# Patient Record
Sex: Female | Born: 1935
Health system: Southern US, Community
[De-identification: ages and names within clinical notes are randomized; demographics above are authoritative.]

## PROBLEM LIST (undated history)

## (undated) ENCOUNTER — Emergency Department (HOSPITAL_COMMUNITY): Discharge status: Absent without leave | Payer: Medicare HMO

## (undated) DIAGNOSIS — I2510 Atherosclerotic heart disease of native coronary artery without angina pectoris: Secondary | ICD-10-CM

## (undated) DIAGNOSIS — N289 Disorder of kidney and ureter, unspecified: Secondary | ICD-10-CM

## (undated) DIAGNOSIS — Z8719 Personal history of other diseases of the digestive system: Secondary | ICD-10-CM

## (undated) DIAGNOSIS — N189 Chronic kidney disease, unspecified: Secondary | ICD-10-CM

## (undated) DIAGNOSIS — I499 Cardiac arrhythmia, unspecified: Secondary | ICD-10-CM

## (undated) DIAGNOSIS — M1990 Unspecified osteoarthritis, unspecified site: Secondary | ICD-10-CM

## (undated) DIAGNOSIS — R42 Dizziness and giddiness: Secondary | ICD-10-CM

## (undated) DIAGNOSIS — E039 Hypothyroidism, unspecified: Secondary | ICD-10-CM

## (undated) DIAGNOSIS — G4730 Sleep apnea, unspecified: Secondary | ICD-10-CM

## (undated) DIAGNOSIS — D509 Iron deficiency anemia, unspecified: Secondary | ICD-10-CM

## (undated) DIAGNOSIS — I10 Essential (primary) hypertension: Secondary | ICD-10-CM

## (undated) DIAGNOSIS — D649 Anemia, unspecified: Secondary | ICD-10-CM

## (undated) DIAGNOSIS — R251 Tremor, unspecified: Secondary | ICD-10-CM

## (undated) DIAGNOSIS — F32A Depression, unspecified: Secondary | ICD-10-CM

## (undated) DIAGNOSIS — F329 Major depressive disorder, single episode, unspecified: Secondary | ICD-10-CM

## (undated) DIAGNOSIS — F419 Anxiety disorder, unspecified: Secondary | ICD-10-CM

## (undated) DIAGNOSIS — R0600 Dyspnea, unspecified: Secondary | ICD-10-CM

## (undated) DIAGNOSIS — K219 Gastro-esophageal reflux disease without esophagitis: Secondary | ICD-10-CM

## (undated) DIAGNOSIS — I219 Acute myocardial infarction, unspecified: Secondary | ICD-10-CM

## (undated) DIAGNOSIS — H9190 Unspecified hearing loss, unspecified ear: Secondary | ICD-10-CM

## (undated) HISTORY — DX: Essential (primary) hypertension: I10

## (undated) HISTORY — PX: OTHER SURGICAL HISTORY: SHX169

## (undated) HISTORY — DX: Iron deficiency anemia, unspecified: D50.9

## (undated) HISTORY — PX: JOINT REPLACEMENT: SHX530

## (undated) HISTORY — PX: REPLACEMENT TOTAL KNEE BILATERAL: SUR1225

## (undated) HISTORY — DX: Anemia, unspecified: D64.9

## (undated) HISTORY — PX: CHOLECYSTECTOMY: SHX55

## (undated) HISTORY — DX: Chronic kidney disease, unspecified: N18.9

---

## 1990-08-19 DIAGNOSIS — I219 Acute myocardial infarction, unspecified: Secondary | ICD-10-CM

## 1990-08-19 HISTORY — DX: Acute myocardial infarction, unspecified: I21.9

## 2004-05-22 ENCOUNTER — Ambulatory Visit: Payer: Self-pay | Admitting: Family Medicine

## 2004-12-20 ENCOUNTER — Ambulatory Visit: Payer: Self-pay | Admitting: General Surgery

## 2005-11-04 ENCOUNTER — Ambulatory Visit: Payer: Self-pay | Admitting: Family Medicine

## 2006-12-17 ENCOUNTER — Inpatient Hospital Stay: Payer: Self-pay | Admitting: Internal Medicine

## 2006-12-17 ENCOUNTER — Other Ambulatory Visit: Payer: Self-pay

## 2007-03-18 ENCOUNTER — Ambulatory Visit: Payer: Self-pay | Admitting: Family Medicine

## 2007-03-30 ENCOUNTER — Ambulatory Visit: Payer: Self-pay | Admitting: Family Medicine

## 2007-08-21 ENCOUNTER — Ambulatory Visit: Payer: Self-pay | Admitting: General Practice

## 2007-09-02 ENCOUNTER — Inpatient Hospital Stay: Payer: Self-pay | Admitting: General Practice

## 2008-02-16 ENCOUNTER — Other Ambulatory Visit: Payer: Self-pay

## 2008-02-16 ENCOUNTER — Emergency Department: Payer: Self-pay | Admitting: Emergency Medicine

## 2008-02-17 ENCOUNTER — Ambulatory Visit: Payer: Self-pay | Admitting: Family Medicine

## 2008-02-18 ENCOUNTER — Ambulatory Visit: Payer: Self-pay | Admitting: Family Medicine

## 2008-03-23 ENCOUNTER — Ambulatory Visit: Payer: Self-pay | Admitting: Family Medicine

## 2008-07-04 ENCOUNTER — Ambulatory Visit: Payer: Self-pay | Admitting: Internal Medicine

## 2008-12-17 ENCOUNTER — Emergency Department: Payer: Self-pay | Admitting: Emergency Medicine

## 2009-03-28 ENCOUNTER — Ambulatory Visit: Payer: Self-pay | Admitting: Family Medicine

## 2009-05-23 ENCOUNTER — Emergency Department: Payer: Self-pay | Admitting: Internal Medicine

## 2009-07-20 ENCOUNTER — Ambulatory Visit: Payer: Self-pay | Admitting: Internal Medicine

## 2009-07-25 ENCOUNTER — Ambulatory Visit: Payer: Self-pay | Admitting: Internal Medicine

## 2009-10-10 ENCOUNTER — Emergency Department: Payer: Self-pay | Admitting: Emergency Medicine

## 2010-01-02 ENCOUNTER — Ambulatory Visit: Payer: Self-pay | Admitting: Internal Medicine

## 2010-01-19 ENCOUNTER — Ambulatory Visit: Payer: Self-pay | Admitting: General Surgery

## 2010-01-31 ENCOUNTER — Ambulatory Visit: Payer: Self-pay | Admitting: Otolaryngology

## 2010-03-27 ENCOUNTER — Inpatient Hospital Stay: Payer: Self-pay | Admitting: Internal Medicine

## 2010-05-09 ENCOUNTER — Encounter: Payer: Self-pay | Admitting: Internal Medicine

## 2010-05-19 ENCOUNTER — Encounter: Payer: Self-pay | Admitting: Internal Medicine

## 2010-05-29 ENCOUNTER — Ambulatory Visit: Payer: Self-pay | Admitting: Internal Medicine

## 2010-06-19 ENCOUNTER — Ambulatory Visit: Payer: Self-pay | Admitting: Internal Medicine

## 2010-06-19 ENCOUNTER — Encounter: Payer: Self-pay | Admitting: Internal Medicine

## 2011-01-31 ENCOUNTER — Ambulatory Visit: Payer: Self-pay | Admitting: Internal Medicine

## 2011-05-21 ENCOUNTER — Encounter: Payer: Self-pay | Admitting: Internal Medicine

## 2011-05-21 ENCOUNTER — Ambulatory Visit (INDEPENDENT_AMBULATORY_CARE_PROVIDER_SITE_OTHER): Payer: 59 | Admitting: Internal Medicine

## 2011-05-21 VITALS — BP 145/84 | HR 71 | Temp 98.5°F | Resp 20 | Ht 72.0 in | Wt 290.5 lb

## 2011-05-21 DIAGNOSIS — F329 Major depressive disorder, single episode, unspecified: Secondary | ICD-10-CM | POA: Insufficient documentation

## 2011-05-21 DIAGNOSIS — H9190 Unspecified hearing loss, unspecified ear: Secondary | ICD-10-CM | POA: Insufficient documentation

## 2011-05-21 DIAGNOSIS — Z0000 Encounter for general adult medical examination without abnormal findings: Secondary | ICD-10-CM

## 2011-05-21 DIAGNOSIS — I10 Essential (primary) hypertension: Secondary | ICD-10-CM

## 2011-05-21 LAB — LIPID PANEL
Cholesterol: 212 mg/dL — ABNORMAL HIGH (ref 0–200)
HDL: 43.6 mg/dL (ref >39.00)
Triglycerides: 112 mg/dL (ref 0.0–149.0)

## 2011-05-21 LAB — CBC WITH DIFFERENTIAL/PLATELET
Eosinophils Absolute: 0.4 10*3/uL (ref 0.0–0.7)
HCT: 35.9 % — ABNORMAL LOW (ref 36.0–46.0)
Lymphs Abs: 1.4 10*3/uL (ref 0.7–4.0)
MCHC: 32.5 g/dL (ref 30.0–36.0)
MCV: 84.1 fl (ref 78.0–100.0)
Monocytes Absolute: 0.5 10*3/uL (ref 0.1–1.0)
Neutrophils Relative %: 60.7 % (ref 43.0–77.0)
Platelets: 206 10*3/uL (ref 150.0–400.0)
RDW: 15.6 % — ABNORMAL HIGH (ref 11.5–14.6)
WBC: 5.8 10*3/uL (ref 4.5–10.5)

## 2011-05-21 LAB — COMPREHENSIVE METABOLIC PANEL
ALT: 13 U/L (ref 0–35)
Calcium: 9.1 mg/dL (ref 8.4–10.5)
GFR: 50.03 mL/min — ABNORMAL LOW (ref >60.00)
Glucose, Bld: 102 mg/dL — ABNORMAL HIGH (ref 70–99)
Potassium: 4.3 mEq/L (ref 3.5–5.1)
Sodium: 144 mEq/L (ref 135–145)
Total Bilirubin: 0.4 mg/dL (ref 0.3–1.2)

## 2011-05-21 MED ORDER — LOSARTAN POTASSIUM 100 MG PO TABS: 100.0000 mg | ORAL_TABLET | Freq: Every day | ORAL | Status: DC

## 2011-05-21 MED ORDER — CITALOPRAM HYDROBROMIDE 10 MG PO TABS: 10.0000 mg | ORAL_TABLET | Freq: Every day | ORAL | Status: DC

## 2011-05-21 NOTE — Patient Instructions (Signed)
Labwork today. Mammogram to be scheduled. Start new medication, Celexa 10mg  daily. Follow up in 1 month or sooner if any concerns.

## 2011-05-21 NOTE — Progress Notes (Signed)
Subjective:    Nicole Bishop is a 77 y.o. female who presents for Medicare Annual/Subsequent preventive examination.  Preventive Screening-Counseling & Management  Tobacco History  Smoking status  . Former Smoker  Smokeless tobacco  . Never Used     Problems Prior to Visit 1. Hypertension  Current Medications (verified) Current Outpatient Prescriptions  Medication Sig Dispense Refill  . amLODipine (NORVASC) 10 MG tablet Take 10 mg by mouth daily.        Marland Kitchen aspirin 81 MG tablet Take 81 mg by mouth every other day.        . esomeprazole (NEXIUM) 40 MG capsule Take 40 mg by mouth daily before breakfast.        . ferrous sulfate 325 (65 FE) MG tablet Take 325 mg by mouth 3 (three) times daily with meals.        . hydrochlorothiazide (HYDRODIURIL) 25 MG tablet Take 25 mg by mouth daily.        Marland Kitchen labetalol (NORMODYNE) 100 MG tablet Take 100 mg by mouth at bedtime. 1/2 tablet at bedtime       . loratadine (CLARITIN) 10 MG tablet Take 10 mg by mouth daily.        Marland Kitchen losartan (COZAAR) 100 MG tablet Take 100 mg by mouth daily.        . mometasone (NASONEX) 50 MCG/ACT nasal spray Place 2 sprays into the nose daily.        Marland Kitchen olopatadine (PATANOL) 0.1 % ophthalmic solution Place 1 drop into both eyes 2 (two) times daily. At 6-8 hours intervals          Allergies (verified) Celebrex and Penicillins cross reactors   PAST HISTORY  Family History Arthritis, hypertension  Social History History  Substance Use Topics  . Smoking status: Former Research scientist (life sciences)  . Smokeless tobacco: Never Used  . Alcohol Use: No     Are there smokers in your home (other than you)? No  Risk Factors Current exercise habits: The patient does not participate in regular exercise at present.  Dietary issues discussed: regular diet, increased fruits and vegetables   Cardiac risk factors: advanced age (older than 31 for men, 59 for women), hypertension and sedentary lifestyle.  Depression Screen (Note: if answer to  either of the following is "Yes", a more complete depression screening is indicated)   Over the past two weeks, have you felt down, depressed or hopeless? Yes  Over the past two weeks, have you felt little interest or pleasure in doing things? Yes  Have you lost interest or pleasure in daily life? Yes  Do you often feel hopeless? Yes  Do you cry easily over simple problems? No  Activities of Daily Living In your present state of health, do you have any difficulty performing the following activities?:  Driving? Yes Managing money?  No Feeding yourself? No Getting from bed to chair? No Climbing a flight of stairs? No Preparing food and eating?: No Bathing or showering? No Getting dressed: No Getting to the toilet? No Using the toilet:No Moving around from place to place: No In the past year have you fallen or had a near fall?:Yes, once in last year   Are you sexually active?  No  Hearing Difficulties: Yes Do you often ask people to speak up or repeat themselves? Yes Do you experience ringing or noises in your ears? No Do you have difficulty understanding soft or whispered voices? Yes   Do you feel that you have a problem  with memory? Yes  Do you often misplace items? No  Do you feel safe at home?  Yes  Cognitive Testing  Alert? Yes  Normal Appearance?Yes  Oriented to person? Yes  Place? Yes   Time? Yes  Recall of three objects?  Yes  Can perform simple calculations? Yes  Displays appropriate judgment?Yes  Can read the correct time from a watch face?Yes   Advanced Directives have been discussed with the patient? Yes    All answers were reviewed with the patient and necessary referrals were made: audiology for hearing testing, offered PT for strength training but pt refused.  Jackolyn Confer, MD   05/21/2011   History reviewed: allergies, current medications, past family history, past medical history, past social history, past surgical history and problem list  Review  of Systems A comprehensive review of systems was negative except for: Behavioral/Psych: positive for depression  and mild fatigue, which has been longstanding.   Objective:       Body mass index is 39.40 kg/(m^2). BP 145/84  Pulse 71  Temp(Src) 98.5 F (36.9 C) (Oral)  Resp 20  Ht 6' (1.829 m)  Wt 290 lb 8 oz (131.77 kg)  BMI 39.40 kg/m2  SpO2 94%    General Appearance:    Alert, cooperative, no distress, appears stated age  Head:    Normocephalic, without obvious abnormality, atraumatic  Eyes:    PERRL, conjunctiva/corneas clear, EOM's intact, fundi    benign, both eyes  Ears:    Normal TM's and external ear canals, both ears  Nose:   Nares normal, septum midline, mucosa normal, no drainage    or sinus tenderness  Throat:   Lips, mucosa, and tongue normal; teeth and gums normal  Neck:   Supple, symmetrical, trachea midline, no adenopathy;    thyroid:  no enlargement/tenderness/nodules; no carotid   bruit or JVD  Back:     Symmetric, no curvature, ROM normal, no CVA tenderness  Lungs:     Clear to auscultation bilaterally, respirations unlabored  Chest Wall:    No tenderness or deformity   Heart:    Regular rate and rhythm, S1 and S2 normal, no murmur, rub   or gallop  Breast Exam:    No tenderness, masses, or nipple abnormality  Abdomen:     Soft, non-tender, bowel sounds active all four quadrants,    no masses, no organomegaly        Extremities:   Extremities normal, atraumatic, no cyanosis or edema  Pulses:   2+ and symmetric all extremities  Skin:   Skin color, texture, turgor normal, no rashes or lesions  Lymph nodes:   Cervical, supraclavicular, and axillary nodes normal  Neurologic:   CNII-XII intact, normal strength, sensation and reflexes    throughout       Assessment and Plan:     1. Hypertension - BP fairly well controlled historically, slightly elevated today. No change in meds today. Will check renal function with labs today. Follow up in 1  month.  2. Depression - Identified on screen. Will start Celexa 10mg  daily. RTC in 80month.  3. Hearing loss - Identified on screen. Will refer to audiology for hearing testing.  4. Health Maintenance - Influenza vaccine refused today. Will check lipids, CBC,CMP with labs.     Diet review for nutrition referral?  Not Indicated    Patient Instructions (the written plan) was given to the patient.  Medicare Attestation I have personally reviewed: The patient's medical and social history  Their use of alcohol, tobacco or illicit drugs Their current medications and supplements The patient's functional ability including ADLs,fall risks, home safety risks, cognitive, and hearing and visual impairment Diet and physical activities Evidence for depression or mood disorders  The patient's weight, height, BMI, and visual acuity have been recorded in the chart.  I have made referrals, counseling, and provided education to the patient based on review of the above and I have provided the patient with a written personalized care plan for preventive services.     Jackolyn Confer, MD   05/21/2011

## 2011-06-24 ENCOUNTER — Ambulatory Visit: Payer: 59 | Admitting: Internal Medicine

## 2011-07-04 ENCOUNTER — Ambulatory Visit: Payer: Self-pay | Admitting: Internal Medicine

## 2011-07-08 ENCOUNTER — Encounter: Payer: Self-pay | Admitting: Internal Medicine

## 2011-07-20 ENCOUNTER — Encounter: Payer: Self-pay | Admitting: Internal Medicine

## 2011-08-14 ENCOUNTER — Ambulatory Visit: Payer: Self-pay | Admitting: Internal Medicine

## 2011-08-15 ENCOUNTER — Encounter: Payer: Self-pay | Admitting: Internal Medicine

## 2011-08-20 ENCOUNTER — Encounter: Payer: Self-pay | Admitting: Internal Medicine

## 2011-08-29 ENCOUNTER — Ambulatory Visit: Payer: Self-pay | Admitting: Physical Medicine and Rehabilitation

## 2011-10-31 DIAGNOSIS — E782 Mixed hyperlipidemia: Secondary | ICD-10-CM | POA: Insufficient documentation

## 2011-10-31 DIAGNOSIS — D649 Anemia, unspecified: Secondary | ICD-10-CM | POA: Insufficient documentation

## 2011-11-04 DIAGNOSIS — M549 Dorsalgia, unspecified: Secondary | ICD-10-CM | POA: Insufficient documentation

## 2011-11-04 DIAGNOSIS — E559 Vitamin D deficiency, unspecified: Secondary | ICD-10-CM | POA: Insufficient documentation

## 2011-11-08 ENCOUNTER — Other Ambulatory Visit: Payer: Self-pay | Admitting: *Deleted

## 2011-12-03 ENCOUNTER — Other Ambulatory Visit: Payer: Self-pay | Admitting: *Deleted

## 2011-12-03 MED ORDER — LABETALOL HCL 100 MG PO TABS: 50.0000 mg | ORAL_TABLET | Freq: Every day | ORAL | Status: DC

## 2012-01-24 ENCOUNTER — Other Ambulatory Visit: Payer: Self-pay | Admitting: Internal Medicine

## 2012-01-24 MED ORDER — AMLODIPINE BESYLATE 10 MG PO TABS: 10.0000 mg | ORAL_TABLET | Freq: Every day | ORAL | Status: DC

## 2012-03-13 DIAGNOSIS — R42 Dizziness and giddiness: Secondary | ICD-10-CM | POA: Insufficient documentation

## 2012-03-17 ENCOUNTER — Ambulatory Visit: Payer: Self-pay | Admitting: Internal Medicine

## 2012-04-17 ENCOUNTER — Encounter: Payer: Self-pay | Admitting: Otolaryngology

## 2012-04-19 ENCOUNTER — Encounter: Payer: Self-pay | Admitting: Otolaryngology

## 2012-05-19 ENCOUNTER — Encounter: Payer: Self-pay | Admitting: Otolaryngology

## 2012-06-26 ENCOUNTER — Other Ambulatory Visit: Payer: Self-pay | Admitting: *Deleted

## 2012-06-26 NOTE — Telephone Encounter (Signed)
Last OV 10/12, please advise.

## 2012-06-28 MED ORDER — AMLODIPINE BESYLATE 10 MG PO TABS: 10.0000 mg | ORAL_TABLET | Freq: Every day | ORAL | Status: DC

## 2012-07-21 ENCOUNTER — Other Ambulatory Visit: Payer: Self-pay | Admitting: General Practice

## 2012-07-21 NOTE — Telephone Encounter (Signed)
Labetalol HCL refill request. Last filled on 4/16. Pt last seen on 05/21/11. Please advise.

## 2012-07-21 NOTE — Telephone Encounter (Signed)
Has she been taking this med? We can fill x 1 month. Needs follow up

## 2012-07-22 MED ORDER — LABETALOL HCL 100 MG PO TABS: 50.0000 mg | ORAL_TABLET | Freq: Every day | ORAL | Status: DC

## 2012-07-22 NOTE — Telephone Encounter (Signed)
Med filled.  

## 2012-08-04 ENCOUNTER — Other Ambulatory Visit: Payer: Self-pay | Admitting: Internal Medicine

## 2012-08-04 NOTE — Telephone Encounter (Signed)
Amlodipine Besylate 10 mg  Take one tablet by mouth every day  # 30

## 2012-08-05 MED ORDER — AMLODIPINE BESYLATE 10 MG PO TABS: 10.0000 mg | ORAL_TABLET | Freq: Every day | ORAL | Status: DC

## 2012-08-05 NOTE — Telephone Encounter (Signed)
We can refill x1 month. Needs follow up appt.

## 2012-08-05 NOTE — Telephone Encounter (Signed)
Med filled.  

## 2012-08-05 NOTE — Telephone Encounter (Signed)
Med last filled on 06/26/12 qty 30 w/ no refills. Pt has not bee seen since 05/31/11. Please advise.

## 2012-09-08 ENCOUNTER — Other Ambulatory Visit: Payer: Self-pay | Admitting: Internal Medicine

## 2012-09-08 MED ORDER — AMLODIPINE BESYLATE 10 MG PO TABS: 10.0000 mg | ORAL_TABLET | Freq: Every day | ORAL | Status: DC

## 2012-09-08 NOTE — Telephone Encounter (Signed)
Rx refilled.

## 2012-09-08 NOTE — Telephone Encounter (Signed)
Refill on Amlodipine Besylate 10 mg tab #30.

## 2012-09-30 ENCOUNTER — Other Ambulatory Visit: Payer: Self-pay | Admitting: *Deleted

## 2012-10-13 ENCOUNTER — Other Ambulatory Visit: Payer: Self-pay | Admitting: *Deleted

## 2012-11-06 ENCOUNTER — Other Ambulatory Visit: Payer: Self-pay | Admitting: *Deleted

## 2012-11-06 NOTE — Telephone Encounter (Signed)
Patient has not been seen 2012

## 2012-11-06 NOTE — Telephone Encounter (Signed)
Pt needs to be seen prior to refills. No office visits since 2012.

## 2012-11-13 ENCOUNTER — Other Ambulatory Visit: Payer: Self-pay | Admitting: *Deleted

## 2012-11-25 ENCOUNTER — Other Ambulatory Visit: Payer: Self-pay | Admitting: *Deleted

## 2012-11-25 NOTE — Telephone Encounter (Signed)
Pt needs an appt. Has not been seen since 2012.

## 2012-12-08 ENCOUNTER — Other Ambulatory Visit: Payer: Self-pay | Admitting: *Deleted

## 2012-12-08 ENCOUNTER — Ambulatory Visit: Payer: Self-pay | Admitting: Internal Medicine

## 2012-12-08 NOTE — Telephone Encounter (Signed)
No. Pt must have follow up

## 2012-12-08 NOTE — Telephone Encounter (Signed)
Patient has not been seen since 2012

## 2012-12-09 ENCOUNTER — Other Ambulatory Visit: Payer: Self-pay | Admitting: *Deleted

## 2012-12-10 ENCOUNTER — Ambulatory Visit: Payer: Self-pay | Admitting: Otolaryngology

## 2012-12-17 ENCOUNTER — Other Ambulatory Visit: Payer: Self-pay | Admitting: *Deleted

## 2012-12-29 ENCOUNTER — Other Ambulatory Visit: Payer: Self-pay | Admitting: *Deleted

## 2013-01-13 ENCOUNTER — Other Ambulatory Visit: Payer: Self-pay | Admitting: *Deleted

## 2013-02-04 ENCOUNTER — Other Ambulatory Visit: Payer: Self-pay | Admitting: Internal Medicine

## 2013-02-12 MED ORDER — LORATADINE 10 MG PO TABS: 10.0000 mg | ORAL_TABLET | Freq: Every day | ORAL | Status: DC

## 2013-02-12 NOTE — Addendum Note (Signed)
Addended by: Ronaldo Miyamoto on: 02/12/2013 02:41 PM   Modules accepted: Orders

## 2013-05-26 ENCOUNTER — Ambulatory Visit: Payer: Self-pay | Admitting: Otolaryngology

## 2013-05-31 ENCOUNTER — Other Ambulatory Visit: Payer: Self-pay | Admitting: *Deleted

## 2013-05-31 DIAGNOSIS — I10 Essential (primary) hypertension: Secondary | ICD-10-CM

## 2013-07-02 ENCOUNTER — Telehealth: Payer: Self-pay | Admitting: Internal Medicine

## 2013-07-02 NOTE — Telephone Encounter (Signed)
Pharmacy states pt continues to contact them regarding refills for losartan.  Pharmacy states they have faxed request several times with no response.  Pharmacy asking if pt is seen here by Dr. Gilford Rile as pt seems to be unclear.  Pharmacy asking for a call.

## 2013-07-05 NOTE — Telephone Encounter (Signed)
Spoke with pharmacist and informed her Nicole Bishop has not been seen here since 2012.

## 2013-12-27 DIAGNOSIS — E538 Deficiency of other specified B group vitamins: Secondary | ICD-10-CM | POA: Insufficient documentation

## 2014-01-27 DIAGNOSIS — M19019 Primary osteoarthritis, unspecified shoulder: Secondary | ICD-10-CM | POA: Insufficient documentation

## 2014-02-21 ENCOUNTER — Ambulatory Visit: Payer: Self-pay | Admitting: Internal Medicine

## 2014-04-04 ENCOUNTER — Other Ambulatory Visit: Payer: Self-pay | Admitting: Internal Medicine

## 2014-04-20 DIAGNOSIS — Z8639 Personal history of other endocrine, nutritional and metabolic disease: Secondary | ICD-10-CM | POA: Insufficient documentation

## 2014-04-20 DIAGNOSIS — K219 Gastro-esophageal reflux disease without esophagitis: Secondary | ICD-10-CM | POA: Insufficient documentation

## 2014-04-20 DIAGNOSIS — J45909 Unspecified asthma, uncomplicated: Secondary | ICD-10-CM | POA: Insufficient documentation

## 2014-05-03 DIAGNOSIS — I10 Essential (primary) hypertension: Secondary | ICD-10-CM | POA: Insufficient documentation

## 2014-06-03 DIAGNOSIS — K5909 Other constipation: Secondary | ICD-10-CM | POA: Insufficient documentation

## 2014-09-12 ENCOUNTER — Ambulatory Visit: Payer: Self-pay | Admitting: Hematology and Oncology

## 2014-09-12 LAB — IRON AND TIBC
Iron Bind.Cap.(Total): 487 ug/dL — ABNORMAL HIGH (ref 250–450)
Iron Saturation: 5 %
Iron: 23 ug/dL — ABNORMAL LOW (ref 50–170)
Unbound Iron-Bind.Cap.: 464 ug/dL

## 2014-09-12 LAB — FERRITIN: Ferritin (ARMC): 6 ng/mL — ABNORMAL LOW (ref 8–388)

## 2014-09-12 LAB — RETICULOCYTES
Absolute Retic Count: 0.0837 10*6/uL (ref 0.019–0.186)
Reticulocyte: 1.87 % (ref 0.4–3.1)

## 2014-09-12 LAB — CBC CANCER CENTER
Eosinophil: 1 %
HCT: 30 % — ABNORMAL LOW (ref 35.0–47.0)
HGB: 8.8 g/dL — ABNORMAL LOW (ref 12.0–16.0)
Lymphocytes: 27 %
MCH: 19.7 pg — ABNORMAL LOW (ref 26.0–34.0)
MCHC: 29.3 g/dL — ABNORMAL LOW (ref 32.0–36.0)
MCV: 67 fL — ABNORMAL LOW (ref 80–100)
Monocytes: 6 %
Platelet: 297 x10 3/mm (ref 150–440)
RBC: 4.47 10*6/uL (ref 3.80–5.20)
RDW: 18.5 % — ABNORMAL HIGH (ref 11.5–14.5)
Segmented Neutrophils: 66 %
WBC: 5 x10 3/mm (ref 3.6–11.0)

## 2014-09-12 LAB — FOLATE: Folic Acid: 16.7 ng/mL (ref 3.1–17.5)

## 2014-09-13 LAB — PROT IMMUNOELECTROPHORES(ARMC)

## 2014-09-19 ENCOUNTER — Ambulatory Visit: Payer: Self-pay | Admitting: Hematology and Oncology

## 2014-10-21 ENCOUNTER — Ambulatory Visit
Admit: 2014-10-21 | Disposition: A | Discharge status: Home / Self Care | Payer: Self-pay | Attending: Hematology and Oncology | Admitting: Hematology and Oncology

## 2014-11-18 ENCOUNTER — Ambulatory Visit
Admit: 2014-11-18 | Disposition: A | Discharge status: Home / Self Care | Payer: Self-pay | Attending: Hematology and Oncology | Admitting: Hematology and Oncology

## 2014-11-21 LAB — CBC CANCER CENTER
Basophil #: 0.1 x10 3/mm (ref 0.0–0.1)
Basophil %: 1.3 %
Eosinophil #: 0.1 x10 3/mm (ref 0.0–0.7)
Eosinophil %: 2.2 %
HCT: 34.7 % — ABNORMAL LOW (ref 35.0–47.0)
HGB: 10.7 g/dL — ABNORMAL LOW (ref 12.0–16.0)
Lymphocyte #: 1.1 x10 3/mm (ref 1.0–3.6)
Lymphocyte %: 25.1 %
MCH: 22.3 pg — ABNORMAL LOW (ref 26.0–34.0)
MCHC: 30.7 g/dL — ABNORMAL LOW (ref 32.0–36.0)
MCV: 73 fL — ABNORMAL LOW (ref 80–100)
Monocyte #: 0.4 x10 3/mm (ref 0.2–0.9)
Monocyte %: 8.2 %
Neutrophil #: 2.8 x10 3/mm (ref 1.4–6.5)
Neutrophil %: 63.2 %
Platelet: 196 x10 3/mm (ref 150–440)
RBC: 4.78 10*6/uL (ref 3.80–5.20)
RDW: 27.6 % — ABNORMAL HIGH (ref 11.5–14.5)
WBC: 4.4 x10 3/mm (ref 3.6–11.0)

## 2014-11-21 LAB — COMPREHENSIVE METABOLIC PANEL
Albumin: 3.9 g/dL
Alkaline Phosphatase: 74 U/L
Anion Gap: 7 (ref 7–16)
BUN: 15 mg/dL
Bilirubin,Total: 0.6 mg/dL
Calcium, Total: 9 mg/dL
Chloride: 105 mmol/L
Co2: 23 mmol/L
Creatinine: 1.08 mg/dL — ABNORMAL HIGH
EGFR (African American): 56 — ABNORMAL LOW
EGFR (Non-African Amer.): 48 — ABNORMAL LOW
Glucose: 102 mg/dL — ABNORMAL HIGH
Potassium: 4.1 mmol/L
SGOT(AST): 18 U/L
SGPT (ALT): 13 U/L — ABNORMAL LOW
Sodium: 135 mmol/L
Total Protein: 7.1 g/dL

## 2014-11-21 LAB — IRON AND TIBC
Iron Bind.Cap.(Total): 358 (ref 250–450)
Iron Saturation: 12.6
Iron: 45 ug/dL
Unbound Iron-Bind.Cap.: 313.1

## 2014-11-21 LAB — PROTIME-INR
INR: 1.1
Prothrombin Time: 14.3 secs

## 2014-11-21 LAB — APTT: Activated PTT: 26.9 secs (ref 23.6–35.9)

## 2014-11-21 LAB — FERRITIN: Ferritin (ARMC): 92 ng/mL

## 2014-11-30 ENCOUNTER — Ambulatory Visit
Admit: 2014-11-30 | Disposition: A | Discharge status: Home / Self Care | Payer: Self-pay | Attending: Internal Medicine | Admitting: Internal Medicine

## 2014-12-14 ENCOUNTER — Other Ambulatory Visit: Payer: Self-pay

## 2014-12-14 DIAGNOSIS — D509 Iron deficiency anemia, unspecified: Secondary | ICD-10-CM

## 2014-12-14 HISTORY — DX: Iron deficiency anemia, unspecified: D50.9

## 2014-12-21 ENCOUNTER — Other Ambulatory Visit: Payer: Medicaid Other

## 2014-12-22 DIAGNOSIS — D509 Iron deficiency anemia, unspecified: Secondary | ICD-10-CM | POA: Insufficient documentation

## 2014-12-22 DIAGNOSIS — N1831 Chronic kidney disease, stage 3a: Secondary | ICD-10-CM | POA: Insufficient documentation

## 2014-12-22 DIAGNOSIS — N183 Chronic kidney disease, stage 3 unspecified: Secondary | ICD-10-CM | POA: Insufficient documentation

## 2014-12-28 ENCOUNTER — Encounter (INDEPENDENT_AMBULATORY_CARE_PROVIDER_SITE_OTHER): Payer: Self-pay

## 2014-12-28 ENCOUNTER — Inpatient Hospital Stay: Payer: Medicare Other | Attending: Hematology and Oncology

## 2014-12-28 DIAGNOSIS — Z79899 Other long term (current) drug therapy: Secondary | ICD-10-CM | POA: Diagnosis not present

## 2014-12-28 DIAGNOSIS — D509 Iron deficiency anemia, unspecified: Secondary | ICD-10-CM | POA: Diagnosis present

## 2014-12-28 DIAGNOSIS — N183 Chronic kidney disease, stage 3 (moderate): Secondary | ICD-10-CM | POA: Insufficient documentation

## 2014-12-28 LAB — CBC WITH DIFFERENTIAL/PLATELET
Basophils Absolute: 0.1 10*3/uL (ref 0–0.1)
Basophils Relative: 1 %
Eosinophils Absolute: 0.1 10*3/uL (ref 0–0.7)
Eosinophils Relative: 2 %
HCT: 32.3 % — ABNORMAL LOW (ref 35.0–47.0)
Hemoglobin: 10.1 g/dL — ABNORMAL LOW (ref 12.0–16.0)
Lymphocytes Relative: 31 %
Lymphs Abs: 1.5 10*3/uL (ref 1.0–3.6)
MCH: 23.6 pg — ABNORMAL LOW (ref 26.0–34.0)
MCHC: 31.1 g/dL — ABNORMAL LOW (ref 32.0–36.0)
MCV: 75.7 fL — ABNORMAL LOW (ref 80.0–100.0)
Monocytes Absolute: 0.5 10*3/uL (ref 0.2–0.9)
Monocytes Relative: 9 %
Neutro Abs: 2.7 10*3/uL (ref 1.4–6.5)
Neutrophils Relative %: 57 %
Platelets: 209 10*3/uL (ref 150–440)
RBC: 4.27 MIL/uL (ref 3.80–5.20)
RDW: 24.4 % — ABNORMAL HIGH (ref 11.5–14.5)
WBC: 4.8 10*3/uL (ref 3.6–11.0)

## 2014-12-28 LAB — FERRITIN: Ferritin: 14 ng/mL (ref 11–307)

## 2015-02-22 ENCOUNTER — Other Ambulatory Visit: Payer: Self-pay

## 2015-02-22 DIAGNOSIS — D509 Iron deficiency anemia, unspecified: Secondary | ICD-10-CM

## 2015-02-23 ENCOUNTER — Inpatient Hospital Stay (HOSPITAL_BASED_OUTPATIENT_CLINIC_OR_DEPARTMENT_OTHER): Payer: Medicare Other | Admitting: Hematology and Oncology

## 2015-02-23 ENCOUNTER — Encounter: Payer: Self-pay | Admitting: Hematology and Oncology

## 2015-02-23 ENCOUNTER — Inpatient Hospital Stay: Payer: Medicare Other | Attending: Hematology and Oncology

## 2015-02-23 VITALS — BP 127/75 | HR 71 | Temp 96.1°F | Ht 73.0 in | Wt 275.6 lb

## 2015-02-23 DIAGNOSIS — D509 Iron deficiency anemia, unspecified: Secondary | ICD-10-CM

## 2015-02-23 DIAGNOSIS — Z79899 Other long term (current) drug therapy: Secondary | ICD-10-CM | POA: Insufficient documentation

## 2015-02-23 DIAGNOSIS — N183 Chronic kidney disease, stage 3 (moderate): Secondary | ICD-10-CM

## 2015-02-23 DIAGNOSIS — I129 Hypertensive chronic kidney disease with stage 1 through stage 4 chronic kidney disease, or unspecified chronic kidney disease: Secondary | ICD-10-CM | POA: Diagnosis not present

## 2015-02-23 LAB — CBC WITH DIFFERENTIAL/PLATELET
Basophils Absolute: 0 10*3/uL (ref 0–0.1)
Basophils Relative: 1 %
Eosinophils Absolute: 0.1 10*3/uL (ref 0–0.7)
Eosinophils Relative: 2 %
HCT: 34.9 % — ABNORMAL LOW (ref 35.0–47.0)
Hemoglobin: 10.9 g/dL — ABNORMAL LOW (ref 12.0–16.0)
Lymphocytes Relative: 33 %
Lymphs Abs: 1.5 10*3/uL (ref 1.0–3.6)
MCH: 24.3 pg — ABNORMAL LOW (ref 26.0–34.0)
MCHC: 31.2 g/dL — ABNORMAL LOW (ref 32.0–36.0)
MCV: 77.9 fL — ABNORMAL LOW (ref 80.0–100.0)
Monocytes Absolute: 0.4 10*3/uL (ref 0.2–0.9)
Monocytes Relative: 8 %
Neutro Abs: 2.5 10*3/uL (ref 1.4–6.5)
Neutrophils Relative %: 56 %
Platelets: 234 10*3/uL (ref 150–440)
RBC: 4.48 MIL/uL (ref 3.80–5.20)
RDW: 16.6 % — ABNORMAL HIGH (ref 11.5–14.5)
WBC: 4.5 10*3/uL (ref 3.6–11.0)

## 2015-02-23 LAB — IRON AND TIBC
Iron: 29 ug/dL (ref 28–170)
Saturation Ratios: 6 % — ABNORMAL LOW (ref 10.4–31.8)
TIBC: 463 ug/dL — ABNORMAL HIGH (ref 250–450)
UIBC: 434 ug/dL

## 2015-02-23 LAB — FERRITIN: Ferritin: 9 ng/mL — ABNORMAL LOW (ref 11–307)

## 2015-02-23 NOTE — Progress Notes (Signed)
Pt here today for follow up appointment; states she feels dizzy when she eats otherwise no other complaints

## 2015-02-23 NOTE — Progress Notes (Signed)
[facility] day:  02/23/2015  Chief Complaint: Nicole Bishop is a 81 y.o. female with stage III chronic kidney disease and iron deficiency who is seen for 3 month assessment.  HPI: The patient was last seen in the medical oncology clinic on 11/21/2014.  At that time, she felt "pretty good".  Hematocrit had improved to 34.7.  Ferritin was 92.  Exam revealed only ankle edema.  During the interim, she has felt "ok".  She still feels weak and tired.  She is eating more iron rich foods.   Past Medical History  Diagnosis Date  . Hypertension   . Chronic kidney disease   . Anemia   . IDA (iron deficiency anemia) 12/14/2014    Past Surgical History  Procedure Laterality Date  . Replacement total knee bilateral Bilateral   . Gallbladdedr removed    . Fatty tumor removed on left shoulder      Family History  Problem Relation Age of Onset  . Arthritis Mother   . Hypertension Mother   . Hypertension Father     Social History:  reports that she has quit smoking. She has never used smokeless tobacco. She reports that she does not drink alcohol or use illicit drugs.  The patient is accompanied by her daughter, Nicole Bishop, today.  Allergies:  Allergies  Allergen Reactions  . Penicillins Rash  . Celebrex [Celecoxib] Other (See Comments)    constipation  . Felodipine Nausea Only  . Oxaprozin Nausea Only  . Penicillins Cross Reactors   . Rofecoxib Other (See Comments)    GI upset  . Atorvastatin Rash    Current Medications: Current Outpatient Prescriptions  Medication Sig Dispense Refill  . amLODipine (NORVASC) 10 MG tablet Take 1 tablet (10 mg total) by mouth daily. Pt needs OV for more refills. 30 tablet 0  . aspirin 81 MG tablet Take 81 mg by mouth every other day.      . docusate sodium (COLACE) 100 MG capsule Take by mouth.    . esomeprazole (NEXIUM) 40 MG capsule Take 40 mg by mouth daily before breakfast.      . hydrochlorothiazide  (HYDRODIURIL) 25 MG tablet Take 25 mg by mouth daily.      Marland Kitchen labetalol (NORMODYNE) 100 MG tablet Take 0.5 tablets (50 mg total) by mouth at bedtime. 15 tablet 0  . loratadine (CLARITIN) 10 MG tablet Take 1 tablet (10 mg total) by mouth daily. 30 tablet 0  . losartan (COZAAR) 100 MG tablet Take 1 tablet (100 mg total) by mouth daily. 30 tablet 11  . mometasone (NASONEX) 50 MCG/ACT nasal spray Place 2 sprays into the nose daily.      Marland Kitchen olopatadine (PATANOL) 0.1 % ophthalmic solution Place 1 drop into both eyes 2 (two) times daily. At 6-8 hours intervals     . Cholecalciferol (VITAMIN D3) 1000 UNITS CAPS Take by mouth.    . citalopram (CELEXA) 10 MG tablet Take 1 tablet (10 mg total) by mouth daily. 30 tablet 2  . ferrous sulfate 325 (65 FE) MG tablet Take 325 mg by mouth 3 (three) times daily with meals.       No current facility-administered medications for this visit.    Review of Systems:  GENERAL:  Feels "ok".  Still tired.  No fevers, sweats or weight loss. PERFORMANCE STATUS (ECOG):  1 HEENT:  No visual changes, runny nose, sore throat, mouth sores or tenderness. Lungs: No shortness of breath or  cough.  No hemoptysis. Cardiac:  No chest pain, palpitations, orthopnea, or PND. GI:  No nausea, vomiting, diarrhea, constipation, melena or hematochezia. GU:  No urgency, frequency, dysuria, or hematuria. Musculoskeletal:  No back pain.  No joint pain.  No muscle tenderness. Extremities:  No pain or swelling. Skin:  No rashes or skin changes. Neuro:  No headache, numbness or weakness, balance or coordination issues. Endocrine:  No diabetes, thyroid issues, hot flashes or night sweats. Psych:  No mood changes, depression or anxiety. Pain:  No focal pain. Review of systems:  All other systems reviewed and found to be negative.   Physical Exam: Blood pressure 127/75, pulse 71, temperature 96.1 F (35.6 C), temperature source Tympanic, height 6\' 1"  (1.854 m), weight 275 lb 9.2 oz (125  kg). GENERAL:  Well developed, well nourished, sitting comfortably in the exam room in no acute distress. MENTAL STATUS:  Alert and oriented to person, place and time. HEAD:  Shoulder length gray hair.  Normocephalic, atraumatic, face symmetric, no Cushingoid features. EYES:  Brown eyes.  Bilateral arcus.  Pupils equal round and reactive to light and accomodation.  No conjunctivitis or scleral icterus. ENT:  Oropharynx clear without lesion.  Tongue normal. Mucous membranes moist.  RESPIRATORY:  Clear to auscultation without rales, wheezes or rhonchi. CARDIOVASCULAR:  Regular rate and rhythm without murmur, rub or gallop. ABDOMEN:  Soft, non-tender, with active bowel sounds, and no hepatosplenomegaly.  No masses. SKIN:  No rashes, ulcers or lesions. EXTREMITIES: No edema, no skin discoloration or tenderness.  No palpable cords. LYMPH NODES: No palpable cervical, supraclavicular, axillary or inguinal adenopathy  NEUROLOGICAL: Unremarkable. PSYCH:  Appropriate.   Appointment on 02/23/2015  Component Date Value Ref Range Status  . WBC 02/23/2015 4.5  3.6 - 11.0 K/uL Final  . RBC 02/23/2015 4.48  3.80 - 5.20 MIL/uL Final  . Hemoglobin 02/23/2015 10.9* 12.0 - 16.0 g/dL Final  . HCT 02/23/2015 34.9* 35.0 - 47.0 % Final  . MCV 02/23/2015 77.9* 80.0 - 100.0 fL Final  . MCH 02/23/2015 24.3* 26.0 - 34.0 pg Final  . MCHC 02/23/2015 31.2* 32.0 - 36.0 g/dL Final  . RDW 02/23/2015 16.6* 11.5 - 14.5 % Final  . Platelets 02/23/2015 234  150 - 440 K/uL Final  . Neutrophils Relative % 02/23/2015 56   Final  . Neutro Abs 02/23/2015 2.5  1.4 - 6.5 K/uL Final  . Lymphocytes Relative 02/23/2015 33   Final  . Lymphs Abs 02/23/2015 1.5  1.0 - 3.6 K/uL Final  . Monocytes Relative 02/23/2015 8   Final  . Monocytes Absolute 02/23/2015 0.4  0.2 - 0.9 K/uL Final  . Eosinophils Relative 02/23/2015 2   Final  . Eosinophils Absolute 02/23/2015 0.1  0 - 0.7 K/uL Final  . Basophils Relative 02/23/2015 1   Final  .  Basophils Absolute 02/23/2015 0.0  0 - 0.1 K/uL Final    Assessment:  Nicole Bishop is a 81 y.o. African American woman with stage III chronic kidney disease and iron deficiency.  Hematocrit and MCV were normal on 09/03/2013.    She denies any GI bleeding.  Her diet is modest.  Her last colonoscopy was < 5 years ago and was notable for numerous polyps.  She has a history of a gastric ulcer when she was young.  Stool guaiacs are pending.  She has been on B12 supplimentation for 3-6 months.  She is intolerant of oral iron secondary to baseline chronic constipation.   Labs on 09/12/2014 revealed  a hematocrit of 30 with an MCV of 67. Ferritin was 6 (low) with a TIBC of 487 (high).  Normal studies included direct Coombs, folic acid, 123456, haptoglobin, erythropoietin, and SPEP.  She received weekly Venofer x 4 (10/21/2014 - 11/11/2014).  Symptomatically, she is fatigued.  Hematocrit is 34.9 with a ferritin of 9, TIBC 463 (high) and an iron saturation of 6% (low) consistent with iron deficiency.  Plan: 1. Labs today:  CBC with diff, ferritin, iron studies.   2. Preauth Venofer. 3. RTC weekly for Venofer x 4 4. RTC in 4 weeks for MD assess, labs (CBC with diff, ferritin) +/- Venofer.   Lequita Asal, MD  02/23/2015, 10:55 AM

## 2015-02-24 ENCOUNTER — Inpatient Hospital Stay: Payer: Medicare Other

## 2015-02-24 VITALS — BP 144/70 | HR 69

## 2015-02-24 DIAGNOSIS — D509 Iron deficiency anemia, unspecified: Secondary | ICD-10-CM | POA: Diagnosis not present

## 2015-02-24 MED ORDER — SODIUM CHLORIDE 0.9 % IV SOLN
200.0000 mg | Freq: Once | INTRAVENOUS | Status: AC
  Administered 2015-02-24: 200 mg via INTRAVENOUS
  Filled 2015-02-24: qty 10

## 2015-02-24 MED ORDER — SODIUM CHLORIDE 0.9 % IV SOLN
INTRAVENOUS | Status: DC
Start: 2015-02-24 — End: 2015-02-24
  Administered 2015-02-24: 10:00:00 via INTRAVENOUS
  Filled 2015-02-24: qty 1000

## 2015-03-03 ENCOUNTER — Ambulatory Visit: Payer: Medicare Other

## 2015-03-03 ENCOUNTER — Inpatient Hospital Stay: Payer: Medicare Other

## 2015-03-03 VITALS — BP 122/74 | HR 69

## 2015-03-03 DIAGNOSIS — D509 Iron deficiency anemia, unspecified: Secondary | ICD-10-CM

## 2015-03-03 MED ORDER — SODIUM CHLORIDE 0.9 % IV SOLN
200.0000 mg | Freq: Once | INTRAVENOUS | Status: AC
  Administered 2015-03-03: 200 mg via INTRAVENOUS
  Filled 2015-03-03: qty 10

## 2015-03-03 MED ORDER — SODIUM CHLORIDE 0.9 % IV SOLN
Freq: Once | INTRAVENOUS | Status: AC
  Administered 2015-03-03: 09:00:00 via INTRAVENOUS
  Filled 2015-03-03: qty 1000

## 2015-03-07 ENCOUNTER — Other Ambulatory Visit: Payer: Self-pay | Admitting: Internal Medicine

## 2015-03-10 ENCOUNTER — Inpatient Hospital Stay: Payer: Medicare Other

## 2015-03-10 ENCOUNTER — Ambulatory Visit: Payer: Medicare Other

## 2015-03-10 VITALS — BP 124/78 | HR 72 | Temp 97.1°F | Resp 18

## 2015-03-10 DIAGNOSIS — D509 Iron deficiency anemia, unspecified: Secondary | ICD-10-CM | POA: Diagnosis not present

## 2015-03-10 MED ORDER — SODIUM CHLORIDE 0.9 % IV SOLN
200.0000 mg | Freq: Once | INTRAVENOUS | Status: AC
  Administered 2015-03-10: 200 mg via INTRAVENOUS
  Filled 2015-03-10: qty 10

## 2015-03-10 MED ORDER — SODIUM CHLORIDE 0.9 % IV SOLN
Freq: Once | INTRAVENOUS | Status: AC
  Administered 2015-03-10: 09:00:00 via INTRAVENOUS
  Filled 2015-03-10: qty 1000

## 2015-03-16 ENCOUNTER — Other Ambulatory Visit: Payer: Self-pay

## 2015-03-16 DIAGNOSIS — D509 Iron deficiency anemia, unspecified: Secondary | ICD-10-CM

## 2015-03-17 ENCOUNTER — Ambulatory Visit: Payer: Medicare Other

## 2015-03-17 ENCOUNTER — Encounter: Payer: Self-pay | Admitting: Hematology and Oncology

## 2015-03-17 ENCOUNTER — Inpatient Hospital Stay (HOSPITAL_BASED_OUTPATIENT_CLINIC_OR_DEPARTMENT_OTHER): Payer: Medicare Other | Admitting: Hematology and Oncology

## 2015-03-17 ENCOUNTER — Inpatient Hospital Stay: Payer: Medicare Other

## 2015-03-17 VITALS — BP 119/72 | HR 73 | Temp 95.6°F | Resp 18 | Ht 73.0 in | Wt 275.6 lb

## 2015-03-17 DIAGNOSIS — D509 Iron deficiency anemia, unspecified: Secondary | ICD-10-CM

## 2015-03-17 DIAGNOSIS — Z79899 Other long term (current) drug therapy: Secondary | ICD-10-CM

## 2015-03-17 DIAGNOSIS — N19 Unspecified kidney failure: Secondary | ICD-10-CM | POA: Insufficient documentation

## 2015-03-17 LAB — CBC WITH DIFFERENTIAL/PLATELET
Basophils Absolute: 0.1 10*3/uL (ref 0–0.1)
Basophils Relative: 1 %
Eosinophils Absolute: 0.1 10*3/uL (ref 0–0.7)
Eosinophils Relative: 2 %
HCT: 35.7 % (ref 35.0–47.0)
Hemoglobin: 11.1 g/dL — ABNORMAL LOW (ref 12.0–16.0)
Lymphocytes Relative: 28 %
Lymphs Abs: 1.3 10*3/uL (ref 1.0–3.6)
MCH: 25 pg — ABNORMAL LOW (ref 26.0–34.0)
MCHC: 31.1 g/dL — ABNORMAL LOW (ref 32.0–36.0)
MCV: 80.2 fL (ref 80.0–100.0)
Monocytes Absolute: 0.4 10*3/uL (ref 0.2–0.9)
Monocytes Relative: 8 %
Neutro Abs: 2.7 10*3/uL (ref 1.4–6.5)
Neutrophils Relative %: 61 %
Platelets: 232 10*3/uL (ref 150–440)
RBC: 4.45 MIL/uL (ref 3.80–5.20)
RDW: 18.2 % — ABNORMAL HIGH (ref 11.5–14.5)
WBC: 4.5 10*3/uL (ref 3.6–11.0)

## 2015-03-17 LAB — IRON AND TIBC
Iron: 53 ug/dL (ref 28–170)
Saturation Ratios: 15 % (ref 10.4–31.8)
TIBC: 365 ug/dL (ref 250–450)
UIBC: 312 ug/dL

## 2015-03-17 LAB — FERRITIN: Ferritin: 113 ng/mL (ref 11–307)

## 2015-03-17 NOTE — Progress Notes (Signed)
[facility] day:  03/17/2015  Chief Complaint: Nicole Bishop is a 81 y.o. female with stage III chronic kidney disease and iron deficiency who is seen for 3 month assessment.  HPI: The patient was last seen in the medical oncology clinic on 02/23/2015.  At that time, she felt "ok".  She still felt weak and tired.  She was eating more iron rich foods.  CBC revealed a hematocrit 34.9, hemoglobin 10.9, MCV 77.9, platelets 234,000, white count 4500 with an ANC of 2500.  Ferritin was 9 with an iron saturation of 6% and a TIBC of 463 (high).  She was preauthorized for Venofer.  She received Venofer x 2 (03/03/2015 and 03/10/2015).  During the interim, done "pretty good".   She notes being dizzy after taking her blood pressure medications. He saw Dawson Bills on 02/16/2015. An EGD and colonoscopy are planned for 03/30/2015.   Past Medical History  Diagnosis Date  . Hypertension   . Chronic kidney disease   . Anemia   . IDA (iron deficiency anemia) 12/14/2014    Past Surgical History  Procedure Laterality Date  . Replacement total knee bilateral Bilateral   . Gallbladdedr removed    . Fatty tumor removed on left shoulder      Family History  Problem Relation Age of Onset  . Arthritis Mother   . Hypertension Mother   . Hypertension Father     Social History:  reports that she has quit smoking. She has never used smokeless tobacco. She reports that she does not drink alcohol or use illicit drugs.   Allergies:  Allergies  Allergen Reactions  . Penicillins Rash  . Celebrex [Celecoxib] Other (See Comments)    constipation  . Felodipine Nausea Only  . Oxaprozin Nausea Only  . Penicillins Cross Reactors   . Rofecoxib Other (See Comments)    GI upset  . Atorvastatin Rash    Current Medications: Current Outpatient Prescriptions  Medication Sig Dispense Refill  . amLODipine (NORVASC) 10 MG tablet Take 1 tablet (10 mg total) by mouth  daily. Pt needs OV for more refills. 30 tablet 0  . aspirin 81 MG tablet Take 81 mg by mouth every other day.      . Cholecalciferol (VITAMIN D3) 1000 UNITS CAPS Take by mouth.    . docusate sodium (COLACE) 100 MG capsule Take by mouth.    . esomeprazole (NEXIUM) 40 MG capsule Take 40 mg by mouth daily before breakfast.      . ferrous sulfate 325 (65 FE) MG tablet Take 325 mg by mouth 3 (three) times daily with meals.      Marland Kitchen labetalol (NORMODYNE) 100 MG tablet Take 0.5 tablets (50 mg total) by mouth at bedtime. 15 tablet 0  . loratadine (CLARITIN) 10 MG tablet Take 1 tablet (10 mg total) by mouth daily. 30 tablet 0  . losartan (COZAAR) 100 MG tablet Take 1 tablet (100 mg total) by mouth daily. (Patient taking differently: Take 50 mg by mouth daily. ) 30 tablet 11  . mometasone (NASONEX) 50 MCG/ACT nasal spray Place 2 sprays into the nose daily.      Marland Kitchen olopatadine (PATANOL) 0.1 % ophthalmic solution Place 1 drop into both eyes 2 (two) times daily. At 6-8 hours intervals     . polyethylene glycol powder (GLYCOLAX/MIRALAX) powder     . citalopram (CELEXA) 10 MG tablet Take 1 tablet (10 mg total) by mouth daily. 30 tablet 2  No current facility-administered medications for this visit.    Review of Systems:  GENERAL:  Feels "pretty good".  No fevers, sweats or weight loss. PERFORMANCE STATUS (ECOG):  1 HEENT:  No visual changes, runny nose, sore throat, mouth sores or tenderness. Lungs: No shortness of breath or cough.  No hemoptysis. Cardiac:  No chest pain, palpitations, orthopnea, or PND.  Dizzy after taking blood pressure pill. GI:  No nausea, vomiting, diarrhea, constipation, melena or hematochezia. GU:  No urgency, frequency, dysuria, or hematuria. Musculoskeletal:  No back pain.  No joint pain.  No muscle tenderness. Extremities:  No pain or swelling. Skin:  No rashes or skin changes. Neuro:  No headache, numbness or weakness, balance or coordination issues. Endocrine:  No diabetes,  thyroid issues, hot flashes or night sweats. Psych:  No mood changes, depression or anxiety. Pain:  No focal pain. Review of systems:  All other systems reviewed and found to be negative.   Physical Exam: Blood pressure 119/72, pulse 73, temperature 95.6 F (35.3 C), temperature source Tympanic, resp. rate 18, height 6\' 1"  (1.854 m), weight 275 lb 9.2 oz (125 kg). GENERAL:  Well developed, well nourished, woman sitting comfortably in a wheelchair in the exam room in no acute distress. MENTAL STATUS:  Alert and oriented to person, place and time. HEAD:  Shoulder length white hair.  Normocephalic, atraumatic, face symmetric, no Cushingoid features. EYES:  Brown eyes.  Bilateral arcus.  Pupils equal round and reactive to light and accomodation.  No conjunctivitis or scleral icterus. ENT:  Oropharynx clear without lesion.  Tongue normal. Mucous membranes moist.  RESPIRATORY:  Clear to auscultation without rales, wheezes or rhonchi. CARDIOVASCULAR:  Regular rate and rhythm without murmur, rub or gallop. ABDOMEN:  Soft, non-tender, with active bowel sounds, and no hepatosplenomegaly.  No masses. SKIN:  No rashes, ulcers or lesions. EXTREMITIES: No edema, no skin discoloration or tenderness.  No palpable cords. LYMPH NODES: No palpable cervical, supraclavicular, axillary or inguinal adenopathy  NEUROLOGICAL: Unremarkable. PSYCH:  Appropriate.   Appointment on 03/17/2015  Component Date Value Ref Range Status  . WBC 03/17/2015 4.5  3.6 - 11.0 K/uL Final  . RBC 03/17/2015 4.45  3.80 - 5.20 MIL/uL Final  . Hemoglobin 03/17/2015 11.1* 12.0 - 16.0 g/dL Final  . HCT 03/17/2015 35.7  35.0 - 47.0 % Final  . MCV 03/17/2015 80.2  80.0 - 100.0 fL Final  . MCH 03/17/2015 25.0* 26.0 - 34.0 pg Final  . MCHC 03/17/2015 31.1* 32.0 - 36.0 g/dL Final  . RDW 03/17/2015 18.2* 11.5 - 14.5 % Final  . Platelets 03/17/2015 232  150 - 440 K/uL Final  . Neutrophils Relative % 03/17/2015 61   Final  . Neutro Abs  03/17/2015 2.7  1.4 - 6.5 K/uL Final  . Lymphocytes Relative 03/17/2015 28   Final  . Lymphs Abs 03/17/2015 1.3  1.0 - 3.6 K/uL Final  . Monocytes Relative 03/17/2015 8   Final  . Monocytes Absolute 03/17/2015 0.4  0.2 - 0.9 K/uL Final  . Eosinophils Relative 03/17/2015 2   Final  . Eosinophils Absolute 03/17/2015 0.1  0 - 0.7 K/uL Final  . Basophils Relative 03/17/2015 1   Final  . Basophils Absolute 03/17/2015 0.1  0 - 0.1 K/uL Final    Assessment:  Nicole Bishop is a 81 y.o. African American woman with stage III chronic kidney disease and iron deficiency.  Hematocrit and MCV were normal on 09/03/2013.    She denies any GI bleeding.  Her diet is modest.  Her last colonoscopy was < 5 years ago and was notable for numerous polyps.  She has a history of a gastric ulcer when she was young.  Stool guaiacs are pending.  She has been on B12 supplimentation for 3-6 months.  She is intolerant of oral iron secondary to baseline chronic constipation.   Labs on 09/12/2014 revealed a hematocrit of 30 with an MCV of 67. Ferritin was 6 (low) with a TIBC of 487 (high).  Normal studies included direct Coombs, folic acid, 123456, haptoglobin, erythropoietin, and SPEP.  She received weekly Venofer x 4 (10/21/2014 - 11/11/2014).  She received Venofer x 2 (03/03/2015 and 03/10/2015).  Symptomatically, she feel better.  Exam is stable.  Hematocrit has improved from 34.9 to 35.7.  Ferritin  Has improved from 9 to 113.  Plan: 1. Labs today:  CBC with diff, ferritin, iron studies.   2. Follow-up with GI regarding EGD and colonoscopy. 3. RTC in 3 months for MD assess, labs (CBC with diff, ferritin) +/- Venofer.   Lequita Asal, MD  03/17/2015, 11:24 AM

## 2015-04-10 ENCOUNTER — Ambulatory Visit: Payer: Medicare Other | Admitting: Anesthesiology

## 2015-04-10 ENCOUNTER — Ambulatory Visit
Admission: RE | Admit: 2015-04-10 | Discharge: 2015-04-10 | Disposition: A | Discharge status: Home / Self Care | Payer: Medicare Other | Source: Ambulatory Visit | Attending: Unknown Physician Specialty | Admitting: Unknown Physician Specialty

## 2015-04-10 ENCOUNTER — Encounter
Admission: RE | Disposition: A | Discharge status: Home / Self Care | Payer: Self-pay | Source: Ambulatory Visit | Attending: Unknown Physician Specialty

## 2015-04-10 DIAGNOSIS — K5730 Diverticulosis of large intestine without perforation or abscess without bleeding: Secondary | ICD-10-CM | POA: Insufficient documentation

## 2015-04-10 DIAGNOSIS — Z8249 Family history of ischemic heart disease and other diseases of the circulatory system: Secondary | ICD-10-CM | POA: Diagnosis not present

## 2015-04-10 DIAGNOSIS — Z86010 Personal history of colonic polyps: Secondary | ICD-10-CM | POA: Insufficient documentation

## 2015-04-10 DIAGNOSIS — D509 Iron deficiency anemia, unspecified: Secondary | ICD-10-CM | POA: Diagnosis present

## 2015-04-10 DIAGNOSIS — Z96653 Presence of artificial knee joint, bilateral: Secondary | ICD-10-CM | POA: Insufficient documentation

## 2015-04-10 DIAGNOSIS — Z87891 Personal history of nicotine dependence: Secondary | ICD-10-CM | POA: Insufficient documentation

## 2015-04-10 DIAGNOSIS — Z886 Allergy status to analgesic agent status: Secondary | ICD-10-CM | POA: Diagnosis not present

## 2015-04-10 DIAGNOSIS — N189 Chronic kidney disease, unspecified: Secondary | ICD-10-CM | POA: Insufficient documentation

## 2015-04-10 DIAGNOSIS — K2970 Gastritis, unspecified, without bleeding: Secondary | ICD-10-CM | POA: Diagnosis not present

## 2015-04-10 DIAGNOSIS — K449 Diaphragmatic hernia without obstruction or gangrene: Secondary | ICD-10-CM | POA: Diagnosis not present

## 2015-04-10 DIAGNOSIS — Z9049 Acquired absence of other specified parts of digestive tract: Secondary | ICD-10-CM | POA: Insufficient documentation

## 2015-04-10 DIAGNOSIS — Z8261 Family history of arthritis: Secondary | ICD-10-CM | POA: Diagnosis not present

## 2015-04-10 DIAGNOSIS — K640 First degree hemorrhoids: Secondary | ICD-10-CM | POA: Diagnosis not present

## 2015-04-10 DIAGNOSIS — Z79899 Other long term (current) drug therapy: Secondary | ICD-10-CM | POA: Diagnosis not present

## 2015-04-10 DIAGNOSIS — Z880 Allergy status to penicillin: Secondary | ICD-10-CM | POA: Diagnosis not present

## 2015-04-10 DIAGNOSIS — I129 Hypertensive chronic kidney disease with stage 1 through stage 4 chronic kidney disease, or unspecified chronic kidney disease: Secondary | ICD-10-CM | POA: Insufficient documentation

## 2015-04-10 HISTORY — PX: ESOPHAGOGASTRODUODENOSCOPY (EGD) WITH PROPOFOL: SHX5813

## 2015-04-10 HISTORY — PX: COLONOSCOPY WITH PROPOFOL: SHX5780

## 2015-04-10 SURGERY — ESOPHAGOGASTRODUODENOSCOPY (EGD) WITH PROPOFOL
Anesthesia: General

## 2015-04-10 MED ORDER — VANCOMYCIN HCL IN DEXTROSE 1-5 GM/200ML-% IV SOLN
1000.0000 mg | INTRAVENOUS | Status: AC
  Administered 2015-04-10: 1000 mg via INTRAVENOUS
  Filled 2015-04-10: qty 200

## 2015-04-10 MED ORDER — PROPOFOL INFUSION 10 MG/ML OPTIME
INTRAVENOUS | Status: DC | PRN
  Administered 2015-04-10: 100 ug/kg/min via INTRAVENOUS

## 2015-04-10 MED ORDER — SODIUM CHLORIDE 0.9 % IV SOLN
INTRAVENOUS | Status: DC
  Administered 2015-04-10: 1000 mL via INTRAVENOUS
  Administered 2015-04-10: 12:00:00 via INTRAVENOUS

## 2015-04-10 MED ORDER — GENTAMICIN SULFATE 40 MG/ML IJ SOLN
100.0000 mg | INTRAVENOUS | Status: AC
  Administered 2015-04-10: 100 mg via INTRAVENOUS
  Filled 2015-04-10: qty 2.5

## 2015-04-10 MED ORDER — SODIUM CHLORIDE 0.9 % IV SOLN: INTRAVENOUS | Status: DC

## 2015-04-10 MED ORDER — GENTAMICIN IN SALINE 1-0.9 MG/ML-% IV SOLN
100.0000 mg | INTRAVENOUS | Status: DC
  Filled 2015-04-10: qty 100

## 2015-04-10 MED ORDER — PROPOFOL 10 MG/ML IV BOLUS
INTRAVENOUS | Status: DC | PRN
  Administered 2015-04-10: 50 mg via INTRAVENOUS

## 2015-04-10 NOTE — H&P (Signed)
Primary Care Physician:  Glendon Axe, MD Primary Gastroenterologist:  Dr. Vira Agar  Pre-Procedure History & Physical: HPI:  Nicole Bishop is a 81 y.o. female is here for an endoscopy and colonoscopy.   Past Medical History  Diagnosis Date  . Hypertension   . Chronic kidney disease   . Anemia   . IDA (iron deficiency anemia) 12/14/2014    Past Surgical History  Procedure Laterality Date  . Replacement total knee bilateral Bilateral   . Gallbladdedr removed    . Fatty tumor removed on left shoulder      Prior to Admission medications   Medication Sig Start Date End Date Taking? Authorizing Provider  amLODipine (NORVASC) 10 MG tablet Take 1 tablet (10 mg total) by mouth daily. Pt needs OV for more refills. 09/08/12  Yes Jackolyn Confer, MD  Cholecalciferol (VITAMIN D3) 1000 UNITS CAPS Take by mouth.   Yes Historical Provider, MD  docusate sodium (COLACE) 100 MG capsule Take by mouth. 06/03/14  Yes Historical Provider, MD  esomeprazole (NEXIUM) 40 MG capsule Take 40 mg by mouth daily before breakfast.     Yes Historical Provider, MD  labetalol (NORMODYNE) 100 MG tablet Take 0.5 tablets (50 mg total) by mouth at bedtime. 07/22/12  Yes Jackolyn Confer, MD  loratadine (CLARITIN) 10 MG tablet Take 1 tablet (10 mg total) by mouth daily. 02/12/13  Yes Jackolyn Confer, MD  losartan (COZAAR) 100 MG tablet Take 1 tablet (100 mg total) by mouth daily. Patient taking differently: Take 50 mg by mouth daily.  05/21/11  Yes Jackolyn Confer, MD  mometasone (NASONEX) 50 MCG/ACT nasal spray Place 2 sprays into the nose daily.     Yes Historical Provider, MD  olopatadine (PATANOL) 0.1 % ophthalmic solution Place 1 drop into both eyes 2 (two) times daily. At 6-8 hours intervals    Yes Historical Provider, MD  polyethylene glycol powder (GLYCOLAX/MIRALAX) powder  03/07/15  Yes Historical Provider, MD  aspirin 81 MG tablet Take 81 mg by mouth every other day.      Historical Provider, MD   citalopram (CELEXA) 10 MG tablet Take 1 tablet (10 mg total) by mouth daily. 05/21/11 05/20/12  Jackolyn Confer, MD  ferrous sulfate 325 (65 FE) MG tablet Take 325 mg by mouth 3 (three) times daily with meals.      Historical Provider, MD    Allergies as of 02/16/2015 - Review Complete 05/21/2011  Allergen Reaction Noted  . Celebrex [celecoxib]  05/21/2011  . Penicillins cross reactors  05/21/2011    Family History  Problem Relation Age of Onset  . Arthritis Mother   . Hypertension Mother   . Hypertension Father     Social History   Social History  . Marital Status: Widowed    Spouse Name: N/A  . Number of Children: N/A  . Years of Education: N/A   Occupational History  . Not on file.   Social History Main Topics  . Smoking status: Former Research scientist (life sciences)  . Smokeless tobacco: Never Used  . Alcohol Use: No  . Drug Use: No  . Sexual Activity: Not on file   Other Topics Concern  . Not on file   Social History Narrative    Review of Systems: See HPI, otherwise negative ROS  Physical Exam: BP 153/92 mmHg  Pulse 73  Temp(Src) 97.8 F (36.6 C) (Tympanic)  Resp 16  Ht 6\' 1"  (1.854 m)  Wt 127.007 kg (280 lb)  BMI 36.95 kg/m2  SpO2 96% General:   Alert,  pleasant and cooperative in NAD Head:  Normocephalic and atraumatic. Neck:  Supple; no masses or thyromegaly. Lungs:  Clear throughout to auscultation.    Heart:  Regular rate and rhythm. Abdomen:  Soft, nontender and nondistended. Normal bowel sounds, without guarding, and without rebound.   Neurologic:  Alert and  oriented x4;  grossly normal neurologically.  Impression/Plan: Nicole Bishop is here for an endoscopy and colonoscopy to be performed for iron def anemia  Risks, benefits, limitations, and alternatives regarding  endoscopy and colonoscopy have been reviewed with the patient.  Questions have been answered.  All parties agreeable.   Gaylyn Cheers, MD  04/10/2015, 9:55 AM

## 2015-04-10 NOTE — Anesthesia Postprocedure Evaluation (Signed)
  Anesthesia Post-op Note  Patient: Nicole Bishop  Procedure(s) Performed: Procedure(s): ESOPHAGOGASTRODUODENOSCOPY (EGD) WITH PROPOFOL (N/A) COLONOSCOPY WITH PROPOFOL (N/A)  Anesthesia type:General  Patient location: PACU  Post pain: Pain level controlled  Post assessment: Post-op Vital signs reviewed, Patient's Cardiovascular Status Stable, Respiratory Function Stable, Patent Airway and No signs of Nausea or vomiting  Post vital signs: Reviewed and stable  Last Vitals:  Filed Vitals:   04/10/15 1240  BP: 81/47  Pulse: 58  Temp: 36.4 C  Resp: 19    Level of consciousness: awake, alert  and patient cooperative  Complications: No apparent anesthesia complications

## 2015-04-10 NOTE — Anesthesia Preprocedure Evaluation (Addendum)
Anesthesia Evaluation  Patient identified by MRN, date of birth, ID band Patient awake    Reviewed: Allergy & Precautions, NPO status , Patient's Chart, lab work & pertinent test results  History of Anesthesia Complications Negative for: history of anesthetic complications  Airway Mallampati: III  TM Distance: >3 FB Neck ROM: Full    Dental  (+) Upper Dentures, Lower Dentures   Pulmonary asthma (pt denies) , former smoker (pt denies),          Cardiovascular hypertension, Pt. on medications and Pt. on home beta blockers + Past MI (mild, no tx)     Neuro/Psych Depression    GI/Hepatic GERD- (no taking meds)  Medicated and Controlled,  Endo/Other  Hypothyroidism (no tx)   Renal/GU CRF and Renal InsufficiencyRenal disease     Musculoskeletal   Abdominal   Peds  Hematology  (+) anemia ,   Anesthesia Other Findings   Reproductive/Obstetrics                            Anesthesia Physical Anesthesia Plan  ASA: III  Anesthesia Plan: General   Post-op Pain Management:    Induction: Intravenous  Airway Management Planned: Nasal Cannula  Additional Equipment:   Intra-op Plan:   Post-operative Plan:   Informed Consent: I have reviewed the patients History and Physical, chart, labs and discussed the procedure including the risks, benefits and alternatives for the proposed anesthesia with the patient or authorized representative who has indicated his/her understanding and acceptance.     Plan Discussed with:   Anesthesia Plan Comments:         Anesthesia Quick Evaluation

## 2015-04-10 NOTE — Op Note (Signed)
[facility] Gastroenterology Patient Name: Nicole Bishop Procedure Date: 04/10/2015 11:50 AM MRN: [id number] Account #: [id number] Date of Birth: May 12, 1931 Admit Type: Outpatient Age: 81 Room: [facility] ENDO ROOM 1 Gender: Female Note Status: Finalized Procedure:         Colonoscopy Indications:       Iron deficiency anemia, Unexplained iron deficiency anemia Providers:         Manya Silvas, MD Referring MD:      Glendon Axe (Referring MD) Medicines:         Propofol per Anesthesia Complications:     No immediate complications. Procedure:         Pre-Anesthesia Assessment:                    - After reviewing the risks and benefits, the patient was                     deemed in satisfactory condition to undergo the procedure.                    After obtaining informed consent, the colonoscope was                     passed under direct vision. Throughout the procedure, the                     patient's blood pressure, pulse, and oxygen saturations                     were monitored continuously. The Colonoscope was                     introduced through the anus and advanced to the the cecum,                     identified by appendiceal orifice and ileocecal valve. The                     colonoscopy was somewhat difficult due to significant                     looping. Successful completion of the procedure was aided                     by applying abdominal pressure. The patient tolerated the                     procedure well. The quality of the bowel preparation was                     adequate to identify polyps. Findings:      Multiple small and large-mouthed diverticula were found in the sigmoid       colon, in the descending colon, in the transverse colon and in the       ascending colon.      Internal hemorrhoids were found during endoscopy. The hemorrhoids were       small, medium-sized and Grade I (internal hemorrhoids that do not        prolapse).      The exam was otherwise without abnormality. Impression:        - Diverticulosis in the sigmoid colon, in the descending  colon, in the transverse colon and in the ascending colon.                    - Internal hemorrhoids.                    - The examination was otherwise normal.                    - No specimens collected. Recommendation:    - The findings and recommendations were discussed with the                     patient's family. Manya Silvas, MD 04/10/2015 12:33:52 PM This report has been signed electronically. Number of Addenda: 0 Note Initiated On: 04/10/2015 11:50 AM Scope Withdrawal Time: 0 hours 5 minutes 43 seconds  Total Procedure Duration: 0 hours 11 minutes 58 seconds       [facility]

## 2015-04-10 NOTE — Transfer of Care (Signed)
Immediate Anesthesia Transfer of Care Note  Patient: Nicole Bishop  Procedure(s) Performed: Procedure(s): ESOPHAGOGASTRODUODENOSCOPY (EGD) WITH PROPOFOL (N/A) COLONOSCOPY WITH PROPOFOL (N/A)  Patient Location: PACU  Anesthesia Type:General  Level of Consciousness: awake  Airway & Oxygen Therapy: Patient Spontanous Breathing  Post-op Assessment: Report given to RN  Post vital signs: stable  Last Vitals:  Filed Vitals:   04/10/15 0933  BP: 153/92  Pulse: 73  Temp: 36.6 C  Resp: 16    Complications: No apparent anesthesia complications

## 2015-04-10 NOTE — Op Note (Signed)
[facility] Gastroenterology Patient Name: Nicole Bishop Procedure Date: 04/10/2015 11:51 AM MRN: [id number] Account #: [id number] Date of Birth: 1931/04/08 Admit Type: Outpatient Age: 81 Room: [facility] ENDO ROOM 1 Gender: Female Note Status: Finalized Procedure:         Upper GI endoscopy Indications:       Iron deficiency anemia Providers:         Manya Silvas, MD Referring MD:      Glendon Axe (Referring MD) Medicines:         Propofol per Anesthesia Complications:     No immediate complications. Procedure:         Pre-Anesthesia Assessment:                    - After reviewing the risks and benefits, the patient was                     deemed in satisfactory condition to undergo the procedure.                    After obtaining informed consent, the endoscope was passed                     under direct vision. Throughout the procedure, the                     patient's blood pressure, pulse, and oxygen saturations                     were monitored continuously. The Olympus GIF-160 endoscope                     (S#. [id number]) was introduced through the mouth, and                     advanced to the second part of duodenum. The upper GI                     endoscopy was accomplished without difficulty. The patient                     tolerated the procedure well. Findings:      The examined esophagus was normal.      A large hiatus hernia was present.      Patchy moderate inflammation characterized by erosions, erythema and       granularity was found in the gastric body. Biopsies were taken with a       cold forceps for Helicobacter pylori testing. Biopsies were taken with a       cold forceps for histology.      The gastric antrum was normal.      The examined duodenum was normal. Impression:        - Normal esophagus.                    - Large hiatus hernia.                    - Gastritis. Biopsied.                    - Normal antrum.     - Normal examined duodenum. Recommendation:    - Await pathology results. Do colonoscopy Manya Silvas, MD 04/10/2015 12:17:12 PM This report has been signed electronically. Number of  Addenda: 0 Note Initiated On: 04/10/2015 11:51 AM Total Procedure Duration: 0 hours 9 minutes 57 seconds       [facility]

## 2015-04-11 LAB — SURGICAL PATHOLOGY

## 2015-04-12 ENCOUNTER — Encounter: Payer: Self-pay | Admitting: Unknown Physician Specialty

## 2015-05-17 ENCOUNTER — Ambulatory Visit: Payer: Medicare Other | Admitting: Dietician

## 2015-05-22 ENCOUNTER — Ambulatory Visit: Payer: Medicare Other | Admitting: Dietician

## 2015-05-25 ENCOUNTER — Encounter: Payer: Medicare Other | Attending: Cardiology | Admitting: Dietician

## 2015-05-25 VITALS — Ht 73.0 in | Wt 273.5 lb

## 2015-05-25 DIAGNOSIS — N183 Chronic kidney disease, stage 3 unspecified: Secondary | ICD-10-CM

## 2015-05-25 DIAGNOSIS — E669 Obesity, unspecified: Secondary | ICD-10-CM | POA: Diagnosis present

## 2015-05-25 DIAGNOSIS — I129 Hypertensive chronic kidney disease with stage 1 through stage 4 chronic kidney disease, or unspecified chronic kidney disease: Secondary | ICD-10-CM | POA: Insufficient documentation

## 2015-05-25 DIAGNOSIS — E785 Hyperlipidemia, unspecified: Secondary | ICD-10-CM | POA: Insufficient documentation

## 2015-05-25 DIAGNOSIS — D509 Iron deficiency anemia, unspecified: Secondary | ICD-10-CM

## 2015-05-25 NOTE — Patient Instructions (Signed)
   Use 90% or 93% lean ground beef, cook with low sodium broth or gravy to keep moist and easy to chew.   Eat something every 3-4 hours during the day, including a protein + healthy carbohydrate + vegetables and fruit.   Include some high-iron foods every day, dark green vegetables, dried fruit like raisins with cereal like Cheerios, lean meats, eggs, or try some Blackstrap molasses.

## 2015-05-25 NOTE — Progress Notes (Signed)
Medical Nutrition Therapy: Visit start time: 0900  end time: 1000  Assessment:  Diagnosis: chronic renal disease, hypertension, hyperlipidemia, obesity Past medical history: diverticulosis, GERD, sleep apnea Psychosocial issues/ stress concerns: Patient reports moderate stress level.  Preferred learning method:  . Auditory . Visual . Hands-on  Current weight: 273.5lbs  Height: 6'1" Medications, supplements: reviewed list in chart with patient and daughter Progress and evaluation: Patient and daughter voice concern over iron deficiency anemia, and request help with finding iron-rich foods.          They report that patient is following a low-sodium diet.          Patient reports poor appetite at times, especially in the mornings.   Physical activity: none  Dietary Intake:  Usual eating pattern includes 2 meals and 2-3 snacks per day. Dining out frequency: 0-1 meals per week.  Breakfast: 10/6 banana, often none -- unable to eat. When hungry, will have egg, cheese, sausage, toast, juice, coffee. Sometimes cereal, oatmeal, or grits.  Snack: fruit Lunch: sandwich. If eats larger breakfast, then no lunch and early dinner Snack: fruit Supper:3-4pm  steamed vegetables with noodles, few shrimp. Cooks Sunday dinner, then eats leftovers for a few days. Chuck roast, greens, lima beans and corn, cornbread. Snack: nabs and fruit Beverages: water    Nutrition Care Education: Topics covered: weight management, iron deficiency, renal disease, heart health Basic nutrition: basic food groups, appropriate nutrient balance, appropriate meal and snack schedule, general nutrition guidelines    Weight control: benefits of weight control, importance of low fat and low sugar foods, portion control Hypertension, renal disease:  importance of controlling BP, identifying high sodium foods, controlling portions of meats  Hyperlipidemia:  healthy and unhealthy fats, role of fiber, importance of vegetables and  fruits.  Iron deficiency: iron-rich foods, factors that increase or decrease absorption.   Nutritional Diagnosis:  Ravenel-3.3 Overweight/obesity As related to inactivity, history of excess caloric intake.  As evidenced by patient and daughter reports. NI-5.10.1 Inadequate mineral intake (specify): iron As related to low blood levels of iron.  As evidenced by MD notes, patient report.  Intervention: Instruction as noted above.    Discussed easy meal options, including nutrition drinks/ shakes if not hungry, and advised patient to eat at regular intervals.    Encouraged patient to eat an iron-rich food 3-4 times a day.   Advised including protein source with meals, but controlled portions.     Education Materials given:  . Plate Planner . Food lists/ Planning A Heart Healthy Meal . Sample meal pattern/ menus: Quick and Healthy Meal Ideas . Snacking handout . Goals/ instructions . Iron Deficiency Anemia (RD 411)  Learner/ who was taught:  . Patient  . Family member daughter  Level of understanding: Marland Kitchen Verbalizes/ demonstrates competency (with daughter's assistance)  Demonstrated degree of understanding via:   Teach back Learning barriers:  Willingness to learn/ readiness for change: . Eager, change in progress  Monitoring and Evaluation:  Dietary intake, exercise, iron level, and body weight      follow up: prn

## 2015-09-30 ENCOUNTER — Encounter: Payer: Self-pay | Admitting: Hematology and Oncology

## 2015-10-02 ENCOUNTER — Other Ambulatory Visit: Payer: Self-pay

## 2015-10-02 DIAGNOSIS — D509 Iron deficiency anemia, unspecified: Secondary | ICD-10-CM

## 2015-10-11 ENCOUNTER — Other Ambulatory Visit: Payer: Self-pay

## 2015-10-11 DIAGNOSIS — D509 Iron deficiency anemia, unspecified: Secondary | ICD-10-CM

## 2015-10-13 ENCOUNTER — Inpatient Hospital Stay: Payer: Medicare Other | Attending: Hematology and Oncology | Admitting: Hematology and Oncology

## 2015-10-13 ENCOUNTER — Encounter: Payer: Self-pay | Admitting: Hematology and Oncology

## 2015-10-13 ENCOUNTER — Inpatient Hospital Stay: Payer: Medicare Other

## 2015-10-13 ENCOUNTER — Other Ambulatory Visit: Payer: Self-pay

## 2015-10-13 VITALS — BP 133/80 | HR 77 | Temp 98.3°F | Resp 18 | Ht 73.0 in

## 2015-10-13 DIAGNOSIS — I10 Essential (primary) hypertension: Secondary | ICD-10-CM | POA: Insufficient documentation

## 2015-10-13 DIAGNOSIS — I129 Hypertensive chronic kidney disease with stage 1 through stage 4 chronic kidney disease, or unspecified chronic kidney disease: Secondary | ICD-10-CM | POA: Insufficient documentation

## 2015-10-13 DIAGNOSIS — K2970 Gastritis, unspecified, without bleeding: Secondary | ICD-10-CM | POA: Insufficient documentation

## 2015-10-13 DIAGNOSIS — N183 Chronic kidney disease, stage 3 (moderate): Secondary | ICD-10-CM | POA: Insufficient documentation

## 2015-10-13 DIAGNOSIS — K5909 Other constipation: Secondary | ICD-10-CM

## 2015-10-13 DIAGNOSIS — D509 Iron deficiency anemia, unspecified: Secondary | ICD-10-CM

## 2015-10-13 DIAGNOSIS — Z79899 Other long term (current) drug therapy: Secondary | ICD-10-CM | POA: Diagnosis not present

## 2015-10-13 DIAGNOSIS — K648 Other hemorrhoids: Secondary | ICD-10-CM | POA: Diagnosis not present

## 2015-10-13 DIAGNOSIS — Z7982 Long term (current) use of aspirin: Secondary | ICD-10-CM | POA: Diagnosis not present

## 2015-10-13 DIAGNOSIS — K449 Diaphragmatic hernia without obstruction or gangrene: Secondary | ICD-10-CM | POA: Insufficient documentation

## 2015-10-13 DIAGNOSIS — Z87891 Personal history of nicotine dependence: Secondary | ICD-10-CM | POA: Diagnosis not present

## 2015-10-13 DIAGNOSIS — K5730 Diverticulosis of large intestine without perforation or abscess without bleeding: Secondary | ICD-10-CM | POA: Insufficient documentation

## 2015-10-13 DIAGNOSIS — E538 Deficiency of other specified B group vitamins: Secondary | ICD-10-CM

## 2015-10-13 LAB — CBC WITH DIFFERENTIAL/PLATELET
Basophils Absolute: 0 10*3/uL (ref 0–0.1)
Basophils Relative: 1 %
Eosinophils Absolute: 0.1 10*3/uL (ref 0–0.7)
Eosinophils Relative: 1 %
HCT: 34.3 % — ABNORMAL LOW (ref 35.0–47.0)
Hemoglobin: 11.4 g/dL — ABNORMAL LOW (ref 12.0–16.0)
Lymphocytes Relative: 32 %
Lymphs Abs: 1.4 10*3/uL (ref 1.0–3.6)
MCH: 27.5 pg (ref 26.0–34.0)
MCHC: 33.2 g/dL (ref 32.0–36.0)
MCV: 83 fL (ref 80.0–100.0)
Monocytes Absolute: 0.4 10*3/uL (ref 0.2–0.9)
Monocytes Relative: 8 %
Neutro Abs: 2.6 10*3/uL (ref 1.4–6.5)
Neutrophils Relative %: 58 %
Platelets: 223 10*3/uL (ref 150–440)
RBC: 4.13 MIL/uL (ref 3.80–5.20)
RDW: 14.4 % (ref 11.5–14.5)
WBC: 4.5 10*3/uL (ref 3.6–11.0)

## 2015-10-13 LAB — IRON AND TIBC
Iron: 28 ug/dL (ref 28–170)
Saturation Ratios: 7 % — ABNORMAL LOW (ref 10.4–31.8)
TIBC: 398 ug/dL (ref 250–450)
UIBC: 370 ug/dL

## 2015-10-13 LAB — FERRITIN: Ferritin: 12 ng/mL (ref 11–307)

## 2015-10-13 NOTE — Progress Notes (Signed)
[facility] day:  10/13/2015  Chief Complaint: Nicole Bishop is a 82 y.o. female with stage III chronic kidney disease and iron deficiency who is seen for 6 month assessment.  HPI: The patient was last seen in the medical oncology clinic on 03/17/2015.  At that time, she was seen for 3 month assessment.  She had received Venofer x 2 (03/03/2015 and 03/10/2015).  Symptomatically, she felt better.  Exam was stable.  Hematocrit had improved from 34.9 to 35.7.  Ferritin had  improved from 9 to 113.  At last visit, she noted being dizzy after taking her blood pressure medications. He saw Dawson Bills on 02/16/2015. An EGD and colonoscopy were planned for 03/30/2015.    She underwent EGD on 04/10/2015 by Dr. Gaylyn Cheers.  She was noted to have a large hiatal hernia and gastritis.  Biopsy was unremarkable.  Colonoscopy on 04/10/2015 revealed diverticulosis in the sigmoid, descending, transverse, and ascending colon.  She had internal hemorrhoids.  Symptomatically, she feels tired.  She notes some dizzy spells in her kitchen which she associates with taking her blood pressure medications.  Diet is modest.  She drinks juice and coffee for breakfast.  She eats eggs, cheese, and toast mid-day.  She will have steamers and rice for dinner.  She eats a little hamburger, pork chop, and steak.  She craves sweet things.  She has not been on oral iron.  She has not had B12 since 04/2015.  She is scheduled for B12 injection at the [facility] on 10/16/2015.  She denies any melena or hematochezia.   Past Medical History  Diagnosis Date  . Hypertension   . Chronic kidney disease   . Anemia   . IDA (iron deficiency anemia) 12/14/2014    Past Surgical History  Procedure Laterality Date  . Replacement total knee bilateral Bilateral   . Gallbladdedr removed    . Fatty tumor removed on left shoulder    . Esophagogastroduodenoscopy (egd) with propofol N/A  04/10/2015    Procedure: ESOPHAGOGASTRODUODENOSCOPY (EGD) WITH PROPOFOL;  Surgeon: Manya Silvas, MD;  Location: [facility];  Service: Endoscopy;  Laterality: N/A;  . Colonoscopy with propofol N/A 04/10/2015    Procedure: COLONOSCOPY WITH PROPOFOL;  Surgeon: Manya Silvas, MD;  Location: [facility] ENDOSCOPY;  Service: Endoscopy;  Laterality: N/A;    Family History  Problem Relation Age of Onset  . Arthritis Mother   . Hypertension Mother   . Hypertension Father     Social History:  reports that she has quit smoking. She has never used smokeless tobacco. She reports that she does not drink alcohol or use illicit drugs. She is accompanied by her daughter, [name]: phone [phone] and cell [phone].  Allergies:  Allergies  Allergen Reactions  . Penicillins Rash  . Celebrex [Celecoxib] Other (See Comments)    constipation  . Felodipine Nausea Only  . Oxaprozin Nausea Only  . Penicillins Cross Reactors   . Rofecoxib Other (See Comments)    GI upset  . Atorvastatin Rash    Current Medications: Current Outpatient Prescriptions  Medication Sig Dispense Refill  . amLODipine (NORVASC) 5 MG tablet     . aspirin 81 MG tablet Take 81 mg by mouth every other day.      . docusate sodium (COLACE) 100 MG capsule Take by mouth.    . esomeprazole (NEXIUM) 40 MG capsule Take 40 mg by mouth daily before breakfast.      .  GAVILYTE-C 240 G solution     . labetalol (NORMODYNE) 100 MG tablet Take 0.5 tablets (50 mg total) by mouth at bedtime. 15 tablet 0  . loratadine (CLARITIN) 10 MG tablet Take 1 tablet (10 mg total) by mouth daily. 30 tablet 0  . losartan (COZAAR) 50 MG tablet     . mometasone (NASONEX) 50 MCG/ACT nasal spray Place 2 sprays into the nose daily.      Marland Kitchen olopatadine (PATANOL) 0.1 % ophthalmic solution Place 1 drop into both eyes 2 (two) times daily. At 6-8 hours intervals     . traMADol-acetaminophen (ULTRACET) 37.5-325 MG tablet Take by mouth.    . Cholecalciferol  (VITAMIN D3) 1000 UNITS CAPS Take by mouth. Reported on 10/13/2015    . citalopram (CELEXA) 10 MG tablet Take 1 tablet (10 mg total) by mouth daily. 30 tablet 2  . ferrous sulfate 325 (65 FE) MG tablet Take 325 mg by mouth 3 (three) times daily with meals. Reported on 10/13/2015     No current facility-administered medications for this visit.    Review of Systems:  GENERAL:  Feels tired.  No fevers, sweats or weight loss. PERFORMANCE STATUS (ECOG):  1 HEENT:  No visual changes, runny nose, sore throat, mouth sores or tenderness. Lungs: No shortness of breath or cough.  No hemoptysis. Cardiac:  No chest pain, palpitations, orthopnea, or PND.  Dizzy after taking blood pressure pill. GI:  No nausea, vomiting, diarrhea, constipation, melena or hematochezia. GU:  No urgency, frequency, dysuria, or hematuria. Musculoskeletal:  No back pain.  No joint pain.  No muscle tenderness. Extremities:  No pain or swelling. Skin:  No rashes or skin changes. Neuro:  No headache, numbness or weakness, balance or coordination issues. Endocrine:  No diabetes, thyroid issues, hot flashes or night sweats. Psych:  No mood changes, depression or anxiety. Pain:  No focal pain. Review of systems:  All other systems reviewed and found to be negative.   Physical Exam: Blood pressure 133/80, pulse 77, temperature 98.3 F (36.8 C), temperature source Tympanic, resp. rate 18, height 6\' 1"  (1.854 m). GENERAL:  Well developed, well nourished, woman sitting comfortably in a wheelchair in the exam room in no acute distress. MENTAL STATUS:  Alert and oriented to person, place and time. HEAD:  Shoulder length gray hair.  Normocephalic, atraumatic, face symmetric, no Cushingoid features. EYES:  Brown eyes.  Bilateral arcus.  Pupils equal round and reactive to light and accomodation.  No conjunctivitis or scleral icterus. ENT:  Oropharynx clear without lesion.  Tongue normal. Mucous membranes moist.  RESPIRATORY:  Clear to  auscultation without rales, wheezes or rhonchi. CARDIOVASCULAR:  Regular rate and rhythm without murmur, rub or gallop. ABDOMEN:  Soft, non-tender, with active bowel sounds, and no hepatosplenomegaly.  No masses. SKIN:  No rashes, ulcers or lesions. EXTREMITIES: No edema, no skin discoloration or tenderness.  No palpable cords. LYMPH NODES: No palpable cervical, supraclavicular, axillary or inguinal adenopathy  NEUROLOGICAL: Unremarkable. PSYCH:  Appropriate.   Appointment on 10/13/2015  Component Date Value Ref Range Status  . WBC 10/13/2015 4.5  3.6 - 11.0 K/uL Final  . RBC 10/13/2015 4.13  3.80 - 5.20 MIL/uL Final  . Hemoglobin 10/13/2015 11.4* 12.0 - 16.0 g/dL Final  . HCT 10/13/2015 34.3* 35.0 - 47.0 % Final  . MCV 10/13/2015 83.0  80.0 - 100.0 fL Final  . MCH 10/13/2015 27.5  26.0 - 34.0 pg Final  . MCHC 10/13/2015 33.2  32.0 - 36.0 g/dL Final  .  RDW 10/13/2015 14.4  11.5 - 14.5 % Final  . Platelets 10/13/2015 223  150 - 440 K/uL Final  . Neutrophils Relative % 10/13/2015 58   Final  . Neutro Abs 10/13/2015 2.6  1.4 - 6.5 K/uL Final  . Lymphocytes Relative 10/13/2015 32   Final  . Lymphs Abs 10/13/2015 1.4  1.0 - 3.6 K/uL Final  . Monocytes Relative 10/13/2015 8   Final  . Monocytes Absolute 10/13/2015 0.4  0.2 - 0.9 K/uL Final  . Eosinophils Relative 10/13/2015 1   Final  . Eosinophils Absolute 10/13/2015 0.1  0 - 0.7 K/uL Final  . Basophils Relative 10/13/2015 1   Final  . Basophils Absolute 10/13/2015 0.0  0 - 0.1 K/uL Final    Assessment:  SADY MILA is a 82 y.o. African American woman with stage III chronic kidney disease and iron deficiency.  Hematocrit and MCV were normal on 09/03/2013.    She denies any GI bleeding.  Her diet is modest.  EGD on 04/10/2015 revealed a large hiatal hernia and gastritis.  Biopsy was unremarkable.  Colonoscopy on 04/10/2015 revealed diverticulosis in the sigmoid, descending, transverse, and ascending colon.  She had internal  hemorrhoids.  She has been off B12 since 04/2015.  She is intolerant of oral iron secondary to baseline chronic constipation.   Labs on 09/12/2014 revealed a hematocrit of 30 with an MCV of 67. Ferritin was 6 (low) with a TIBC of 487 (high).  Normal studies included direct Coombs, folic acid, 123456, haptoglobin, erythropoietin, and SPEP.  She received weekly Venofer x 4 (10/21/2014 - 11/11/2014) and x2 (03/03/2015 and 03/10/2015).  Symptomatically, she is fatigued.  Exam is stable.  Hematocrit has drifted down to 34.3.  Ferritin is pending.  Plan: 1. Labs today:  CBC with diff, ferritin, iron studies.   2. Review interval EGD and colonoscopy- done. 3. Patient to restart monthly B12 next week at the [facility]. 4. Call patient's daughter if IV iron needed (ferritin < 50).  Goal ferritin is 100. 5. RTC in 3 months labs (CBC with diff, ferritin). 6. RTC in 6 months for MD assessment, labs (CBC with diff, ferritin) +/- IV iron.   Lequita Asal, MD  10/13/2015, 3:43 PM

## 2015-10-20 ENCOUNTER — Inpatient Hospital Stay: Payer: Medicare Other | Attending: Hematology and Oncology

## 2015-10-20 VITALS — BP 123/78 | HR 70 | Temp 98.5°F | Resp 18

## 2015-10-20 DIAGNOSIS — N183 Chronic kidney disease, stage 3 (moderate): Secondary | ICD-10-CM | POA: Diagnosis not present

## 2015-10-20 DIAGNOSIS — Z79899 Other long term (current) drug therapy: Secondary | ICD-10-CM | POA: Diagnosis not present

## 2015-10-20 DIAGNOSIS — I129 Hypertensive chronic kidney disease with stage 1 through stage 4 chronic kidney disease, or unspecified chronic kidney disease: Secondary | ICD-10-CM | POA: Diagnosis not present

## 2015-10-20 DIAGNOSIS — D509 Iron deficiency anemia, unspecified: Secondary | ICD-10-CM | POA: Diagnosis not present

## 2015-10-20 DIAGNOSIS — K5909 Other constipation: Secondary | ICD-10-CM | POA: Diagnosis not present

## 2015-10-20 DIAGNOSIS — K648 Other hemorrhoids: Secondary | ICD-10-CM | POA: Diagnosis not present

## 2015-10-20 MED ORDER — SODIUM CHLORIDE 0.9 % IV SOLN
Freq: Once | INTRAVENOUS | Status: AC
  Administered 2015-10-20: 13:00:00 via INTRAVENOUS
  Filled 2015-10-20: qty 1000

## 2015-10-20 MED ORDER — SODIUM CHLORIDE 0.9 % IV SOLN
200.0000 mg | Freq: Once | INTRAVENOUS | Status: AC
  Administered 2015-10-20: 200 mg via INTRAVENOUS
  Filled 2015-10-20: qty 10

## 2015-10-27 ENCOUNTER — Other Ambulatory Visit: Payer: Self-pay | Admitting: Hematology and Oncology

## 2015-10-27 ENCOUNTER — Inpatient Hospital Stay: Payer: Medicare Other

## 2015-10-27 VITALS — BP 107/61 | HR 67 | Temp 98.7°F | Resp 18

## 2015-10-27 DIAGNOSIS — D509 Iron deficiency anemia, unspecified: Secondary | ICD-10-CM

## 2015-10-27 DIAGNOSIS — I129 Hypertensive chronic kidney disease with stage 1 through stage 4 chronic kidney disease, or unspecified chronic kidney disease: Secondary | ICD-10-CM | POA: Diagnosis not present

## 2015-10-27 MED ORDER — SODIUM CHLORIDE 0.9 % IV SOLN
200.0000 mg | Freq: Once | INTRAVENOUS | Status: AC
  Administered 2015-10-27: 200 mg via INTRAVENOUS
  Filled 2015-10-27: qty 10

## 2015-10-27 MED ORDER — SODIUM CHLORIDE 0.9 % IV SOLN
Freq: Once | INTRAVENOUS | Status: AC
  Administered 2015-10-27: 14:00:00 via INTRAVENOUS
  Filled 2015-10-27: qty 1000

## 2016-01-10 ENCOUNTER — Inpatient Hospital Stay: Payer: Medicare Other | Attending: Hematology and Oncology

## 2016-01-10 DIAGNOSIS — D509 Iron deficiency anemia, unspecified: Secondary | ICD-10-CM | POA: Diagnosis not present

## 2016-01-10 DIAGNOSIS — N183 Chronic kidney disease, stage 3 (moderate): Secondary | ICD-10-CM | POA: Insufficient documentation

## 2016-01-10 DIAGNOSIS — K5909 Other constipation: Secondary | ICD-10-CM | POA: Insufficient documentation

## 2016-01-10 DIAGNOSIS — Z79899 Other long term (current) drug therapy: Secondary | ICD-10-CM | POA: Diagnosis not present

## 2016-01-10 DIAGNOSIS — I129 Hypertensive chronic kidney disease with stage 1 through stage 4 chronic kidney disease, or unspecified chronic kidney disease: Secondary | ICD-10-CM | POA: Insufficient documentation

## 2016-01-10 DIAGNOSIS — K648 Other hemorrhoids: Secondary | ICD-10-CM | POA: Diagnosis not present

## 2016-01-10 LAB — CBC WITH DIFFERENTIAL/PLATELET
Basophils Absolute: 0 10*3/uL (ref 0–0.1)
Basophils Relative: 1 %
Eosinophils Absolute: 0.1 10*3/uL (ref 0–0.7)
Eosinophils Relative: 2 %
HCT: 37.4 % (ref 35.0–47.0)
Hemoglobin: 12.4 g/dL (ref 12.0–16.0)
Lymphocytes Relative: 39 %
Lymphs Abs: 1.9 10*3/uL (ref 1.0–3.6)
MCH: 27.5 pg (ref 26.0–34.0)
MCHC: 33.2 g/dL (ref 32.0–36.0)
MCV: 82.7 fL (ref 80.0–100.0)
Monocytes Absolute: 0.5 10*3/uL (ref 0.2–0.9)
Monocytes Relative: 10 %
Neutro Abs: 2.4 10*3/uL (ref 1.4–6.5)
Neutrophils Relative %: 48 %
Platelets: 196 10*3/uL (ref 150–440)
RBC: 4.52 MIL/uL (ref 3.80–5.20)
RDW: 15.9 % — ABNORMAL HIGH (ref 11.5–14.5)
WBC: 4.9 10*3/uL (ref 3.6–11.0)

## 2016-01-10 LAB — FERRITIN: Ferritin: 31 ng/mL (ref 11–307)

## 2016-04-11 ENCOUNTER — Inpatient Hospital Stay (HOSPITAL_BASED_OUTPATIENT_CLINIC_OR_DEPARTMENT_OTHER): Payer: Medicare Other | Admitting: Hematology and Oncology

## 2016-04-11 ENCOUNTER — Encounter: Payer: Self-pay | Admitting: Hematology and Oncology

## 2016-04-11 ENCOUNTER — Other Ambulatory Visit: Payer: Self-pay | Admitting: *Deleted

## 2016-04-11 ENCOUNTER — Encounter (INDEPENDENT_AMBULATORY_CARE_PROVIDER_SITE_OTHER): Payer: Self-pay

## 2016-04-11 ENCOUNTER — Inpatient Hospital Stay: Payer: Medicare Other | Attending: Hematology and Oncology

## 2016-04-11 VITALS — BP 152/88 | HR 75 | Temp 96.5°F | Resp 18 | Wt 273.0 lb

## 2016-04-11 DIAGNOSIS — E538 Deficiency of other specified B group vitamins: Secondary | ICD-10-CM | POA: Insufficient documentation

## 2016-04-11 DIAGNOSIS — D509 Iron deficiency anemia, unspecified: Secondary | ICD-10-CM | POA: Diagnosis not present

## 2016-04-11 DIAGNOSIS — K449 Diaphragmatic hernia without obstruction or gangrene: Secondary | ICD-10-CM | POA: Diagnosis not present

## 2016-04-11 DIAGNOSIS — K5909 Other constipation: Secondary | ICD-10-CM | POA: Insufficient documentation

## 2016-04-11 DIAGNOSIS — K5730 Diverticulosis of large intestine without perforation or abscess without bleeding: Secondary | ICD-10-CM | POA: Diagnosis not present

## 2016-04-11 DIAGNOSIS — N183 Chronic kidney disease, stage 3 (moderate): Secondary | ICD-10-CM | POA: Insufficient documentation

## 2016-04-11 DIAGNOSIS — I129 Hypertensive chronic kidney disease with stage 1 through stage 4 chronic kidney disease, or unspecified chronic kidney disease: Secondary | ICD-10-CM | POA: Insufficient documentation

## 2016-04-11 DIAGNOSIS — K648 Other hemorrhoids: Secondary | ICD-10-CM | POA: Insufficient documentation

## 2016-04-11 DIAGNOSIS — Z7982 Long term (current) use of aspirin: Secondary | ICD-10-CM | POA: Insufficient documentation

## 2016-04-11 DIAGNOSIS — Z79899 Other long term (current) drug therapy: Secondary | ICD-10-CM | POA: Diagnosis not present

## 2016-04-11 DIAGNOSIS — Z87891 Personal history of nicotine dependence: Secondary | ICD-10-CM | POA: Insufficient documentation

## 2016-04-11 LAB — CBC WITH DIFFERENTIAL/PLATELET
Basophils Absolute: 0 10*3/uL (ref 0–0.1)
Basophils Relative: 1 %
Eosinophils Absolute: 0.1 10*3/uL (ref 0–0.7)
Eosinophils Relative: 1 %
HCT: 36 % (ref 35.0–47.0)
Hemoglobin: 12.1 g/dL (ref 12.0–16.0)
Lymphocytes Relative: 31 %
Lymphs Abs: 1.5 10*3/uL (ref 1.0–3.6)
MCH: 27.8 pg (ref 26.0–34.0)
MCHC: 33.5 g/dL (ref 32.0–36.0)
MCV: 83 fL (ref 80.0–100.0)
Monocytes Absolute: 0.5 10*3/uL (ref 0.2–0.9)
Monocytes Relative: 10 %
Neutro Abs: 2.7 10*3/uL (ref 1.4–6.5)
Neutrophils Relative %: 57 %
Platelets: 198 10*3/uL (ref 150–440)
RBC: 4.34 MIL/uL (ref 3.80–5.20)
RDW: 14.8 % — ABNORMAL HIGH (ref 11.5–14.5)
WBC: 4.8 10*3/uL (ref 3.6–11.0)

## 2016-04-11 LAB — FERRITIN: Ferritin: 19 ng/mL (ref 11–307)

## 2016-04-11 NOTE — Progress Notes (Signed)
[facility] day:  04/11/16  Chief Complaint: Nicole Bishop is a 82 y.o. female with stage III chronic kidney disease and iron deficiency who is seen for 6 month assessment.  HPI: The patient was last seen in the medical oncology clinic on 10/13/2015.  At that time, she was fatigued.  Exam was stable.  Hematocrit had drifted down to 34.3.  Ferritin was 12 with an iron saturation of 7%.  She received Venofer 200 mg IV on 10/20/2015 and 10/27/2015.  Labs on 01/10/2016 revealed a hematocrit of 37.4, hemoglobin 12.4 and MCV 82.7. Ferritin was 31.   Symptomatically, she is doing well. She states that she "used to give out". He has been busy watching her stories and cooking. She is up and down. Her daughter helps her with the house. Her biggest issue is getting around with a heavy walker.  She is receiving her B12 shots at the [place] [facility] monthly (around the 12th of each month). She is also taking oral B12.   Past Medical History:  Diagnosis Date  . Anemia   . Chronic kidney disease   . Hypertension   . IDA (iron deficiency anemia) 12/14/2014    Past Surgical History:  Procedure Laterality Date  . COLONOSCOPY WITH PROPOFOL N/A 04/10/2015   Procedure: COLONOSCOPY WITH PROPOFOL;  Surgeon: Manya Silvas, MD;  Location: [facility] ENDOSCOPY;  Service: Endoscopy;  Laterality: N/A;  . ESOPHAGOGASTRODUODENOSCOPY (EGD) WITH PROPOFOL N/A 04/10/2015   Procedure: ESOPHAGOGASTRODUODENOSCOPY (EGD) WITH PROPOFOL;  Surgeon: Manya Silvas, MD;  Location: [facility] ENDOSCOPY;  Service: Endoscopy;  Laterality: N/A;  . fatty tumor removed on left shoulder    . gallbladdedr removed    . REPLACEMENT TOTAL KNEE BILATERAL Bilateral     Family History  Problem Relation Age of Onset  . Arthritis Mother   . Hypertension Mother   . Hypertension Father     Social History:  reports that she has quit smoking. She has never used smokeless tobacco. She reports that she does  not drink alcohol or use drugs. She is accompanied by her daughter, Nicole Bishop: phone [phone] and cell [phone].  Allergies:  Allergies  Allergen Reactions  . Penicillins Rash  . Celebrex [Celecoxib] Other (See Comments)    constipation  . Felodipine Nausea Only  . Oxaprozin Nausea Only  . Penicillins Cross Reactors   . Rofecoxib Other (See Comments)    GI upset  . Atorvastatin Rash    Current Medications: Current Outpatient Prescriptions  Medication Sig Dispense Refill  . amLODipine (NORVASC) 5 MG tablet Take 5 mg by mouth daily.     Marland Kitchen aspirin 81 MG tablet Take 81 mg by mouth every other day.      . Cholecalciferol (VITAMIN D3) 1000 UNITS CAPS Take by mouth. Reported on 10/13/2015    . citalopram (CELEXA) 10 MG tablet Take 1 tablet (10 mg total) by mouth daily. 30 tablet 2  . docusate sodium (COLACE) 100 MG capsule Take by mouth.    . esomeprazole (NEXIUM) 40 MG capsule Take 40 mg by mouth daily before breakfast.      . labetalol (NORMODYNE) 100 MG tablet Take 0.5 tablets (50 mg total) by mouth at bedtime. 15 tablet 0  . loratadine (CLARITIN) 10 MG tablet Take 1 tablet (10 mg total) by mouth daily. 30 tablet 0  . losartan (COZAAR) 50 MG tablet Take 25 mg by mouth daily.     . mometasone (NASONEX) 50 MCG/ACT  nasal spray Place 2 sprays into the nose daily.      Marland Kitchen olopatadine (PATANOL) 0.1 % ophthalmic solution Place 1 drop into both eyes 2 (two) times daily. At 6-8 hours intervals     . traMADol-acetaminophen (ULTRACET) 37.5-325 MG tablet Take by mouth.    . ferrous sulfate 325 (65 FE) MG tablet Take 325 mg by mouth 3 (three) times daily with meals. Reported on 10/13/2015     No current facility-administered medications for this visit.     Review of Systems:  GENERAL:  Feels good.  No fevers, sweats or weight loss. PERFORMANCE STATUS (ECOG):  1 HEENT:  No visual changes, runny nose, sore throat, mouth sores or tenderness. Lungs: No shortness of breath or cough.  No  hemoptysis. Cardiac:  No chest pain, palpitations, orthopnea, or PND.   GI:  No nausea, vomiting, diarrhea, constipation, melena or hematochezia. GU:  No urgency, frequency, dysuria, or hematuria. Musculoskeletal:  No back pain.  No joint pain.  No muscle tenderness. Extremities:  No pain or swelling. Skin:  No rashes or skin changes. Neuro:  No headache, numbness or weakness, balance or coordination issues. Endocrine:  No diabetes, thyroid issues, hot flashes or night sweats. Psych:  No mood changes, depression or anxiety. Pain:  No focal pain. Review of systems:  All other systems reviewed and found to be negative.   Physical Exam: Blood pressure (!) 152/88, pulse 75, temperature (!) 96.5 F (35.8 C), temperature source Tympanic, resp. rate 18, weight 273 lb 0.6 oz (123.9 kg). GENERAL:  Well developed, well nourished, elderly woman sitting comfortably in a wheelchair in the exam room in no acute distress. MENTAL STATUS:  Alert and oriented to person, place and time. HEAD:  Shoulder length gray curly hair.  Normocephalic, atraumatic, face symmetric, no Cushingoid features. EYES:  Brown eyes.  Bilateral arcus.  Pupils equal round and reactive to light and accomodation.  No conjunctivitis or scleral icterus. ENT:  Oropharynx clear without lesion.  Upper dentures.  Tongue normal. Mucous membranes moist.  RESPIRATORY:  Clear to auscultation without rales, wheezes or rhonchi. CARDIOVASCULAR:  Regular rate and rhythm without murmur, rub or gallop. ABDOMEN:  Soft, non-tender, with active bowel sounds, and no hepatosplenomegaly.  No masses. SKIN:  No rashes, ulcers or lesions. EXTREMITIES: No edema, no skin discoloration or tenderness.  No palpable cords. LYMPH NODES: No palpable cervical, supraclavicular, axillary or inguinal adenopathy  NEUROLOGICAL: Unremarkable. PSYCH:  Appropriate.    Appointment on 04/11/2016  Component Date Value Ref Range Status  . WBC 04/11/2016 4.8  3.6 - 11.0  K/uL Final  . RBC 04/11/2016 4.34  3.80 - 5.20 MIL/uL Final  . Hemoglobin 04/11/2016 12.1  12.0 - 16.0 g/dL Final  . HCT 04/11/2016 36.0  35.0 - 47.0 % Final  . MCV 04/11/2016 83.0  80.0 - 100.0 fL Final  . MCH 04/11/2016 27.8  26.0 - 34.0 pg Final  . MCHC 04/11/2016 33.5  32.0 - 36.0 g/dL Final  . RDW 04/11/2016 14.8* 11.5 - 14.5 % Final  . Platelets 04/11/2016 198  150 - 440 K/uL Final  . Neutrophils Relative % 04/11/2016 57  % Final  . Neutro Abs 04/11/2016 2.7  1.4 - 6.5 K/uL Final  . Lymphocytes Relative 04/11/2016 31  % Final  . Lymphs Abs 04/11/2016 1.5  1.0 - 3.6 K/uL Final  . Monocytes Relative 04/11/2016 10  % Final  . Monocytes Absolute 04/11/2016 0.5  0.2 - 0.9 K/uL Final  . Eosinophils Relative  04/11/2016 1  % Final  . Eosinophils Absolute 04/11/2016 0.1  0 - 0.7 K/uL Final  . Basophils Relative 04/11/2016 1  % Final  . Basophils Absolute 04/11/2016 0.0  0 - 0.1 K/uL Final    Assessment:  Nicole Bishop is a 82 y.o. African American woman with stage III chronic kidney disease and iron deficiency.  Hematocrit and MCV were normal on 09/03/2013.    She denies any GI bleeding.  Her diet is modest.  EGD on 04/10/2015 revealed a large hiatal hernia and gastritis.  Biopsy was unremarkable.  Colonoscopy on 04/10/2015 revealed diverticulosis in the sigmoid, descending, transverse, and ascending colon.  She had internal hemorrhoids.  She is B12 deficient.  She receives monthly injections at the [facility].  She is intolerant of oral iron secondary to baseline chronic constipation.   Labs on 09/12/2014 revealed a hematocrit of 30 with an MCV of 67. Ferritin was 6 (low) with a TIBC of 487 (high).  Normal studies included direct Coombs, folic acid, 123456, haptoglobin, erythropoietin, and SPEP.    She has received weekly Venofer:  800 mg (10/21/2014 - 11/11/2014), 400 mg (03/03/2015 and 03/10/2015), and 400 mg (10/20/2015 and 10/27/2015).  She receives Venofer if her ferritin is < 30.   Goal ferritin is 100.  Symptomatically, she feels good.  Exam is stable.  Hematocrit is 36.0.  Ferritin is 19.  Plan: 1.  Labs today:  CBC with diff, ferritin.   2.  Call patient's daughter with ferritin results. 3.  Continue monthly B12 at the [facility]. 4.  Discuss plan for IV iron if ferritin < 30.  Goal ferritin is 100. 5.  RTC in 3 months labs (CBC with diff, ferritin). 6.  RTC in 6 months for MD assessment, labs (CBC with diff, ferritin- day before) +/- IV iron.  Addendum:  The patient's daughter was contacted with her ferritin results.  She will be scheduled for Venofer 200 mg x 1.   Lequita Asal, MD  04/11/2016, 3:35 PM

## 2016-04-11 NOTE — Progress Notes (Signed)
Patient is here for follow up no complaints  

## 2016-04-12 ENCOUNTER — Telehealth: Payer: Self-pay | Admitting: *Deleted

## 2016-04-12 NOTE — Telephone Encounter (Signed)
Called pt and daughter [name]. And told both of them that ferritin was low and md wanted her to have venofer and that I would check with insurance to make sure venofer approved and then I can set up hte infusion. The pt said anytime next week and the daughter states tues afternoon would be good. I did tell them I am not sure if we can it done that quickly if insurance has to approve it.

## 2016-04-12 NOTE — Telephone Encounter (Signed)
-----   Message from Lequita Asal, MD sent at 04/11/2016  4:50 PM EDT ----- Regarding: Please call patient  Ferritin low (19) < 30.  Hematocrit 36.  Venofer x 1.  M  ----- Message ----- From: Interface, Lab In [place] Sent: 04/11/2016   3:14 PM To: Lequita Asal, MD

## 2016-04-15 ENCOUNTER — Telehealth: Payer: Self-pay | Admitting: *Deleted

## 2016-04-15 NOTE — Telephone Encounter (Signed)
Called daughter and pt and they are both agreeable to coming in 2 pm on tues for venofer 1 time dose.

## 2016-04-16 ENCOUNTER — Inpatient Hospital Stay: Payer: Medicare Other

## 2016-04-16 ENCOUNTER — Other Ambulatory Visit: Payer: Self-pay | Admitting: Hematology and Oncology

## 2016-04-16 VITALS — BP 165/70 | HR 78 | Temp 97.1°F | Resp 18

## 2016-04-16 DIAGNOSIS — D509 Iron deficiency anemia, unspecified: Secondary | ICD-10-CM

## 2016-04-16 DIAGNOSIS — I129 Hypertensive chronic kidney disease with stage 1 through stage 4 chronic kidney disease, or unspecified chronic kidney disease: Secondary | ICD-10-CM | POA: Diagnosis not present

## 2016-04-16 MED ORDER — SODIUM CHLORIDE 0.9 % IV SOLN
200.0000 mg | Freq: Once | INTRAVENOUS | Status: AC
  Administered 2016-04-16: 200 mg via INTRAVENOUS
  Filled 2016-04-16: qty 10

## 2016-04-16 MED ORDER — SODIUM CHLORIDE 0.9 % IV SOLN
Freq: Once | INTRAVENOUS | Status: AC
  Administered 2016-04-16: 14:00:00 via INTRAVENOUS
  Filled 2016-04-16: qty 1000

## 2016-07-18 ENCOUNTER — Inpatient Hospital Stay: Payer: Medicare Other | Attending: Hematology and Oncology

## 2016-07-18 DIAGNOSIS — Z79899 Other long term (current) drug therapy: Secondary | ICD-10-CM | POA: Diagnosis not present

## 2016-07-18 DIAGNOSIS — Z7982 Long term (current) use of aspirin: Secondary | ICD-10-CM | POA: Diagnosis not present

## 2016-07-18 DIAGNOSIS — N183 Chronic kidney disease, stage 3 (moderate): Secondary | ICD-10-CM | POA: Insufficient documentation

## 2016-07-18 DIAGNOSIS — D631 Anemia in chronic kidney disease: Secondary | ICD-10-CM | POA: Diagnosis not present

## 2016-07-18 DIAGNOSIS — E538 Deficiency of other specified B group vitamins: Secondary | ICD-10-CM | POA: Diagnosis not present

## 2016-07-18 DIAGNOSIS — K449 Diaphragmatic hernia without obstruction or gangrene: Secondary | ICD-10-CM | POA: Insufficient documentation

## 2016-07-18 DIAGNOSIS — K5730 Diverticulosis of large intestine without perforation or abscess without bleeding: Secondary | ICD-10-CM | POA: Insufficient documentation

## 2016-07-18 DIAGNOSIS — I129 Hypertensive chronic kidney disease with stage 1 through stage 4 chronic kidney disease, or unspecified chronic kidney disease: Secondary | ICD-10-CM | POA: Insufficient documentation

## 2016-07-18 DIAGNOSIS — Z87891 Personal history of nicotine dependence: Secondary | ICD-10-CM | POA: Diagnosis not present

## 2016-07-18 DIAGNOSIS — K648 Other hemorrhoids: Secondary | ICD-10-CM | POA: Insufficient documentation

## 2016-07-18 DIAGNOSIS — D509 Iron deficiency anemia, unspecified: Secondary | ICD-10-CM | POA: Diagnosis not present

## 2016-07-18 DIAGNOSIS — K5909 Other constipation: Secondary | ICD-10-CM | POA: Diagnosis not present

## 2016-07-18 LAB — CBC WITH DIFFERENTIAL/PLATELET
Basophils Absolute: 0.1 10*3/uL (ref 0–0.1)
Basophils Relative: 1 %
Eosinophils Absolute: 0.2 10*3/uL (ref 0–0.7)
Eosinophils Relative: 3 %
HCT: 35.5 % (ref 35.0–47.0)
Hemoglobin: 11.6 g/dL — ABNORMAL LOW (ref 12.0–16.0)
Lymphocytes Relative: 37 %
Lymphs Abs: 1.9 10*3/uL (ref 1.0–3.6)
MCH: 27 pg (ref 26.0–34.0)
MCHC: 32.8 g/dL (ref 32.0–36.0)
MCV: 82.1 fL (ref 80.0–100.0)
Monocytes Absolute: 0.5 10*3/uL (ref 0.2–0.9)
Monocytes Relative: 10 %
Neutro Abs: 2.5 10*3/uL (ref 1.4–6.5)
Neutrophils Relative %: 49 %
Platelets: 208 10*3/uL (ref 150–440)
RBC: 4.32 MIL/uL (ref 3.80–5.20)
RDW: 14.8 % — ABNORMAL HIGH (ref 11.5–14.5)
WBC: 5.1 10*3/uL (ref 3.6–11.0)

## 2016-07-18 LAB — FERRITIN: Ferritin: 23 ng/mL (ref 11–307)

## 2016-08-03 DIAGNOSIS — F325 Major depressive disorder, single episode, in full remission: Secondary | ICD-10-CM | POA: Insufficient documentation

## 2016-10-10 ENCOUNTER — Inpatient Hospital Stay: Payer: Medicare Other | Attending: Hematology and Oncology

## 2016-10-11 ENCOUNTER — Inpatient Hospital Stay: Payer: Medicare Other

## 2016-10-11 ENCOUNTER — Inpatient Hospital Stay: Payer: Medicare Other | Admitting: Hematology and Oncology

## 2016-10-11 NOTE — Progress Notes (Unsigned)
[facility] day:  10/11/16  Chief Complaint: Nicole Bishop is a 83 y.o. female with stage III chronic kidney disease and iron deficiency who is seen for 6 month assessment.  HPI: The patient was last seen in the medical oncology clinic on 04/11/2016.  At that time, she felt good.  Exam was stable.  Hematocrit was 36.0.  Ferritin was 19.  She received Venofer on 04/16/2016.  CBC on 07/18/2016 revealed a hematocrit of 35.5, hemoglobin 11.6 and MCV 82.1. Ferritin was 23.   Symptomatically,    Past Medical History:  Diagnosis Date  . Anemia   . Chronic kidney disease   . Hypertension   . IDA (iron deficiency anemia) 12/14/2014    Past Surgical History:  Procedure Laterality Date  . COLONOSCOPY WITH PROPOFOL N/A 04/10/2015   Procedure: COLONOSCOPY WITH PROPOFOL;  Surgeon: Manya Silvas, MD;  Location: [facility] ENDOSCOPY;  Service: Endoscopy;  Laterality: N/A;  . ESOPHAGOGASTRODUODENOSCOPY (EGD) WITH PROPOFOL N/A 04/10/2015   Procedure: ESOPHAGOGASTRODUODENOSCOPY (EGD) WITH PROPOFOL;  Surgeon: Manya Silvas, MD;  Location: [facility] ENDOSCOPY;  Service: Endoscopy;  Laterality: N/A;  . fatty tumor removed on left shoulder    . gallbladdedr removed    . REPLACEMENT TOTAL KNEE BILATERAL Bilateral     Family History  Problem Relation Age of Onset  . Arthritis Mother   . Hypertension Mother   . Hypertension Father     Social History:  reports that she has quit smoking. She has never used smokeless tobacco. She reports that she does not drink alcohol or use drugs. She is accompanied by her daughter, [name]: phone [phone] and cell [phone].  Allergies:  Allergies  Allergen Reactions  . Penicillins Rash  . Celebrex [Celecoxib] Other (See Comments)    constipation  . Felodipine Nausea Only  . Oxaprozin Nausea Only  . Penicillins Cross Reactors   . Rofecoxib Other (See Comments)    GI upset  . Atorvastatin Rash    Current  Medications: Current Outpatient Prescriptions  Medication Sig Dispense Refill  . amLODipine (NORVASC) 5 MG tablet Take 5 mg by mouth daily.     Marland Kitchen aspirin 81 MG tablet Take 81 mg by mouth every other day.      . Cholecalciferol (VITAMIN D3) 1000 UNITS CAPS Take by mouth. Reported on 10/13/2015    . citalopram (CELEXA) 10 MG tablet Take 1 tablet (10 mg total) by mouth daily. 30 tablet 2  . docusate sodium (COLACE) 100 MG capsule Take by mouth.    . esomeprazole (NEXIUM) 40 MG capsule Take 40 mg by mouth daily before breakfast.      . ferrous sulfate 325 (65 FE) MG tablet Take 325 mg by mouth 3 (three) times daily with meals. Reported on 10/13/2015    . labetalol (NORMODYNE) 100 MG tablet Take 0.5 tablets (50 mg total) by mouth at bedtime. 15 tablet 0  . loratadine (CLARITIN) 10 MG tablet Take 1 tablet (10 mg total) by mouth daily. 30 tablet 0  . losartan (COZAAR) 50 MG tablet Take 25 mg by mouth daily.     . mometasone (NASONEX) 50 MCG/ACT nasal spray Place 2 sprays into the nose daily.      Marland Kitchen olopatadine (PATANOL) 0.1 % ophthalmic solution Place 1 drop into both eyes 2 (two) times daily. At 6-8 hours intervals     . traMADol-acetaminophen (ULTRACET) 37.5-325 MG tablet Take by mouth.     No current facility-administered  medications for this visit.     Review of Systems:  GENERAL:  Feels good.  No fevers, sweats or weight loss. PERFORMANCE STATUS (ECOG):  1 HEENT:  No visual changes, runny nose, sore throat, mouth sores or tenderness. Lungs: No shortness of breath or cough.  No hemoptysis. Cardiac:  No chest pain, palpitations, orthopnea, or PND.   GI:  No nausea, vomiting, diarrhea, constipation, melena or hematochezia. GU:  No urgency, frequency, dysuria, or hematuria. Musculoskeletal:  No back pain.  No joint pain.  No muscle tenderness. Extremities:  No pain or swelling. Skin:  No rashes or skin changes. Neuro:  No headache, numbness or weakness, balance or coordination  issues. Endocrine:  No diabetes, thyroid issues, hot flashes or night sweats. Psych:  No mood changes, depression or anxiety. Pain:  No focal pain. Review of systems:  All other systems reviewed and found to be negative.   Physical Exam: There were no vitals taken for this visit. GENERAL:  Well developed, well nourished, elderly woman sitting comfortably in a wheelchair in the exam room in no acute distress. MENTAL STATUS:  Alert and oriented to person, place and time. HEAD:  Shoulder length gray curly hair.  Normocephalic, atraumatic, face symmetric, no Cushingoid features. EYES:  Brown eyes.  Bilateral arcus.  Pupils equal round and reactive to light and accomodation.  No conjunctivitis or scleral icterus. ENT:  Oropharynx clear without lesion.  Upper dentures.  Tongue normal. Mucous membranes moist.  RESPIRATORY:  Clear to auscultation without rales, wheezes or rhonchi. CARDIOVASCULAR:  Regular rate and rhythm without murmur, rub or gallop. ABDOMEN:  Soft, non-tender, with active bowel sounds, and no hepatosplenomegaly.  No masses. SKIN:  No rashes, ulcers or lesions. EXTREMITIES: No edema, no skin discoloration or tenderness.  No palpable cords. LYMPH NODES: No palpable cervical, supraclavicular, axillary or inguinal adenopathy  NEUROLOGICAL: Unremarkable. PSYCH:  Appropriate.    No visits with results within 3 Day(s) from this visit.  Latest known visit with results is:  Appointment on 07/18/2016  Component Date Value Ref Range Status  . WBC 07/18/2016 5.1  3.6 - 11.0 K/uL Final  . RBC 07/18/2016 4.32  3.80 - 5.20 MIL/uL Final  . Hemoglobin 07/18/2016 11.6* 12.0 - 16.0 g/dL Final  . HCT 07/18/2016 35.5  35.0 - 47.0 % Final  . MCV 07/18/2016 82.1  80.0 - 100.0 fL Final  . MCH 07/18/2016 27.0  26.0 - 34.0 pg Final  . MCHC 07/18/2016 32.8  32.0 - 36.0 g/dL Final  . RDW 07/18/2016 14.8* 11.5 - 14.5 % Final  . Platelets 07/18/2016 208  150 - 440 K/uL Final  . Neutrophils Relative  % 07/18/2016 49  % Final  . Neutro Abs 07/18/2016 2.5  1.4 - 6.5 K/uL Final  . Lymphocytes Relative 07/18/2016 37  % Final  . Lymphs Abs 07/18/2016 1.9  1.0 - 3.6 K/uL Final  . Monocytes Relative 07/18/2016 10  % Final  . Monocytes Absolute 07/18/2016 0.5  0.2 - 0.9 K/uL Final  . Eosinophils Relative 07/18/2016 3  % Final  . Eosinophils Absolute 07/18/2016 0.2  0 - 0.7 K/uL Final  . Basophils Relative 07/18/2016 1  % Final  . Basophils Absolute 07/18/2016 0.1  0 - 0.1 K/uL Final  . Ferritin 07/18/2016 23  11 - 307 ng/mL Final    Assessment:  Nicole Bishop is a 83 y.o. African American woman with stage III chronic kidney disease and iron deficiency.  Hematocrit and MCV were normal on  09/03/2013.    She denies any GI bleeding.  Her diet is modest.  EGD on 04/10/2015 revealed a large hiatal hernia and gastritis.  Biopsy was unremarkable.  Colonoscopy on 04/10/2015 revealed diverticulosis in the sigmoid, descending, transverse, and ascending colon.  She had internal hemorrhoids.  She is B12 deficient.  She receives monthly injections at the [facility].  She is intolerant of oral iron secondary to baseline chronic constipation.   Labs on 09/12/2014 revealed a hematocrit of 30 with an MCV of 67. Ferritin was 6 (low) with a TIBC of 487 (high).  Normal studies included direct Coombs, folic acid, D47, haptoglobin, erythropoietin, and SPEP.    She has received weekly Venofer:  800 mg (10/21/2014 - 11/11/2014), 400 mg (03/03/2015 and 03/10/2015), and 400 mg (10/20/2015 and 10/27/2015).  She receives Venofer if her ferritin is < 30.  Goal ferritin is 100.  Symptomatically, she feels good.  Exam is stable.  Hematocrit is 36.0.  Ferritin is 19.  Plan: 1.  Labs today:  CBC with diff, ferritin, iron studies.  2.  Call patient's daughter with ferritin results. 3.  Continue monthly B12 at the [facility]. 4.  Discuss plan for IV iron if ferritin < 30.  Goal ferritin is 100. 5.  RTC in 3  months labs (CBC with diff, ferritin). 6.  RTC in 6 months for MD assessment, labs (CBC with diff, ferritin- day before) +/- IV iron.  Addendum:  The patient's daughter was contacted with her ferritin results.  She will be scheduled for Venofer 200 mg x 1.   Lequita Asal, MD  10/11/2016, 5:03 AM

## 2016-10-25 ENCOUNTER — Other Ambulatory Visit: Payer: Self-pay | Admitting: *Deleted

## 2016-10-25 DIAGNOSIS — D509 Iron deficiency anemia, unspecified: Secondary | ICD-10-CM

## 2016-10-28 ENCOUNTER — Inpatient Hospital Stay: Payer: Medicare Other | Admitting: Hematology and Oncology

## 2016-10-28 ENCOUNTER — Inpatient Hospital Stay: Payer: Medicare Other

## 2016-10-28 NOTE — Progress Notes (Unsigned)
[facility] day:  10/28/16  Chief Complaint: Nicole Bishop is a 83 y.o. female with stage III chronic kidney disease and iron deficiency who is seen for 6 month assessment.  HPI: The patient was last seen in the medical oncology clinic on 04/11/2016.  At that time, she felt good.  Exam was stable.  Hematocrit was 36.0.  Ferritin was 19.  She continued monthly B12 at the [facility].  She received Venofer 200 mg IV on 04/16/2016.  Labs on 07/18/2016 revealed a hematocrit of 35.5, hemoglobin 11.6 and MCV 82.1. Ferritin was 23.      Past Medical History:  Diagnosis Date  . Anemia   . Chronic kidney disease   . Hypertension   . IDA (iron deficiency anemia) 12/14/2014    Past Surgical History:  Procedure Laterality Date  . COLONOSCOPY WITH PROPOFOL N/A 04/10/2015   Procedure: COLONOSCOPY WITH PROPOFOL;  Surgeon: Manya Silvas, MD;  Location: [facility] ENDOSCOPY;  Service: Endoscopy;  Laterality: N/A;  . ESOPHAGOGASTRODUODENOSCOPY (EGD) WITH PROPOFOL N/A 04/10/2015   Procedure: ESOPHAGOGASTRODUODENOSCOPY (EGD) WITH PROPOFOL;  Surgeon: Manya Silvas, MD;  Location: [facility] ENDOSCOPY;  Service: Endoscopy;  Laterality: N/A;  . fatty tumor removed on left shoulder    . gallbladdedr removed    . REPLACEMENT TOTAL KNEE BILATERAL Bilateral     Family History  Problem Relation Age of Onset  . Arthritis Mother   . Hypertension Mother   . Hypertension Father     Social History:  reports that she has quit smoking. She has never used smokeless tobacco. She reports that she does not drink alcohol or use drugs. She is accompanied by her daughter, Nicole Bishop: phone [phone] and cell [phone].  Allergies:  Allergies  Allergen Reactions  . Penicillins Rash  . Celebrex [Celecoxib] Other (See Comments)    constipation  . Felodipine Nausea Only  . Oxaprozin Nausea Only  . Penicillins Cross Reactors   . Rofecoxib Other (See Comments)    GI  upset  . Atorvastatin Rash    Current Medications: Current Outpatient Prescriptions  Medication Sig Dispense Refill  . amLODipine (NORVASC) 5 MG tablet Take 5 mg by mouth daily.     Marland Kitchen aspirin 81 MG tablet Take 81 mg by mouth every other day.      . Cholecalciferol (VITAMIN D3) 1000 UNITS CAPS Take by mouth. Reported on 10/13/2015    . citalopram (CELEXA) 10 MG tablet Take 1 tablet (10 mg total) by mouth daily. 30 tablet 2  . docusate sodium (COLACE) 100 MG capsule Take by mouth.    . esomeprazole (NEXIUM) 40 MG capsule Take 40 mg by mouth daily before breakfast.      . ferrous sulfate 325 (65 FE) MG tablet Take 325 mg by mouth 3 (three) times daily with meals. Reported on 10/13/2015    . labetalol (NORMODYNE) 100 MG tablet Take 0.5 tablets (50 mg total) by mouth at bedtime. 15 tablet 0  . loratadine (CLARITIN) 10 MG tablet Take 1 tablet (10 mg total) by mouth daily. 30 tablet 0  . losartan (COZAAR) 50 MG tablet Take 25 mg by mouth daily.     . mometasone (NASONEX) 50 MCG/ACT nasal spray Place 2 sprays into the nose daily.      Marland Kitchen olopatadine (PATANOL) 0.1 % ophthalmic solution Place 1 drop into both eyes 2 (two) times daily. At 6-8 hours intervals     . traMADol-acetaminophen (ULTRACET) 37.5-325 MG  tablet Take by mouth.     No current facility-administered medications for this visit.     Review of Systems:  GENERAL:  Feels good.  No fevers, sweats or weight loss. PERFORMANCE STATUS (ECOG):  1 HEENT:  No visual changes, runny nose, sore throat, mouth sores or tenderness. Lungs: No shortness of breath or cough.  No hemoptysis. Cardiac:  No chest pain, palpitations, orthopnea, or PND.   GI:  No nausea, vomiting, diarrhea, constipation, melena or hematochezia. GU:  No urgency, frequency, dysuria, or hematuria. Musculoskeletal:  No back pain.  No joint pain.  No muscle tenderness. Extremities:  No pain or swelling. Skin:  No rashes or skin changes. Neuro:  No headache, numbness or  weakness, balance or coordination issues. Endocrine:  No diabetes, thyroid issues, hot flashes or night sweats. Psych:  No mood changes, depression or anxiety. Pain:  No focal pain. Review of systems:  All other systems reviewed and found to be negative.   Physical Exam: There were no vitals taken for this visit. GENERAL:  Well developed, well nourished, elderly woman sitting comfortably in a wheelchair in the exam room in no acute distress. MENTAL STATUS:  Alert and oriented to person, place and time. HEAD:  Shoulder length gray curly hair.  Normocephalic, atraumatic, face symmetric, no Cushingoid features. EYES:  Brown eyes.  Bilateral arcus.  Pupils equal round and reactive to light and accomodation.  No conjunctivitis or scleral icterus. ENT:  Oropharynx clear without lesion.  Upper dentures.  Tongue normal. Mucous membranes moist.  RESPIRATORY:  Clear to auscultation without rales, wheezes or rhonchi. CARDIOVASCULAR:  Regular rate and rhythm without murmur, rub or gallop. ABDOMEN:  Soft, non-tender, with active bowel sounds, and no hepatosplenomegaly.  No masses. SKIN:  No rashes, ulcers or lesions. EXTREMITIES: No edema, no skin discoloration or tenderness.  No palpable cords. LYMPH NODES: No palpable cervical, supraclavicular, axillary or inguinal adenopathy  NEUROLOGICAL: Unremarkable. PSYCH:  Appropriate.    No visits with results within 3 Day(s) from this visit.  Latest known visit with results is:  Appointment on 07/18/2016  Component Date Value Ref Range Status  . WBC 07/18/2016 5.1  3.6 - 11.0 K/uL Final  . RBC 07/18/2016 4.32  3.80 - 5.20 MIL/uL Final  . Hemoglobin 07/18/2016 11.6* 12.0 - 16.0 g/dL Final  . HCT 07/18/2016 35.5  35.0 - 47.0 % Final  . MCV 07/18/2016 82.1  80.0 - 100.0 fL Final  . MCH 07/18/2016 27.0  26.0 - 34.0 pg Final  . MCHC 07/18/2016 32.8  32.0 - 36.0 g/dL Final  . RDW 07/18/2016 14.8* 11.5 - 14.5 % Final  . Platelets 07/18/2016 208  150 - 440  K/uL Final  . Neutrophils Relative % 07/18/2016 49  % Final  . Neutro Abs 07/18/2016 2.5  1.4 - 6.5 K/uL Final  . Lymphocytes Relative 07/18/2016 37  % Final  . Lymphs Abs 07/18/2016 1.9  1.0 - 3.6 K/uL Final  . Monocytes Relative 07/18/2016 10  % Final  . Monocytes Absolute 07/18/2016 0.5  0.2 - 0.9 K/uL Final  . Eosinophils Relative 07/18/2016 3  % Final  . Eosinophils Absolute 07/18/2016 0.2  0 - 0.7 K/uL Final  . Basophils Relative 07/18/2016 1  % Final  . Basophils Absolute 07/18/2016 0.1  0 - 0.1 K/uL Final  . Ferritin 07/18/2016 23  11 - 307 ng/mL Final    Assessment:  Nicole Bishop is a 83 y.o. African American woman with stage III chronic kidney  disease and iron deficiency.  Hematocrit and MCV were normal on 09/03/2013.    She denies any GI bleeding.  Diet is modest.  EGD on 04/10/2015 revealed a large hiatal hernia and gastritis.  Biopsy was unremarkable.  Colonoscopy on 04/10/2015 revealed diverticulosis in the sigmoid, descending, transverse, and ascending colon.  She had internal hemorrhoids.  She is B12 deficient.  She receives monthly injections at the [facility].  She is intolerant of oral iron secondary to baseline chronic constipation.   Labs on 09/12/2014 revealed a hematocrit of 30 with an MCV of 67. Ferritin was 6 (low) with a TIBC of 487 (high).  Normal studies included direct Coombs, folic acid, R42, haptoglobin, erythropoietin, and SPEP.    She has received weekly Venofer:  800 mg (10/21/2014 - 11/11/2014), 400 mg (03/03/2015 and 03/10/2015), 400 mg (10/20/2015 and 10/27/2015), and 200 mg (04/16/2016).  She receives Venofer if her ferritin is < 30.    Ferritin has been followed:  14 on 12/28/2014, 9 on 02/23/2015, 113 on 03/17/2015, 12 on 10/13/2015, 31 on 01/10/2016, 19 on 04/11/2016, and 23 on 07/18/2016. Goal ferritin is 100.  Symptomatically, she feels good.  Exam is stable.  Hematocrit is 36.0.  Ferritin is 19.  Plan: 1.  Labs today:  CBC with diff,  ferritin, iron studies.    2.  Call patient's daughter with ferritin results. 3.  Continue monthly B12 at the [facility]. 4.  Discuss plan for IV iron if ferritin < 30.  Goal ferritin is 100. 5.  RTC in 3 months labs (CBC with diff, ferritin). 6.  RTC in 6 months for MD assessment, labs (CBC with diff, ferritin- day before) +/- IV iron.   Lequita Asal, MD  10/28/2016, 5:15 AM

## 2016-10-30 DIAGNOSIS — R0602 Shortness of breath: Secondary | ICD-10-CM | POA: Insufficient documentation

## 2016-11-11 ENCOUNTER — Inpatient Hospital Stay: Payer: Medicare Other

## 2016-11-11 ENCOUNTER — Encounter: Payer: Self-pay | Admitting: Hematology and Oncology

## 2016-11-11 ENCOUNTER — Inpatient Hospital Stay: Payer: Medicare Other | Attending: Hematology and Oncology

## 2016-11-11 ENCOUNTER — Inpatient Hospital Stay (HOSPITAL_BASED_OUTPATIENT_CLINIC_OR_DEPARTMENT_OTHER): Payer: Medicare Other | Admitting: Hematology and Oncology

## 2016-11-11 VITALS — BP 140/85 | HR 70 | Temp 97.2°F | Resp 18 | Wt 266.1 lb

## 2016-11-11 DIAGNOSIS — D509 Iron deficiency anemia, unspecified: Secondary | ICD-10-CM | POA: Diagnosis not present

## 2016-11-11 DIAGNOSIS — I129 Hypertensive chronic kidney disease with stage 1 through stage 4 chronic kidney disease, or unspecified chronic kidney disease: Secondary | ICD-10-CM

## 2016-11-11 DIAGNOSIS — N183 Chronic kidney disease, stage 3 (moderate): Secondary | ICD-10-CM | POA: Insufficient documentation

## 2016-11-11 DIAGNOSIS — Z79899 Other long term (current) drug therapy: Secondary | ICD-10-CM

## 2016-11-11 DIAGNOSIS — Z7982 Long term (current) use of aspirin: Secondary | ICD-10-CM | POA: Insufficient documentation

## 2016-11-11 DIAGNOSIS — Z87891 Personal history of nicotine dependence: Secondary | ICD-10-CM | POA: Diagnosis not present

## 2016-11-11 DIAGNOSIS — Z96653 Presence of artificial knee joint, bilateral: Secondary | ICD-10-CM | POA: Diagnosis not present

## 2016-11-11 DIAGNOSIS — E538 Deficiency of other specified B group vitamins: Secondary | ICD-10-CM | POA: Diagnosis not present

## 2016-11-11 LAB — CBC WITH DIFFERENTIAL/PLATELET
Basophils Absolute: 0 10*3/uL (ref 0–0.1)
Basophils Relative: 1 %
Eosinophils Absolute: 0.2 10*3/uL (ref 0–0.7)
Eosinophils Relative: 3 %
HCT: 34.3 % — ABNORMAL LOW (ref 35.0–47.0)
Hemoglobin: 11.2 g/dL — ABNORMAL LOW (ref 12.0–16.0)
Lymphocytes Relative: 34 %
Lymphs Abs: 1.6 10*3/uL (ref 1.0–3.6)
MCH: 26.4 pg (ref 26.0–34.0)
MCHC: 32.6 g/dL (ref 32.0–36.0)
MCV: 81.1 fL (ref 80.0–100.0)
Monocytes Absolute: 0.5 10*3/uL (ref 0.2–0.9)
Monocytes Relative: 9 %
Neutro Abs: 2.5 10*3/uL (ref 1.4–6.5)
Neutrophils Relative %: 53 %
Platelets: 216 10*3/uL (ref 150–440)
RBC: 4.23 MIL/uL (ref 3.80–5.20)
RDW: 15.7 % — ABNORMAL HIGH (ref 11.5–14.5)
WBC: 4.8 10*3/uL (ref 3.6–11.0)

## 2016-11-11 LAB — IRON AND TIBC
Iron: 35 ug/dL (ref 28–170)
Saturation Ratios: 8 % — ABNORMAL LOW (ref 10.4–31.8)
TIBC: 421 ug/dL (ref 250–450)
UIBC: 386 ug/dL

## 2016-11-11 LAB — FERRITIN: Ferritin: 9 ng/mL — ABNORMAL LOW (ref 11–307)

## 2016-11-11 MED ORDER — SODIUM CHLORIDE 0.9 % IV SOLN: 200.0000 mg | Freq: Once | INTRAVENOUS | Status: DC

## 2016-11-11 MED ORDER — SODIUM CHLORIDE 0.9 % IV SOLN
Freq: Once | INTRAVENOUS | Status: AC
  Administered 2016-11-11: 15:00:00 via INTRAVENOUS
  Filled 2016-11-11: qty 1000

## 2016-11-11 MED ORDER — IRON SUCROSE 20 MG/ML IV SOLN
200.0000 mg | Freq: Once | INTRAVENOUS | Status: AC
  Administered 2016-11-11: 200 mg via INTRAVENOUS
  Filled 2016-11-11: qty 10

## 2016-11-11 NOTE — Progress Notes (Signed)
Patient states on Thursday she had an episode of SOB.  States she laid around a lot and finally took am aspirin and it improved. Patient does have ice PICA.

## 2016-11-11 NOTE — Progress Notes (Signed)
[facility] day:  11/11/16  Chief Complaint: SHANTINA CHRONISTER is a 83 y.o. female with stage III chronic kidney disease and iron deficiency who is seen for 6 month assessment.  HPI: The patient was last seen in the medical oncology clinic on 04/11/2016.  At that time, she felt good.  Exam was stable.  Hematocrit was 36.0.  Ferritin was 19.  She continued monthly B12 at the [facility].  She received Venofer 200 mg IV on 04/16/2016.  Labs on 07/18/2016 revealed a hematocrit of 35.5, hemoglobin 11.6 and MCV 82.1. Ferritin was 23.   She missed her appointment on 10/28/2016 secondary to the weather.  Symptomatically, she denies any complaint.  She stopped taking B12 shots in 08/2016 (last taken in 07/2016) as her "B12 level looked good".  She is now taking oral B12.  She denies any melena, hematochezia, or hematuria.  She has ice pica.   Past Medical History:  Diagnosis Date  . Anemia   . Chronic kidney disease   . Hypertension   . IDA (iron deficiency anemia) 12/14/2014    Past Surgical History:  Procedure Laterality Date  . COLONOSCOPY WITH PROPOFOL N/A 04/10/2015   Procedure: COLONOSCOPY WITH PROPOFOL;  Surgeon: Manya Silvas, MD;  Location: [facility] ENDOSCOPY;  Service: Endoscopy;  Laterality: N/A;  . ESOPHAGOGASTRODUODENOSCOPY (EGD) WITH PROPOFOL N/A 04/10/2015   Procedure: ESOPHAGOGASTRODUODENOSCOPY (EGD) WITH PROPOFOL;  Surgeon: Manya Silvas, MD;  Location: [facility] ENDOSCOPY;  Service: Endoscopy;  Laterality: N/A;  . fatty tumor removed on left shoulder    . gallbladdedr removed    . REPLACEMENT TOTAL KNEE BILATERAL Bilateral     Family History  Problem Relation Age of Onset  . Arthritis Mother   . Hypertension Mother   . Hypertension Father     Social History:  reports that she has quit smoking. She has never used smokeless tobacco. She reports that she does not drink alcohol or use drugs. The patient lives in [place].  She is  accompanied by her daughter, [name]: phone [phone] and cell [phone].  Allergies:  Allergies  Allergen Reactions  . Penicillins Rash  . Celebrex [Celecoxib] Other (See Comments)    constipation  . Felodipine Nausea Only  . Oxaprozin Nausea Only  . Penicillins Cross Reactors   . Rofecoxib Other (See Comments)    GI upset  . Atorvastatin Rash    Current Medications: Current Outpatient Prescriptions  Medication Sig Dispense Refill  . amLODipine (NORVASC) 5 MG tablet Take 5 mg by mouth daily.     Marland Kitchen aspirin 81 MG tablet Take 81 mg by mouth every other day.      . Cholecalciferol (VITAMIN D3) 1000 UNITS CAPS Take by mouth. Reported on 10/13/2015    . citalopram (CELEXA) 10 MG tablet Take 1 tablet (10 mg total) by mouth daily. 30 tablet 2  . cyanocobalamin (,VITAMIN B-12,) 1000 MCG/ML injection Inject into the muscle.    . docusate sodium (COLACE) 100 MG capsule Take by mouth.    . esomeprazole (NEXIUM) 40 MG capsule Take 40 mg by mouth daily before breakfast.      . labetalol (NORMODYNE) 100 MG tablet Take 0.5 tablets (50 mg total) by mouth at bedtime. 15 tablet 0  . loratadine (CLARITIN) 10 MG tablet Take 1 tablet (10 mg total) by mouth daily. 30 tablet 0  . losartan (COZAAR) 50 MG tablet Take 25 mg by mouth daily.     Marland Kitchen  mometasone (NASONEX) 50 MCG/ACT nasal spray Place 2 sprays into the nose daily.      Marland Kitchen olopatadine (PATANOL) 0.1 % ophthalmic solution Place 1 drop into both eyes 2 (two) times daily. At 6-8 hours intervals     . traMADol-acetaminophen (ULTRACET) 37.5-325 MG tablet Take by mouth.    . vitamin B-12 (CYANOCOBALAMIN) 500 MCG tablet Take 500 mcg by mouth daily.    . ferrous sulfate 325 (65 FE) MG tablet Take 325 mg by mouth 3 (three) times daily with meals. Reported on 10/13/2015     No current facility-administered medications for this visit.     Review of Systems:  GENERAL:  Feels good.  No fevers or sweats.  Weight down 7 pounds. PERFORMANCE STATUS (ECOG):   1 HEENT:  No visual changes, runny nose, sore throat, mouth sores or tenderness. Lungs: No shortness of breath or cough.  No hemoptysis. Cardiac:  No chest pain, palpitations, orthopnea, or PND.   GI:  No nausea, vomiting, diarrhea, constipation, melena or hematochezia. GU:  No urgency, frequency, dysuria, or hematuria. Musculoskeletal:  No back pain.  No joint pain.  No muscle tenderness. Extremities:  No pain or swelling. Skin:  No rashes or skin changes. Neuro:  No headache, numbness or weakness, balance or coordination issues. Endocrine:  No diabetes, thyroid issues, hot flashes or night sweats. Psych:  No mood changes, depression or anxiety. Pain:  No focal pain. Review of systems:  All other systems reviewed and found to be negative.   Physical Exam: Blood pressure (!) 161/82, pulse 71, temperature 97.2 F (36.2 C), temperature source Tympanic, resp. rate 18, weight 266 lb 1 oz (120.7 kg). GENERAL:  Well developed, well nourished, elderly woman sitting comfortably in a wheelchair in the exam room in no acute distress. MENTAL STATUS:  Alert and oriented to person, place and time. HEAD:  Shoulder length gray curly hair.  Normocephalic, atraumatic, face symmetric, no Cushingoid features. EYES:  Brown eyes.  Bilateral arcus.  Pupils equal round and reactive to light and accomodation.  No conjunctivitis or scleral icterus. ENT:  Oropharynx clear without lesion.  Upper dentures.  Tongue normal. Mucous membranes moist.  RESPIRATORY:  Clear to auscultation without rales, wheezes or rhonchi. CARDIOVASCULAR:  Regular rate and rhythm without murmur, rub or gallop. ABDOMEN:  Soft, non-tender, with active bowel sounds, and no hepatosplenomegaly.  No masses. SKIN:  No rashes, ulcers or lesions. EXTREMITIES: No edema, no skin discoloration or tenderness.  No palpable cords. LYMPH NODES: No palpable cervical, supraclavicular, axillary or inguinal adenopathy  NEUROLOGICAL: Unremarkable. PSYCH:   Appropriate.    Appointment on 11/11/2016  Component Date Value Ref Range Status  . WBC 11/11/2016 4.8  3.6 - 11.0 K/uL Final  . RBC 11/11/2016 4.23  3.80 - 5.20 MIL/uL Final  . Hemoglobin 11/11/2016 11.2* 12.0 - 16.0 g/dL Final  . HCT 11/11/2016 34.3* 35.0 - 47.0 % Final  . MCV 11/11/2016 81.1  80.0 - 100.0 fL Final  . MCH 11/11/2016 26.4  26.0 - 34.0 pg Final  . MCHC 11/11/2016 32.6  32.0 - 36.0 g/dL Final  . RDW 11/11/2016 15.7* 11.5 - 14.5 % Final  . Platelets 11/11/2016 216  150 - 440 K/uL Final  . Neutrophils Relative % 11/11/2016 53  % Final  . Neutro Abs 11/11/2016 2.5  1.4 - 6.5 K/uL Final  . Lymphocytes Relative 11/11/2016 34  % Final  . Lymphs Abs 11/11/2016 1.6  1.0 - 3.6 K/uL Final  . Monocytes Relative 11/11/2016  9  % Final  . Monocytes Absolute 11/11/2016 0.5  0.2 - 0.9 K/uL Final  . Eosinophils Relative 11/11/2016 3  % Final  . Eosinophils Absolute 11/11/2016 0.2  0 - 0.7 K/uL Final  . Basophils Relative 11/11/2016 1  % Final  . Basophils Absolute 11/11/2016 0.0  0 - 0.1 K/uL Final  . Ferritin 11/11/2016 9* 11 - 307 ng/mL Final  . Iron 11/11/2016 35  28 - 170 ug/dL Final  . TIBC 11/11/2016 421  250 - 450 ug/dL Final  . Saturation Ratios 11/11/2016 8* 10.4 - 31.8 % Final  . UIBC 11/11/2016 386  ug/dL Final    Assessment:  KARENA KINKER is a 83 y.o. African American woman with stage III chronic kidney disease and iron deficiency.  Hematocrit and MCV were normal on 09/03/2013.    She denies any GI bleeding.  Diet is modest.  EGD on 04/10/2015 revealed a large hiatal hernia and gastritis.  Biopsy was unremarkable.  Colonoscopy on 04/10/2015 revealed diverticulosis in the sigmoid, descending, transverse, and ascending colon.  She had internal hemorrhoids.  She is B12 deficient.  She received monthly injections at the [facility] (last 07/2016).  She began oral B12 in 08/2016.  She is intolerant of oral iron secondary to baseline chronic constipation.   Labs on  09/12/2014 revealed a hematocrit of 30 with an MCV of 67. Ferritin was 6 (low) with a TIBC of 487 (high).  Normal studies included direct Coombs, folic acid, Y77, haptoglobin, erythropoietin, and SPEP.    She has received weekly Venofer:  800 mg (10/21/2014 - 11/11/2014), 400 mg (03/03/2015 and 03/10/2015), 400 mg (10/20/2015 and 10/27/2015), and 200 mg (04/16/2016).  She receives Venofer if her ferritin is < 30.    Ferritin has been followed:  14 on 12/28/2014, 9 on 02/23/2015, 113 on 03/17/2015, 12 on 10/13/2015, 31 on 01/10/2016, 19 on 04/11/2016, 23 on 07/18/2016, and 9 on 11/11/2016.  Goal ferritin is 100.  Symptomatically, she feels good.  Exam is stable.  Hematocrit is 36.0.  Ferritin is 19.  Plan: 1.  Labs today:  CBC with diff, ferritin, iron studies.   2.  Discuss recurrent iron deficiency.  Etiology is unclear.  Discuss follow-up with Dr. Vira Agar (GI).  Discuss restarting Venofer weekly. 3.  Discuss follow-up B12 level on oral B12. 4.  Venofer today and weekly x 2 5.  Patient to follow-up with gastroenterology. 6.  RTC in 3 months labs (CBC with diff, ferritin, B12). 7.  RTC in 6 months for MD assessment, labs (CBC with diff, ferritin- day before) +/- IV iron.   Lequita Asal, MD  11/11/2016, 2:44 PM

## 2016-11-18 ENCOUNTER — Inpatient Hospital Stay: Payer: Medicare Other | Attending: Hematology and Oncology

## 2016-11-18 ENCOUNTER — Other Ambulatory Visit: Payer: Self-pay | Admitting: Hematology and Oncology

## 2016-11-18 VITALS — BP 138/84 | HR 65 | Temp 98.9°F | Resp 18

## 2016-11-18 DIAGNOSIS — I129 Hypertensive chronic kidney disease with stage 1 through stage 4 chronic kidney disease, or unspecified chronic kidney disease: Secondary | ICD-10-CM | POA: Insufficient documentation

## 2016-11-18 DIAGNOSIS — Z87891 Personal history of nicotine dependence: Secondary | ICD-10-CM | POA: Insufficient documentation

## 2016-11-18 DIAGNOSIS — E538 Deficiency of other specified B group vitamins: Secondary | ICD-10-CM | POA: Diagnosis not present

## 2016-11-18 DIAGNOSIS — N183 Chronic kidney disease, stage 3 (moderate): Secondary | ICD-10-CM | POA: Insufficient documentation

## 2016-11-18 DIAGNOSIS — Z79899 Other long term (current) drug therapy: Secondary | ICD-10-CM | POA: Diagnosis not present

## 2016-11-18 DIAGNOSIS — D509 Iron deficiency anemia, unspecified: Secondary | ICD-10-CM | POA: Diagnosis present

## 2016-11-18 DIAGNOSIS — Z7982 Long term (current) use of aspirin: Secondary | ICD-10-CM | POA: Insufficient documentation

## 2016-11-18 DIAGNOSIS — Z96653 Presence of artificial knee joint, bilateral: Secondary | ICD-10-CM | POA: Diagnosis not present

## 2016-11-18 MED ORDER — SODIUM CHLORIDE 0.9 % IV SOLN
Freq: Once | INTRAVENOUS | Status: AC
  Administered 2016-11-18: 14:00:00 via INTRAVENOUS
  Filled 2016-11-18: qty 1000

## 2016-11-18 MED ORDER — IRON SUCROSE 20 MG/ML IV SOLN
200.0000 mg | Freq: Once | INTRAVENOUS | Status: AC
  Administered 2016-11-18: 200 mg via INTRAVENOUS
  Filled 2016-11-18: qty 10

## 2016-11-18 MED ORDER — SODIUM CHLORIDE 0.9 % IV SOLN: 200.0000 mg | Freq: Once | INTRAVENOUS | Status: DC

## 2016-11-25 ENCOUNTER — Inpatient Hospital Stay: Payer: Medicare Other

## 2016-11-25 VITALS — BP 125/80 | HR 63 | Temp 97.2°F | Resp 20

## 2016-11-25 DIAGNOSIS — D509 Iron deficiency anemia, unspecified: Secondary | ICD-10-CM

## 2016-11-25 MED ORDER — SODIUM CHLORIDE 0.9 % IV SOLN: 200.0000 mg | Freq: Once | INTRAVENOUS | Status: DC

## 2016-11-25 MED ORDER — SODIUM CHLORIDE 0.9 % IV SOLN
Freq: Once | INTRAVENOUS | Status: AC
  Administered 2016-11-25: 14:00:00 via INTRAVENOUS
  Filled 2016-11-25: qty 1000

## 2016-11-25 MED ORDER — IRON SUCROSE 20 MG/ML IV SOLN
200.0000 mg | Freq: Once | INTRAVENOUS | Status: AC
  Administered 2016-11-25: 200 mg via INTRAVENOUS
  Filled 2016-11-25: qty 10

## 2016-11-28 ENCOUNTER — Telehealth: Payer: Self-pay | Admitting: *Deleted

## 2016-11-28 NOTE — Telephone Encounter (Signed)
Dr. Percell Boston nurse called and reported that he would not recommend any procedures at this time and that he would recommend to do 1-2 sets of stool cards.  Fax received.  Given to Dr. Mike Gip

## 2016-12-02 ENCOUNTER — Inpatient Hospital Stay: Payer: Medicare Other

## 2017-01-19 ENCOUNTER — Encounter: Payer: Self-pay | Admitting: Emergency Medicine

## 2017-01-19 ENCOUNTER — Emergency Department: Payer: Medicare Other

## 2017-01-19 ENCOUNTER — Emergency Department
Admission: EM | Admit: 2017-01-19 | Discharge: 2017-01-20 | Disposition: A | Discharge status: Home / Self Care | Payer: Medicare Other | Attending: Emergency Medicine | Admitting: Emergency Medicine

## 2017-01-19 DIAGNOSIS — Z7982 Long term (current) use of aspirin: Secondary | ICD-10-CM | POA: Diagnosis not present

## 2017-01-19 DIAGNOSIS — I129 Hypertensive chronic kidney disease with stage 1 through stage 4 chronic kidney disease, or unspecified chronic kidney disease: Secondary | ICD-10-CM | POA: Diagnosis not present

## 2017-01-19 DIAGNOSIS — Z79899 Other long term (current) drug therapy: Secondary | ICD-10-CM | POA: Diagnosis not present

## 2017-01-19 DIAGNOSIS — I714 Abdominal aortic aneurysm, without rupture, unspecified: Secondary | ICD-10-CM

## 2017-01-19 DIAGNOSIS — Z96653 Presence of artificial knee joint, bilateral: Secondary | ICD-10-CM | POA: Insufficient documentation

## 2017-01-19 DIAGNOSIS — N183 Chronic kidney disease, stage 3 (moderate): Secondary | ICD-10-CM | POA: Diagnosis not present

## 2017-01-19 DIAGNOSIS — R109 Unspecified abdominal pain: Secondary | ICD-10-CM

## 2017-01-19 DIAGNOSIS — R1031 Right lower quadrant pain: Secondary | ICD-10-CM

## 2017-01-19 HISTORY — DX: Disorder of kidney and ureter, unspecified: N28.9

## 2017-01-19 LAB — URINALYSIS, COMPLETE (UACMP) WITH MICROSCOPIC
BACTERIA UA: NONE SEEN
Bilirubin Urine: NEGATIVE
GLUCOSE, UA: NEGATIVE mg/dL
Hgb urine dipstick: NEGATIVE
Ketones, ur: NEGATIVE mg/dL
Leukocytes, UA: NEGATIVE
Nitrite: NEGATIVE
PROTEIN: NEGATIVE mg/dL
Specific Gravity, Urine: 1.015 (ref 1.005–1.030)
pH: 6 (ref 5.0–8.0)

## 2017-01-19 LAB — CBC
HEMATOCRIT: 36.8 % (ref 35.0–47.0)
Hemoglobin: 12.1 g/dL (ref 12.0–16.0)
MCH: 27.1 pg (ref 26.0–34.0)
MCHC: 33 g/dL (ref 32.0–36.0)
MCV: 81.9 fL (ref 80.0–100.0)
PLATELETS: 240 10*3/uL (ref 150–440)
RBC: 4.49 MIL/uL (ref 3.80–5.20)
RDW: 16.1 % — ABNORMAL HIGH (ref 11.5–14.5)
WBC: 6.9 10*3/uL (ref 3.6–11.0)

## 2017-01-19 LAB — COMPREHENSIVE METABOLIC PANEL
ALT: 10 U/L — AB (ref 14–54)
AST: 16 U/L (ref 15–41)
Albumin: 3.7 g/dL (ref 3.5–5.0)
Alkaline Phosphatase: 74 U/L (ref 38–126)
Anion gap: 8 (ref 5–15)
BUN: 17 mg/dL (ref 6–20)
CHLORIDE: 106 mmol/L (ref 101–111)
CO2: 22 mmol/L (ref 22–32)
CREATININE: 1.17 mg/dL — AB (ref 0.44–1.00)
Calcium: 9.1 mg/dL (ref 8.9–10.3)
GFR calc non Af Amer: 42 mL/min — ABNORMAL LOW (ref >60)
GFR, EST AFRICAN AMERICAN: 49 mL/min — AB (ref >60)
Glucose, Bld: 108 mg/dL — ABNORMAL HIGH (ref 65–99)
Potassium: 3.7 mmol/L (ref 3.5–5.1)
SODIUM: 136 mmol/L (ref 135–145)
Total Bilirubin: 0.9 mg/dL (ref 0.3–1.2)
Total Protein: 7.1 g/dL (ref 6.5–8.1)

## 2017-01-19 LAB — LIPASE, BLOOD: LIPASE: 18 U/L (ref 11–51)

## 2017-01-19 MED ORDER — IOPAMIDOL (ISOVUE-300) INJECTION 61%
100.0000 mL | Freq: Once | INTRAVENOUS | Status: AC | PRN
  Administered 2017-01-19: 100 mL via INTRAVENOUS

## 2017-01-19 MED ORDER — IOPAMIDOL (ISOVUE-300) INJECTION 61%
30.0000 mL | Freq: Once | INTRAVENOUS | Status: AC | PRN
  Administered 2017-01-19: 30 mL via ORAL

## 2017-01-19 NOTE — ED Triage Notes (Signed)
Patient with complaint of right side abdominal pain that started today. Patient denies nausea or urinary symptoms. Patient states that he last BM Tuesday.

## 2017-01-19 NOTE — ED Notes (Signed)
Nicole Bishop, ed tech in to encourage pt to urinate.

## 2017-01-19 NOTE — ED Notes (Signed)
Pt and family updated on plan of care. Pt declines offer for pain medication.

## 2017-01-19 NOTE — ED Notes (Signed)
Pt has finished po contrast. Ct notified.

## 2017-01-19 NOTE — ED Provider Notes (Signed)
[facility] Emergency Department Provider Note   ____________________________________________   I have reviewed the triage vital signs and the nursing notes.   HISTORY  Chief Complaint Abdominal Pain   History limited by: Not Limited   HPI Nicole Bishop is a 83 y.o. female who presents to the emergency department today because of concern for abdominal pain. It is located on the right side. It started yesterday. It has been fairly constant. She describes it as a "twisting" sensation. It is worse depending on movement and position. She has not had any significant nausea or vomiting. She denies any fevers. She feels like she has had increased urinary frequency. No bad odor to her urine. She states she has had her gallbladder removed.    Past Medical History:  Diagnosis Date  . Anemia   . Chronic kidney disease   . Hypertension   . IDA (iron deficiency anemia) 12/14/2014    Patient Active Problem List   Diagnosis Date Noted  . Kidney failure 03/17/2015  . Chronic kidney disease (CKD), stage III (moderate) 12/22/2014  . Anemia, iron deficiency 12/22/2014  . IDA (iron deficiency anemia) 12/14/2014  . Chronic constipation 06/03/2014  . Essential (primary) hypertension 05/03/2014  . Acid reflux 04/20/2014  . H/O: obesity 04/20/2014  . Airway hyperreactivity 04/20/2014  . Arthritis of shoulder region, degenerative 01/27/2014  . B12 deficiency 12/27/2013  . Dizziness 03/13/2012  . Back ache 11/04/2011  . Avitaminosis D 11/04/2011  . Combined fat and carbohydrate induced hyperlipemia 10/31/2011  . Hypertension 05/21/2011  . Depression 05/21/2011  . Hearing loss 05/21/2011    Past Surgical History:  Procedure Laterality Date  . COLONOSCOPY WITH PROPOFOL N/A 04/10/2015   Procedure: COLONOSCOPY WITH PROPOFOL;  Surgeon: Manya Silvas, MD;  Location: [facility] ENDOSCOPY;  Service: Endoscopy;  Laterality: N/A;  . ESOPHAGOGASTRODUODENOSCOPY (EGD) WITH  PROPOFOL N/A 04/10/2015   Procedure: ESOPHAGOGASTRODUODENOSCOPY (EGD) WITH PROPOFOL;  Surgeon: Manya Silvas, MD;  Location: [facility] ENDOSCOPY;  Service: Endoscopy;  Laterality: N/A;  . fatty tumor removed on left shoulder    . gallbladdedr removed    . REPLACEMENT TOTAL KNEE BILATERAL Bilateral     Prior to Admission medications   Medication Sig Start Date End Date Taking? Authorizing Provider  amLODipine (NORVASC) 5 MG tablet Take 5 mg by mouth daily.  03/27/15   [provider]  aspirin 81 MG tablet Take 81 mg by mouth every other day.      [provider]  Cholecalciferol (VITAMIN D3) 1000 UNITS CAPS Take by mouth. Reported on 10/13/2015    [provider]  citalopram (CELEXA) 10 MG tablet Take 1 tablet (10 mg total) by mouth daily. 05/21/11 11/11/16  Jackolyn Confer, MD  cyanocobalamin (,VITAMIN B-12,) 1000 MCG/ML injection Inject into the muscle. 12/27/13   [provider]  docusate sodium (COLACE) 100 MG capsule Take by mouth. 06/03/14   [provider]  esomeprazole (NEXIUM) 40 MG capsule Take 40 mg by mouth daily before breakfast.      [provider]  ferrous sulfate 325 (65 FE) MG tablet Take 325 mg by mouth 3 (three) times daily with meals. Reported on 10/13/2015    [provider]  labetalol (NORMODYNE) 100 MG tablet Take 0.5 tablets (50 mg total) by mouth at bedtime. 07/22/12   Jackolyn Confer, MD  loratadine (CLARITIN) 10 MG tablet Take 1 tablet (10 mg total) by mouth daily. 02/12/13   Jackolyn Confer, MD  losartan (COZAAR) 50  MG tablet Take 25 mg by mouth daily.  05/01/15   [provider]  mometasone (NASONEX) 50 MCG/ACT nasal spray Place 2 sprays into the nose daily.      [provider]  olopatadine (PATANOL) 0.1 % ophthalmic solution Place 1 drop into both eyes 2 (two) times daily. At 6-8 hours intervals     [provider]  traMADol-acetaminophen (ULTRACET) 37.5-325 MG tablet Take by  mouth. 05/01/15   [provider]  vitamin B-12 (CYANOCOBALAMIN) 500 MCG tablet Take 500 mcg by mouth daily.    [provider]    Allergies Penicillins; Celebrex [celecoxib]; Felodipine; Oxaprozin; Penicillins cross reactors; Rofecoxib; and Atorvastatin  Family History  Problem Relation Age of Onset  . Arthritis Mother   . Hypertension Mother   . Hypertension Father     Social History Social History  Substance Use Topics  . Smoking status: Never Smoker  . Smokeless tobacco: Never Used  . Alcohol use No    Review of Systems Constitutional: No fever/chills Eyes: No visual changes. ENT: No sore throat. Cardiovascular: Denies chest pain. Respiratory: Denies shortness of breath. Gastrointestinal: Positive for right sided abdominal pain. Genitourinary: Negative for dysuria. Musculoskeletal: Negative for back pain. Skin: Negative for rash. Neurological: Negative for headaches, focal weakness or numbness.  ____________________________________________   PHYSICAL EXAM:  VITAL SIGNS: ED Triage Vitals  Enc Vitals Group     BP 01/19/17 2054 (!) 115/56     Pulse Rate 01/19/17 2054 76     Resp 01/19/17 2054 18     Temp 01/19/17 2054 99 F (37.2 C)     Temp Source 01/19/17 2054 Oral     SpO2 01/19/17 2054 92 %     Weight 01/19/17 2055 260 lb (117.9 kg)     Height 01/19/17 2055 6' (1.829 m)   Constitutional: Alert and oriented. Well appearing and in no distress. Eyes: Conjunctivae are normal.  ENT   Head: Normocephalic and atraumatic.   Nose: No congestion/rhinnorhea.   Mouth/Throat: Mucous membranes are moist.   Neck: No stridor. Hematological/Lymphatic/Immunilogical: No cervical lymphadenopathy. Cardiovascular: Normal rate, regular rhythm.  No murmurs, rubs, or gallops.  Respiratory: Normal respiratory effort without tachypnea nor retractions. Breath sounds are clear and equal bilaterally. No wheezes/rales/rhonchi. Gastrointestinal: Soft  and slightly tender on the right side. No rebound. No guarding.  Genitourinary: Deferred Musculoskeletal: Normal range of motion in all extremities. No lower extremity edema. Neurologic:  Normal speech and language. No gross focal neurologic deficits are appreciated.  Skin:  Skin is warm, dry and intact. No rash noted. Psychiatric: Mood and affect are normal. Speech and behavior are normal. Patient exhibits appropriate insight and judgment.  ____________________________________________    LABS (pertinent positives/negatives)  Labs Reviewed  COMPREHENSIVE METABOLIC PANEL - Abnormal; Notable for the following:       Result Value   Glucose, Bld 108 (*)    Creatinine, Ser 1.17 (*)    ALT 10 (*)    GFR calc non Af Amer 42 (*)    GFR calc Af Amer 49 (*)    All other components within normal limits  CBC - Abnormal; Notable for the following:    RDW 16.1 (*)    All other components within normal limits  LIPASE, BLOOD  URINALYSIS, COMPLETE (UACMP) WITH MICROSCOPIC     ____________________________________________   EKG  None  ____________________________________________    RADIOLOGY  CT abd/pel IMPRESSION:  1. Diffuse colonic diverticulosis without acute diverticulitis.  2. Moderate hiatal hernia.  3. Postop change secondary to prior cholecystectomy. No  choledocholithiasis. Small hypodensities within the liver consistent  with cysts and/or hemangiomata.  4. New simple left parapelvic renal cyst measuring 4.4 x 4.9 cm.  5. Fusiform infrarenal abdominal aortic aneurysm measuring up to 4  cm with 2.2 cm right internal iliac artery aneurysm. Recommend  followup by [place] in 1 year. This recommendation follows ACR consensus  guidelines: White Paper of the ACR Incidental Findings Committee II  on Vascular Findings. J Am Coll Radiol 2013; 10:789-794.     ____________________________________________   PROCEDURES  Procedures  ____________________________________________   INITIAL IMPRESSION / ASSESSMENT AND PLAN / ED COURSE  Pertinent labs & imaging results that were available during my care of the patient were reviewed by me and considered in my medical decision making (see chart for details).  Patient presented to the emergency department today because of concerns for right-sided abdominal pain. CT scan does not show any obvious etiology of the patient's pain. It did show an aortic aneurysm. Do not think this is a ruptured. I did discuss over the importance of following up with her primary care doctor. At time of sign out awaiting urinalysis.  ____________________________________________   FINAL CLINICAL IMPRESSION(S) / ED DIAGNOSES  Final diagnoses:  Abdominal pain, unspecified abdominal location  Abdominal aortic aneurysm (AAA) without rupture [facility])     Note: This dictation was prepared with Dragon dictation. Any transcriptional errors that result from this process are unintentional     Nance Pear, MD 01/19/17 2316

## 2017-01-19 NOTE — Discharge Instructions (Addendum)
Please discuss with your doctor about getting an ultrasound in 1 year to evaluate your abdominal aortic aneurysm. Please seek medical attention for any high fevers, chest pain, shortness of breath, change in behavior, persistent vomiting, bloody stool or any other new or concerning symptoms.

## 2017-01-20 DIAGNOSIS — I714 Abdominal aortic aneurysm, without rupture: Secondary | ICD-10-CM | POA: Diagnosis not present

## 2017-01-20 MED ORDER — ONDANSETRON 4 MG PO TBDP
ORAL_TABLET | ORAL | Status: AC
  Filled 2017-01-20: qty 1

## 2017-01-20 MED ORDER — ONDANSETRON 4 MG PO TBDP
4.0000 mg | ORAL_TABLET | Freq: Once | ORAL | Status: AC
  Administered 2017-01-20: 4 mg via ORAL

## 2017-01-20 MED ORDER — OXYCODONE-ACETAMINOPHEN 5-325 MG PO TABS
ORAL_TABLET | ORAL | Status: AC
  Filled 2017-01-20: qty 1

## 2017-01-20 MED ORDER — OXYCODONE-ACETAMINOPHEN 5-325 MG PO TABS
1.0000 | ORAL_TABLET | Freq: Once | ORAL | Status: AC
  Administered 2017-01-20: 1 via ORAL

## 2017-01-20 NOTE — ED Provider Notes (Signed)
I assumed care of the patient from Dr. Archie Balboa at 11:00 PM. Urinalysis revealed no evidence of urinary tract infection. On my arrival to the room patient advised me that her pain is right lower quadrant worse with movement. Patient in apparent discomfort. Patient refused any analgesia earlier but is agreeable to receiving analgesia at this time and a such patient will be given a Percocet and Zofran.Marland Kitchen Spoke with the patient's family at length and reiterated with Dr. Archie Balboa had already informed him regarding the CT scan and need for follow-up.   Gregor Hams, MD 01/20/17 [phone]

## 2017-02-11 ENCOUNTER — Inpatient Hospital Stay: Payer: Medicare Other | Attending: Hematology and Oncology

## 2017-02-12 ENCOUNTER — Ambulatory Visit: Payer: Medicare Other | Attending: Internal Medicine

## 2017-03-04 ENCOUNTER — Ambulatory Visit: Payer: Medicare Other | Attending: Internal Medicine

## 2017-03-04 DIAGNOSIS — G4761 Periodic limb movement disorder: Secondary | ICD-10-CM | POA: Insufficient documentation

## 2017-03-04 DIAGNOSIS — R0683 Snoring: Secondary | ICD-10-CM | POA: Insufficient documentation

## 2017-03-04 DIAGNOSIS — G4733 Obstructive sleep apnea (adult) (pediatric): Secondary | ICD-10-CM | POA: Insufficient documentation

## 2017-04-28 DIAGNOSIS — N183 Chronic kidney disease, stage 3 (moderate): Secondary | ICD-10-CM | POA: Diagnosis not present

## 2017-04-28 DIAGNOSIS — N2581 Secondary hyperparathyroidism of renal origin: Secondary | ICD-10-CM | POA: Diagnosis not present

## 2017-04-28 DIAGNOSIS — D631 Anemia in chronic kidney disease: Secondary | ICD-10-CM | POA: Diagnosis not present

## 2017-04-28 DIAGNOSIS — I10 Essential (primary) hypertension: Secondary | ICD-10-CM | POA: Diagnosis not present

## 2017-04-30 ENCOUNTER — Other Ambulatory Visit: Payer: Self-pay | Admitting: Internal Medicine

## 2017-04-30 DIAGNOSIS — Z1231 Encounter for screening mammogram for malignant neoplasm of breast: Secondary | ICD-10-CM

## 2017-05-12 ENCOUNTER — Inpatient Hospital Stay: Payer: Medicare HMO | Attending: Hematology and Oncology

## 2017-05-12 DIAGNOSIS — Z7982 Long term (current) use of aspirin: Secondary | ICD-10-CM | POA: Insufficient documentation

## 2017-05-12 DIAGNOSIS — D509 Iron deficiency anemia, unspecified: Secondary | ICD-10-CM | POA: Insufficient documentation

## 2017-05-12 DIAGNOSIS — K449 Diaphragmatic hernia without obstruction or gangrene: Secondary | ICD-10-CM | POA: Insufficient documentation

## 2017-05-12 DIAGNOSIS — E538 Deficiency of other specified B group vitamins: Secondary | ICD-10-CM | POA: Insufficient documentation

## 2017-05-12 DIAGNOSIS — I129 Hypertensive chronic kidney disease with stage 1 through stage 4 chronic kidney disease, or unspecified chronic kidney disease: Secondary | ICD-10-CM | POA: Insufficient documentation

## 2017-05-12 DIAGNOSIS — I714 Abdominal aortic aneurysm, without rupture: Secondary | ICD-10-CM | POA: Insufficient documentation

## 2017-05-12 DIAGNOSIS — K648 Other hemorrhoids: Secondary | ICD-10-CM | POA: Insufficient documentation

## 2017-05-12 DIAGNOSIS — N281 Cyst of kidney, acquired: Secondary | ICD-10-CM | POA: Insufficient documentation

## 2017-05-12 DIAGNOSIS — Z96653 Presence of artificial knee joint, bilateral: Secondary | ICD-10-CM | POA: Insufficient documentation

## 2017-05-12 DIAGNOSIS — K5909 Other constipation: Secondary | ICD-10-CM | POA: Insufficient documentation

## 2017-05-12 DIAGNOSIS — K2970 Gastritis, unspecified, without bleeding: Secondary | ICD-10-CM | POA: Insufficient documentation

## 2017-05-12 DIAGNOSIS — Z79899 Other long term (current) drug therapy: Secondary | ICD-10-CM | POA: Insufficient documentation

## 2017-05-12 DIAGNOSIS — N183 Chronic kidney disease, stage 3 (moderate): Secondary | ICD-10-CM | POA: Insufficient documentation

## 2017-05-13 ENCOUNTER — Encounter: Payer: Self-pay | Admitting: Hematology and Oncology

## 2017-05-13 ENCOUNTER — Ambulatory Visit: Payer: Medicare Other

## 2017-05-13 ENCOUNTER — Inpatient Hospital Stay: Payer: Medicare HMO

## 2017-05-13 ENCOUNTER — Inpatient Hospital Stay (HOSPITAL_BASED_OUTPATIENT_CLINIC_OR_DEPARTMENT_OTHER): Payer: Medicare HMO | Admitting: Hematology and Oncology

## 2017-05-13 ENCOUNTER — Ambulatory Visit: Payer: Medicare Other | Admitting: Hematology and Oncology

## 2017-05-13 VITALS — BP 145/76 | HR 70 | Temp 98.9°F | Ht 72.0 in | Wt 266.4 lb

## 2017-05-13 DIAGNOSIS — E853 Secondary systemic amyloidosis: Secondary | ICD-10-CM

## 2017-05-13 DIAGNOSIS — E538 Deficiency of other specified B group vitamins: Secondary | ICD-10-CM | POA: Diagnosis not present

## 2017-05-13 DIAGNOSIS — N183 Chronic kidney disease, stage 3 (moderate): Secondary | ICD-10-CM | POA: Diagnosis not present

## 2017-05-13 DIAGNOSIS — I714 Abdominal aortic aneurysm, without rupture: Secondary | ICD-10-CM | POA: Diagnosis not present

## 2017-05-13 DIAGNOSIS — K648 Other hemorrhoids: Secondary | ICD-10-CM | POA: Diagnosis not present

## 2017-05-13 DIAGNOSIS — Z7982 Long term (current) use of aspirin: Secondary | ICD-10-CM | POA: Diagnosis not present

## 2017-05-13 DIAGNOSIS — D509 Iron deficiency anemia, unspecified: Secondary | ICD-10-CM | POA: Diagnosis not present

## 2017-05-13 DIAGNOSIS — I129 Hypertensive chronic kidney disease with stage 1 through stage 4 chronic kidney disease, or unspecified chronic kidney disease: Secondary | ICD-10-CM

## 2017-05-13 DIAGNOSIS — K449 Diaphragmatic hernia without obstruction or gangrene: Secondary | ICD-10-CM | POA: Diagnosis not present

## 2017-05-13 DIAGNOSIS — L814 Other melanin hyperpigmentation: Secondary | ICD-10-CM

## 2017-05-13 DIAGNOSIS — K5909 Other constipation: Secondary | ICD-10-CM | POA: Diagnosis not present

## 2017-05-13 DIAGNOSIS — Z79899 Other long term (current) drug therapy: Secondary | ICD-10-CM

## 2017-05-13 DIAGNOSIS — Z96653 Presence of artificial knee joint, bilateral: Secondary | ICD-10-CM | POA: Diagnosis not present

## 2017-05-13 DIAGNOSIS — N281 Cyst of kidney, acquired: Secondary | ICD-10-CM | POA: Diagnosis not present

## 2017-05-13 DIAGNOSIS — K2970 Gastritis, unspecified, without bleeding: Secondary | ICD-10-CM | POA: Diagnosis not present

## 2017-05-13 LAB — CBC WITH DIFFERENTIAL/PLATELET
Basophils Absolute: 0.1 10*3/uL (ref 0–0.1)
Basophils Relative: 1 %
Eosinophils Absolute: 0.1 10*3/uL (ref 0–0.7)
Eosinophils Relative: 2 %
HCT: 29.8 % — ABNORMAL LOW (ref 35.0–47.0)
Hemoglobin: 9.8 g/dL — ABNORMAL LOW (ref 12.0–16.0)
Lymphocytes Relative: 26 %
Lymphs Abs: 1.3 10*3/uL (ref 1.0–3.6)
MCH: 26 pg (ref 26.0–34.0)
MCHC: 33 g/dL (ref 32.0–36.0)
MCV: 78.8 fL — ABNORMAL LOW (ref 80.0–100.0)
Monocytes Absolute: 0.5 10*3/uL (ref 0.2–0.9)
Monocytes Relative: 10 %
Neutro Abs: 3.1 10*3/uL (ref 1.4–6.5)
Neutrophils Relative %: 61 %
Platelets: 247 10*3/uL (ref 150–440)
RBC: 3.78 MIL/uL — ABNORMAL LOW (ref 3.80–5.20)
RDW: 15 % — ABNORMAL HIGH (ref 11.5–14.5)
WBC: 5.1 10*3/uL (ref 3.6–11.0)

## 2017-05-13 LAB — FOLATE: Folate: 15.7 ng/mL (ref >5.9)

## 2017-05-13 LAB — FERRITIN: Ferritin: 9 ng/mL — ABNORMAL LOW (ref 11–307)

## 2017-05-13 LAB — VITAMIN B12: Vitamin B-12: 778 pg/mL (ref 180–914)

## 2017-05-13 NOTE — Progress Notes (Signed)
Patient here for follow up she states she has been feeling "more tired and has less energy".

## 2017-05-13 NOTE — Progress Notes (Signed)
[facility] day:  05/13/17  Chief Complaint: Nicole Bishop is a 83 y.o. female with stage III chronic kidney disease and iron deficiency who is seen for 6 month assessment.  HPI: The patient was last seen in the medical oncology clinic on 11/11/2016.  At that time, she felt good.  Exam was stable.  Hematocrit was 36.0.  Ferritin was 19.  She received Venofer weekly x 3 (03/26 - 11/25/2016).  We discussed follow-up with GI secondary to recurrent iron deficiency.    She was seen in the [facility] ER on 01/19/2017 with right sided abdominal pain.  Abdomen and pelvic CT scan revealed diffuse colonic diverticulosis without acute diverticulitis.    She had a moderate hiatal hernia.  There were small hypodensities within the liver c/w cysts or hemangiomas.  There was a new 4.4 x 4.9 cm simple left parapelvic renal cyst.  There was a fusiform infrarenal 4 cm AAA and a 2.2 right internal iliac artery aneurysm.  Follow-up was recommended in 1 year.  CBC on 01/19/2017 revealed a hematocrit of 36.8, hemoglobin 12.1, and MCV 81.9.  Symptomatically, she has been weak and fatigued. She has not followed up with GI as directed last visit citing they never were called with an appointment. Patient has not experienced any bleeding; no hematochezia, melena, epistaxis, or vaginal bleeding. She has chronic constipation.  Patient is eating well; no weight loss. Patient eating iron rich diet. Patient with swelling in her feet; R>L. Patient with dark brown lesion to RIGHT distal tibia/fibula.   Past Medical History:  Diagnosis Date  . Anemia   . Chronic kidney disease   . Hypertension   . IDA (iron deficiency anemia) 12/14/2014  . Renal insufficiency     Past Surgical History:  Procedure Laterality Date  . COLONOSCOPY WITH PROPOFOL N/A 04/10/2015   Procedure: COLONOSCOPY WITH PROPOFOL;  Surgeon: Manya Silvas, MD;  Location: [facility] ENDOSCOPY;  Service: Endoscopy;  Laterality:  N/A;  . ESOPHAGOGASTRODUODENOSCOPY (EGD) WITH PROPOFOL N/A 04/10/2015   Procedure: ESOPHAGOGASTRODUODENOSCOPY (EGD) WITH PROPOFOL;  Surgeon: Manya Silvas, MD;  Location: [facility] ENDOSCOPY;  Service: Endoscopy;  Laterality: N/A;  . fatty tumor removed on left shoulder    . gallbladdedr removed    . REPLACEMENT TOTAL KNEE BILATERAL Bilateral     Family History  Problem Relation Age of Onset  . Arthritis Mother   . Hypertension Mother   . Hypertension Father     Social History:  reports that she has never smoked. She has never used smokeless tobacco. She reports that she does not drink alcohol or use drugs. The patient lives in [place].  She is accompanied by her daughter, [name]: phone [phone] and cell [phone].  Allergies:  Allergies  Allergen Reactions  . Penicillins Rash  . Celebrex [Celecoxib] Other (See Comments)    constipation  . Felodipine Nausea Only  . Oxaprozin Nausea Only  . Penicillins Cross Reactors   . Rofecoxib Other (See Comments)    GI upset  . Atorvastatin Rash    Current Medications: Current Outpatient Prescriptions  Medication Sig Dispense Refill  . amLODipine (NORVASC) 5 MG tablet Take 5 mg by mouth daily.     Marland Kitchen aspirin 81 MG tablet Take 81 mg by mouth every other day.      . Cholecalciferol (VITAMIN D3) 1000 UNITS CAPS Take by mouth. Reported on 10/13/2015    . citalopram (CELEXA) 10 MG tablet Take 1 tablet (10  mg total) by mouth daily. 30 tablet 2  . cyanocobalamin (,VITAMIN B-12,) 1000 MCG/ML injection Inject into the muscle.    . docusate sodium (COLACE) 100 MG capsule Take by mouth.    . esomeprazole (NEXIUM) 40 MG capsule Take 40 mg by mouth daily before breakfast.      . ferrous sulfate 325 (65 FE) MG tablet Take 325 mg by mouth 3 (three) times daily with meals. Reported on 10/13/2015    . labetalol (NORMODYNE) 100 MG tablet Take 0.5 tablets (50 mg total) by mouth at bedtime. 15 tablet 0  . loratadine (CLARITIN) 10 MG tablet Take 1  tablet (10 mg total) by mouth daily. 30 tablet 0  . losartan (COZAAR) 50 MG tablet Take 25 mg by mouth daily.     . mometasone (NASONEX) 50 MCG/ACT nasal spray Place 2 sprays into the nose daily.      Marland Kitchen olopatadine (PATANOL) 0.1 % ophthalmic solution Place 1 drop into both eyes 2 (two) times daily. At 6-8 hours intervals     . traMADol-acetaminophen (ULTRACET) 37.5-325 MG tablet Take by mouth.    . vitamin B-12 (CYANOCOBALAMIN) 500 MCG tablet Take 500 mcg by mouth daily.     No current facility-administered medications for this visit.     Review of Systems:  GENERAL:  Feels "pretty good".  No fevers or sweats.  Weight stable. PERFORMANCE STATUS (ECOG):  1 HEENT:  No visual changes, runny nose, sore throat, mouth sores or tenderness. Lungs: No shortness of breath or cough.  No hemoptysis. Cardiac:  No chest pain, palpitations, orthopnea, or PND.   GI:  Constipation.  Eating well.  No nausea, vomiting, diarrhea, melena or hematochezia. GU:  No urgency, frequency, dysuria, or hematuria. Musculoskeletal:  No back pain.  No joint pain.  No muscle tenderness. Extremities: Swelling in feet. Skin:  Lesion to RIGHT lower leg. No rashes or other skin changes. Neuro:  No headache, numbness or weakness, balance or coordination issues. Endocrine:  No diabetes, thyroid issues, hot flashes or night sweats. Psych:  No mood changes, depression or anxiety. Pain:  No focal pain. Review of systems:  All other systems reviewed and found to be negative.   Physical Exam: Blood pressure (!) 145/76, pulse 70, temperature 98.9 F (37.2 C), temperature source Tympanic, height 6' (1.829 m), weight 266 lb 6.4 oz (120.8 kg). GENERAL:  Well developed, well nourished, elderly woman sitting comfortably in a wheelchair in the exam room in no acute distress. MENTAL STATUS:  Alert and oriented to person, place and time. HEAD:  Shoulder length gray curly hair.  Normocephalic, atraumatic, face symmetric, no Cushingoid  features. EYES:  Brown eyes.  Bilateral arcus.  Pupils equal round and reactive to light and accomodation.  No conjunctivitis or scleral icterus. ENT:  Oropharynx clear without lesion.  Upper dentures.  Tongue normal. Mucous membranes moist.  RESPIRATORY:  Clear to auscultation without rales, wheezes or rhonchi. CARDIOVASCULAR:  Regular rate and rhythm without murmur, rub or gallop. ABDOMEN:  Soft, non-tender, with active bowel sounds, and no hepatosplenomegaly.  No masses. SKIN:  Dark brown varigated lesion with irregular boarders to RIGHT distal tib/fib. No rashes, ulcers or other lesions. EXTREMITIES: Bilateral ankle edema (right > left). No skin discoloration or tenderness.  No palpable cords. LYMPH NODES: No palpable cervical, supraclavicular, axillary or inguinal adenopathy  NEUROLOGICAL: Unremarkable. PSYCH:  Appropriate.    No visits with results within 3 Day(s) from this visit.  Latest known visit with results is:  Admission  on 01/19/2017, Discharged on 01/20/2017  Component Date Value Ref Range Status  . Lipase 01/19/2017 18  11 - 51 U/L Final  . Sodium 01/19/2017 136  135 - 145 mmol/L Final  . Potassium 01/19/2017 3.7  3.5 - 5.1 mmol/L Final  . Chloride 01/19/2017 106  101 - 111 mmol/L Final  . CO2 01/19/2017 22  22 - 32 mmol/L Final  . Glucose, Bld 01/19/2017 108* 65 - 99 mg/dL Final  . BUN 01/19/2017 17  6 - 20 mg/dL Final  . Creatinine, Ser 01/19/2017 1.17* 0.44 - 1.00 mg/dL Final  . Calcium 01/19/2017 9.1  8.9 - 10.3 mg/dL Final  . Total Protein 01/19/2017 7.1  6.5 - 8.1 g/dL Final  . Albumin 01/19/2017 3.7  3.5 - 5.0 g/dL Final  . AST 01/19/2017 16  15 - 41 U/L Final  . ALT 01/19/2017 10* 14 - 54 U/L Final  . Alkaline Phosphatase 01/19/2017 74  38 - 126 U/L Final  . Total Bilirubin 01/19/2017 0.9  0.3 - 1.2 mg/dL Final  . GFR calc non Af Amer 01/19/2017 42* >60 mL/min Final  . GFR calc Af Amer 01/19/2017 49* >60 mL/min Final   Comment: (NOTE) The eGFR has been  calculated using the CKD EPI equation. This calculation has not been validated in all clinical situations. eGFR's persistently <60 mL/min signify possible Chronic Kidney Disease.   . Anion gap 01/19/2017 8  5 - 15 Final  . WBC 01/19/2017 6.9  3.6 - 11.0 K/uL Final  . RBC 01/19/2017 4.49  3.80 - 5.20 MIL/uL Final  . Hemoglobin 01/19/2017 12.1  12.0 - 16.0 g/dL Final  . HCT 01/19/2017 36.8  35.0 - 47.0 % Final  . MCV 01/19/2017 81.9  80.0 - 100.0 fL Final  . MCH 01/19/2017 27.1  26.0 - 34.0 pg Final  . MCHC 01/19/2017 33.0  32.0 - 36.0 g/dL Final  . RDW 01/19/2017 16.1* 11.5 - 14.5 % Final  . Platelets 01/19/2017 240  150 - 440 K/uL Final  . Color, Urine 01/19/2017 STRAW* YELLOW Final  . APPearance 01/19/2017 CLEAR* CLEAR Final  . Specific Gravity, Urine 01/19/2017 1.015  1.005 - 1.030 Final  . pH 01/19/2017 6.0  5.0 - 8.0 Final  . Glucose, UA 01/19/2017 NEGATIVE  NEGATIVE mg/dL Final  . Hgb urine dipstick 01/19/2017 NEGATIVE  NEGATIVE Final  . Bilirubin Urine 01/19/2017 NEGATIVE  NEGATIVE Final  . Ketones, ur 01/19/2017 NEGATIVE  NEGATIVE mg/dL Final  . Protein, ur 01/19/2017 NEGATIVE  NEGATIVE mg/dL Final  . Nitrite 01/19/2017 NEGATIVE  NEGATIVE Final  . Leukocytes, UA 01/19/2017 NEGATIVE  NEGATIVE Final  . RBC / HPF 01/19/2017 0-5  0 - 5 RBC/hpf Final  . WBC, UA 01/19/2017 0-5  0 - 5 WBC/hpf Final  . Bacteria, UA 01/19/2017 NONE SEEN  NONE SEEN Final  . Squamous Epithelial / LPF 01/19/2017 0-5* NONE SEEN Final    Assessment:  CALYSTA CRAIGO is a 83 y.o. African American woman with stage III chronic kidney disease and iron deficiency.  Hematocrit and MCV were normal on 09/03/2013.    She denies any GI bleeding.  Diet is modest.  EGD on 04/10/2015 revealed a large hiatal hernia and gastritis.  Biopsy was unremarkable.  Colonoscopy on 04/10/2015 revealed diverticulosis in the sigmoid, descending, transverse, and ascending colon.  She had internal hemorrhoids.  She is B12 deficient.   She received [facility] at the [facility] (last 07/2016).  She began oral B12 in 08/2016.  She  is intolerant of oral iron secondary to baseline chronic constipation.   Labs on 09/12/2014 revealed a hematocrit of 30 with an MCV of 67. Ferritin was 6 (low) with a TIBC of 487 (high).  Normal studies included direct Coombs, folic acid, B37, haptoglobin, erythropoietin, and SPEP.    She has received weekly Venofer:  800 mg (10/21/2014 - 11/11/2014), 400 mg (03/03/2015 and 03/10/2015), 400 mg (10/20/2015 and 10/27/2015), 200 mg (04/16/2016), and 600 mg (11/11/2016 - 11/25/2016).  She receives Venofer if her ferritin is < 30.    Ferritin has been followed:  14 on 12/28/2014, 9 on 02/23/2015, 113 on 03/17/2015, 12 on 10/13/2015, 31 on 01/10/2016, 19 on 04/11/2016, 23 on 07/18/2016, 9 on 11/11/2016, and 9 on 05/13/2017.  Goal ferritin is 100.  Symptomatically, she feels "weak and tired".  Exam reveals a varigated skin lesion on her distal right lower extremity.  Hematocrit is 29.8.  Ferritin is 9.  Plan: 1.  Labs today:  CBC with diff, ferritin, B12, folate.  2.  Discuss recurrent iron deficiency.  Etiology is unclear.  Discuss follow-up with Dr. Vira Agar (GI). Appointment needs to be rescheduled.  3.  Refer to dermatology for possible melanotic lesion to RIGHT lower leg.  4.  RTC in 3 months labs (CBC with diff, ferritin). 5.  RTC in 6 months for MD assessment, labs (CBC with diff, ferritin- day before) +/- Venofer.  Addendum:  Labs returned after the patient left.  Ferritin low.  She will return for weekly Venofer x 3.   Honor Loh, NP  05/13/2017, 12:32 PM   I saw and evaluated the patient, participating in the key portions of the service and reviewing pertinent diagnostic studies and records.  I reviewed the nurse practitioner's note and agree with the findings and the plan.  The assessment and plan were discussed with the patient.  A few questions were asked by the patient and  answered.   Lequita Asal, MD 05/13/2017, 12:32 PM

## 2017-05-14 ENCOUNTER — Other Ambulatory Visit: Payer: Self-pay | Admitting: *Deleted

## 2017-05-14 DIAGNOSIS — D508 Other iron deficiency anemias: Secondary | ICD-10-CM

## 2017-05-15 DIAGNOSIS — D509 Iron deficiency anemia, unspecified: Secondary | ICD-10-CM | POA: Diagnosis not present

## 2017-05-15 DIAGNOSIS — H28 Cataract in diseases classified elsewhere: Secondary | ICD-10-CM | POA: Diagnosis not present

## 2017-05-15 DIAGNOSIS — G4733 Obstructive sleep apnea (adult) (pediatric): Secondary | ICD-10-CM | POA: Diagnosis not present

## 2017-05-16 ENCOUNTER — Inpatient Hospital Stay: Payer: Medicare HMO

## 2017-05-16 ENCOUNTER — Other Ambulatory Visit: Payer: Self-pay | Admitting: Hematology and Oncology

## 2017-05-16 VITALS — BP 157/83 | HR 63 | Temp 99.0°F | Resp 18

## 2017-05-16 DIAGNOSIS — I714 Abdominal aortic aneurysm, without rupture: Secondary | ICD-10-CM | POA: Diagnosis not present

## 2017-05-16 DIAGNOSIS — I129 Hypertensive chronic kidney disease with stage 1 through stage 4 chronic kidney disease, or unspecified chronic kidney disease: Secondary | ICD-10-CM | POA: Diagnosis not present

## 2017-05-16 DIAGNOSIS — E538 Deficiency of other specified B group vitamins: Secondary | ICD-10-CM | POA: Diagnosis not present

## 2017-05-16 DIAGNOSIS — N281 Cyst of kidney, acquired: Secondary | ICD-10-CM | POA: Diagnosis not present

## 2017-05-16 DIAGNOSIS — N183 Chronic kidney disease, stage 3 (moderate): Secondary | ICD-10-CM | POA: Diagnosis not present

## 2017-05-16 DIAGNOSIS — K648 Other hemorrhoids: Secondary | ICD-10-CM | POA: Diagnosis not present

## 2017-05-16 DIAGNOSIS — D509 Iron deficiency anemia, unspecified: Secondary | ICD-10-CM

## 2017-05-16 DIAGNOSIS — K449 Diaphragmatic hernia without obstruction or gangrene: Secondary | ICD-10-CM | POA: Diagnosis not present

## 2017-05-16 DIAGNOSIS — K2970 Gastritis, unspecified, without bleeding: Secondary | ICD-10-CM | POA: Diagnosis not present

## 2017-05-16 MED ORDER — IRON SUCROSE 20 MG/ML IV SOLN
200.0000 mg | Freq: Once | INTRAVENOUS | Status: AC
  Administered 2017-05-16: 200 mg via INTRAVENOUS
  Filled 2017-05-16: qty 10

## 2017-05-16 MED ORDER — SODIUM CHLORIDE 0.9 % IV SOLN
INTRAVENOUS | Status: DC
  Administered 2017-05-16: 14:00:00 via INTRAVENOUS
  Filled 2017-05-16: qty 1000

## 2017-05-20 ENCOUNTER — Ambulatory Visit
Admission: RE | Admit: 2017-05-20 | Discharge: 2017-05-20 | Disposition: A | Discharge status: Home / Self Care | Payer: Medicare HMO | Source: Ambulatory Visit | Attending: Internal Medicine | Admitting: Internal Medicine

## 2017-05-20 DIAGNOSIS — Z1231 Encounter for screening mammogram for malignant neoplasm of breast: Secondary | ICD-10-CM | POA: Diagnosis not present

## 2017-05-21 DIAGNOSIS — G4733 Obstructive sleep apnea (adult) (pediatric): Secondary | ICD-10-CM | POA: Diagnosis not present

## 2017-05-23 ENCOUNTER — Inpatient Hospital Stay: Payer: Medicare HMO | Attending: Hematology and Oncology

## 2017-05-23 ENCOUNTER — Other Ambulatory Visit: Payer: Self-pay | Admitting: Hematology and Oncology

## 2017-05-23 VITALS — BP 152/84 | HR 64

## 2017-05-23 DIAGNOSIS — Z7982 Long term (current) use of aspirin: Secondary | ICD-10-CM | POA: Insufficient documentation

## 2017-05-23 DIAGNOSIS — E538 Deficiency of other specified B group vitamins: Secondary | ICD-10-CM | POA: Diagnosis not present

## 2017-05-23 DIAGNOSIS — N281 Cyst of kidney, acquired: Secondary | ICD-10-CM | POA: Insufficient documentation

## 2017-05-23 DIAGNOSIS — Z96653 Presence of artificial knee joint, bilateral: Secondary | ICD-10-CM | POA: Diagnosis not present

## 2017-05-23 DIAGNOSIS — K5909 Other constipation: Secondary | ICD-10-CM | POA: Diagnosis not present

## 2017-05-23 DIAGNOSIS — K648 Other hemorrhoids: Secondary | ICD-10-CM | POA: Diagnosis not present

## 2017-05-23 DIAGNOSIS — K2970 Gastritis, unspecified, without bleeding: Secondary | ICD-10-CM | POA: Insufficient documentation

## 2017-05-23 DIAGNOSIS — Z79899 Other long term (current) drug therapy: Secondary | ICD-10-CM | POA: Insufficient documentation

## 2017-05-23 DIAGNOSIS — I129 Hypertensive chronic kidney disease with stage 1 through stage 4 chronic kidney disease, or unspecified chronic kidney disease: Secondary | ICD-10-CM | POA: Diagnosis not present

## 2017-05-23 DIAGNOSIS — N183 Chronic kidney disease, stage 3 (moderate): Secondary | ICD-10-CM | POA: Insufficient documentation

## 2017-05-23 DIAGNOSIS — D509 Iron deficiency anemia, unspecified: Secondary | ICD-10-CM | POA: Diagnosis not present

## 2017-05-23 DIAGNOSIS — K449 Diaphragmatic hernia without obstruction or gangrene: Secondary | ICD-10-CM | POA: Insufficient documentation

## 2017-05-23 DIAGNOSIS — I714 Abdominal aortic aneurysm, without rupture: Secondary | ICD-10-CM | POA: Diagnosis not present

## 2017-05-23 MED ORDER — IRON SUCROSE 20 MG/ML IV SOLN
200.0000 mg | Freq: Once | INTRAVENOUS | Status: AC
  Administered 2017-05-23: 200 mg via INTRAVENOUS
  Filled 2017-05-23: qty 10

## 2017-05-23 MED ORDER — SODIUM CHLORIDE 0.9 % IV SOLN
INTRAVENOUS | Status: DC
  Administered 2017-05-23: 14:00:00 via INTRAVENOUS
  Filled 2017-05-23: qty 1000

## 2017-05-29 DIAGNOSIS — E782 Mixed hyperlipidemia: Secondary | ICD-10-CM | POA: Diagnosis not present

## 2017-05-29 DIAGNOSIS — R42 Dizziness and giddiness: Secondary | ICD-10-CM | POA: Diagnosis not present

## 2017-05-29 DIAGNOSIS — N183 Chronic kidney disease, stage 3 (moderate): Secondary | ICD-10-CM | POA: Diagnosis not present

## 2017-05-29 DIAGNOSIS — R0602 Shortness of breath: Secondary | ICD-10-CM | POA: Diagnosis not present

## 2017-05-29 DIAGNOSIS — I10 Essential (primary) hypertension: Secondary | ICD-10-CM | POA: Diagnosis not present

## 2017-05-30 ENCOUNTER — Inpatient Hospital Stay: Payer: Medicare HMO

## 2017-05-30 VITALS — BP 157/84 | HR 63 | Temp 98.0°F | Resp 18

## 2017-05-30 DIAGNOSIS — D509 Iron deficiency anemia, unspecified: Secondary | ICD-10-CM

## 2017-05-30 DIAGNOSIS — K2970 Gastritis, unspecified, without bleeding: Secondary | ICD-10-CM | POA: Diagnosis not present

## 2017-05-30 DIAGNOSIS — N281 Cyst of kidney, acquired: Secondary | ICD-10-CM | POA: Diagnosis not present

## 2017-05-30 DIAGNOSIS — E538 Deficiency of other specified B group vitamins: Secondary | ICD-10-CM | POA: Diagnosis not present

## 2017-05-30 DIAGNOSIS — N183 Chronic kidney disease, stage 3 (moderate): Secondary | ICD-10-CM | POA: Diagnosis not present

## 2017-05-30 DIAGNOSIS — I129 Hypertensive chronic kidney disease with stage 1 through stage 4 chronic kidney disease, or unspecified chronic kidney disease: Secondary | ICD-10-CM | POA: Diagnosis not present

## 2017-05-30 DIAGNOSIS — K648 Other hemorrhoids: Secondary | ICD-10-CM | POA: Diagnosis not present

## 2017-05-30 DIAGNOSIS — K449 Diaphragmatic hernia without obstruction or gangrene: Secondary | ICD-10-CM | POA: Diagnosis not present

## 2017-05-30 DIAGNOSIS — I714 Abdominal aortic aneurysm, without rupture: Secondary | ICD-10-CM | POA: Diagnosis not present

## 2017-05-30 MED ORDER — IRON SUCROSE 20 MG/ML IV SOLN
200.0000 mg | Freq: Once | INTRAVENOUS | Status: AC
  Administered 2017-05-30: 200 mg via INTRAVENOUS
  Filled 2017-05-30: qty 10

## 2017-06-03 DIAGNOSIS — K219 Gastro-esophageal reflux disease without esophagitis: Secondary | ICD-10-CM | POA: Diagnosis not present

## 2017-06-03 DIAGNOSIS — K5909 Other constipation: Secondary | ICD-10-CM | POA: Diagnosis not present

## 2017-06-03 DIAGNOSIS — E538 Deficiency of other specified B group vitamins: Secondary | ICD-10-CM | POA: Diagnosis not present

## 2017-06-03 DIAGNOSIS — D508 Other iron deficiency anemias: Secondary | ICD-10-CM | POA: Diagnosis not present

## 2017-06-03 DIAGNOSIS — N183 Chronic kidney disease, stage 3 (moderate): Secondary | ICD-10-CM | POA: Diagnosis not present

## 2017-06-06 ENCOUNTER — Other Ambulatory Visit: Payer: Self-pay | Admitting: Hematology and Oncology

## 2017-06-06 ENCOUNTER — Inpatient Hospital Stay: Payer: Medicare HMO

## 2017-06-06 VITALS — BP 154/82 | HR 70 | Temp 98.1°F | Resp 18

## 2017-06-06 DIAGNOSIS — D509 Iron deficiency anemia, unspecified: Secondary | ICD-10-CM | POA: Diagnosis not present

## 2017-06-06 DIAGNOSIS — N183 Chronic kidney disease, stage 3 (moderate): Secondary | ICD-10-CM | POA: Diagnosis not present

## 2017-06-06 DIAGNOSIS — K449 Diaphragmatic hernia without obstruction or gangrene: Secondary | ICD-10-CM | POA: Diagnosis not present

## 2017-06-06 DIAGNOSIS — I129 Hypertensive chronic kidney disease with stage 1 through stage 4 chronic kidney disease, or unspecified chronic kidney disease: Secondary | ICD-10-CM | POA: Diagnosis not present

## 2017-06-06 DIAGNOSIS — K648 Other hemorrhoids: Secondary | ICD-10-CM | POA: Diagnosis not present

## 2017-06-06 DIAGNOSIS — N281 Cyst of kidney, acquired: Secondary | ICD-10-CM | POA: Diagnosis not present

## 2017-06-06 DIAGNOSIS — E538 Deficiency of other specified B group vitamins: Secondary | ICD-10-CM | POA: Diagnosis not present

## 2017-06-06 DIAGNOSIS — K2970 Gastritis, unspecified, without bleeding: Secondary | ICD-10-CM | POA: Diagnosis not present

## 2017-06-06 DIAGNOSIS — D508 Other iron deficiency anemias: Secondary | ICD-10-CM | POA: Diagnosis not present

## 2017-06-06 DIAGNOSIS — I714 Abdominal aortic aneurysm, without rupture: Secondary | ICD-10-CM | POA: Diagnosis not present

## 2017-06-06 MED ORDER — IRON SUCROSE 20 MG/ML IV SOLN
200.0000 mg | Freq: Once | INTRAVENOUS | Status: AC
  Administered 2017-06-06: 200 mg via INTRAVENOUS
  Filled 2017-06-06: qty 10

## 2017-06-10 DIAGNOSIS — H2513 Age-related nuclear cataract, bilateral: Secondary | ICD-10-CM | POA: Diagnosis not present

## 2017-07-02 DIAGNOSIS — H2512 Age-related nuclear cataract, left eye: Secondary | ICD-10-CM | POA: Diagnosis not present

## 2017-07-15 ENCOUNTER — Ambulatory Visit: Payer: Medicare HMO | Admitting: Anesthesiology

## 2017-07-15 ENCOUNTER — Encounter: Payer: Self-pay | Admitting: *Deleted

## 2017-07-15 ENCOUNTER — Ambulatory Visit
Admission: RE | Admit: 2017-07-15 | Discharge: 2017-07-15 | Disposition: A | Discharge status: Home / Self Care | Payer: Medicare HMO | Source: Ambulatory Visit | Attending: Ophthalmology | Admitting: Ophthalmology

## 2017-07-15 ENCOUNTER — Encounter
Admission: RE | Disposition: A | Discharge status: Home / Self Care | Payer: Self-pay | Source: Ambulatory Visit | Attending: Ophthalmology

## 2017-07-15 DIAGNOSIS — I252 Old myocardial infarction: Secondary | ICD-10-CM | POA: Insufficient documentation

## 2017-07-15 DIAGNOSIS — I10 Essential (primary) hypertension: Secondary | ICD-10-CM | POA: Insufficient documentation

## 2017-07-15 DIAGNOSIS — I2510 Atherosclerotic heart disease of native coronary artery without angina pectoris: Secondary | ICD-10-CM | POA: Insufficient documentation

## 2017-07-15 DIAGNOSIS — K219 Gastro-esophageal reflux disease without esophagitis: Secondary | ICD-10-CM | POA: Diagnosis not present

## 2017-07-15 DIAGNOSIS — Z79899 Other long term (current) drug therapy: Secondary | ICD-10-CM | POA: Insufficient documentation

## 2017-07-15 DIAGNOSIS — Z96659 Presence of unspecified artificial knee joint: Secondary | ICD-10-CM | POA: Diagnosis not present

## 2017-07-15 DIAGNOSIS — H2512 Age-related nuclear cataract, left eye: Secondary | ICD-10-CM | POA: Insufficient documentation

## 2017-07-15 DIAGNOSIS — F329 Major depressive disorder, single episode, unspecified: Secondary | ICD-10-CM | POA: Diagnosis not present

## 2017-07-15 DIAGNOSIS — Z791 Long term (current) use of non-steroidal anti-inflammatories (NSAID): Secondary | ICD-10-CM | POA: Insufficient documentation

## 2017-07-15 DIAGNOSIS — I129 Hypertensive chronic kidney disease with stage 1 through stage 4 chronic kidney disease, or unspecified chronic kidney disease: Secondary | ICD-10-CM | POA: Diagnosis not present

## 2017-07-15 DIAGNOSIS — N189 Chronic kidney disease, unspecified: Secondary | ICD-10-CM | POA: Diagnosis not present

## 2017-07-15 HISTORY — DX: Cardiac arrhythmia, unspecified: I49.9

## 2017-07-15 HISTORY — DX: Anxiety disorder, unspecified: F41.9

## 2017-07-15 HISTORY — PX: CATARACT EXTRACTION W/PHACO: SHX586

## 2017-07-15 HISTORY — DX: Gastro-esophageal reflux disease without esophagitis: K21.9

## 2017-07-15 HISTORY — DX: Unspecified osteoarthritis, unspecified site: M19.90

## 2017-07-15 HISTORY — DX: Acute myocardial infarction, unspecified: I21.9

## 2017-07-15 SURGERY — PHACOEMULSIFICATION, CATARACT, WITH IOL INSERTION
Anesthesia: Monitor Anesthesia Care | Site: Eye | Laterality: Left | Wound class: Clean

## 2017-07-15 MED ORDER — EPINEPHRINE PF 1 MG/ML IJ SOLN
INTRAOCULAR | Status: DC | PRN
  Administered 2017-07-15: 09:00:00 via OPHTHALMIC

## 2017-07-15 MED ORDER — SODIUM CHLORIDE 0.9 % IV SOLN
INTRAVENOUS | Status: DC
  Administered 2017-07-15: 50 mL/h via INTRAVENOUS

## 2017-07-15 MED ORDER — ARMC OPHTHALMIC DILATING DROPS
1.0000 "application " | OPHTHALMIC | Status: AC
  Administered 2017-07-15 (×3): 1 via OPHTHALMIC

## 2017-07-15 MED ORDER — MIDAZOLAM HCL 2 MG/2ML IJ SOLN
INTRAMUSCULAR | Status: DC | PRN
  Administered 2017-07-15 (×2): 1 mg via INTRAVENOUS

## 2017-07-15 MED ORDER — MOXIFLOXACIN HCL 0.5 % OP SOLN
OPHTHALMIC | Status: DC | PRN
  Administered 2017-07-15: 0.2 mL via OPHTHALMIC

## 2017-07-15 MED ORDER — LIDOCAINE HCL (PF) 4 % IJ SOLN
INTRAMUSCULAR | Status: DC | PRN
  Administered 2017-07-15: 4 mL via OPHTHALMIC

## 2017-07-15 MED ORDER — POVIDONE-IODINE 5 % OP SOLN
OPHTHALMIC | Status: DC | PRN
  Administered 2017-07-15: 1 via OPHTHALMIC

## 2017-07-15 MED ORDER — ARMC OPHTHALMIC DILATING DROPS
OPHTHALMIC | Status: AC
  Administered 2017-07-15: 1 via OPHTHALMIC
  Filled 2017-07-15: qty 0.4

## 2017-07-15 MED ORDER — MOXIFLOXACIN HCL 0.5 % OP SOLN
OPHTHALMIC | Status: AC
  Filled 2017-07-15: qty 3

## 2017-07-15 MED ORDER — MOXIFLOXACIN HCL 0.5 % OP SOLN: 1.0000 [drp] | Freq: Once | OPHTHALMIC | Status: DC

## 2017-07-15 MED ORDER — MIDAZOLAM HCL 2 MG/2ML IJ SOLN
INTRAMUSCULAR | Status: AC
  Filled 2017-07-15: qty 2

## 2017-07-15 MED ORDER — CARBACHOL 0.01 % IO SOLN
INTRAOCULAR | Status: DC | PRN
  Administered 2017-07-15: 0.5 mL via INTRAOCULAR

## 2017-07-15 MED ORDER — NA CHONDROIT SULF-NA HYALURON 40-17 MG/ML IO SOLN
INTRAOCULAR | Status: DC | PRN
  Administered 2017-07-15: 1 mL via INTRAOCULAR

## 2017-07-15 SURGICAL SUPPLY — 16 items
GLOVE BIO SURGEON STRL SZ8 (GLOVE) ×3 IMPLANT
GLOVE BIOGEL M 6.5 STRL (GLOVE) ×3 IMPLANT
GLOVE SURG LX 8.0 MICRO (GLOVE) ×2
GLOVE SURG LX STRL 8.0 MICRO (GLOVE) ×1 IMPLANT
GOWN STRL REUS W/ TWL LRG LVL3 (GOWN DISPOSABLE) ×2 IMPLANT
GOWN STRL REUS W/TWL LRG LVL3 (GOWN DISPOSABLE) ×4
LABEL CATARACT MEDS ST (LABEL) ×3 IMPLANT
LENS IOL TECNIS ITEC 19.0 (Intraocular Lens) ×3 IMPLANT
PACK CATARACT (MISCELLANEOUS) ×3 IMPLANT
PACK CATARACT BRASINGTON LX (MISCELLANEOUS) ×3 IMPLANT
PACK EYE AFTER SURG (MISCELLANEOUS) ×3 IMPLANT
SOL BSS BAG (MISCELLANEOUS) ×3
SOLUTION BSS BAG (MISCELLANEOUS) ×1 IMPLANT
SYR 5ML LL (SYRINGE) ×3 IMPLANT
WATER STERILE IRR 250ML POUR (IV SOLUTION) ×3 IMPLANT
WIPE NON LINTING 3.25X3.25 (MISCELLANEOUS) ×3 IMPLANT

## 2017-07-15 NOTE — Anesthesia Postprocedure Evaluation (Signed)
Anesthesia Post Note  Patient: Nicole Bishop  Procedure(s) Performed: CATARACT EXTRACTION PHACO AND INTRAOCULAR LENS PLACEMENT (IOC)-LEFT (Left Eye)  Patient location during evaluation: Short Stay Anesthesia Type: MAC Level of consciousness: awake and alert and oriented Pain management: pain level controlled Vital Signs Assessment: post-procedure vital signs reviewed and stable Respiratory status: spontaneous breathing Cardiovascular status: stable Postop Assessment: no headache, no apparent nausea or vomiting and adequate PO intake Anesthetic complications: no     Last Vitals:  Vitals:   07/15/17 0945 07/15/17 0947  BP: 126/68 (!) 144/75  Pulse: 65 64  Resp: 16 18  Temp: 37.1 C   SpO2: 98% 99%    Last Pain:  Vitals:   07/15/17 0833  TempSrc: Oral  PainSc: 0-No pain                 Lanora Manis

## 2017-07-15 NOTE — Transfer of Care (Signed)
Immediate Anesthesia Transfer of Care Note  Patient: Nicole Bishop  Procedure(s) Performed: CATARACT EXTRACTION PHACO AND INTRAOCULAR LENS PLACEMENT (IOC)-LEFT (Left Eye)  Patient Location: Short Stay  Anesthesia Type:MAC  Level of Consciousness: awake, alert  and oriented  Airway & Oxygen Therapy: Patient Spontanous Breathing  Post-op Assessment: Report given to RN and Post -op Vital signs reviewed and stable  Post vital signs: Reviewed and stable  Last Vitals:  Vitals:   07/15/17 0833 07/15/17 0947  BP: (!) 144/76 (!) 144/75  Pulse: 67 64  Resp: 16 18  Temp: 36.8 C   SpO2: 96% 99%    Last Pain:  Vitals:   07/15/17 0833  TempSrc: Oral  PainSc: 0-No pain         Complications: No apparent anesthesia complications

## 2017-07-15 NOTE — Discharge Instructions (Signed)
Eye Surgery Discharge Instructions  Expect mild scratchy sensation or mild soreness. DO NOT RUB YOUR EYE!  The day of surgery:  Minimal physical activity, but bed rest is not required  No reading, computer work, or close hand work  No bending, lifting, or straining.  May watch TV  For 24 hours:  No driving, legal decisions, or alcoholic beverages  Safety precautions  Eat anything you prefer: It is better to start with liquids, then soup then solid foods.  _____ Eye patch should be worn until postoperative exam tomorrow.  ____ Solar shield eyeglasses should be worn for comfort in the sunlight/patch while sleeping  Resume all regular medications including aspirin or Coumadin if these were discontinued prior to surgery. You may shower, bathe, shave, or wash your hair. Tylenol may be taken for mild discomfort.  Call your doctor if you experience significant pain, nausea, or vomiting, fever > 101 or other signs of infection. [phone] or [phone] Specific instructions:  Follow-up Information    Birder Robson, MD Follow up.   Specialty:  Ophthalmology Why:  November 28 at 10:05am Contact information: [address] [place] [address] [phone]

## 2017-07-15 NOTE — H&P (Signed)
All labs reviewed. Abnormal studies sent to patients PCP when indicated.  Previous H&P reviewed, patient examined, there are NO CHANGES.  Nicole Harig LOUIS11/27/20189:20 AM

## 2017-07-15 NOTE — Anesthesia Preprocedure Evaluation (Signed)
Anesthesia Evaluation  Patient identified by MRN, date of birth, ID band Patient awake    Reviewed: Allergy & Precautions, H&P , NPO status , Patient's Chart, lab work & pertinent test results, reviewed documented beta blocker date and time   History of Anesthesia Complications Negative for: history of anesthetic complications  Airway Mallampati: I  TM Distance: >3 FB Neck ROM: full    Dental  (+) Upper Dentures, Lower Dentures, Dental Advidsory Given   Pulmonary neg shortness of breath, asthma , neg sleep apnea, neg COPD, neg recent URI,           Cardiovascular Exercise Tolerance: Good hypertension, (-) angina+ Past MI  (-) CAD, (-) Cardiac Stents and (-) CABG + dysrhythmias (-) Valvular Problems/Murmurs     Neuro/Psych PSYCHIATRIC DISORDERS negative neurological ROS     GI/Hepatic Neg liver ROS, GERD  ,  Endo/Other  negative endocrine ROS  Renal/GU CRFRenal disease  negative genitourinary   Musculoskeletal   Abdominal   Peds  Hematology  (+) Blood dyscrasia, anemia ,   Anesthesia Other Findings Past Medical History: No date: Anemia No date: Anxiety No date: Arthritis No date: Chronic kidney disease No date: Dysrhythmia     Comment:  gets tachy if anxious or excited No date: GERD (gastroesophageal reflux disease) No date: Hypertension 12/14/2014: IDA (iron deficiency anemia) 1992: Myocardial infarction ([place]) No date: Renal insufficiency   Reproductive/Obstetrics negative OB ROS                             Anesthesia Physical Anesthesia Plan  ASA: III  Anesthesia Plan: MAC   Post-op Pain Management:    Induction: Intravenous  PONV Risk Score and Plan:   Airway Management Planned: Nasal Cannula  Additional Equipment:   Intra-op Plan:   Post-operative Plan:   Informed Consent: I have reviewed the patients History and Physical, chart, labs and discussed the procedure  including the risks, benefits and alternatives for the proposed anesthesia with the patient or authorized representative who has indicated his/her understanding and acceptance.   Dental Advisory Given  Plan Discussed with: Anesthesiologist, CRNA and Surgeon  Anesthesia Plan Comments:         Anesthesia Quick Evaluation

## 2017-07-15 NOTE — Anesthesia Post-op Follow-up Note (Signed)
Anesthesia QCDR form completed.        

## 2017-07-15 NOTE — Op Note (Signed)
PREOPERATIVE DIAGNOSIS:  Nuclear sclerotic cataract of the left eye.   POSTOPERATIVE DIAGNOSIS:  Nuclear sclerotic cataract of the left eye.   OPERATIVE PROCEDURE: Procedure(s): CATARACT EXTRACTION PHACO AND INTRAOCULAR LENS PLACEMENT (IOC)-LEFT   SURGEON:  Birder Robson, MD.   ANESTHESIA:  Anesthesiologist: Martha Clan, MD CRNA: Jonna Clark, CRNA  1.      Managed anesthesia care. 2.     0.71ml of Shugarcaine was instilled following the paracentesis   COMPLICATIONS:  None.   TECHNIQUE:   Stop and chop   DESCRIPTION OF PROCEDURE:  The patient was examined and consented in the preoperative holding area where the aforementioned topical anesthesia was applied to the left eye and then brought back to the Operating Room where the left eye was prepped and draped in the usual sterile ophthalmic fashion and a lid speculum was placed. A paracentesis was created with the side port blade and the anterior chamber was filled with viscoelastic. A near clear corneal incision was performed with the steel keratome. A continuous curvilinear capsulorrhexis was performed with a cystotome followed by the capsulorrhexis forceps. Hydrodissection and hydrodelineation were carried out with BSS on a blunt cannula. The lens was removed in a stop and chop  technique and the remaining cortical material was removed with the irrigation-aspiration handpiece. The capsular bag was inflated with viscoelastic and the Technis [id number] lens was placed in the capsular bag without complication. The remaining viscoelastic was removed from the eye with the irrigation-aspiration handpiece. The wounds were hydrated. The anterior chamber was flushed with Miostat and the eye was inflated to physiologic pressure. 0.2ml Vigamox was placed in the anterior chamber. The wounds were found to be water tight. The eye was dressed with Vigamox. The patient was given protective glasses to wear throughout the day and a shield with which to sleep  tonight. The patient was also given drops with which to begin a drop regimen today and will follow-up with me in one day. Implant Name Type Inv. Item Serial No. Manufacturer Lot No. LRB No. Used  LENS IOL DIOP 19.0 - [id number] Intraocular Lens LENS IOL DIOP 19.0 [id number] AMO  Left 1    Procedure(s) with comments: CATARACT EXTRACTION PHACO AND INTRAOCULAR LENS PLACEMENT (IOC)-LEFT (Left) - [place] 00.36.1 AP% 13.9 CDE 5.02 Fluid Pack lot # [id number]  Electronically signed: [place] 07/15/2017 9:44 AM

## 2017-07-16 DIAGNOSIS — R441 Visual hallucinations: Secondary | ICD-10-CM | POA: Insufficient documentation

## 2017-07-16 DIAGNOSIS — F325 Major depressive disorder, single episode, in full remission: Secondary | ICD-10-CM | POA: Diagnosis not present

## 2017-07-16 DIAGNOSIS — E538 Deficiency of other specified B group vitamins: Secondary | ICD-10-CM | POA: Diagnosis not present

## 2017-07-16 DIAGNOSIS — I10 Essential (primary) hypertension: Secondary | ICD-10-CM | POA: Diagnosis not present

## 2017-07-16 DIAGNOSIS — K219 Gastro-esophageal reflux disease without esophagitis: Secondary | ICD-10-CM | POA: Diagnosis not present

## 2017-07-16 DIAGNOSIS — D229 Melanocytic nevi, unspecified: Secondary | ICD-10-CM | POA: Diagnosis not present

## 2017-07-31 ENCOUNTER — Other Ambulatory Visit
Admission: RE | Admit: 2017-07-31 | Discharge: 2017-07-31 | Disposition: A | Discharge status: Home / Self Care | Payer: Medicare HMO | Source: Ambulatory Visit | Attending: Psychiatry | Admitting: Psychiatry

## 2017-07-31 ENCOUNTER — Ambulatory Visit (INDEPENDENT_AMBULATORY_CARE_PROVIDER_SITE_OTHER): Payer: Medicare HMO | Admitting: Psychiatry

## 2017-07-31 ENCOUNTER — Encounter: Payer: Self-pay | Admitting: Psychiatry

## 2017-07-31 ENCOUNTER — Other Ambulatory Visit: Payer: Self-pay

## 2017-07-31 VITALS — BP 101/65 | HR 73 | Temp 98.9°F

## 2017-07-31 DIAGNOSIS — F321 Major depressive disorder, single episode, moderate: Secondary | ICD-10-CM

## 2017-07-31 DIAGNOSIS — F29 Unspecified psychosis not due to a substance or known physiological condition: Secondary | ICD-10-CM

## 2017-07-31 LAB — TSH: TSH: 2.268 u[IU]/mL (ref 0.350–4.500)

## 2017-07-31 MED ORDER — TRAZODONE HCL 50 MG PO TABS: 25.0000 mg | ORAL_TABLET | Freq: Every evening | ORAL | 1 refills | Status: DC | PRN

## 2017-07-31 MED ORDER — ESCITALOPRAM OXALATE 20 MG PO TABS: 20.0000 mg | ORAL_TABLET | Freq: Every day | ORAL | 1 refills | Status: DC

## 2017-07-31 MED ORDER — RISPERIDONE 0.5 MG PO TABS: 0.5000 mg | ORAL_TABLET | Freq: Every day | ORAL | 1 refills | Status: DC

## 2017-07-31 NOTE — Patient Instructions (Signed)
Escitalopram tablets What is this medicine? ESCITALOPRAM (es sye TAL oh pram) is used to treat depression and certain types of anxiety. This medicine may be used for other purposes; ask your health care provider or pharmacist if you have questions. COMMON BRAND NAME(S): Lexapro What should I tell my health care provider before I take this medicine? They need to know if you have any of these conditions: -bipolar disorder or a family history of bipolar disorder -diabetes -glaucoma -heart disease -kidney or liver disease -receiving electroconvulsive therapy -seizures (convulsions) -suicidal thoughts, plans, or attempt by you or a family member -an unusual or allergic reaction to escitalopram, the related drug citalopram, other medicines, foods, dyes, or preservatives -pregnant or trying to become pregnant -breast-feeding How should I use this medicine? Take this medicine by mouth with a glass of water. Follow the directions on the prescription label. You can take it with or without food. If it upsets your stomach, take it with food. Take your medicine at regular intervals. Do not take it more often than directed. Do not stop taking this medicine suddenly except upon the advice of your doctor. Stopping this medicine too quickly may cause serious side effects or your condition may worsen. A special MedGuide will be given to you by the pharmacist with each prescription and refill. Be sure to read this information carefully each time. Talk to your pediatrician regarding the use of this medicine in children. Special care may be needed. Overdosage: If you think you have taken too much of this medicine contact a poison control center or emergency room at once. NOTE: This medicine is only for you. Do not share this medicine with others. What if I miss a dose? If you miss a dose, take it as soon as you can. If it is almost time for your next dose, take only that dose. Do not take double or extra  doses. What may interact with this medicine? Do not take this medicine with any of the following medications: -certain medicines for fungal infections like fluconazole, itraconazole, ketoconazole, posaconazole, voriconazole -cisapride -citalopram -dofetilide -dronedarone -linezolid -MAOIs like Carbex, Eldepryl, Marplan, Nardil, and Parnate -methylene blue (injected into a vein) -pimozide -thioridazine -ziprasidone This medicine may also interact with the following medications: -alcohol -amphetamines -aspirin and aspirin-like medicines -carbamazepine -certain medicines for depression, anxiety, or psychotic disturbances -certain medicines for migraine headache like almotriptan, eletriptan, frovatriptan, naratriptan, rizatriptan, sumatriptan, zolmitriptan -certain medicines for sleep -certain medicines that treat or prevent blood clots like warfarin, enoxaparin, dalteparin -cimetidine -diuretics -fentanyl -furazolidone -isoniazid -lithium -metoprolol -NSAIDs, medicines for pain and inflammation, like ibuprofen or naproxen -other medicines that prolong the QT interval (cause an abnormal heart rhythm) -procarbazine -rasagiline -supplements like St. John's wort, kava kava, valerian -tramadol -tryptophan This list may not describe all possible interactions. Give your health care provider a list of all the medicines, herbs, non-prescription drugs, or dietary supplements you use. Also tell them if you smoke, drink alcohol, or use illegal drugs. Some items may interact with your medicine. What should I watch for while using this medicine? Tell your doctor if your symptoms do not get better or if they get worse. Visit your doctor or health care professional for regular checks on your progress. Because it may take several weeks to see the full effects of this medicine, it is important to continue your treatment as prescribed by your doctor. Patients and their families should watch out for  new or worsening thoughts of suicide or depression. Also watch out for   sudden changes in feelings such as feeling anxious, agitated, panicky, irritable, hostile, aggressive, impulsive, severely restless, overly excited and hyperactive, or not being able to sleep. If this happens, especially at the beginning of treatment or after a change in dose, call your health care professional. Dennis Bast may get drowsy or dizzy. Do not drive, use machinery, or do anything that needs mental alertness until you know how this medicine affects you. Do not stand or sit up quickly, especially if you are an older patient. This reduces the risk of dizzy or fainting spells. Alcohol may interfere with the effect of this medicine. Avoid alcoholic drinks. Your mouth may get dry. Chewing sugarless gum or sucking hard candy, and drinking plenty of water may help. Contact your doctor if the problem does not go away or is severe. What side effects may I notice from receiving this medicine? Side effects that you should report to your doctor or health care professional as soon as possible: -allergic reactions like skin rash, itching or hives, swelling of the face, lips, or tongue -anxious -black, tarry stools -changes in vision -confusion -elevated mood, decreased need for sleep, racing thoughts, impulsive behavior -eye pain -fast, irregular heartbeat -feeling faint or lightheaded, falls -feeling agitated, angry, or irritable -hallucination, loss of contact with reality -loss of balance or coordination -loss of memory -painful or prolonged erections -restlessness, pacing, inability to keep still -seizures -stiff muscles -suicidal thoughts or other mood changes -trouble sleeping -unusual bleeding or bruising -unusually weak or tired -vomiting Side effects that usually do not require medical attention (report to your doctor or health care professional if they continue or are bothersome): -changes in appetite -change in sex  drive or performance -headache -increased sweating -indigestion, nausea -tremors This list may not describe all possible side effects. Call your doctor for medical advice about side effects. You may report side effects to FDA at 1-800-FDA-1088. Where should I keep my medicine? Keep out of reach of children. Store at room temperature between 15 and 30 degrees C (59 and 86 degrees F). Throw away any unused medicine after the expiration date. NOTE: This sheet is a summary. It may not cover all possible information. If you have questions about this medicine, talk to your doctor, pharmacist, or health care provider.  2018 [employer] Standard (2016-01-08 13:20:23) Trazodone tablets What is this medicine? TRAZODONE (TRAZ oh done) is used to treat depression. This medicine may be used for other purposes; ask your health care provider or pharmacist if you have questions. COMMON BRAND NAME(S): Desyrel What should I tell my health care provider before I take this medicine? They need to know if you have any of these conditions: -attempted suicide or thinking about it -bipolar disorder -bleeding problems -glaucoma -heart disease, or previous heart attack -irregular heart beat -kidney or liver disease -low levels of sodium in the blood -an unusual or allergic reaction to trazodone, other medicines, foods, dyes or preservatives -pregnant or trying to get pregnant -breast-feeding How should I use this medicine? Take this medicine by mouth with a glass of water. Follow the directions on the prescription label. Take this medicine shortly after a meal or a light snack. Take your medicine at regular intervals. Do not take your medicine more often than directed. Do not stop taking this medicine suddenly except upon the advice of your doctor. Stopping this medicine too quickly may cause serious side effects or your condition may worsen. A special MedGuide will be given to you by the pharmacist with each  prescription and refill. Be sure to read this information carefully each time. Talk to your pediatrician regarding the use of this medicine in children. Special care may be needed. Overdosage: If you think you have taken too much of this medicine contact a poison control center or emergency room at once. NOTE: This medicine is only for you. Do not share this medicine with others. What if I miss a dose? If you miss a dose, take it as soon as you can. If it is almost time for your next dose, take only that dose. Do not take double or extra doses. What may interact with this medicine? Do not take this medicine with any of the following medications: -certain medicines for fungal infections like fluconazole, itraconazole, ketoconazole, posaconazole, voriconazole -cisapride -dofetilide -dronedarone -linezolid -MAOIs like Carbex, Eldepryl, Marplan, Nardil, and Parnate -mesoridazine -methylene blue (injected into a vein) -pimozide -saquinavir -thioridazine -ziprasidone This medicine may also interact with the following medications: -alcohol -antiviral medicines for HIV or AIDS -aspirin and aspirin-like medicines -barbiturates like phenobarbital -certain medicines for blood pressure, heart disease, irregular heart beat -certain medicines for depression, anxiety, or psychotic disturbances -certain medicines for migraine headache like almotriptan, eletriptan, frovatriptan, naratriptan, rizatriptan, sumatriptan, zolmitriptan -certain medicines for seizures like carbamazepine and phenytoin -certain medicines for sleep -certain medicines that treat or prevent blood clots like dalteparin, enoxaparin, warfarin -digoxin -fentanyl -lithium -NSAIDS, medicines for pain and inflammation, like ibuprofen or naproxen -other medicines that prolong the QT interval (cause an abnormal heart rhythm) -rasagiline -supplements like St. John's wort, kava kava, valerian -tramadol -tryptophan This list may not  describe all possible interactions. Give your health care provider a list of all the medicines, herbs, non-prescription drugs, or dietary supplements you use. Also tell them if you smoke, drink alcohol, or use illegal drugs. Some items may interact with your medicine. What should I watch for while using this medicine? Tell your doctor if your symptoms do not get better or if they get worse. Visit your doctor or health care professional for regular checks on your progress. Because it may take several weeks to see the full effects of this medicine, it is important to continue your treatment as prescribed by your doctor. Patients and their families should watch out for new or worsening thoughts of suicide or depression. Also watch out for sudden changes in feelings such as feeling anxious, agitated, panicky, irritable, hostile, aggressive, impulsive, severely restless, overly excited and hyperactive, or not being able to sleep. If this happens, especially at the beginning of treatment or after a change in dose, call your health care professional. Dennis Bast may get drowsy or dizzy. Do not drive, use machinery, or do anything that needs mental alertness until you know how this medicine affects you. Do not stand or sit up quickly, especially if you are an older patient. This reduces the risk of dizzy or fainting spells. Alcohol may interfere with the effect of this medicine. Avoid alcoholic drinks. This medicine may cause dry eyes and blurred vision. If you wear contact lenses you may feel some discomfort. Lubricating drops may help. See your eye doctor if the problem does not go away or is severe. Your mouth may get dry. Chewing sugarless gum, sucking hard candy and drinking plenty of water may help. Contact your doctor if the problem does not go away or is severe. What side effects may I notice from receiving this medicine? Side effects that you should report to your doctor or health care professional as soon as  possible: -allergic reactions like skin rash, itching or hives, swelling of the face, lips, or tongue -elevated mood, decreased need for sleep, racing thoughts, impulsive behavior -confusion -fast, irregular heartbeat -feeling faint or lightheaded, falls -feeling agitated, angry, or irritable -loss of balance or coordination -painful or prolonged erections -restlessness, pacing, inability to keep still -suicidal thoughts or other mood changes -tremors -trouble sleeping -seizures -unusual bleeding or bruising Side effects that usually do not require medical attention (report to your doctor or health care professional if they continue or are bothersome): -change in sex drive or performance -change in appetite or weight -constipation -headache -muscle aches or pains -nausea This list may not describe all possible side effects. Call your doctor for medical advice about side effects. You may report side effects to FDA at 1-800-FDA-1088. Where should I keep my medicine? Keep out of the reach of children. Store at room temperature between 15 and 30 degrees C (59 to 86 degrees F). Protect from light. Keep container tightly closed. Throw away any unused medicine after the expiration date. NOTE: This sheet is a summary. It may not cover all possible information. If you have questions about this medicine, talk to your doctor, pharmacist, or health care provider.  2018 [employer] Standard (2016-01-04 16:57:05) Risperidone tablets What is this medicine? RISPERIDONE (ris PER i done) is an antipsychotic. It is used to treat schizophrenia, bipolar disorder, and some symptoms of autism. This medicine may be used for other purposes; ask your health care provider or pharmacist if you have questions. COMMON BRAND NAME(S): Risperdal What should I tell my health care provider before I take this medicine? They need to know if you have any of these conditions: -blood disorder or  disease -dementia -diabetes or a family history of diabetes -difficulty swallowing -heart disease or previous heart attack -history of brain tumor or head injury -history of breast cancer -irregular heartbeat or low blood pressure -kidney or liver disease -Parkinson's disease -seizures (convulsions) -an unusual or allergic reaction to risperidone, paliperidone, other medicines, foods, dyes, or preservatives -pregnant or trying to get pregnant -breast-feeding How should I use this medicine? Take this medicine by mouth with a glass of water. Follow the directions on the prescription label. You can take it with or without food. If it upsets your stomach, take it with food. Take your medicine at regular intervals. Do not take it more often than directed. Do not stop taking except on your doctor's advice. Talk to your pediatrician regarding the use of this medicine in children. While this drug may be prescribed for children as young as 38 years of age for selected conditions, precautions do apply. Overdosage: If you think you have taken too much of this medicine contact a poison control center or emergency room at once. NOTE: This medicine is only for you. Do not share this medicine with others. What if I miss a dose? If you miss a dose, take it as soon as you can. If it is almost time for your next dose, take only that dose. Do not take double or extra doses. What may interact with this medicine? Do not take this medicine with any of the following medications: -certain medicines for fungal infections like fluconazole, itraconazole, ketoconazole, posaconazole, voriconazole -cisapride -dofetilide -dronedarone -droperidol -pimozide -sparfloxacin -thioridazine This medicine may also interact with the following medications: -arsenic trioxide -certain antibiotics like clarithromycin, gatifloxacin, levofloxacin, moxifloxacin, pentamidine, rifampin -certain medicines for blood pressure -certain  medicines for cancer -certain medicines for irregular heart beat -certain medications  for Parkinson's disease like levodopa -certain medicines for seizures like carbamazepine -certain medicines for sleep or sedation -narcotic medicines for pain -other medicines for mental anxiety, depression, or psychotic disturbances -other medicines that prolong the QT interval (cause an abnormal heart rhythm) -ritonavir This list may not describe all possible interactions. Give your health care provider a list of all the medicines, herbs, non-prescription drugs, or dietary supplements you use. Also tell them if you smoke, drink alcohol, or use illegal drugs. Some items may interact with your medicine. What should I watch for while using this medicine? Visit your doctor or health care professional for regular checks on your progress. It may be several weeks before you see the full effects. Do not suddenly stop taking this medicine. You may need to gradually reduce the dose. Only stop taking this medicine on the advice of your doctor or health care professional. Dennis Bast may get dizzy or drowsy. Do not drive, use machinery, or do anything that needs mental alertness until you know how this medicine affects you. Do not stand or sit up quickly, especially if you are an older patient. This reduces the risk of dizzy or fainting spells. Alcohol can increase dizziness and drowsiness. Avoid alcoholic drinks. You can get a hangover effect the morning after a bedtime dose. Do not treat yourself for colds, diarrhea or allergies. Ask your doctor or health care professional for advice, some nonprescription medicines may increase possible side effects. This medicine can reduce the response of your body to heat or cold. Dress warm in cold weather and stay hydrated in hot weather. If possible, avoid extreme temperatures like saunas, hot tubs, very hot or cold showers, or activities that can cause dehydration such as vigorous  exercise. What side effects may I notice from receiving this medicine? Side effects that you should report to your doctor or health care professional as soon as possible: -aching muscles and joints -confusion -fainting spells -fast or irregular heartbeat -fever or chills, sore throat -increased hunger or thirst -increased urination -loss of balance, difficulty walking or falls -stiffness, spasms, trembling -uncontrollable head, mouth, neck, arm, or leg movements -unusually weak or tired Side effects that usually do not require medical attention (report to your doctor or health care professional if they continue or are bothersome): -constipation -decreased sexual ability -difficulty sleeping -drowsiness or dizziness -increase or decrease in saliva -nausea, vomiting -weight gain This list may not describe all possible side effects. Call your doctor for medical advice about side effects. You may report side effects to FDA at 1-800-FDA-1088. Where should I keep my medicine? Keep out of the reach of children. Store at room temperature between 15 and 25 degrees C (59 and 77 degrees F). Protect from light. Throw away any unused medicine after the expiration date. NOTE: This sheet is a summary. It may not cover all possible information. If you have questions about this medicine, talk to your doctor, pharmacist, or health care provider.  2018 [employer] Standard (2016-03-25 18:50:25)

## 2017-07-31 NOTE — Progress Notes (Signed)
Psychiatric Initial Adult Assessment   Patient Identification: Nicole Bishop MRN:  [id number] Date of Evaluation:  07/31/2017 Referral Source: Dr.Jasmine Candiss Norse Chief Complaint:  ' I am seeing people.'  Chief Complaint    Establish Care; Hallucinations; Anxiety; Depression     Visit Diagnosis:    ICD-10-CM   1. MDD (major depressive disorder), single episode, moderate (HCC) F32.1 escitalopram (LEXAPRO) 20 MG tablet    traZODone (DESYREL) 50 MG tablet  2. Psychosis, unspecified psychosis type ([place]) F29 risperiDONE (RISPERDAL) 0.5 MG tablet    History of Present Illness: Nicole Bishop is an 83 year old African-American female who is widowed, lives in [place], has a history of depression as well as medical problems like osteoarthritis, hypertension, gastroesophageal reflux disease, history of abdominal aortic aneurysm, vitamin B12 deficiency, iron deficiency , presented to the clinic today to establish care.  Pt presented today with her daughter Nicole Bishop.  Nicole Bishop also participated in the evaluation and provided collateral information.  Nicole Bishop today presented in a wheelchair due to recent bilateral knee surgery that was done.  Nicole Bishop reports she has been dealing with depression since the past several months.  She was started on a medication called Lexapro ,gradually titrated up to 20 mg.  She reports she has been on it since June or July 2018.  Nicole Bishop reports she continues to have some sadness on and off, some crying spells, feeling nervous and on the edge as well as sleep issues.  She reports the Lexapro does help  and she denies any side effects to the medication at this time.  Patient reports increased appetite on and off.  Per patient's daughter patient stress eats and copes with her depression and anxiety by eating more.  Patient also reports sleep issues.  She reports that she gets up to a few hours at night.  Per daughter Nicole Bishop ,patient does a lot of talking in her sleep and is not sleeping  well.  Patient reports hallucinations since the past several months.  Patient reports that she first saw people in her house when she was on her couch during the day, several months ago .  She reports that now it happens on a regular basis.  She reports that she sees people come and go, come to her house with grocery bags, they cook food and bring it and eat at her house as well as do a lot of other things in her house.  Patient reports she sometimes hear these people talking to her.  Patient also reports that she hears knocks on the door on and off.  Patient denies any command hallucinations at this time.  She does report some paranoia.  She reports she sometimes feels these people are going to hurt her.  She reports she has called the police on these people several times.  He reports these hallucinations as frustrating.  Patient denies any history of trauma in the past.  Denies any extreme anxiety or agitation or panic attacks at this time.  She does have multiple medical problems including vitamin B12 deficiency, iron deficiency.  Per daughter Nicole Bishop ,she is getting her iron replaced as well as is on a vitamin B12 replacement at this time.Pt lives alone in her house, is currently status post bilateral knee replacement.  She also has upcoming cataract surgery.  She had one cataract surgery in November and one is coming in January.  Have support from her daughter who is with her today.  Her daughter checks on her on a regular basis and  also make sure she takes her medications.     Associated Signs/Symptoms: Depression Symptoms:  depressed mood, anxiety, (Hypo) Manic Symptoms:  Delusions, Anxiety Symptoms:  anxiety unspecified Psychotic Symptoms:  Delusions, Hallucinations: Auditory Visual Paranoia, PTSD Symptoms: Negative  Past Psychiatric History: History of depression since the past several months.  She denies any history of suicide attempts.  She denies any past inpatient admissions for  mental health problems.  Previous Psychotropic Medications: Yes , lexapro - recently started by her PMD   Substance Abuse History in the last 12 months:  No.  Consequences of Substance Abuse: Negative  Past Medical History:  Past Medical History:  Diagnosis Date  . Anemia   . Anxiety   . Arthritis   . Chronic kidney disease   . Dysrhythmia    gets tachy if anxious or excited  . GERD (gastroesophageal reflux disease)   . Hypertension   . IDA (iron deficiency anemia) 12/14/2014  . Myocardial infarction ([place]) 1992  . Renal insufficiency     Past Surgical History:  Procedure Laterality Date  . CATARACT EXTRACTION W/PHACO Left 07/15/2017   Procedure: CATARACT EXTRACTION PHACO AND INTRAOCULAR LENS PLACEMENT (IOC)-LEFT;  Surgeon: Birder Robson, MD;  Location: [facility] ORS;  Service: Ophthalmology;  Laterality: Left;  [place] 00.36.1 AP% 13.9 CDE 5.02 Fluid Pack lot # [id number]  . CHOLECYSTECTOMY    . COLONOSCOPY WITH PROPOFOL N/A 04/10/2015   Procedure: COLONOSCOPY WITH PROPOFOL;  Surgeon: Manya Silvas, MD;  Location: [facility] ENDOSCOPY;  Service: Endoscopy;  Laterality: N/A;  . ESOPHAGOGASTRODUODENOSCOPY (EGD) WITH PROPOFOL N/A 04/10/2015   Procedure: ESOPHAGOGASTRODUODENOSCOPY (EGD) WITH PROPOFOL;  Surgeon: Manya Silvas, MD;  Location: [facility] ENDOSCOPY;  Service: Endoscopy;  Laterality: N/A;  . fatty tumor removed on left shoulder    . gallbladdedr removed    . JOINT REPLACEMENT Bilateral    total knee replacements  . REPLACEMENT TOTAL KNEE BILATERAL Bilateral     Family Psychiatric History:Denies history of mental health problems in the family.  Father used to be an alcoholic. She denies any suicide in her family.  Family History:  Family History  Problem Relation Age of Onset  . Arthritis Mother   . Hypertension Mother   . Stroke Mother   . Hypertension Father   . Heart attack Father   . Breast cancer Neg Hx     Social History:   Social History   Socioeconomic History   . Marital status: Widowed    Spouse name: None  . Number of children: 9  . Years of education: None  . Highest education level: 11th grade  Social Needs  . Financial resource strain: None  . Food insecurity - worry: None  . Food insecurity - inability: None  . Transportation needs - medical: None  . Transportation needs - non-medical: None  Occupational History    Comment: retired  Tobacco Use  . Smoking status: Never Smoker  . Smokeless tobacco: Never Used  Substance and Sexual Activity  . Alcohol use: No  . Drug use: No  . Sexual activity: No  Other Topics Concern  . None  Social History Narrative  . None    Additional Social History: Patient is widowed.  Patient lives in [place].  Patient lives alone at this time.  Patient does have support from her daughters.  Patient has 8 living children at this time.  Patient went up to 11th grade.  Patient used to work in the past , domestic type of work.  Allergies:  Allergies  Allergen Reactions  . Penicillins Rash  . Penicillins Cross Reactors   . Atorvastatin Rash  . Celebrex [Celecoxib] Other (See Comments)    Constipation and stomach upset  . Felodipine Nausea Only  . Oxaprozin Nausea Only  . Rofecoxib Other (See Comments)    GI upset  . Simvastatin Rash    Metabolic Disorder Labs: No results found for: HGBA1C, MPG No results found for: PROLACTIN Lab Results  Component Value Date   CHOL 212 (H) 05/21/2011   TRIG 112.0 05/21/2011   HDL 43.60 05/21/2011   CHOLHDL 5 05/21/2011   VLDL 22.4 05/21/2011     Current Medications: Current Outpatient Medications  Medication Sig Dispense Refill  . amLODipine (NORVASC) 5 MG tablet Take 5 mg by mouth daily.     Marland Kitchen aspirin 81 MG tablet Take 81 mg by mouth every other day.      . Cholecalciferol (VITAMIN D3) 1000 UNITS CAPS Take 1 capsule by mouth. Reported on 10/13/2015    . cyanocobalamin (,VITAMIN B-12,) 1000 MCG/ML injection Inject into the muscle.     .  CYANOCOBALAMIN PO Take by mouth.    . docusate sodium (COLACE) 100 MG capsule Take 100 mg by mouth daily.     Marland Kitchen escitalopram (LEXAPRO) 20 MG tablet Take 1 tablet (20 mg total) by mouth daily. 90 tablet 1  . esomeprazole (NEXIUM) 40 MG capsule Take 40 mg by mouth daily as needed.     . ferrous sulfate 325 (65 FE) MG tablet Take 325 mg by mouth 3 (three) times daily with meals. Reported on 10/13/2015    . labetalol (NORMODYNE) 100 MG tablet Take 0.5 tablets (50 mg total) by mouth at bedtime. 15 tablet 0  . loratadine (CLARITIN) 10 MG tablet Take 1 tablet (10 mg total) by mouth daily. (Patient taking differently: Take 10 mg by mouth daily as needed for allergies. ) 30 tablet 0  . losartan (COZAAR) 50 MG tablet Take 25 mg by mouth daily.     . mometasone (NASONEX) 50 MCG/ACT nasal spray Place 2 sprays into the nose daily.      Marland Kitchen olopatadine (PATANOL) 0.1 % ophthalmic solution Place 1 drop into both eyes 2 (two) times daily. At 6-8 hours intervals     . traMADol-acetaminophen (ULTRACET) 37.5-325 MG tablet Take by mouth.    . vitamin B-12 (CYANOCOBALAMIN) 500 MCG tablet Take 1,000 mcg by mouth daily.     . risperiDONE (RISPERDAL) 0.5 MG tablet Take 1 tablet (0.5 mg total) by mouth at bedtime. 30 tablet 1  . traZODone (DESYREL) 50 MG tablet Take 0.5-1 tablets (25-50 mg total) by mouth at bedtime as needed for sleep. 30 tablet 1   No current facility-administered medications for this visit.     Neurologic: Headache: No Seizure: No Paresthesias:No  Musculoskeletal: Strength & Muscle Tone: within normal limits Gait & Station: in a wheel chair  Patient leans: N/A  Psychiatric Specialty Exam: Review of Systems  Psychiatric/Behavioral: Positive for depression and hallucinations. The patient is nervous/anxious and has insomnia.   All other systems reviewed and are negative.   Blood pressure 101/65, pulse 73, temperature 98.9 F (37.2 C), temperature source Oral.There is no height or weight on file  to calculate BMI.  General Appearance: Casual  Eye Contact:  Fair  Speech:  Normal Rate  Volume:  Normal  Mood:  Anxious  Affect:  Congruent  Thought Process:  Goal Directed and Descriptions of Associations: Intact  Orientation:  Full (Time, Place,  and Person)  Thought Content:  Delusions, Hallucinations: Auditory Visual and Paranoid Ideation  Suicidal Thoughts:  No  Homicidal Thoughts:  No  Memory:  Immediate;   Fair Recent;   Fair Remote;   Fair  Judgement:  Fair  Insight:  Fair  Psychomotor Activity:  Normal  Concentration:  Concentration: Fair and Attention Span: Fair  Recall:  [employer] of Knowledge:Fair  Language: Fair  Akathisia:  No  Handed:  Right  AIMS (if indicated): 0  Assets:  Communication Skills Desire for Improvement Housing Social Support  ADL's:  Intact  Cognition: WNL  Sleep: poor    Treatment Plan Summary: Jakeya is an 83 year old African-American female who has a history of depression as well as multiple medical problems as well as recent knee surgery, upcoming cataract surgery who presented to the clinic today to establish care.  Nicole Bishop today presented along with her daughter Nicole Bishop who assisted with providing collateral information.  Nicole Bishop continues to have a lot of hallucination predominantly visual hallucinations, which has been distressing to her.  This has been ongoing since the past several months.  Nicole Bishop denies any significant stressors at this time other than her upcoming surgery.  Nicole Bishop denies any history of substance abuse, denies any history of depression in her family.  An MMSE was completed on the patient to rule out cognitive impairment.  Patient scored 25 out of 30 which is within normal limits.  However she will benefit from a CT scan of her brain, will refer to neurology for the same.   discussed medications and plan as noted below. Medication management and Plan see below   Plan  For depression Continue Lexapro 20 mg p.o. Daily PHQ=9 =  9    Psychosis Add risperidone 0.5 mg p.o. Nightly  Completed an MMSE on the patient , she scored 25 out of 30 -during evaluation she was oriented to place situation day, time and her immediate and recent memory seemed to be intact.  However since she does have new onset psychosis will need a CT scan of her brain.  Will refer her to a neurologist at this time for further evaluation.  For insomnia Trazodone 25-50 mg p.o. nightly as needed.  We will get the following labs-TSH, urine analysis, vitamin D.  She already has vitamin B12 and folate completed recently and they were within normal limits.  She does have iron deficiency which is being replaced.  Provided medication education provided handouts.  Follow-up in 2-3 weeks or sooner if needed.  More than 50 % of the time was spent for psychoeducation and supportive psychotherapy and care coordination.  This note was generated in part or whole with voice recognition software. Voice recognition is usually quite accurate but there are transcription errors that can and very often do occur. I apologize for any typographical errors that were not detected and corrected.       Ursula Alert, MD 12/13/20181:04 PM

## 2017-08-01 LAB — VITAMIN D 25 HYDROXY (VIT D DEFICIENCY, FRACTURES): VIT D 25 HYDROXY: 21.6 ng/mL — AB (ref 30.0–100.0)

## 2017-08-05 ENCOUNTER — Inpatient Hospital Stay: Payer: Medicare HMO | Attending: Hematology and Oncology

## 2017-08-05 DIAGNOSIS — I129 Hypertensive chronic kidney disease with stage 1 through stage 4 chronic kidney disease, or unspecified chronic kidney disease: Secondary | ICD-10-CM | POA: Insufficient documentation

## 2017-08-05 DIAGNOSIS — Z79899 Other long term (current) drug therapy: Secondary | ICD-10-CM | POA: Insufficient documentation

## 2017-08-05 DIAGNOSIS — Z96653 Presence of artificial knee joint, bilateral: Secondary | ICD-10-CM | POA: Insufficient documentation

## 2017-08-05 DIAGNOSIS — K2970 Gastritis, unspecified, without bleeding: Secondary | ICD-10-CM | POA: Insufficient documentation

## 2017-08-05 DIAGNOSIS — I714 Abdominal aortic aneurysm, without rupture: Secondary | ICD-10-CM | POA: Insufficient documentation

## 2017-08-05 DIAGNOSIS — K449 Diaphragmatic hernia without obstruction or gangrene: Secondary | ICD-10-CM | POA: Insufficient documentation

## 2017-08-05 DIAGNOSIS — D509 Iron deficiency anemia, unspecified: Secondary | ICD-10-CM | POA: Insufficient documentation

## 2017-08-05 DIAGNOSIS — E538 Deficiency of other specified B group vitamins: Secondary | ICD-10-CM | POA: Insufficient documentation

## 2017-08-05 DIAGNOSIS — K5909 Other constipation: Secondary | ICD-10-CM | POA: Insufficient documentation

## 2017-08-05 DIAGNOSIS — K648 Other hemorrhoids: Secondary | ICD-10-CM | POA: Insufficient documentation

## 2017-08-05 DIAGNOSIS — N281 Cyst of kidney, acquired: Secondary | ICD-10-CM | POA: Insufficient documentation

## 2017-08-05 DIAGNOSIS — N183 Chronic kidney disease, stage 3 (moderate): Secondary | ICD-10-CM | POA: Insufficient documentation

## 2017-08-05 DIAGNOSIS — Z7982 Long term (current) use of aspirin: Secondary | ICD-10-CM | POA: Insufficient documentation

## 2017-08-05 NOTE — Progress Notes (Signed)
Results were faxed and confirmed to patient primary care doctor and pt was called and given result and a copy was mailed to patient also.

## 2017-08-07 ENCOUNTER — Inpatient Hospital Stay: Payer: Medicare HMO

## 2017-08-07 ENCOUNTER — Other Ambulatory Visit: Payer: Self-pay | Admitting: *Deleted

## 2017-08-07 DIAGNOSIS — K5909 Other constipation: Secondary | ICD-10-CM | POA: Diagnosis not present

## 2017-08-07 DIAGNOSIS — I714 Abdominal aortic aneurysm, without rupture: Secondary | ICD-10-CM | POA: Diagnosis not present

## 2017-08-07 DIAGNOSIS — N281 Cyst of kidney, acquired: Secondary | ICD-10-CM | POA: Diagnosis not present

## 2017-08-07 DIAGNOSIS — D509 Iron deficiency anemia, unspecified: Secondary | ICD-10-CM

## 2017-08-07 DIAGNOSIS — H2511 Age-related nuclear cataract, right eye: Secondary | ICD-10-CM | POA: Diagnosis not present

## 2017-08-07 DIAGNOSIS — N183 Chronic kidney disease, stage 3 (moderate): Secondary | ICD-10-CM | POA: Diagnosis not present

## 2017-08-07 DIAGNOSIS — Z96653 Presence of artificial knee joint, bilateral: Secondary | ICD-10-CM | POA: Diagnosis not present

## 2017-08-07 DIAGNOSIS — K449 Diaphragmatic hernia without obstruction or gangrene: Secondary | ICD-10-CM | POA: Diagnosis not present

## 2017-08-07 DIAGNOSIS — K648 Other hemorrhoids: Secondary | ICD-10-CM | POA: Diagnosis not present

## 2017-08-07 DIAGNOSIS — Z7982 Long term (current) use of aspirin: Secondary | ICD-10-CM | POA: Diagnosis not present

## 2017-08-07 DIAGNOSIS — Z79899 Other long term (current) drug therapy: Secondary | ICD-10-CM | POA: Diagnosis not present

## 2017-08-07 DIAGNOSIS — D508 Other iron deficiency anemias: Secondary | ICD-10-CM

## 2017-08-07 DIAGNOSIS — E538 Deficiency of other specified B group vitamins: Secondary | ICD-10-CM | POA: Diagnosis not present

## 2017-08-07 DIAGNOSIS — I129 Hypertensive chronic kidney disease with stage 1 through stage 4 chronic kidney disease, or unspecified chronic kidney disease: Secondary | ICD-10-CM | POA: Diagnosis not present

## 2017-08-07 DIAGNOSIS — K2970 Gastritis, unspecified, without bleeding: Secondary | ICD-10-CM | POA: Diagnosis not present

## 2017-08-07 LAB — CBC WITH DIFFERENTIAL/PLATELET
Basophils Absolute: 0 10*3/uL (ref 0–0.1)
Basophils Relative: 1 %
Eosinophils Absolute: 0.1 10*3/uL (ref 0–0.7)
Eosinophils Relative: 2 %
HCT: 36.1 % (ref 35.0–47.0)
Hemoglobin: 11.6 g/dL — ABNORMAL LOW (ref 12.0–16.0)
Lymphocytes Relative: 25 %
Lymphs Abs: 1.2 10*3/uL (ref 1.0–3.6)
MCH: 26.3 pg (ref 26.0–34.0)
MCHC: 32 g/dL (ref 32.0–36.0)
MCV: 82.2 fL (ref 80.0–100.0)
Monocytes Absolute: 0.4 10*3/uL (ref 0.2–0.9)
Monocytes Relative: 9 %
Neutro Abs: 2.9 10*3/uL (ref 1.4–6.5)
Neutrophils Relative %: 63 %
Platelets: 217 10*3/uL (ref 150–440)
RBC: 4.39 MIL/uL (ref 3.80–5.20)
RDW: 17.5 % — ABNORMAL HIGH (ref 11.5–14.5)
WBC: 4.7 10*3/uL (ref 3.6–11.0)

## 2017-08-07 LAB — URINALYSIS, COMPLETE (UACMP) WITH MICROSCOPIC
BILIRUBIN URINE: NEGATIVE
Glucose, UA: NEGATIVE mg/dL
Hgb urine dipstick: NEGATIVE
Ketones, ur: NEGATIVE mg/dL
Nitrite: NEGATIVE
PH: 5 (ref 5.0–8.0)
Protein, ur: 30 mg/dL — AB
SPECIFIC GRAVITY, URINE: 1.021 (ref 1.005–1.030)

## 2017-08-07 LAB — FERRITIN: Ferritin: 46 ng/mL (ref 11–307)

## 2017-08-14 ENCOUNTER — Other Ambulatory Visit: Payer: Self-pay

## 2017-08-14 ENCOUNTER — Ambulatory Visit (INDEPENDENT_AMBULATORY_CARE_PROVIDER_SITE_OTHER): Payer: Medicare HMO | Admitting: Psychiatry

## 2017-08-14 ENCOUNTER — Encounter: Payer: Self-pay | Admitting: Psychiatry

## 2017-08-14 VITALS — BP 115/76 | HR 70 | Temp 98.5°F

## 2017-08-14 DIAGNOSIS — E559 Vitamin D deficiency, unspecified: Secondary | ICD-10-CM

## 2017-08-14 DIAGNOSIS — F321 Major depressive disorder, single episode, moderate: Secondary | ICD-10-CM

## 2017-08-14 DIAGNOSIS — F29 Unspecified psychosis not due to a substance or known physiological condition: Secondary | ICD-10-CM | POA: Diagnosis not present

## 2017-08-14 MED ORDER — TRAZODONE HCL 50 MG PO TABS: 25.0000 mg | ORAL_TABLET | Freq: Every evening | ORAL | 2 refills | Status: DC | PRN

## 2017-08-14 MED ORDER — RISPERIDONE 1 MG PO TABS: 1.0000 mg | ORAL_TABLET | Freq: Every day | ORAL | 2 refills | Status: DC

## 2017-08-14 NOTE — Progress Notes (Signed)
[facility] MD OP Progress Note  08/14/2017 11:05 AM Nicole Bishop  MRN:  [id number]  Chief Complaint: ' I still see them , but they are far away.'  Chief Complaint    Follow-up     HPI: Nicole Bishop is an 83 year old African-American female who is widowed, lives in [place], has a history of depression as well as medical problems like osteoarthritis, hypertension, GERD, history of abdominal aortic aneurysm, vitamin B12 deficiency, vitamin D, iron deficiency, presented to the clinic today for a follow-up visit.  Nicole Bishop presented today with her daughter Nicole Bishop.  Nicole Bishop is wheelchair-bound.  Zya today reports that she has been doing okay.  She reports that she continues to struggle with seeing people who come into her house.  She reports that the medication risperidone has been keeping the people away and they are not coming too close to her.  She however continues to see them on a regular basis but they do not distress her like they used to before.  She denies any side effects to the risperidone at this time.  She has been trying to take the medication consistently.  Nicole Bishop reports that she has been feeling tired and also had some episodes of feeling dizzy on and off.  According to [facility] , patient takes the sleep medication and the risperidone at bedtime and does not go to bed but try to override it and keep watching TV.  That could be the reason she feels dizzy according to the daughter. This with patient to take the medication and go to bed.  Discussed with her the importance of having a good night sleep.  Kathren also has vitamin D deficiency as per recent labs that were ordered.  She will follow-up with her PMD about the same.  She will also follow up with neurology for possible CT scan and rule out organic causes of psychosis.   Visit Diagnosis:    ICD-10-CM   1. MDD (major depressive disorder), single episode, moderate (HCC) F32.1 traZODone (DESYREL) 50 MG tablet    risperiDONE (RISPERDAL) 1 MG  tablet  2. Psychosis, unspecified psychosis type ([place]) F29   3. Vitamin D deficiency E55.9     Past Psychiatric History: History of depression since the past several months.  She denies any history of suicide attempts.  She denies any past inpatient admission for mental health problems.    Past Medical History:  Past Medical History:  Diagnosis Date  . Anemia   . Anxiety   . Arthritis   . Chronic kidney disease   . Dysrhythmia    gets tachy if anxious or excited  . GERD (gastroesophageal reflux disease)   . Hypertension   . IDA (iron deficiency anemia) 12/14/2014  . Myocardial infarction ([place]) 1992  . Renal insufficiency     Past Surgical History:  Procedure Laterality Date  . CATARACT EXTRACTION W/PHACO Left 07/15/2017   Procedure: CATARACT EXTRACTION PHACO AND INTRAOCULAR LENS PLACEMENT (IOC)-LEFT;  Surgeon: Birder Robson, MD;  Location: [facility] ORS;  Service: Ophthalmology;  Laterality: Left;  [place] 00.36.1 AP% 13.9 CDE 5.02 Fluid Pack lot # [id number]  . CHOLECYSTECTOMY    . COLONOSCOPY WITH PROPOFOL N/A 04/10/2015   Procedure: COLONOSCOPY WITH PROPOFOL;  Surgeon: Manya Silvas, MD;  Location: [facility] ENDOSCOPY;  Service: Endoscopy;  Laterality: N/A;  . ESOPHAGOGASTRODUODENOSCOPY (EGD) WITH PROPOFOL N/A 04/10/2015   Procedure: ESOPHAGOGASTRODUODENOSCOPY (EGD) WITH PROPOFOL;  Surgeon: Manya Silvas, MD;  Location: [facility] ENDOSCOPY;  Service: Endoscopy;  Laterality: N/A;  . fatty  tumor removed on left shoulder    . gallbladdedr removed    . JOINT REPLACEMENT Bilateral    total knee replacements  . REPLACEMENT TOTAL KNEE BILATERAL Bilateral     Family Psychiatric History: Denies history of mental health problems in the family.  Father used to be an alcoholic.  She denies any suicide in her family  Family History:  Family History  Problem Relation Age of Onset  . Arthritis Mother   . Hypertension Mother   . Stroke Mother   . Hypertension Father   . Heart attack Father   .  Breast cancer Neg Hx      Substance abuse history: Denies   Social History: Patient is widowed.  Patient lives in [place].  Patient lives alone at this time.  Patient does have support from her daughter.  Patient has 8 living children at this time.  Patient went up to 11th grade.  Patient used to work in the past, domestic type of form Social History   Socioeconomic History  . Marital status: Widowed    Spouse name: None  . Number of children: 9  . Years of education: None  . Highest education level: 11th grade  Social Needs  . Financial resource strain: None  . Food insecurity - worry: None  . Food insecurity - inability: None  . Transportation needs - medical: None  . Transportation needs - non-medical: None  Occupational History    Comment: retired  Tobacco Use  . Smoking status: Never Smoker  . Smokeless tobacco: Never Used  Substance and Sexual Activity  . Alcohol use: No  . Drug use: No  . Sexual activity: No  Other Topics Concern  . None  Social History Narrative  . None    Allergies:  Allergies  Allergen Reactions  . Penicillins Rash  . Penicillins Cross Reactors   . Atorvastatin Rash  . Celebrex [Celecoxib] Other (See Comments)    Constipation and stomach upset  . Felodipine Nausea Only  . Oxaprozin Nausea Only  . Rofecoxib Other (See Comments)    GI upset  . Simvastatin Rash    Metabolic Disorder Labs: No results found for: HGBA1C, MPG No results found for: PROLACTIN Lab Results  Component Value Date   CHOL 212 (H) 05/21/2011   TRIG 112.0 05/21/2011   HDL 43.60 05/21/2011   CHOLHDL 5 05/21/2011   VLDL 22.4 05/21/2011   Lab Results  Component Value Date   TSH 2.268 07/31/2017    Therapeutic Level Labs: No results found for: LITHIUM No results found for: VALPROATE No components found for:  CBMZ  Current Medications: Current Outpatient Medications  Medication Sig Dispense Refill  . amLODipine (NORVASC) 5 MG tablet Take 5 mg by  mouth daily.     Marland Kitchen aspirin 81 MG tablet Take 81 mg by mouth every other day.      . Cholecalciferol (VITAMIN D3) 1000 UNITS CAPS Take 1 capsule by mouth. Reported on 10/13/2015    . cyanocobalamin (,VITAMIN B-12,) 1000 MCG/ML injection Inject into the muscle.     . CYANOCOBALAMIN PO Take by mouth.    . docusate sodium (COLACE) 100 MG capsule Take 100 mg by mouth daily.     Marland Kitchen escitalopram (LEXAPRO) 20 MG tablet Take 1 tablet (20 mg total) by mouth daily. 90 tablet 1  . esomeprazole (NEXIUM) 40 MG capsule Take 40 mg by mouth daily as needed.     . ferrous sulfate 325 (65 FE) MG tablet Take 325  mg by mouth 3 (three) times daily with meals. Reported on 10/13/2015    . labetalol (NORMODYNE) 100 MG tablet Take 0.5 tablets (50 mg total) by mouth at bedtime. 15 tablet 0  . loratadine (CLARITIN) 10 MG tablet Take 1 tablet (10 mg total) by mouth daily. (Patient taking differently: Take 10 mg by mouth daily as needed for allergies. ) 30 tablet 0  . losartan (COZAAR) 50 MG tablet Take 25 mg by mouth daily.     . mometasone (NASONEX) 50 MCG/ACT nasal spray Place 2 sprays into the nose daily.      Marland Kitchen olopatadine (PATANOL) 0.1 % ophthalmic solution Place 1 drop into both eyes 2 (two) times daily. At 6-8 hours intervals     . traMADol-acetaminophen (ULTRACET) 37.5-325 MG tablet Take by mouth.    . traZODone (DESYREL) 50 MG tablet Take 0.5-1 tablets (25-50 mg total) by mouth at bedtime as needed for sleep. 30 tablet 2  . vitamin B-12 (CYANOCOBALAMIN) 500 MCG tablet Take 1,000 mcg by mouth daily.     . risperiDONE (RISPERDAL) 1 MG tablet Take 1 tablet (1 mg total) by mouth at bedtime. 30 tablet 2   No current facility-administered medications for this visit.      Musculoskeletal: Strength & Muscle Tone: within normal limits Gait & Station: in wheel chair Patient leans: N/A  Psychiatric Specialty Exam: Review of Systems  Psychiatric/Behavioral: Positive for hallucinations.  All other systems reviewed and  are negative.   Blood pressure 115/76, pulse 70, temperature 98.5 F (36.9 C), temperature source Oral.There is no height or weight on file to calculate BMI.  General Appearance: Casual  Eye Contact:  Fair  Speech:  Clear and Coherent  Volume:  Normal  Mood:  Euthymic  Affect:  Congruent  Thought Process:  Goal Directed and Descriptions of Associations: Intact  Orientation:  Full (Time, Place, and Person)  Thought Content: Hallucinations: Visual - sees people in her house - they now seem to be far away  Suicidal Thoughts:  No  Homicidal Thoughts:  No  Memory:  Immediate;   Fair Recent;   Fair Remote;   Fair  Judgement:  Fair  Insight:  Fair  Psychomotor Activity:  Normal  Concentration:  Concentration: Fair and Attention Span: Fair  Recall:  [employer] of Knowledge: Fair  Language: Fair  Akathisia:  No  Handed:  Right  AIMS (if indicated): 0  Assets:  Communication Skills Desire for Improvement Housing Social Support  ADL's:  Intact  Cognition: WNL  Sleep:  Fair   Screenings: AIMS     Office Visit from 08/14/2017 in [place] Total Score  0    PHQ2-9     Nutrition from 05/25/2015 in [place]  PHQ-2 Total Score  1       Assessment and Plan: Jakayla is an 83 year old African-American female who has a history of depression as well as multiple medical problems as well as recent knee surgery, upcoming cataract surgery, who presented to the clinic today for a follow-up visit.  Toula is wheelchair-bound.  Yitta presented along with her daughter Nicole Bishop who assisted with providing collateral information.  Octavia reports she continues to have some hallucinations, predominantly visual hallucination of seeing people in her house.  She however reports that the people ( hallucinations) seems to be far away and they do not come too close to her anymore.  She currently denies any suicidality.  We will continue medication as  noted below.  Plan  For depression Continue Lexapro 20 mg p.o. daily Increase risperidone to 1 mg p.o. nightly  For psychosis Unknown if her psychosis is due to her depression. Increase risperidone to 1 mg p.o. nightly [place] equals 0.  Patient to be referred for neurology evaluation.  Patient had an MMSE done on 07/31/2017 and she scored 25 out of 30 during that evaluation .  She also appears to be oriented to place, situation, day and time and her immediate and recent memory seems to be intact.  However since she does have new onset psychosis she may need a CT scan of her brain and hence a referral to neurology.  For insomnia Continue trazodone 25-50 mg p.o. nightly. Discussed sleep hygiene with patient  Reviewed labs ordered-TSH-within normal limits ( 07/31/2017) , vitamin D-low-( 07/31/17) discussed with her to follow-up with PMD. UA - 08/07/17 - bacteria moderate , WBC - wnl - will monitor closely.  Follow-up in 6 weeks or sooner sooner if needed.  More than 50 % of the time was spent for psychoeducation and supportive psychotherapy and care coordination.  This note was generated in part or whole with voice recognition software. Voice recognition is usually quite accurate but there are transcription errors that can and very often do occur. I apologize for any typographical errors that were not detected and corrected.        Ursula Alert, MD 08/14/2017, 11:05 AM

## 2017-08-18 ENCOUNTER — Encounter: Payer: Self-pay | Admitting: *Deleted

## 2017-08-25 DIAGNOSIS — N183 Chronic kidney disease, stage 3 (moderate): Secondary | ICD-10-CM | POA: Diagnosis not present

## 2017-08-25 DIAGNOSIS — N2581 Secondary hyperparathyroidism of renal origin: Secondary | ICD-10-CM | POA: Diagnosis not present

## 2017-08-25 DIAGNOSIS — D631 Anemia in chronic kidney disease: Secondary | ICD-10-CM | POA: Diagnosis not present

## 2017-08-25 DIAGNOSIS — I10 Essential (primary) hypertension: Secondary | ICD-10-CM | POA: Diagnosis not present

## 2017-08-26 ENCOUNTER — Encounter: Payer: Self-pay | Admitting: *Deleted

## 2017-08-26 ENCOUNTER — Ambulatory Visit: Payer: Medicare HMO | Admitting: Certified Registered Nurse Anesthetist

## 2017-08-26 ENCOUNTER — Ambulatory Visit
Admission: RE | Admit: 2017-08-26 | Discharge: 2017-08-26 | Disposition: A | Discharge status: Home / Self Care | Payer: Medicare HMO | Source: Ambulatory Visit | Attending: Ophthalmology | Admitting: Ophthalmology

## 2017-08-26 ENCOUNTER — Encounter
Admission: RE | Disposition: A | Discharge status: Home / Self Care | Payer: Self-pay | Source: Ambulatory Visit | Attending: Ophthalmology

## 2017-08-26 DIAGNOSIS — K5790 Diverticulosis of intestine, part unspecified, without perforation or abscess without bleeding: Secondary | ICD-10-CM | POA: Insufficient documentation

## 2017-08-26 DIAGNOSIS — Z8711 Personal history of peptic ulcer disease: Secondary | ICD-10-CM | POA: Insufficient documentation

## 2017-08-26 DIAGNOSIS — D649 Anemia, unspecified: Secondary | ICD-10-CM | POA: Diagnosis not present

## 2017-08-26 DIAGNOSIS — M810 Age-related osteoporosis without current pathological fracture: Secondary | ICD-10-CM | POA: Diagnosis not present

## 2017-08-26 DIAGNOSIS — I2510 Atherosclerotic heart disease of native coronary artery without angina pectoris: Secondary | ICD-10-CM | POA: Diagnosis not present

## 2017-08-26 DIAGNOSIS — R42 Dizziness and giddiness: Secondary | ICD-10-CM | POA: Diagnosis not present

## 2017-08-26 DIAGNOSIS — G4730 Sleep apnea, unspecified: Secondary | ICD-10-CM | POA: Diagnosis not present

## 2017-08-26 DIAGNOSIS — K449 Diaphragmatic hernia without obstruction or gangrene: Secondary | ICD-10-CM | POA: Insufficient documentation

## 2017-08-26 DIAGNOSIS — I129 Hypertensive chronic kidney disease with stage 1 through stage 4 chronic kidney disease, or unspecified chronic kidney disease: Secondary | ICD-10-CM | POA: Diagnosis not present

## 2017-08-26 DIAGNOSIS — Z880 Allergy status to penicillin: Secondary | ICD-10-CM | POA: Insufficient documentation

## 2017-08-26 DIAGNOSIS — R609 Edema, unspecified: Secondary | ICD-10-CM | POA: Diagnosis not present

## 2017-08-26 DIAGNOSIS — Z9049 Acquired absence of other specified parts of digestive tract: Secondary | ICD-10-CM | POA: Diagnosis not present

## 2017-08-26 DIAGNOSIS — Z96659 Presence of unspecified artificial knee joint: Secondary | ICD-10-CM | POA: Diagnosis not present

## 2017-08-26 DIAGNOSIS — J45909 Unspecified asthma, uncomplicated: Secondary | ICD-10-CM | POA: Diagnosis not present

## 2017-08-26 DIAGNOSIS — Z888 Allergy status to other drugs, medicaments and biological substances status: Secondary | ICD-10-CM | POA: Insufficient documentation

## 2017-08-26 DIAGNOSIS — N189 Chronic kidney disease, unspecified: Secondary | ICD-10-CM | POA: Diagnosis not present

## 2017-08-26 DIAGNOSIS — M1990 Unspecified osteoarthritis, unspecified site: Secondary | ICD-10-CM | POA: Diagnosis not present

## 2017-08-26 DIAGNOSIS — K219 Gastro-esophageal reflux disease without esophagitis: Secondary | ICD-10-CM | POA: Insufficient documentation

## 2017-08-26 DIAGNOSIS — Z9849 Cataract extraction status, unspecified eye: Secondary | ICD-10-CM | POA: Diagnosis not present

## 2017-08-26 DIAGNOSIS — I252 Old myocardial infarction: Secondary | ICD-10-CM | POA: Insufficient documentation

## 2017-08-26 DIAGNOSIS — F329 Major depressive disorder, single episode, unspecified: Secondary | ICD-10-CM | POA: Insufficient documentation

## 2017-08-26 DIAGNOSIS — I10 Essential (primary) hypertension: Secondary | ICD-10-CM | POA: Diagnosis not present

## 2017-08-26 DIAGNOSIS — R002 Palpitations: Secondary | ICD-10-CM | POA: Insufficient documentation

## 2017-08-26 DIAGNOSIS — R251 Tremor, unspecified: Secondary | ICD-10-CM | POA: Insufficient documentation

## 2017-08-26 DIAGNOSIS — H2511 Age-related nuclear cataract, right eye: Secondary | ICD-10-CM | POA: Diagnosis not present

## 2017-08-26 HISTORY — DX: Tremor, unspecified: R25.1

## 2017-08-26 HISTORY — PX: CATARACT EXTRACTION W/PHACO: SHX586

## 2017-08-26 HISTORY — DX: Hypothyroidism, unspecified: E03.9

## 2017-08-26 HISTORY — DX: Dizziness and giddiness: R42

## 2017-08-26 HISTORY — DX: Personal history of other diseases of the digestive system: Z87.19

## 2017-08-26 HISTORY — DX: Dyspnea, unspecified: R06.00

## 2017-08-26 HISTORY — DX: Sleep apnea, unspecified: G47.30

## 2017-08-26 HISTORY — DX: Depression, unspecified: F32.A

## 2017-08-26 HISTORY — DX: Major depressive disorder, single episode, unspecified: F32.9

## 2017-08-26 HISTORY — DX: Atherosclerotic heart disease of native coronary artery without angina pectoris: I25.10

## 2017-08-26 HISTORY — DX: Unspecified hearing loss, unspecified ear: H91.90

## 2017-08-26 SURGERY — PHACOEMULSIFICATION, CATARACT, WITH IOL INSERTION
Anesthesia: Monitor Anesthesia Care | Site: Eye | Laterality: Right | Wound class: Clean

## 2017-08-26 MED ORDER — EPINEPHRINE PF 1 MG/ML IJ SOLN
INTRAMUSCULAR | Status: AC
  Filled 2017-08-26: qty 2

## 2017-08-26 MED ORDER — MIDAZOLAM HCL 2 MG/2ML IJ SOLN
INTRAMUSCULAR | Status: DC | PRN
  Administered 2017-08-26 (×2): 0.5 mg via INTRAVENOUS
  Administered 2017-08-26: 1 mg via INTRAVENOUS

## 2017-08-26 MED ORDER — MOXIFLOXACIN HCL 0.5 % OP SOLN
OPHTHALMIC | Status: AC
  Filled 2017-08-26: qty 3

## 2017-08-26 MED ORDER — NA CHONDROIT SULF-NA HYALURON 40-17 MG/ML IO SOLN
INTRAOCULAR | Status: DC | PRN
  Administered 2017-08-26: 1 mL via INTRAOCULAR

## 2017-08-26 MED ORDER — POVIDONE-IODINE 5 % OP SOLN
OPHTHALMIC | Status: AC
  Filled 2017-08-26: qty 30

## 2017-08-26 MED ORDER — ARMC OPHTHALMIC DILATING DROPS
1.0000 "application " | OPHTHALMIC | Status: AC
  Administered 2017-08-26 (×3): 1 via OPHTHALMIC

## 2017-08-26 MED ORDER — NA CHONDROIT SULF-NA HYALURON 40-17 MG/ML IO SOLN
INTRAOCULAR | Status: AC
  Filled 2017-08-26: qty 1

## 2017-08-26 MED ORDER — POVIDONE-IODINE 5 % OP SOLN
OPHTHALMIC | Status: DC | PRN
  Administered 2017-08-26: 1 via OPHTHALMIC

## 2017-08-26 MED ORDER — BSS IO SOLN
INTRAOCULAR | Status: DC | PRN
  Administered 2017-08-26: 4 mL via OPHTHALMIC

## 2017-08-26 MED ORDER — MOXIFLOXACIN HCL 0.5 % OP SOLN: 1.0000 [drp] | OPHTHALMIC | Status: DC | PRN

## 2017-08-26 MED ORDER — MOXIFLOXACIN HCL 0.5 % OP SOLN
OPHTHALMIC | Status: DC | PRN
  Administered 2017-08-26: 0.2 mL via OPHTHALMIC

## 2017-08-26 MED ORDER — BSS IO SOLN
INTRAOCULAR | Status: DC | PRN
  Administered 2017-08-26: 10:00:00 via OPHTHALMIC

## 2017-08-26 MED ORDER — CARBACHOL 0.01 % IO SOLN
INTRAOCULAR | Status: DC | PRN
  Administered 2017-08-26: 0.5 mL via INTRAOCULAR

## 2017-08-26 MED ORDER — ARMC OPHTHALMIC DILATING DROPS
OPHTHALMIC | Status: AC
  Filled 2017-08-26: qty 0.4

## 2017-08-26 MED ORDER — MIDAZOLAM HCL 2 MG/2ML IJ SOLN
INTRAMUSCULAR | Status: AC
  Filled 2017-08-26: qty 2

## 2017-08-26 MED ORDER — SODIUM CHLORIDE 0.9 % IV SOLN
INTRAVENOUS | Status: DC
  Administered 2017-08-26: 08:00:00 via INTRAVENOUS

## 2017-08-26 MED ORDER — LIDOCAINE HCL (PF) 4 % IJ SOLN
INTRAMUSCULAR | Status: AC
  Filled 2017-08-26: qty 5

## 2017-08-26 SURGICAL SUPPLY — 16 items
GLOVE BIO SURGEON STRL SZ8 (GLOVE) ×3 IMPLANT
GLOVE BIOGEL M 6.5 STRL (GLOVE) ×3 IMPLANT
GLOVE SURG LX 8.0 MICRO (GLOVE) ×2
GLOVE SURG LX STRL 8.0 MICRO (GLOVE) ×1 IMPLANT
GOWN STRL REUS W/ TWL LRG LVL3 (GOWN DISPOSABLE) ×2 IMPLANT
GOWN STRL REUS W/TWL LRG LVL3 (GOWN DISPOSABLE) ×4
LABEL CATARACT MEDS ST (LABEL) ×3 IMPLANT
LENS IOL TECNIS ITEC 20.5 (Intraocular Lens) ×3 IMPLANT
PACK CATARACT (MISCELLANEOUS) ×3 IMPLANT
PACK CATARACT BRASINGTON LX (MISCELLANEOUS) ×3 IMPLANT
PACK EYE AFTER SURG (MISCELLANEOUS) ×3 IMPLANT
SOL BSS BAG (MISCELLANEOUS) ×3
SOLUTION BSS BAG (MISCELLANEOUS) ×1 IMPLANT
SYR 5ML LL (SYRINGE) ×3 IMPLANT
WATER STERILE IRR 250ML POUR (IV SOLUTION) ×3 IMPLANT
WIPE NON LINTING 3.25X3.25 (MISCELLANEOUS) ×9 IMPLANT

## 2017-08-26 NOTE — Op Note (Signed)
PREOPERATIVE DIAGNOSIS:  Nuclear sclerotic cataract of the right eye.   POSTOPERATIVE DIAGNOSIS:  NUCLEAR SCLEROTIC CATARACT RIGHT EYE   OPERATIVE PROCEDURE: Procedure(s): CATARACT EXTRACTION PHACO AND INTRAOCULAR LENS PLACEMENT (IOC)   SURGEON:  Birder Robson, MD.   ANESTHESIA:  Anesthesiologist: Martha Clan, MD CRNA: Eben Burow, CRNA  1.      Managed anesthesia care. 2.      0.47ml of Shugarcaine was instilled in the eye following the paracentesis.   COMPLICATIONS:  None.   TECHNIQUE:   Stop and chop   DESCRIPTION OF PROCEDURE:  The patient was examined and consented in the preoperative holding area where the aforementioned topical anesthesia was applied to the right eye and then brought back to the Operating Room where the right eye was prepped and draped in the usual sterile ophthalmic fashion and a lid speculum was placed. A paracentesis was created with the side port blade and the anterior chamber was filled with viscoelastic. A near clear corneal incision was performed with the steel keratome. A continuous curvilinear capsulorrhexis was performed with a cystotome followed by the capsulorrhexis forceps. Hydrodissection and hydrodelineation were carried out with BSS on a blunt cannula. The lens was removed in a stop and chop  technique and the remaining cortical material was removed with the irrigation-aspiration handpiece. The capsular bag was inflated with viscoelastic and the Technis [id number]  lens was placed in the capsular bag without complication. The remaining viscoelastic was removed from the eye with the irrigation-aspiration handpiece. The wounds were hydrated. The anterior chamber was flushed with Miostat and the eye was inflated to physiologic pressure. 0.78ml of Vigamox was placed in the anterior chamber. The wounds were found to be water tight. The eye was dressed with Vigamox. The patient was given protective glasses to wear throughout the day and a shield with which to  sleep tonight. The patient was also given drops with which to begin a drop regimen today and will follow-up with me in one day. Implant Name Type Inv. Item Serial No. Manufacturer Lot No. LRB No. Used  LENS IOL DIOP 20.5 - [id number] Intraocular Lens LENS IOL DIOP 20.5 [id number]  Right 1   Procedure(s) with comments: CATARACT EXTRACTION PHACO AND INTRAOCULAR LENS PLACEMENT (IOC) (Right) - [place] 00:41.0 AP% 15.3 CDE 6.27 FLUID PACK LOT # [id number]  Electronically signed: Tim Lair 08/26/2017 9:57 AM

## 2017-08-26 NOTE — Anesthesia Postprocedure Evaluation (Signed)
Anesthesia Post Note  Patient: Nicole Bishop  Procedure(s) Performed: CATARACT EXTRACTION PHACO AND INTRAOCULAR LENS PLACEMENT (IOC) (Right Eye)  Patient location during evaluation: Short Stay Anesthesia Type: MAC Level of consciousness: awake and alert Pain management: pain level controlled Vital Signs Assessment: post-procedure vital signs reviewed and stable Respiratory status: spontaneous breathing and respiratory function stable Cardiovascular status: blood pressure returned to baseline Postop Assessment: no headache, no backache, no apparent nausea or vomiting and adequate PO intake Anesthetic complications: no     Last Vitals:  Vitals:   08/26/17 0810 08/26/17 1002  BP: (!) 145/86 140/72  Pulse: 79 68  Resp:  18  Temp: (!) 36.3 C (!) 36.1 C  SpO2:  97%    Last Pain:  Vitals:   08/26/17 0810  TempSrc: Oral                 Eben Burow

## 2017-08-26 NOTE — H&P (Signed)
All labs reviewed. Abnormal studies sent to patients PCP when indicated.  Previous H&P reviewed, patient examined, there are NO CHANGES.  Nicole Merwin LOUIS1/8/20199:32 AM

## 2017-08-26 NOTE — Anesthesia Preprocedure Evaluation (Signed)
Anesthesia Evaluation  Patient identified by MRN, date of birth, ID band Patient awake    Reviewed: Allergy & Precautions, H&P , NPO status , Patient's Chart, lab work & pertinent test results, reviewed documented beta blocker date and time   History of Anesthesia Complications Negative for: history of anesthetic complications  Airway Mallampati: I  TM Distance: >3 FB Neck ROM: full    Dental  (+) Upper Dentures, Lower Dentures, Dental Advidsory Given   Pulmonary neg shortness of breath, asthma , neg sleep apnea, neg COPD, neg recent URI,           Cardiovascular Exercise Tolerance: Good hypertension, (-) angina+ Past MI  (-) CAD, (-) Cardiac Stents and (-) CABG + dysrhythmias (-) Valvular Problems/Murmurs     Neuro/Psych PSYCHIATRIC DISORDERS negative neurological ROS     GI/Hepatic Neg liver ROS, GERD  ,  Endo/Other  negative endocrine ROS  Renal/GU CRFRenal disease  negative genitourinary   Musculoskeletal   Abdominal   Peds  Hematology  (+) Blood dyscrasia, anemia ,   Anesthesia Other Findings Past Medical History: No date: Anemia No date: Anxiety No date: Arthritis No date: Chronic kidney disease No date: Dysrhythmia     Comment:  gets tachy if anxious or excited No date: GERD (gastroesophageal reflux disease) No date: Hypertension 12/14/2014: IDA (iron deficiency anemia) 1992: Myocardial infarction ([place]) No date: Renal insufficiency   Reproductive/Obstetrics negative OB ROS                             Anesthesia Physical  Anesthesia Plan  ASA: III  Anesthesia Plan: MAC   Post-op Pain Management:    Induction: Intravenous  PONV Risk Score and Plan:   Airway Management Planned: Nasal Cannula  Additional Equipment:   Intra-op Plan:   Post-operative Plan:   Informed Consent: I have reviewed the patients History and Physical, chart, labs and discussed the  procedure including the risks, benefits and alternatives for the proposed anesthesia with the patient or authorized representative who has indicated his/her understanding and acceptance.   Dental Advisory Given  Plan Discussed with: Anesthesiologist, CRNA and Surgeon  Anesthesia Plan Comments:         Anesthesia Quick Evaluation

## 2017-08-26 NOTE — Discharge Instructions (Signed)
Eye Surgery Discharge Instructions  Expect mild scratchy sensation or mild soreness. DO NOT RUB YOUR EYE!  The day of surgery:  Minimal physical activity, but bed rest is not required  No reading, computer work, or close hand work  No bending, lifting, or straining.  May watch TV  For 24 hours:  No driving, legal decisions, or alcoholic beverages  Safety precautions  Eat anything you prefer: It is better to start with liquids, then soup then solid foods.  _____ Eye patch should be worn until postoperative exam tomorrow.  ____ Solar shield eyeglasses should be worn for comfort in the sunlight/patch while sleeping  Resume all regular medications including aspirin or Coumadin if these were discontinued prior to surgery. You may shower, bathe, shave, or wash your hair. Tylenol may be taken for mild discomfort.  Call your doctor if you experience significant pain, nausea, or vomiting, fever > 101 or other signs of infection. [phone] or [phone] Specific instructions:  Follow-up Information    Birder Robson, MD Follow up on 08/27/2017.   Specialty:  Ophthalmology Why:  at 10:05 am Contact information: [place] [address] [phone]

## 2017-08-26 NOTE — Anesthesia Post-op Follow-up Note (Signed)
Anesthesia QCDR form completed.        

## 2017-08-26 NOTE — Transfer of Care (Signed)
Immediate Anesthesia Transfer of Care Note  Patient: Nicole Bishop  Procedure(s) Performed: CATARACT EXTRACTION PHACO AND INTRAOCULAR LENS PLACEMENT (IOC) (Right Eye)  Patient Location: PACU and Short Stay  Anesthesia Type:MAC  Level of Consciousness: awake, alert , oriented and patient cooperative  Airway & Oxygen Therapy: Patient Spontanous Breathing  Post-op Assessment: Report given to RN and Post -op Vital signs reviewed and stable  Post vital signs: Reviewed and stable  Last Vitals:  Vitals:   08/26/17 0810  BP: (!) 145/86  Pulse: 79  Temp: (!) 36.3 C    Last Pain:  Vitals:   08/26/17 0810  TempSrc: Oral         Complications: No apparent anesthesia complications

## 2017-09-02 ENCOUNTER — Telehealth: Payer: Self-pay

## 2017-09-02 NOTE — Telephone Encounter (Signed)
pt has appt on  09-23-17

## 2017-09-02 NOTE — Telephone Encounter (Signed)
Ok

## 2017-09-08 ENCOUNTER — Ambulatory Visit: Payer: Self-pay | Admitting: Internal Medicine

## 2017-09-25 ENCOUNTER — Encounter: Payer: Self-pay | Admitting: Psychiatry

## 2017-09-25 ENCOUNTER — Ambulatory Visit (INDEPENDENT_AMBULATORY_CARE_PROVIDER_SITE_OTHER): Payer: Medicare HMO | Admitting: Psychiatry

## 2017-09-25 ENCOUNTER — Other Ambulatory Visit: Payer: Self-pay

## 2017-09-25 VITALS — BP 137/73 | HR 78 | Temp 99.2°F

## 2017-09-25 DIAGNOSIS — F29 Unspecified psychosis not due to a substance or known physiological condition: Secondary | ICD-10-CM | POA: Diagnosis not present

## 2017-09-25 DIAGNOSIS — F331 Major depressive disorder, recurrent, moderate: Secondary | ICD-10-CM

## 2017-09-25 DIAGNOSIS — Z961 Presence of intraocular lens: Secondary | ICD-10-CM | POA: Diagnosis not present

## 2017-09-25 NOTE — Progress Notes (Signed)
Nicole Bishop Progress Note  09/25/2017 12:05 PM Nicole Bishop  MRN:  [id number]  Chief Complaint: ' I have side effects to medications.'  Chief Complaint    Follow-up; Medication Refill     HPI: Nicole Bishop is an 84 year old African-American female who is widowed, lives in [place], has a history of depression as well as medical problems like osteoarthritis, hypertension, GERD, history of abdominal aortic aneurysm, vitamin B12 deficiency, vitamin D, iron deficiency, presented to the clinic today for a follow-up visit.  Nicole Bishop today presented along with her daughter Nicole Bishop.  Nicole Bishop today presented as wheelchair-bound. Nicole Bishop as well as Nicole Bishop provided information during the evaluation.  [name] today reports that her psychosis has improved since the last visit.  She reports she continues to have some VH . She sees people walking in and out of her room .  She however reports that they seem far away and she is able to cope with it or ignore it.  She reports she did not have another incident when it became too frustrating for her and she had to call 911.  She does continue to be compliant on her risperidone and Lexapro.  She denies any significant depressive symptoms.  She reports sleep is okay.  She however reports when she takes the trazodone she feels lethargic and drowsy throughout the day.  Nicole Bishop also agrees with the same.  Patient as well as daughter wants the trazodone to be discontinued.  She does not want any other medication for sleep at this time.  She reports the risperidone as helpful.  She continues to have some dry mouth from the risperidone.  Discussed ways to cope with her dry mouth like using Biotene mouthwash or lozenges as well as drinking water.  Patient continues to live alone.  Her daughter helps with her appointments as well as helps to keep all her medications in a pillbox.  Patient however continues to do things on her own.  Even though she presents in a  Wheelchair, patient is able  to walk around her house with support and does manage her meals and other things on her own.  Per Nicole Bishop patient has an appointment with her neurologist tomorrow.  Discussed with patient as well as daughter to release records from neurology after it is completed. Visit Diagnosis:    ICD-10-CM   1. MDD (major depressive disorder), recurrent episode, moderate (HCC) F33.1   2. Psychosis, unspecified psychosis type ([place]) F29     Past Psychiatric History: Hx of depression since the past several months.  She denies any history of suicide attempts.  She denies any past inpatient admissions for mental health problems.  Past Medical History:  Past Medical History:  Diagnosis Date  . Anemia   . Anxiety   . Arthritis   . Chronic kidney disease   . Coronary artery disease   . Depression   . Dizziness   . Dyspnea   . Dysrhythmia    gets tachy if anxious or excited  . GERD (gastroesophageal reflux disease)   . History of hiatal hernia   . HOH (hard of hearing)   . Hypertension   . Hypothyroidism   . IDA (iron deficiency anemia) 12/14/2014  . Myocardial infarction ([place]) 1992  . Renal insufficiency   . Sleep apnea   . Tremors of nervous system     Past Surgical History:  Procedure Laterality Date  . CATARACT EXTRACTION W/PHACO Left 07/15/2017   Procedure: CATARACT EXTRACTION PHACO AND INTRAOCULAR LENS PLACEMENT (  IOC)-LEFT;  Surgeon: Birder Robson, MD;  Location: [facility] ORS;  Service: Ophthalmology;  Laterality: Left;  [place] 00.36.1 AP% 13.9 CDE 5.02 Fluid Pack lot # [id number]  . CATARACT EXTRACTION W/PHACO Right 08/26/2017   Procedure: CATARACT EXTRACTION PHACO AND INTRAOCULAR LENS PLACEMENT (IOC);  Surgeon: Birder Robson, MD;  Location: [facility] ORS;  Service: Ophthalmology;  Laterality: Right;  [place] 00:41.0 AP% 15.3 CDE 6.27 FLUID PACK LOT # [id number]  . CHOLECYSTECTOMY    . COLONOSCOPY WITH PROPOFOL N/A 04/10/2015   Procedure: COLONOSCOPY WITH PROPOFOL;  Surgeon: Manya Silvas, MD;   Location: [facility] ENDOSCOPY;  Service: Endoscopy;  Laterality: N/A;  . ESOPHAGOGASTRODUODENOSCOPY (EGD) WITH PROPOFOL N/A 04/10/2015   Procedure: ESOPHAGOGASTRODUODENOSCOPY (EGD) WITH PROPOFOL;  Surgeon: Manya Silvas, MD;  Location: [facility] ENDOSCOPY;  Service: Endoscopy;  Laterality: N/A;  . fatty tumor removed on left shoulder    . gallbladdedr removed    . JOINT REPLACEMENT Bilateral    total knee replacements  . REPLACEMENT TOTAL KNEE BILATERAL Bilateral     Family Psychiatric History:Denies history of mental health problems in the family.  Father used to be an alcoholic.  She denies any suicide in her family.  Family History:  Family History  Problem Relation Age of Onset  . Arthritis Mother   . Hypertension Mother   . Stroke Mother   . Hypertension Father   . Heart attack Father   . Breast cancer Neg Hx    Substance abuse history: Denies  Social History: Patient is widowed.  Patient lives in [place].  Patient lives alone at this time.  Patient does have support from her daughter Nicole Bishop'.  Patient has 8 living children at this time.  Patient went up to 11th grade.  Patient used to work in the past, domestic type of work. Social History   Socioeconomic History  . Marital status: Widowed    Spouse name: None  . Number of children: 9  . Years of education: None  . Highest education level: 11th grade  Social Needs  . Financial resource strain: None  . Food insecurity - worry: None  . Food insecurity - inability: None  . Transportation needs - medical: None  . Transportation needs - non-medical: None  Occupational History    Comment: retired  Tobacco Use  . Smoking status: Never Smoker  . Smokeless tobacco: Never Used  Substance and Sexual Activity  . Alcohol use: No  . Drug use: No  . Sexual activity: No  Other Topics Concern  . None  Social History Narrative  . None    Allergies:  Allergies  Allergen Reactions  . Penicillins Rash and Other (See Comments)     Has patient had a PCN reaction causing immediate rash, facial/tongue/throat swelling, SOB or lightheadedness with hypotension: Unknown Has patient had a PCN reaction causing severe rash involving mucus membranes or skin necrosis: Unknown Has patient had a PCN reaction that required hospitalization: Unknown Has patient had a PCN reaction occurring within the last 10 years: Unknown If all of the above answers are "NO", then may proceed with Cephalosporin use.   . Nsaids Other (See Comments)    GI upset  . Atorvastatin Rash  . Celebrex [Celecoxib] Other (See Comments)    Constipation and stomach upset  . Felodipine Nausea Only  . Oxaprozin Nausea Only  . Rofecoxib Other (See Comments)    GI upset  . Simvastatin Rash    Metabolic Disorder Labs: No results found for: HGBA1C, MPG No results found  for: PROLACTIN Lab Results  Component Value Date   CHOL 212 (H) 05/21/2011   TRIG 112.0 05/21/2011   HDL 43.60 05/21/2011   CHOLHDL 5 05/21/2011   VLDL 22.4 05/21/2011   Lab Results  Component Value Date   TSH 2.268 07/31/2017    Therapeutic Level Labs: No results found for: LITHIUM No results found for: VALPROATE No components found for:  CBMZ  Current Medications: Current Outpatient Medications  Medication Sig Dispense Refill  . amLODipine (NORVASC) 5 MG tablet Take 5 mg by mouth daily.     Marland Kitchen aspirin 81 MG tablet Take 81 mg by mouth every other day.      . Cholecalciferol (VITAMIN D3) 2000 units capsule Take 4,000 Units by mouth daily.    . cyanocobalamin (,VITAMIN B-12,) 1000 MCG/ML injection Inject into the muscle.     . docusate sodium (COLACE) 100 MG capsule Take 100 mg by mouth daily.     Marland Kitchen escitalopram (LEXAPRO) 20 MG tablet Take 1 tablet (20 mg total) by mouth daily. 90 tablet 1  . esomeprazole (NEXIUM) 40 MG capsule Take 40 mg by mouth daily as needed (acid reflux).     . Ferrous Sulfate (SLOW FE) 142 (45 Fe) MG TBCR Take 1 tablet by mouth daily.    Marland Kitchen labetalol  (NORMODYNE) 100 MG tablet Take 0.5 tablets (50 mg total) by mouth at bedtime. 15 tablet 0  . loratadine (CLARITIN) 10 MG tablet Take 1 tablet (10 mg total) by mouth daily. 30 tablet 0  . losartan (COZAAR) 50 MG tablet Take 25 mg by mouth daily.     . mometasone (NASONEX) 50 MCG/ACT nasal spray Place 2 sprays into the nose daily.      Marland Kitchen olopatadine (PATANOL) 0.1 % ophthalmic solution Place 1 drop into both eyes 2 (two) times daily. At 6-8 hours intervals     . polyethylene glycol (MIRALAX / GLYCOLAX) packet Take 17 g by mouth daily as needed for mild constipation or moderate constipation.    . risperiDONE (RISPERDAL) 1 MG tablet Take 1 tablet (1 mg total) by mouth at bedtime. 30 tablet 2  . traMADol-acetaminophen (ULTRACET) 37.5-325 MG tablet Take 1 tablet by mouth daily as needed for moderate pain.     . vitamin B-12 (CYANOCOBALAMIN) 1000 MCG tablet Take 2,000 mcg by mouth daily.     No current facility-administered medications for this visit.      Musculoskeletal: Strength & Muscle Tone: within normal limits Gait & Station: in wheelcahir Patient leans: N/A  Psychiatric Specialty Exam: Review of Systems  Musculoskeletal:       Has pedal edema   Psychiatric/Behavioral: The patient is nervous/anxious.   All other systems reviewed and are negative.   Blood pressure 137/73, pulse 78, temperature 99.2 F (37.3 C), temperature source Oral.There is no height or weight on file to calculate BMI.  General Appearance: Casual  Eye Contact:  Fair  Speech:  Normal Rate  Volume:  Normal  Mood:  Anxious  Affect:  Appropriate  Thought Process:  Goal Directed and Descriptions of Associations: Intact  Orientation:  Full (Time, Place, and Person)  Thought Content: Logical   Suicidal Thoughts:  No  Homicidal Thoughts:  No  Memory:  Immediate;   Fair Recent;   Fair Remote;   Fair  Judgement:  Fair  Insight:  Fair  Psychomotor Activity:  Normal  Concentration:  Concentration: Fair and Attention  Span: Fair  Recall:  [employer] of Knowledge: Fair  Language: Fair  Akathisia:  No  Handed:  Right  AIMS (if indicated): 0  Assets:  Communication Skills Desire for Improvement Social Support  ADL's:  Intact  Cognition: WNL  Sleep:  Fair   Screenings: AIMS     Office Visit from 08/14/2017 in [place] Total Score  0    PHQ2-9     Nutrition from 05/25/2015 in [place]  PHQ-2 Total Score  1       Assessment and Plan: Kristin is an 84 year old African-American female who has a history of depression as well as multiple medical problems as well as recent cataract surgery, knee surgery, presented to the clinic today for a follow-up visit.  Delancey presents as wheelchair-bound.  Jeneen presented along with her daughter Nicole Bishop who assisted with providing collateral information.  Fleeta continues to have some visual hallucinations of seeing people in her house however she has been able to cope with it better.  Patient discussed concerns with her medication which were readjusted during this visit.  Plan as noted below.  Plan Depression Continue Lexapro 20 mg p.o. daily Continue risperidone 1 mg p.o. nightly [place] equals 0  For psychosis Unknown if psychosis is due to her depression. Continue risperidone 1 mg p.o. Nightly  Pt has been referred for neurology evaluation.  Patient had an MMSE done on 07/31/2017-scored 25 out of 30.  She also appears to be oriented to person, place, situation, day and time as well as her immediate and recent memory seems to be intact.  However since she does have new onset psychosis she may need CT scan of her brain and hence a referral to neurology has been made.  For insomnia She reports risperidone 1 mg helps with sleep. Discontinue trazodone for side effects.  For her pedal edema-patient will follow up with her primary medical doctor.  Follow up in clinic in 2 months or sooner if  needed.  More than 50 % of the time was spent for psychoeducation and supportive psychotherapy and care coordination.  This note was generated in part or whole with voice recognition software. Voice recognition is usually quite accurate but there are transcription errors that can and very often do occur. I apologize for any typographical errors that were not detected and corrected.       Ursula Alert, MD 09/25/2017, 12:05 PM

## 2017-09-29 DIAGNOSIS — F03A0 Unspecified dementia, mild, without behavioral disturbance, psychotic disturbance, mood disturbance, and anxiety: Secondary | ICD-10-CM | POA: Insufficient documentation

## 2017-09-29 DIAGNOSIS — F0390 Unspecified dementia without behavioral disturbance: Secondary | ICD-10-CM | POA: Insufficient documentation

## 2017-09-29 DIAGNOSIS — G4752 REM sleep behavior disorder: Secondary | ICD-10-CM | POA: Diagnosis not present

## 2017-10-16 ENCOUNTER — Other Ambulatory Visit: Payer: Self-pay

## 2017-10-16 ENCOUNTER — Emergency Department: Payer: Medicare HMO

## 2017-10-16 ENCOUNTER — Emergency Department
Admission: EM | Admit: 2017-10-16 | Discharge: 2017-10-16 | Disposition: A | Discharge status: Home / Self Care | Payer: Medicare HMO | Attending: Student in an Organized Health Care Education/Training Program | Admitting: Student in an Organized Health Care Education/Training Program

## 2017-10-16 ENCOUNTER — Encounter: Payer: Self-pay | Admitting: Emergency Medicine

## 2017-10-16 DIAGNOSIS — I129 Hypertensive chronic kidney disease with stage 1 through stage 4 chronic kidney disease, or unspecified chronic kidney disease: Secondary | ICD-10-CM | POA: Insufficient documentation

## 2017-10-16 DIAGNOSIS — E039 Hypothyroidism, unspecified: Secondary | ICD-10-CM | POA: Insufficient documentation

## 2017-10-16 DIAGNOSIS — Z7982 Long term (current) use of aspirin: Secondary | ICD-10-CM | POA: Diagnosis not present

## 2017-10-16 DIAGNOSIS — N3000 Acute cystitis without hematuria: Secondary | ICD-10-CM | POA: Diagnosis not present

## 2017-10-16 DIAGNOSIS — Z79899 Other long term (current) drug therapy: Secondary | ICD-10-CM | POA: Insufficient documentation

## 2017-10-16 DIAGNOSIS — R531 Weakness: Secondary | ICD-10-CM | POA: Diagnosis present

## 2017-10-16 DIAGNOSIS — I252 Old myocardial infarction: Secondary | ICD-10-CM | POA: Insufficient documentation

## 2017-10-16 DIAGNOSIS — I4891 Unspecified atrial fibrillation: Secondary | ICD-10-CM | POA: Diagnosis not present

## 2017-10-16 DIAGNOSIS — I10 Essential (primary) hypertension: Secondary | ICD-10-CM | POA: Diagnosis not present

## 2017-10-16 DIAGNOSIS — I2510 Atherosclerotic heart disease of native coronary artery without angina pectoris: Secondary | ICD-10-CM | POA: Diagnosis not present

## 2017-10-16 DIAGNOSIS — N183 Chronic kidney disease, stage 3 (moderate): Secondary | ICD-10-CM | POA: Insufficient documentation

## 2017-10-16 DIAGNOSIS — J9811 Atelectasis: Secondary | ICD-10-CM | POA: Diagnosis not present

## 2017-10-16 LAB — URINALYSIS, COMPLETE (UACMP) WITH MICROSCOPIC
BILIRUBIN URINE: NEGATIVE
GLUCOSE, UA: NEGATIVE mg/dL
HGB URINE DIPSTICK: NEGATIVE
Ketones, ur: NEGATIVE mg/dL
NITRITE: NEGATIVE
PROTEIN: NEGATIVE mg/dL
Specific Gravity, Urine: 1.025 (ref 1.005–1.030)
pH: 5 (ref 5.0–8.0)

## 2017-10-16 LAB — BASIC METABOLIC PANEL
ANION GAP: 9 (ref 5–15)
BUN: 25 mg/dL — AB (ref 6–20)
CHLORIDE: 110 mmol/L (ref 101–111)
CO2: 20 mmol/L — ABNORMAL LOW (ref 22–32)
Calcium: 8.9 mg/dL (ref 8.9–10.3)
Creatinine, Ser: 1.4 mg/dL — ABNORMAL HIGH (ref 0.44–1.00)
GFR calc Af Amer: 39 mL/min — ABNORMAL LOW (ref >60)
GFR, EST NON AFRICAN AMERICAN: 33 mL/min — AB (ref >60)
GLUCOSE: 101 mg/dL — AB (ref 65–99)
POTASSIUM: 3.4 mmol/L — AB (ref 3.5–5.1)
Sodium: 139 mmol/L (ref 135–145)

## 2017-10-16 LAB — CBC
HCT: 34.3 % — ABNORMAL LOW (ref 35.0–47.0)
HEMOGLOBIN: 11.5 g/dL — AB (ref 12.0–16.0)
MCH: 27.9 pg (ref 26.0–34.0)
MCHC: 33.5 g/dL (ref 32.0–36.0)
MCV: 83.3 fL (ref 80.0–100.0)
Platelets: 185 10*3/uL (ref 150–440)
RBC: 4.12 MIL/uL (ref 3.80–5.20)
RDW: 15.2 % — AB (ref 11.5–14.5)
WBC: 5 10*3/uL (ref 3.6–11.0)

## 2017-10-16 LAB — TROPONIN I

## 2017-10-16 MED ORDER — POTASSIUM CHLORIDE CRYS ER 20 MEQ PO TBCR
30.0000 meq | EXTENDED_RELEASE_TABLET | Freq: Once | ORAL | Status: AC
  Administered 2017-10-16: 30 meq via ORAL
  Filled 2017-10-16: qty 2

## 2017-10-16 MED ORDER — APIXABAN 5 MG PO TABS
5.0000 mg | ORAL_TABLET | Freq: Two times a day (BID) | ORAL | Status: DC
  Administered 2017-10-16: 5 mg via ORAL

## 2017-10-16 MED ORDER — MAGNESIUM SULFATE 2 GM/50ML IV SOLN
2.0000 g | Freq: Once | INTRAVENOUS | Status: AC
  Administered 2017-10-16: 2 g via INTRAVENOUS
  Filled 2017-10-16: qty 50

## 2017-10-16 MED ORDER — APIXABAN 5 MG PO TABS
ORAL_TABLET | ORAL | Status: AC
  Administered 2017-10-16: 5 mg via ORAL
  Filled 2017-10-16: qty 1

## 2017-10-16 MED ORDER — SODIUM CHLORIDE 0.9 % IV BOLUS (SEPSIS)
500.0000 mL | Freq: Once | INTRAVENOUS | Status: AC
  Administered 2017-10-16: 500 mL via INTRAVENOUS

## 2017-10-16 MED ORDER — APIXABAN 5 MG PO TABS: 5.0000 mg | ORAL_TABLET | Freq: Two times a day (BID) | ORAL | 0 refills | Status: DC

## 2017-10-16 MED ORDER — FOSFOMYCIN TROMETHAMINE 3 G PO PACK
3.0000 g | PACK | Freq: Once | ORAL | Status: AC
  Administered 2017-10-16: 3 g via ORAL
  Filled 2017-10-16: qty 3

## 2017-10-16 NOTE — ED Provider Notes (Signed)
[facility] Emergency Department Provider Note    None    (approximate)  I have reviewed the triage vital signs and the nursing notes.   HISTORY  Chief Complaint Weakness    HPI Nicole Bishop is a 84 y.o. female with history of CKD and CAD presents for "months "of weakness and palpitations.  States that she simply feels fatigued.  Denies any chest pain or shortness of breath.  Patient was noted to have new A. fib on EKG today and was sent to the ER for further evaluation.  Denies any chest pain shortness of breath at this time.  Follows with.  Shows with cardiology.  Is not on any rate control medications found to be rate controlled here.  Family states that she has not been drinking as much as typical.  Past Medical History:  Diagnosis Date  . Anemia   . Anxiety   . Arthritis   . Chronic kidney disease   . Coronary artery disease   . Depression   . Dizziness   . Dyspnea   . Dysrhythmia    gets tachy if anxious or excited  . GERD (gastroesophageal reflux disease)   . History of hiatal hernia   . HOH (hard of hearing)   . Hypertension   . Hypothyroidism   . IDA (iron deficiency anemia) 12/14/2014  . Myocardial infarction ([place]) 1992  . Renal insufficiency   . Sleep apnea   . Tremors of nervous system    Family History  Problem Relation Age of Onset  . Arthritis Mother   . Hypertension Mother   . Stroke Mother   . Hypertension Father   . Heart attack Father   . Breast cancer Neg Hx    Past Surgical History:  Procedure Laterality Date  . CATARACT EXTRACTION W/PHACO Left 07/15/2017   Procedure: CATARACT EXTRACTION PHACO AND INTRAOCULAR LENS PLACEMENT (IOC)-LEFT;  Surgeon: Birder Robson, MD;  Location: [facility] ORS;  Service: Ophthalmology;  Laterality: Left;  [place] 00.36.1 AP% 13.9 CDE 5.02 Fluid Pack lot # [id number]  . CATARACT EXTRACTION W/PHACO Right 08/26/2017   Procedure: CATARACT EXTRACTION PHACO AND INTRAOCULAR LENS PLACEMENT (IOC);   Surgeon: Birder Robson, MD;  Location: [facility] ORS;  Service: Ophthalmology;  Laterality: Right;  [place] 00:41.0 AP% 15.3 CDE 6.27 FLUID PACK LOT # [id number]  . CHOLECYSTECTOMY    . COLONOSCOPY WITH PROPOFOL N/A 04/10/2015   Procedure: COLONOSCOPY WITH PROPOFOL;  Surgeon: Manya Silvas, MD;  [facility] ENDOSCOPY;  Service: Endoscopy;  Laterality: N/A;  . ESOPHAGOGASTRODUODENOSCOPY (EGD) WITH PROPOFOL N/A 04/10/2015   Procedure: ESOPHAGOGASTRODUODENOSCOPY (EGD) WITH PROPOFOL;  Surgeon: Manya Silvas, MD;  Location: [facility] ENDOSCOPY;  Service: Endoscopy;  Laterality: N/A;  . fatty tumor removed on left shoulder    . gallbladdedr removed    . JOINT REPLACEMENT Bilateral    total knee replacements  . REPLACEMENT TOTAL KNEE BILATERAL Bilateral    Patient Active Problem List   Diagnosis Date Noted  . Visual hallucinations 07/16/2017  . SOB (shortness of breath) on exertion 10/30/2016  . Depression, major, single episode, complete remission ([place]) 08/03/2016  . Kidney failure 03/17/2015  . Chronic kidney disease (CKD), stage III (moderate) ([place]) 12/22/2014  . Anemia, iron deficiency 12/22/2014  . IDA (iron deficiency anemia) 12/14/2014  . Chronic constipation 06/03/2014  . Essential (primary) hypertension 05/03/2014  . Acid reflux 04/20/2014  . H/O: obesity 04/20/2014  . Airway hyperreactivity 04/20/2014  . Arthritis of shoulder region, degenerative 01/27/2014  .  B12 deficiency 12/27/2013  . Dizziness 03/13/2012  . Back ache 11/04/2011  . Avitaminosis D 11/04/2011  . Combined fat and carbohydrate induced hyperlipemia 10/31/2011  . Absolute anemia 10/31/2011  . Hypertension 05/21/2011  . Depression 05/21/2011  . Hearing loss 05/21/2011      Prior to Admission medications   Medication Sig Start Date End Date Taking? Authorizing Provider  amLODipine (NORVASC) 5 MG tablet Take 5 mg by mouth daily.  03/27/15   [provider]  aspirin 81 MG tablet Take 81 mg by mouth every  other day.      [provider]  Cholecalciferol (VITAMIN D3) 2000 units capsule Take 4,000 Units by mouth daily.    [provider]  cyanocobalamin (,VITAMIN B-12,) 1000 MCG/ML injection Inject into the muscle.  12/27/13   [provider]  docusate sodium (COLACE) 100 MG capsule Take 100 mg by mouth daily.  06/03/14   [provider]  escitalopram (LEXAPRO) 20 MG tablet Take 1 tablet (20 mg total) by mouth daily. 07/31/17   Ursula Alert, MD  esomeprazole (NEXIUM) 40 MG capsule Take 40 mg by mouth daily as needed (acid reflux).     [provider]  Ferrous Sulfate (SLOW FE) 142 (45 Fe) MG TBCR Take 1 tablet by mouth daily.    [provider]  labetalol (NORMODYNE) 100 MG tablet Take 0.5 tablets (50 mg total) by mouth at bedtime. 07/22/12   Jackolyn Confer, MD  loratadine (CLARITIN) 10 MG tablet Take 1 tablet (10 mg total) by mouth daily. 02/12/13   Jackolyn Confer, MD  losartan (COZAAR) 50 MG tablet Take 25 mg by mouth daily.  05/01/15   [provider]  mometasone (NASONEX) 50 MCG/ACT nasal spray Place 2 sprays into the nose daily.      [provider]  olopatadine (PATANOL) 0.1 % ophthalmic solution Place 1 drop into both eyes 2 (two) times daily. At 6-8 hours intervals     [provider]  polyethylene glycol (MIRALAX / GLYCOLAX) packet Take 17 g by mouth daily as needed for mild constipation or moderate constipation.    [provider]  risperiDONE (RISPERDAL) 1 MG tablet Take 1 tablet (1 mg total) by mouth at bedtime. 08/14/17   Ursula Alert, MD  traMADol-acetaminophen (ULTRACET) 37.5-325 MG tablet Take 1 tablet by mouth daily as needed for moderate pain.  05/01/15   [provider]  vitamin B-12 (CYANOCOBALAMIN) 1000 MCG tablet Take 2,000 mcg by mouth daily.    [provider]    Allergies Penicillins; Nsaids; Atorvastatin; Celebrex [celecoxib]; Felodipine; Oxaprozin; Rofecoxib;  and Simvastatin    Social History Social History   Tobacco Use  . Smoking status: Never Smoker  . Smokeless tobacco: Never Used  Substance Use Topics  . Alcohol use: No  . Drug use: No    Review of Systems Patient denies headaches, rhinorrhea, blurry vision, numbness, shortness of breath, chest pain, edema, cough, abdominal pain, nausea, vomiting, diarrhea, dysuria, fevers, rashes or hallucinations unless otherwise stated above in HPI. ____________________________________________   PHYSICAL EXAM:  VITAL SIGNS: Vitals:   10/16/17 1210  BP: 124/72  Pulse: 93  Resp: 18  Temp: 97.7 F (36.5 C)  SpO2: 94%    Constitutional: Alert and oriented. Well appearing and in no acute distress. Eyes: Conjunctivae are normal.  Head: Atraumatic. Nose: No congestion/rhinnorhea. Mouth/Throat: Mucous membranes are moist.   Neck: No stridor. Painless ROM.  Cardiovascular: irregularly irregular rhythm. Grossly normal heart sounds.  Good  peripheral circulation. Respiratory: Normal respiratory effort.  No retractions. Lungs CTAB. Gastrointestinal: Soft and nontender. No distention. No abdominal bruits. No CVA tenderness. Genitourinary:  Musculoskeletal: No lower extremity tenderness nor edema.  No joint effusions. Neurologic:  Normal speech and language. No gross focal neurologic deficits are appreciated. No facial droop Skin:  Skin is warm, dry and intact. No rash noted. Psychiatric: Mood and affect are normal. Speech and behavior are normal.  ____________________________________________   LABS (all labs ordered are listed, but only abnormal results are displayed)  Results for orders placed or performed during the hospital encounter of 10/16/17 (from the past 24 hour(s))  Basic metabolic panel     Status: Abnormal   Collection Time: 10/16/17 12:12 PM  Result Value Ref Range   Sodium 139 135 - 145 mmol/L   Potassium 3.4 (L) 3.5 - 5.1 mmol/L   Chloride 110 101 - 111 mmol/L   CO2 20  (L) 22 - 32 mmol/L   Glucose, Bld 101 (H) 65 - 99 mg/dL   BUN 25 (H) 6 - 20 mg/dL   Creatinine, Ser 1.40 (H) 0.44 - 1.00 mg/dL   Calcium 8.9 8.9 - 10.3 mg/dL   GFR calc non Af Amer 33 (L) >60 mL/min   GFR calc Af Amer 39 (L) >60 mL/min   Anion gap 9 5 - 15  CBC     Status: Abnormal   Collection Time: 10/16/17 12:12 PM  Result Value Ref Range   WBC 5.0 3.6 - 11.0 K/uL   RBC 4.12 3.80 - 5.20 MIL/uL   Hemoglobin 11.5 (L) 12.0 - 16.0 g/dL   HCT 34.3 (L) 35.0 - 47.0 %   MCV 83.3 80.0 - 100.0 fL   MCH 27.9 26.0 - 34.0 pg   MCHC 33.5 32.0 - 36.0 g/dL   RDW 15.2 (H) 11.5 - 14.5 %   Platelets 185 150 - 440 K/uL   ____________________________________________  EKG My review and personal interpretation at Time: 12:18   Indication: palpitations  Rate: 105  Rhythm: afib Axis: normal Other: nonsepcific st abnormality, no stemi ____________________________________________  RADIOLOGY  I personally reviewed all radiographic images ordered to evaluate for the above acute complaints and reviewed radiology reports and findings.  These findings were personally discussed with the patient.  Please see medical record for radiology report.  ____________________________________________   PROCEDURES  Procedure(s) performed:  Procedures    Critical Care performed: no ____________________________________________   INITIAL IMPRESSION / ASSESSMENT AND PLAN / ED COURSE  Pertinent labs & imaging results that were available during my care of the patient were reviewed by me and considered in my medical decision making (see chart for details).  DDX: afib, dehydration, acs, uti, electrolyte abn  CHETARA KROPP is a 84 y.o. who presents to the ED with A. fib.  Has had several months of weakness.  She is otherwise well-appearing and in no acute distress.  A. fib is rate controlled.  Will give fluids as she does appear slightly dehydrated and will give IV magnesium serial enzyme check blood work and  monitor patient.  Clinical Course as of Oct 16 2242  Thu Oct 16, 2017  1728 CHADs vasc of 4  [PR]  1735 After discussion with Dr. Saralyn Pilar who has requested patient be started on Eliquis 5 twice daily given her chads vas score we will plan for follow-up in clinic for further medical management.  [PR]    Clinical Course User Index [PR] Merlyn Lot, MD  ____________________________________________   FINAL CLINICAL IMPRESSION(S) / ED DIAGNOSES  Final diagnoses:  Atrial fibrillation with controlled ventricular rate ([place])  Acute cystitis without hematuria      NEW MEDICATIONS STARTED DURING THIS VISIT:  New Prescriptions   No medications on file     Note:  This document was prepared using Dragon voice recognition software and may include unintentional dictation errors.    Merlyn Lot, MD 10/16/17 2245

## 2017-10-16 NOTE — Discharge Instructions (Addendum)
Please follow-up with cardiology as soon as possible.  Cardiologist has recommended that you start taking 5 mg Eliquis twice daily.  This medication within your blood and reduce your risk of stroke.  You should know however, that if you were to fall and hit to head or have any bleeding he should seek medical attention immediately.

## 2017-10-16 NOTE — ED Triage Notes (Signed)
Pt states weakness for the past few weeks, was sent here from [facility] clinic with irregular heart beat per daughter, pt does not have hx of afib per daughter. Pt appears in no distress.

## 2017-10-27 DIAGNOSIS — F0390 Unspecified dementia without behavioral disturbance: Secondary | ICD-10-CM | POA: Diagnosis not present

## 2017-10-27 DIAGNOSIS — G4752 REM sleep behavior disorder: Secondary | ICD-10-CM | POA: Diagnosis not present

## 2017-10-29 DIAGNOSIS — I10 Essential (primary) hypertension: Secondary | ICD-10-CM | POA: Diagnosis not present

## 2017-10-29 DIAGNOSIS — I5032 Chronic diastolic (congestive) heart failure: Secondary | ICD-10-CM | POA: Insufficient documentation

## 2017-10-29 DIAGNOSIS — R0602 Shortness of breath: Secondary | ICD-10-CM | POA: Diagnosis not present

## 2017-10-29 DIAGNOSIS — E782 Mixed hyperlipidemia: Secondary | ICD-10-CM | POA: Diagnosis not present

## 2017-10-29 DIAGNOSIS — I4891 Unspecified atrial fibrillation: Secondary | ICD-10-CM | POA: Diagnosis not present

## 2017-10-29 DIAGNOSIS — N183 Chronic kidney disease, stage 3 (moderate): Secondary | ICD-10-CM | POA: Diagnosis not present

## 2017-11-05 DIAGNOSIS — I491 Atrial premature depolarization: Secondary | ICD-10-CM | POA: Diagnosis not present

## 2017-11-10 ENCOUNTER — Other Ambulatory Visit: Payer: Self-pay | Admitting: Urgent Care

## 2017-11-10 ENCOUNTER — Inpatient Hospital Stay: Payer: Medicare HMO | Attending: Hematology and Oncology

## 2017-11-10 DIAGNOSIS — N183 Chronic kidney disease, stage 3 unspecified: Secondary | ICD-10-CM

## 2017-11-10 DIAGNOSIS — D509 Iron deficiency anemia, unspecified: Secondary | ICD-10-CM

## 2017-11-10 DIAGNOSIS — E538 Deficiency of other specified B group vitamins: Secondary | ICD-10-CM | POA: Insufficient documentation

## 2017-11-10 DIAGNOSIS — R531 Weakness: Secondary | ICD-10-CM | POA: Diagnosis not present

## 2017-11-10 DIAGNOSIS — I252 Old myocardial infarction: Secondary | ICD-10-CM | POA: Diagnosis not present

## 2017-11-10 DIAGNOSIS — I2510 Atherosclerotic heart disease of native coronary artery without angina pectoris: Secondary | ICD-10-CM | POA: Insufficient documentation

## 2017-11-10 DIAGNOSIS — I129 Hypertensive chronic kidney disease with stage 1 through stage 4 chronic kidney disease, or unspecified chronic kidney disease: Secondary | ICD-10-CM | POA: Diagnosis not present

## 2017-11-10 DIAGNOSIS — E039 Hypothyroidism, unspecified: Secondary | ICD-10-CM | POA: Diagnosis not present

## 2017-11-10 LAB — COMPREHENSIVE METABOLIC PANEL
ALT: 10 U/L — AB (ref 14–54)
AST: 16 U/L (ref 15–41)
Albumin: 3.9 g/dL (ref 3.5–5.0)
Alkaline Phosphatase: 64 U/L (ref 38–126)
Anion gap: 11 (ref 5–15)
BUN: 28 mg/dL — ABNORMAL HIGH (ref 6–20)
CO2: 23 mmol/L (ref 22–32)
CREATININE: 1.56 mg/dL — AB (ref 0.44–1.00)
Calcium: 9.3 mg/dL (ref 8.9–10.3)
Chloride: 102 mmol/L (ref 101–111)
GFR, EST AFRICAN AMERICAN: 34 mL/min — AB (ref >60)
GFR, EST NON AFRICAN AMERICAN: 29 mL/min — AB (ref >60)
Glucose, Bld: 100 mg/dL — ABNORMAL HIGH (ref 65–99)
Potassium: 4 mmol/L (ref 3.5–5.1)
Sodium: 136 mmol/L (ref 135–145)
Total Bilirubin: 0.7 mg/dL (ref 0.3–1.2)
Total Protein: 7.2 g/dL (ref 6.5–8.1)

## 2017-11-10 LAB — CBC WITH DIFFERENTIAL/PLATELET
Basophils Absolute: 0 10*3/uL (ref 0–0.1)
Basophils Relative: 1 %
EOS ABS: 0.1 10*3/uL (ref 0–0.7)
EOS PCT: 2 %
HCT: 36 % (ref 35.0–47.0)
Hemoglobin: 11.9 g/dL — ABNORMAL LOW (ref 12.0–16.0)
Lymphocytes Relative: 36 %
Lymphs Abs: 1.4 10*3/uL (ref 1.0–3.6)
MCH: 27.9 pg (ref 26.0–34.0)
MCHC: 33 g/dL (ref 32.0–36.0)
MCV: 84.7 fL (ref 80.0–100.0)
Monocytes Absolute: 0.4 10*3/uL (ref 0.2–0.9)
Monocytes Relative: 10 %
Neutro Abs: 2.1 10*3/uL (ref 1.4–6.5)
Neutrophils Relative %: 51 %
PLATELETS: 215 10*3/uL (ref 150–440)
RBC: 4.25 MIL/uL (ref 3.80–5.20)
RDW: 15.2 % — ABNORMAL HIGH (ref 11.5–14.5)
WBC: 4 10*3/uL (ref 3.6–11.0)

## 2017-11-10 LAB — FERRITIN: FERRITIN: 41 ng/mL (ref 11–307)

## 2017-11-11 ENCOUNTER — Encounter: Payer: Self-pay | Admitting: Hematology and Oncology

## 2017-11-11 ENCOUNTER — Inpatient Hospital Stay: Payer: Medicare HMO

## 2017-11-11 ENCOUNTER — Other Ambulatory Visit: Payer: Self-pay

## 2017-11-11 ENCOUNTER — Inpatient Hospital Stay (HOSPITAL_BASED_OUTPATIENT_CLINIC_OR_DEPARTMENT_OTHER): Payer: Medicare HMO | Admitting: Hematology and Oncology

## 2017-11-11 VITALS — BP 113/69 | HR 65 | Temp 96.0°F | Resp 18 | Wt 262.0 lb

## 2017-11-11 DIAGNOSIS — D509 Iron deficiency anemia, unspecified: Secondary | ICD-10-CM | POA: Diagnosis not present

## 2017-11-11 DIAGNOSIS — R531 Weakness: Secondary | ICD-10-CM | POA: Diagnosis not present

## 2017-11-11 DIAGNOSIS — N189 Chronic kidney disease, unspecified: Secondary | ICD-10-CM

## 2017-11-11 DIAGNOSIS — R5383 Other fatigue: Secondary | ICD-10-CM | POA: Diagnosis not present

## 2017-11-11 DIAGNOSIS — E039 Hypothyroidism, unspecified: Secondary | ICD-10-CM | POA: Diagnosis not present

## 2017-11-11 DIAGNOSIS — E538 Deficiency of other specified B group vitamins: Secondary | ICD-10-CM | POA: Diagnosis not present

## 2017-11-11 DIAGNOSIS — N183 Chronic kidney disease, stage 3 (moderate): Secondary | ICD-10-CM | POA: Diagnosis not present

## 2017-11-11 DIAGNOSIS — I252 Old myocardial infarction: Secondary | ICD-10-CM | POA: Diagnosis not present

## 2017-11-11 DIAGNOSIS — I129 Hypertensive chronic kidney disease with stage 1 through stage 4 chronic kidney disease, or unspecified chronic kidney disease: Secondary | ICD-10-CM | POA: Diagnosis not present

## 2017-11-11 DIAGNOSIS — I2510 Atherosclerotic heart disease of native coronary artery without angina pectoris: Secondary | ICD-10-CM | POA: Diagnosis not present

## 2017-11-11 NOTE — Progress Notes (Signed)
Here for follow up. Stated getting" dizzy " intermittently . For f/u w Dr Micah Flesher in April -new dx of A fib.

## 2017-11-11 NOTE — Progress Notes (Signed)
[facility] day:  11/11/17  Chief Complaint: Nicole Bishop is a 84 y.o. female with stage III chronic kidney disease and iron deficiency who is seen for 6 month assessment.  HPI: The patient was last seen in the medical oncology clinic on 05/13/2017.  At that time, she felt "weak and tired".  Exam revealed a varigated skin lesion on her distal right lower extremity.  Hematocrit was 29.8.  Ferritin was 9.  She was referred to dermatology and back to GI.  She received weekly Venofer x 3 (05/23/2018 - 06/06/2018).  CBC on 08/07/2017 revealed a hematocrit of 36.1, hemoglobin 11.6, and MCV 82.2.  Ferritin was 46.  She was sen in the [facility] ER on 10/16/2017 secondary to weakness.  Urinalysis revealed no blood.  She was noted to have new atrial fibrillation.  Patient has been seen in follow up consult by Dr. Josefa Half (cardiology). 24 hour holter study revealed predominant normal sinus rhythm with mean heart rate of 62 bpm. Sinus bradycardia at 49 bpm was noted. Frequent premature atrial contractions with occasional atrial runs longest lasting 8 beats was noted. Infrequent premature ventricular contractions were present. There were no diary entries. Patient is scheduled for follow up with cardiology in the near future.   CBC on 11/10/2017 revealed a hematocrit of 36.0, hemoglobin 11.9, and MCV 84.7.  Ferritin was 41.  Creatinine was 1.56.  Symptomatically, patient is doing well. She has chronic fatigue. Patient denies bleeding; no hematochezia, melena, gross hematuria, or vaginal bleeding. Patient continues to eat meat on a regular basis. She notes that she doesn't cook much any more because she is so tired.  Patient's weight is down 4 pounds. Her daughter states, "She never comes out of the house unless it is to go to the doctor".   Patient denies pain in the clinic today.    Past Medical History:  Diagnosis Date  . Anemia   . Anxiety   . Arthritis   .  Chronic kidney disease   . Coronary artery disease   . Depression   . Dizziness   . Dyspnea   . Dysrhythmia    gets tachy if anxious or excited  . GERD (gastroesophageal reflux disease)   . History of hiatal hernia   . HOH (hard of hearing)   . Hypertension   . Hypothyroidism   . IDA (iron deficiency anemia) 12/14/2014  . Myocardial infarction ([place]) 1992  . Renal insufficiency   . Sleep apnea   . Tremors of nervous system     Past Surgical History:  Procedure Laterality Date  . CATARACT EXTRACTION W/PHACO Left 07/15/2017   Procedure: CATARACT EXTRACTION PHACO AND INTRAOCULAR LENS PLACEMENT (IOC)-LEFT;  Surgeon: Birder Robson, MD;  Location: [facility] ORS;  Service: Ophthalmology;  Laterality: Left;  [place] 00.36.1 AP% 13.9 CDE 5.02 Fluid Pack lot # [id number]  . CATARACT EXTRACTION W/PHACO Right 08/26/2017   Procedure: CATARACT EXTRACTION PHACO AND INTRAOCULAR LENS PLACEMENT (IOC);  Surgeon: Birder Robson, MD;  Location: [facility] ORS;  Service: Ophthalmology;  Laterality: Right;  [place] 00:41.0 AP% 15.3 CDE 6.27 FLUID PACK LOT # [id number]  . CHOLECYSTECTOMY    . COLONOSCOPY WITH PROPOFOL N/A 04/10/2015   Procedure: COLONOSCOPY WITH PROPOFOL;  Surgeon: Manya Silvas, MD;  Location: [facility] ENDOSCOPY;  Service: Endoscopy;  Laterality: N/A;  . ESOPHAGOGASTRODUODENOSCOPY (EGD) WITH PROPOFOL N/A 04/10/2015   Procedure: ESOPHAGOGASTRODUODENOSCOPY (EGD) WITH PROPOFOL;  Surgeon: Manya Silvas, MD;  Location: [facility] ENDOSCOPY;  Service:  Endoscopy;  Laterality: N/A;  . fatty tumor removed on left shoulder    . gallbladdedr removed    . JOINT REPLACEMENT Bilateral    total knee replacements  . REPLACEMENT TOTAL KNEE BILATERAL Bilateral     Family History  Problem Relation Age of Onset  . Arthritis Mother   . Hypertension Mother   . Stroke Mother   . Hypertension Father   . Heart attack Father   . Breast cancer Neg Hx     Social History:  reports that she has never smoked. She has never used  smokeless tobacco. She reports that she does not drink alcohol or use drugs. The patient lives in [place].  She is accompanied by her daughter, Nicole Bishop: phone [phone] and cell [phone].  Allergies:  Allergies  Allergen Reactions  . Penicillins Rash and Other (See Comments)    Has patient had a PCN reaction causing immediate rash, facial/tongue/throat swelling, SOB or lightheadedness with hypotension: Unknown Has patient had a PCN reaction causing severe rash involving mucus membranes or skin necrosis: Unknown Has patient had a PCN reaction that required hospitalization: Unknown Has patient had a PCN reaction occurring within the last 10 years: Unknown If all of the above answers are "NO", then may proceed with Cephalosporin use.   . Nsaids Other (See Comments)    GI upset  . Atorvastatin Rash  . Celebrex [Celecoxib] Other (See Comments)    Constipation and stomach upset  . Felodipine Nausea Only  . Oxaprozin Nausea Only  . Rofecoxib Other (See Comments)    GI upset  . Simvastatin Rash    Current Medications: Current Outpatient Medications  Medication Sig Dispense Refill  . amLODipine (NORVASC) 5 MG tablet Take 5 mg by mouth daily.     Marland Kitchen apixaban (ELIQUIS) 5 MG TABS tablet Take 1 tablet (5 mg total) by mouth 2 (two) times daily. 60 tablet 0  . calcitRIOL (ROCALTROL) 0.25 MCG capsule Take 1 capsule by mouth daily.    . Cholecalciferol (VITAMIN D3) 2000 units capsule Take 4,000 Units by mouth daily.    . cyanocobalamin (,VITAMIN B-12,) 1000 MCG/ML injection Inject into the muscle.     . docusate sodium (COLACE) 100 MG capsule Take 100 mg by mouth daily.     Marland Kitchen escitalopram (LEXAPRO) 20 MG tablet Take 1 tablet (20 mg total) by mouth daily. 90 tablet 1  . esomeprazole (NEXIUM) 40 MG capsule Take 40 mg by mouth daily as needed (acid reflux).     . Ferrous Sulfate (SLOW FE) 142 (45 Fe) MG TBCR Take 1 tablet by mouth daily.    . furosemide (LASIX) 40 MG tablet Take one tablet  twice daily for 7 days then take 1 tablet daily.    Marland Kitchen loratadine (CLARITIN) 10 MG tablet Take 1 tablet (10 mg total) by mouth daily. 30 tablet 0  . olopatadine (PATANOL) 0.1 % ophthalmic solution Place 1 drop into both eyes 2 (two) times daily. At 6-8 hours intervals     . potassium chloride (K-DUR) 10 MEQ tablet Take 1 tablet twice daily for 7 days then take 1 tablet daily with Lasix.    Marland Kitchen vitamin B-12 (CYANOCOBALAMIN) 1000 MCG tablet Take 2,000 mcg by mouth daily.    Marland Kitchen labetalol (NORMODYNE) 100 MG tablet Take 0.5 tablets (50 mg total) by mouth at bedtime. (Patient not taking: Reported on 11/11/2017) 15 tablet 0  . lactulose (CONSTULOSE) 10 GM/15ML solution     . losartan (COZAAR) 50 MG tablet  Take 25 mg by mouth daily.     . metoprolol succinate (TOPROL-XL) 25 MG 24 hr tablet Take by mouth.    . mometasone (NASONEX) 50 MCG/ACT nasal spray Place 2 sprays into the nose daily.      . polyethylene glycol (MIRALAX / GLYCOLAX) packet Take 17 g by mouth daily as needed for mild constipation or moderate constipation.    . traMADol-acetaminophen (ULTRACET) 37.5-325 MG tablet Take 1 tablet by mouth daily as needed for moderate pain.      No current facility-administered medications for this visit.     Review of Systems:  GENERAL: Chronic fatigue.  No fevers or sweats.  Weight down 4 pounds.  PERFORMANCE STATUS (ECOG):  1 HEENT:  No visual changes, runny nose, sore throat, mouth sores or tenderness. Lungs: No shortness of breath or cough.  No hemoptysis. Cardiac:  No chest pain, palpitations, orthopnea, or PND.   GI:  Constipation.  Eating a lot of fruit.  No nausea, vomiting, diarrhea, melena or hematochezia. GU:  No urgency, frequency, dysuria, or hematuria. Musculoskeletal:  No back pain.  No joint pain.  No muscle tenderness. Extremities: Swelling in feet. Skin:  Lesion to RIGHT lower leg. No rashes or other skin changes. Neuro:  No headache, numbness or weakness, balance or coordination  issues. Endocrine:  No diabetes, thyroid issues, hot flashes or night sweats. Psych:  No mood changes, depression or anxiety. Pain:  No focal pain. Review of systems:  All other systems reviewed and found to be negative.   Physical Exam: Blood pressure 113/69, pulse 65, temperature (!) 96 F (35.6 C), temperature source Tympanic, resp. rate 18, weight 262 lb (118.8 kg). GENERAL:  Well developed, well nourished, elderly woman sitting comfortably in a wheelchair in the exam room in no acute distress. MENTAL STATUS:  Alert and oriented to person, place and time. HEAD:  Shoulder length gray/white hair.  Normocephalic, atraumatic, face symmetric, no Cushingoid features. EYES:  Brown eyes.  Bilateral arcus.  Pupils equal round and reactive to light and accomodation.  No conjunctivitis or scleral icterus. ENT:  Oropharynx clear without lesion.  Upper dentures.  Tongue normal. Mucous membranes moist.  RESPIRATORY:  Clear to auscultation without rales, wheezes or rhonchi. CARDIOVASCULAR:  Regular rate and rhythm without murmur, rub or gallop. ABDOMEN:  Soft, non-tender, with active bowel sounds, and no hepatosplenomegaly.  No masses. SKIN:  Dark brown varigated lesion with irregular boarders to RIGHT distal tib/fib. No rashes, ulcers or other lesions. EXTREMITIES: Bilateral foot edema (right > left). No skin discoloration or tenderness.  No palpable cords. LYMPH NODES: No palpable cervical, supraclavicular, axillary or inguinal adenopathy  NEUROLOGICAL: Unremarkable. PSYCH:  Appropriate.  Smiling.   Appointment on 11/10/2017  Component Date Value Ref Range Status  . Sodium 11/10/2017 136  135 - 145 mmol/L Final  . Potassium 11/10/2017 4.0  3.5 - 5.1 mmol/L Final  . Chloride 11/10/2017 102  101 - 111 mmol/L Final  . CO2 11/10/2017 23  22 - 32 mmol/L Final  . Glucose, Bld 11/10/2017 100* 65 - 99 mg/dL Final  . BUN 11/10/2017 28* 6 - 20 mg/dL Final  . Creatinine, Ser 11/10/2017 1.56* 0.44 - 1.00  mg/dL Final  . Calcium 11/10/2017 9.3  8.9 - 10.3 mg/dL Final  . Total Protein 11/10/2017 7.2  6.5 - 8.1 g/dL Final  . Albumin 11/10/2017 3.9  3.5 - 5.0 g/dL Final  . AST 11/10/2017 16  15 - 41 U/L Final  . ALT 11/10/2017 10*  14 - 54 U/L Final  . Alkaline Phosphatase 11/10/2017 64  38 - 126 U/L Final  . Total Bilirubin 11/10/2017 0.7  0.3 - 1.2 mg/dL Final  . GFR calc non Af Amer 11/10/2017 29* >60 mL/min Final  . GFR calc Af Amer 11/10/2017 34* >60 mL/min Final   Comment: (NOTE) The eGFR has been calculated using the CKD EPI equation. This calculation has not been validated in all clinical situations. eGFR's persistently <60 mL/min signify possible Chronic Kidney Disease.   Georgiann Hahn gap 11/10/2017 11  5 - 15 Final   Performed at [facility], [address] [place], [place] [address]  . Ferritin 11/10/2017 41  11 - 307 ng/mL Final   Performed at [facility], [address] [place], [place] [address]  . WBC 11/10/2017 4.0  3.6 - 11.0 K/uL Final  . RBC 11/10/2017 4.25  3.80 - 5.20 MIL/uL Final  . Hemoglobin 11/10/2017 11.9* 12.0 - 16.0 g/dL Final  . HCT 11/10/2017 36.0  35.0 - 47.0 % Final  . MCV 11/10/2017 84.7  80.0 - 100.0 fL Final  . MCH 11/10/2017 27.9  26.0 - 34.0 pg Final  . MCHC 11/10/2017 33.0  32.0 - 36.0 g/dL Final  . RDW 11/10/2017 15.2* 11.5 - 14.5 % Final  . Platelets 11/10/2017 215  150 - 440 K/uL Final  . Neutrophils Relative % 11/10/2017 51  % Final  . Neutro Abs 11/10/2017 2.1  1.4 - 6.5 K/uL Final  . Lymphocytes Relative 11/10/2017 36  % Final  . Lymphs Abs 11/10/2017 1.4  1.0 - 3.6 K/uL Final  . Monocytes Relative 11/10/2017 10  % Final  . Monocytes Absolute 11/10/2017 0.4  0.2 - 0.9 K/uL Final  . Eosinophils Relative 11/10/2017 2  % Final  . Eosinophils Absolute 11/10/2017 0.1  0 - 0.7 K/uL Final  . Basophils Relative 11/10/2017 1  % Final  . Basophils Absolute 11/10/2017 0.0  0 - 0.1 K/uL Final   Performed at [facility], [address], [place], [place] [address]    Assessment:  ERNESTA TRABERT is a 84 y.o. African American woman with stage III chronic kidney disease and iron deficiency.  Hematocrit and MCV were normal on 09/03/2013.    She denies any GI bleeding.  Diet is modest.  EGD on 04/10/2015 revealed a large hiatal hernia and gastritis.  Biopsy was unremarkable.  Colonoscopy on 04/10/2015 revealed diverticulosis in the sigmoid, descending, transverse, and ascending colon.  She had internal hemorrhoids.  She is B12 deficient.  She received monthly injections at the [facility] (last 07/2016).  She began oral B12 in 08/2016.  B12 was 778 and folate 15.7 on 05/13/2017.  She is intolerant of oral iron secondary to baseline chronic constipation.   Labs on 09/12/2014 revealed a hematocrit of 30 with an MCV of 67. Ferritin was 6 (low) with a TIBC of 487 (high).  Normal studies included direct [place], folic acid, D47, haptoglobin, erythropoietin, and SPEP.    She has received weekly Venofer:  800 mg (10/21/2014 - 11/11/2014), 400 mg (03/03/2015 and 03/10/2015), 400 mg (10/20/2015 and 10/27/2015), 200 mg (04/16/2016), 600 mg (11/11/2016 - 11/25/2016), and 600 mg (05/23/2018 - 06/06/2018).  She receives Venofer if her ferritin is < 30.    Ferritin has been followed:  14 on 12/28/2014, 9 on 02/23/2015, 113 on 03/17/2015, 12 on 10/13/2015, 31 on 01/10/2016, 19 on 04/11/2016, 23 on 07/18/2016, 9 on 11/11/2016, 9 on 05/13/2017, 46 on 08/07/2017,  and 41 on 11/10/2017.  Goal ferritin is 100.  Symptomatically, she feels weak and tired.  Patient has no acute physical concerns today. Exam reveals edema in her feet.  Hemoglobin 11.9, hematocrit 36.0, MCV 84.7, and platelets 215,000. BUN 28 and creatinine 1.56 (CrCl 38.7 mL/min).   Plan: 1.  Review labs from yesterday.  Hemoglobin and MCV normal   Ferritin 41. 2.  Discuss recurrent iron deficiency.  Etiology is unclear.  Discuss follow-up with Dr. Vira Agar (GI) for VCE.  3.   Discuss infrarenal aneurysm found in the ED last year. Patient needs follow up imaging in June 2019. Will discuss with PCP.  4.  RTC in 3 months labs (CBC with diff, ferritin, B12, folate). 5.  RTC in 6 months for MD assessment, labs (CBC with diff, ferritin - day before) +/- Venofer.   Honor Loh, NP  11/11/2017, 11:08 AM   I saw and evaluated the patient, participating in the key portions of the service and reviewing pertinent diagnostic studies and records.  I reviewed the nurse practitioner's note and agree with the findings and the plan.  The assessment and plan were discussed with the patient.  A few questions were asked by the patient and answered.   Lequita Asal, MD 11/11/2017, 11:08 AM

## 2017-11-20 ENCOUNTER — Ambulatory Visit: Payer: Medicare HMO | Admitting: Psychiatry

## 2017-11-27 DIAGNOSIS — I4891 Unspecified atrial fibrillation: Secondary | ICD-10-CM | POA: Diagnosis not present

## 2017-11-27 DIAGNOSIS — I10 Essential (primary) hypertension: Secondary | ICD-10-CM | POA: Diagnosis not present

## 2017-11-27 DIAGNOSIS — I5032 Chronic diastolic (congestive) heart failure: Secondary | ICD-10-CM | POA: Diagnosis not present

## 2017-11-27 DIAGNOSIS — R0602 Shortness of breath: Secondary | ICD-10-CM | POA: Diagnosis not present

## 2017-12-01 DIAGNOSIS — B351 Tinea unguium: Secondary | ICD-10-CM | POA: Diagnosis not present

## 2017-12-01 DIAGNOSIS — M79674 Pain in right toe(s): Secondary | ICD-10-CM | POA: Diagnosis not present

## 2017-12-01 DIAGNOSIS — R600 Localized edema: Secondary | ICD-10-CM | POA: Diagnosis not present

## 2017-12-01 DIAGNOSIS — M79675 Pain in left toe(s): Secondary | ICD-10-CM | POA: Diagnosis not present

## 2017-12-01 DIAGNOSIS — I872 Venous insufficiency (chronic) (peripheral): Secondary | ICD-10-CM | POA: Diagnosis not present

## 2017-12-02 DIAGNOSIS — R2689 Other abnormalities of gait and mobility: Secondary | ICD-10-CM | POA: Diagnosis not present

## 2017-12-02 DIAGNOSIS — I10 Essential (primary) hypertension: Secondary | ICD-10-CM | POA: Diagnosis not present

## 2017-12-02 DIAGNOSIS — F325 Major depressive disorder, single episode, in full remission: Secondary | ICD-10-CM | POA: Diagnosis not present

## 2017-12-02 DIAGNOSIS — I5032 Chronic diastolic (congestive) heart failure: Secondary | ICD-10-CM | POA: Diagnosis not present

## 2017-12-02 DIAGNOSIS — M1611 Unilateral primary osteoarthritis, right hip: Secondary | ICD-10-CM | POA: Diagnosis not present

## 2017-12-02 DIAGNOSIS — R269 Unspecified abnormalities of gait and mobility: Secondary | ICD-10-CM | POA: Diagnosis not present

## 2017-12-02 DIAGNOSIS — M19012 Primary osteoarthritis, left shoulder: Secondary | ICD-10-CM | POA: Diagnosis not present

## 2017-12-02 DIAGNOSIS — M19011 Primary osteoarthritis, right shoulder: Secondary | ICD-10-CM | POA: Diagnosis not present

## 2017-12-03 DIAGNOSIS — R269 Unspecified abnormalities of gait and mobility: Secondary | ICD-10-CM | POA: Insufficient documentation

## 2017-12-09 DIAGNOSIS — M19011 Primary osteoarthritis, right shoulder: Secondary | ICD-10-CM | POA: Diagnosis not present

## 2017-12-23 DIAGNOSIS — M19012 Primary osteoarthritis, left shoulder: Secondary | ICD-10-CM | POA: Diagnosis not present

## 2018-01-09 DIAGNOSIS — N183 Chronic kidney disease, stage 3 (moderate): Secondary | ICD-10-CM | POA: Diagnosis not present

## 2018-01-09 DIAGNOSIS — D631 Anemia in chronic kidney disease: Secondary | ICD-10-CM | POA: Diagnosis not present

## 2018-01-09 DIAGNOSIS — N2581 Secondary hyperparathyroidism of renal origin: Secondary | ICD-10-CM | POA: Diagnosis not present

## 2018-01-09 DIAGNOSIS — I10 Essential (primary) hypertension: Secondary | ICD-10-CM | POA: Diagnosis not present

## 2018-02-03 ENCOUNTER — Telehealth: Payer: Self-pay | Admitting: *Deleted

## 2018-02-03 ENCOUNTER — Inpatient Hospital Stay: Payer: Medicare HMO | Attending: Hematology and Oncology

## 2018-02-03 ENCOUNTER — Other Ambulatory Visit: Payer: Self-pay

## 2018-02-03 DIAGNOSIS — D509 Iron deficiency anemia, unspecified: Secondary | ICD-10-CM | POA: Insufficient documentation

## 2018-02-03 DIAGNOSIS — E538 Deficiency of other specified B group vitamins: Secondary | ICD-10-CM

## 2018-02-03 LAB — CBC WITH DIFFERENTIAL/PLATELET
Basophils Absolute: 0 10*3/uL (ref 0–0.1)
Basophils Relative: 1 %
Eosinophils Absolute: 0 10*3/uL (ref 0–0.7)
Eosinophils Relative: 1 %
HCT: 35.8 % (ref 35.0–47.0)
Hemoglobin: 11.9 g/dL — ABNORMAL LOW (ref 12.0–16.0)
Lymphocytes Relative: 28 %
Lymphs Abs: 1.1 10*3/uL (ref 1.0–3.6)
MCH: 28.5 pg (ref 26.0–34.0)
MCHC: 33.4 g/dL (ref 32.0–36.0)
MCV: 85.4 fL (ref 80.0–100.0)
Monocytes Absolute: 0.4 10*3/uL (ref 0.2–0.9)
Monocytes Relative: 10 %
Neutro Abs: 2.5 10*3/uL (ref 1.4–6.5)
Neutrophils Relative %: 60 %
Platelets: 193 10*3/uL (ref 150–440)
RBC: 4.19 MIL/uL (ref 3.80–5.20)
RDW: 15.7 % — ABNORMAL HIGH (ref 11.5–14.5)
WBC: 4.1 10*3/uL (ref 3.6–11.0)

## 2018-02-03 LAB — FERRITIN: Ferritin: 32 ng/mL (ref 11–307)

## 2018-02-03 LAB — FOLATE: Folate: 10.8 ng/mL (ref >5.9)

## 2018-02-03 LAB — VITAMIN B12: Vitamin B-12: 1013 pg/mL — ABNORMAL HIGH (ref 180–914)

## 2018-02-03 NOTE — Telephone Encounter (Signed)
-----   Message from Karen Kitchens, NP sent at 02/03/2018  1:25 PM EDT ----- Regarding: Call Ferritin low at 32. She usually gets Venofer if she is < 30. Symptomatic? Does she feel like she needs addition Fe at this point? If so, we can schedule weekly x 2.   Gaspar Bidding

## 2018-02-03 NOTE — Telephone Encounter (Signed)
Called paitent's daughter, [name] to inquire if patient is symptomatic ( SOB, tired, weak, etc).  Her ferritin is 32 and we usually transfuse iron if she is below 30.  Asked daughter to give me a call back.  If patient is symptomatic we will infuse with Venofer weekly X 2.

## 2018-02-04 ENCOUNTER — Telehealth: Payer: Self-pay | Admitting: *Deleted

## 2018-02-04 NOTE — Telephone Encounter (Signed)
Patient's daughter, Cloyd Stagers called back to inform me that patient is weak, tired and SOB.  I have sent scheduling message for Venofer weekly X 2 per Gaspar Bidding, NP.  LVM yesterday to see if patient is symptomatic as her ferritin has dropped to 32.  We normally infuse when <30.

## 2018-02-04 NOTE — Telephone Encounter (Signed)
Venofer weekly X 2.Per Rodena Piety 02/04/18 staff message  I called and left a message on the patient's daughter(Terrie) vmail Making her aware of the scheduled date and time of her mother's appt.

## 2018-02-06 ENCOUNTER — Inpatient Hospital Stay: Payer: Medicare HMO

## 2018-02-06 VITALS — BP 166/83 | HR 66 | Resp 20

## 2018-02-06 DIAGNOSIS — D509 Iron deficiency anemia, unspecified: Secondary | ICD-10-CM | POA: Diagnosis not present

## 2018-02-06 MED ORDER — SODIUM CHLORIDE 0.9 % IV SOLN
INTRAVENOUS | Status: DC
  Administered 2018-02-06: 14:00:00 via INTRAVENOUS
  Filled 2018-02-06: qty 1000

## 2018-02-06 MED ORDER — IRON SUCROSE 20 MG/ML IV SOLN
200.0000 mg | Freq: Once | INTRAVENOUS | Status: AC
  Administered 2018-02-06: 200 mg via INTRAVENOUS
  Filled 2018-02-06: qty 10

## 2018-02-13 ENCOUNTER — Inpatient Hospital Stay: Payer: Medicare HMO

## 2018-02-13 VITALS — BP 107/69 | HR 71 | Temp 99.8°F | Resp 20

## 2018-02-13 DIAGNOSIS — D509 Iron deficiency anemia, unspecified: Secondary | ICD-10-CM | POA: Diagnosis not present

## 2018-02-13 MED ORDER — IRON SUCROSE 20 MG/ML IV SOLN
200.0000 mg | Freq: Once | INTRAVENOUS | Status: AC
  Administered 2018-02-13: 200 mg via INTRAVENOUS
  Filled 2018-02-13: qty 10

## 2018-02-13 MED ORDER — SODIUM CHLORIDE 0.9 % IV SOLN
INTRAVENOUS | Status: DC
  Administered 2018-02-13: 14:00:00 via INTRAVENOUS
  Filled 2018-02-13: qty 1000

## 2018-02-17 DIAGNOSIS — I5032 Chronic diastolic (congestive) heart failure: Secondary | ICD-10-CM | POA: Diagnosis not present

## 2018-02-17 DIAGNOSIS — R0602 Shortness of breath: Secondary | ICD-10-CM | POA: Diagnosis not present

## 2018-02-17 DIAGNOSIS — I4891 Unspecified atrial fibrillation: Secondary | ICD-10-CM | POA: Diagnosis not present

## 2018-02-17 DIAGNOSIS — I10 Essential (primary) hypertension: Secondary | ICD-10-CM | POA: Diagnosis not present

## 2018-02-17 DIAGNOSIS — E782 Mixed hyperlipidemia: Secondary | ICD-10-CM | POA: Diagnosis not present

## 2018-03-26 DIAGNOSIS — H43813 Vitreous degeneration, bilateral: Secondary | ICD-10-CM | POA: Diagnosis not present

## 2018-04-30 ENCOUNTER — Emergency Department: Payer: Medicare HMO

## 2018-04-30 ENCOUNTER — Emergency Department
Admission: EM | Admit: 2018-04-30 | Discharge: 2018-04-30 | Disposition: A | Discharge status: Home / Self Care | Payer: Medicare HMO | Attending: Emergency Medicine | Admitting: Emergency Medicine

## 2018-04-30 ENCOUNTER — Other Ambulatory Visit: Payer: Self-pay

## 2018-04-30 DIAGNOSIS — K449 Diaphragmatic hernia without obstruction or gangrene: Secondary | ICD-10-CM | POA: Diagnosis not present

## 2018-04-30 DIAGNOSIS — I129 Hypertensive chronic kidney disease with stage 1 through stage 4 chronic kidney disease, or unspecified chronic kidney disease: Secondary | ICD-10-CM | POA: Insufficient documentation

## 2018-04-30 DIAGNOSIS — M6281 Muscle weakness (generalized): Secondary | ICD-10-CM | POA: Insufficient documentation

## 2018-04-30 DIAGNOSIS — Z79899 Other long term (current) drug therapy: Secondary | ICD-10-CM | POA: Insufficient documentation

## 2018-04-30 DIAGNOSIS — J9811 Atelectasis: Secondary | ICD-10-CM | POA: Diagnosis not present

## 2018-04-30 DIAGNOSIS — R531 Weakness: Secondary | ICD-10-CM | POA: Diagnosis not present

## 2018-04-30 DIAGNOSIS — Z96653 Presence of artificial knee joint, bilateral: Secondary | ICD-10-CM | POA: Diagnosis not present

## 2018-04-30 DIAGNOSIS — E039 Hypothyroidism, unspecified: Secondary | ICD-10-CM | POA: Diagnosis not present

## 2018-04-30 DIAGNOSIS — Z7901 Long term (current) use of anticoagulants: Secondary | ICD-10-CM | POA: Insufficient documentation

## 2018-04-30 DIAGNOSIS — N183 Chronic kidney disease, stage 3 (moderate): Secondary | ICD-10-CM | POA: Diagnosis not present

## 2018-04-30 DIAGNOSIS — I2510 Atherosclerotic heart disease of native coronary artery without angina pectoris: Secondary | ICD-10-CM | POA: Diagnosis not present

## 2018-04-30 DIAGNOSIS — R0600 Dyspnea, unspecified: Secondary | ICD-10-CM | POA: Diagnosis not present

## 2018-04-30 LAB — CBC WITH DIFFERENTIAL/PLATELET
BASOS ABS: 0.1 10*3/uL (ref 0–0.1)
BASOS PCT: 1 %
EOS ABS: 0.1 10*3/uL (ref 0–0.7)
Eosinophils Relative: 2 %
HCT: 32.3 % — ABNORMAL LOW (ref 35.0–47.0)
Hemoglobin: 10.7 g/dL — ABNORMAL LOW (ref 12.0–16.0)
Lymphocytes Relative: 20 %
Lymphs Abs: 1.4 10*3/uL (ref 1.0–3.6)
MCH: 28.5 pg (ref 26.0–34.0)
MCHC: 33 g/dL (ref 32.0–36.0)
MCV: 86.6 fL (ref 80.0–100.0)
Monocytes Absolute: 1 10*3/uL — ABNORMAL HIGH (ref 0.2–0.9)
Monocytes Relative: 14 %
NEUTROS ABS: 4.7 10*3/uL (ref 1.4–6.5)
Neutrophils Relative %: 63 %
Platelets: 253 10*3/uL (ref 150–440)
RBC: 3.74 MIL/uL — ABNORMAL LOW (ref 3.80–5.20)
RDW: 14.1 % (ref 11.5–14.5)
WBC: 7.3 10*3/uL (ref 3.6–11.0)

## 2018-04-30 LAB — COMPREHENSIVE METABOLIC PANEL
ALBUMIN: 3.3 g/dL — AB (ref 3.5–5.0)
ALK PHOS: 55 U/L (ref 38–126)
ALT: 9 U/L (ref 0–44)
ANION GAP: 12 (ref 5–15)
AST: 14 U/L — ABNORMAL LOW (ref 15–41)
BUN: 22 mg/dL (ref 8–23)
CALCIUM: 9.1 mg/dL (ref 8.9–10.3)
CO2: 22 mmol/L (ref 22–32)
Chloride: 103 mmol/L (ref 98–111)
Creatinine, Ser: 1.49 mg/dL — ABNORMAL HIGH (ref 0.44–1.00)
GFR, EST AFRICAN AMERICAN: 36 mL/min — AB (ref >60)
GFR, EST NON AFRICAN AMERICAN: 31 mL/min — AB (ref >60)
Glucose, Bld: 99 mg/dL (ref 70–99)
Potassium: 4 mmol/L (ref 3.5–5.1)
Sodium: 137 mmol/L (ref 135–145)
TOTAL PROTEIN: 6.9 g/dL (ref 6.5–8.1)
Total Bilirubin: 0.5 mg/dL (ref 0.3–1.2)

## 2018-04-30 LAB — URINALYSIS, COMPLETE (UACMP) WITH MICROSCOPIC
Bacteria, UA: NONE SEEN
Bilirubin Urine: NEGATIVE
GLUCOSE, UA: NEGATIVE mg/dL
HGB URINE DIPSTICK: NEGATIVE
KETONES UR: NEGATIVE mg/dL
LEUKOCYTES UA: NEGATIVE
NITRITE: NEGATIVE
PH: 5 (ref 5.0–8.0)
PROTEIN: NEGATIVE mg/dL
Specific Gravity, Urine: 1.017 (ref 1.005–1.030)
Squamous Epithelial / LPF: NONE SEEN (ref 0–5)

## 2018-04-30 LAB — TROPONIN I

## 2018-04-30 MED ORDER — SODIUM CHLORIDE 0.9 % IV BOLUS
500.0000 mL | Freq: Once | INTRAVENOUS | Status: AC
  Administered 2018-04-30: 500 mL via INTRAVENOUS

## 2018-04-30 NOTE — Discharge Instructions (Addendum)
Return to the ER for new, worsening, persistent severe weakness, difficulty breathing, lightheadedness, vomiting, or any other new or worsening symptoms that concern you.  Follow-up with your doctor on Tuesday as scheduled.

## 2018-04-30 NOTE — ED Triage Notes (Addendum)
Pt arrived via ems with weakness from [facility]. Pt states that she started feeling weak last week and got worse. Pt NAD, respirations even and non labored.

## 2018-04-30 NOTE — ED Provider Notes (Signed)
[facility] Emergency Department Provider Note ____________________________________________   First MD Initiated Contact with Patient 04/30/18 1537     (approximate)  I have reviewed the triage vital signs and the nursing notes.   HISTORY  Chief Complaint Weakness    HPI Nicole Bishop is a 84 y.o. female with PMH as noted below who presents with generalized weakness over approximately the last week, gradual onset, associated with some shortness of breath as well as with some dysuria and nausea.  The patient denies chest pain, vomiting, or any fever or chills.  Past Medical History:  Diagnosis Date  . Anemia   . Anxiety   . Arthritis   . Chronic kidney disease   . Coronary artery disease   . Depression   . Dizziness   . Dyspnea   . Dysrhythmia    gets tachy if anxious or excited  . GERD (gastroesophageal reflux disease)   . History of hiatal hernia   . HOH (hard of hearing)   . Hypertension   . Hypothyroidism   . IDA (iron deficiency anemia) 12/14/2014  . Myocardial infarction ([place]) 1992  . Renal insufficiency   . Sleep apnea   . Tremors of nervous system     Patient Active Problem List   Diagnosis Date Noted  . Visual hallucinations 07/16/2017  . SOB (shortness of breath) on exertion 10/30/2016  . Depression, major, single episode, complete remission ([place]) 08/03/2016  . Kidney failure 03/17/2015  . Chronic kidney disease (CKD), stage III (moderate) ([place]) 12/22/2014  . Anemia, iron deficiency 12/22/2014  . IDA (iron deficiency anemia) 12/14/2014  . Chronic constipation 06/03/2014  . Essential (primary) hypertension 05/03/2014  . Acid reflux 04/20/2014  . H/O: obesity 04/20/2014  . Airway hyperreactivity 04/20/2014  . Arthritis of shoulder region, degenerative 01/27/2014  . B12 deficiency 12/27/2013  . Dizziness 03/13/2012  . Back ache 11/04/2011  . Avitaminosis D 11/04/2011  . Combined fat and carbohydrate induced hyperlipemia  10/31/2011  . Absolute anemia 10/31/2011  . Hypertension 05/21/2011  . Depression 05/21/2011  . Hearing loss 05/21/2011    Past Surgical History:  Procedure Laterality Date  . CATARACT EXTRACTION W/PHACO Left 07/15/2017   Procedure: CATARACT EXTRACTION PHACO AND INTRAOCULAR LENS PLACEMENT (IOC)-LEFT;  Surgeon: Birder Robson, MD;  Location: [facility] ORS;  Service: Ophthalmology;  Laterality: Left;  [place] 00.36.1 AP% 13.9 CDE 5.02 Fluid Pack lot # [id number]  . CATARACT EXTRACTION W/PHACO Right 08/26/2017   Procedure: CATARACT EXTRACTION PHACO AND INTRAOCULAR LENS PLACEMENT (IOC);  Surgeon: Birder Robson, MD;  Location: [facility] ORS;  Service: Ophthalmology;  Laterality: Right;  [place] 00:41.0 AP% 15.3 CDE 6.27 FLUID PACK LOT # [id number]  . CHOLECYSTECTOMY    . COLONOSCOPY WITH PROPOFOL N/A 04/10/2015   Procedure: COLONOSCOPY WITH PROPOFOL;  Surgeon: Manya Silvas, MD;  Location: [facility] ENDOSCOPY;  Service: Endoscopy;  Laterality: N/A;  . ESOPHAGOGASTRODUODENOSCOPY (EGD) WITH PROPOFOL N/A 04/10/2015   Procedure: ESOPHAGOGASTRODUODENOSCOPY (EGD) WITH PROPOFOL;  Surgeon: Manya Silvas, MD;  Location: [facility] ENDOSCOPY;  Service: Endoscopy;  Laterality: N/A;  . fatty tumor removed on left shoulder    . gallbladdedr removed    . JOINT REPLACEMENT Bilateral    total knee replacements  . REPLACEMENT TOTAL KNEE BILATERAL Bilateral     Prior to Admission medications   Medication Sig Start Date End Date Taking? Authorizing Provider  amLODipine (NORVASC) 5 MG tablet Take 5 mg by mouth daily.  03/27/15   [provider]  apixaban Arne Cleveland) 5  MG TABS tablet Take 1 tablet (5 mg total) by mouth 2 (two) times daily. 10/16/17   Merlyn Lot, MD  calcitRIOL (ROCALTROL) 0.25 MCG capsule Take 1 capsule by mouth daily. 09/25/17   [provider]  Cholecalciferol (VITAMIN D3) 2000 units capsule Take 4,000 Units by mouth daily.    [provider]  cyanocobalamin (,VITAMIN B-12,) 1000  MCG/ML injection Inject into the muscle.  12/27/13   [provider]  docusate sodium (COLACE) 100 MG capsule Take 100 mg by mouth daily.  06/03/14   [provider]  escitalopram (LEXAPRO) 20 MG tablet Take 1 tablet (20 mg total) by mouth daily. 07/31/17   Ursula Alert, MD  esomeprazole (NEXIUM) 40 MG capsule Take 40 mg by mouth daily as needed (acid reflux).     [provider]  Ferrous Sulfate (SLOW FE) 142 (45 Fe) MG TBCR Take 1 tablet by mouth daily.    [provider]  furosemide (LASIX) 40 MG tablet Take one tablet twice daily for 7 days then take 1 tablet daily. 10/29/17   [provider]  labetalol (NORMODYNE) 100 MG tablet Take 0.5 tablets (50 mg total) by mouth at bedtime. Patient not taking: Reported on 11/11/2017 07/22/12   Jackolyn Confer, MD  lactulose (CONSTULOSE) 10 GM/15ML solution  04/01/16   [provider]  loratadine (CLARITIN) 10 MG tablet Take 1 tablet (10 mg total) by mouth daily. 02/12/13   Jackolyn Confer, MD  losartan (COZAAR) 50 MG tablet Take 25 mg by mouth daily.  05/01/15   [provider]  metoprolol succinate (TOPROL-XL) 25 MG 24 hr tablet Take by mouth. 10/29/17 10/29/18  [provider]  mometasone (NASONEX) 50 MCG/ACT nasal spray Place 2 sprays into the nose daily.      [provider]  olopatadine (PATANOL) 0.1 % ophthalmic solution Place 1 drop into both eyes 2 (two) times daily. At 6-8 hours intervals     [provider]  polyethylene glycol (MIRALAX / GLYCOLAX) packet Take 17 g by mouth daily as needed for mild constipation or moderate constipation.    [provider]  potassium chloride (K-DUR) 10 MEQ tablet Take 1 tablet twice daily for 7 days then take 1 tablet daily with Lasix. 10/29/17   [provider]  traMADol-acetaminophen (ULTRACET) 37.5-325 MG tablet Take 1 tablet by mouth daily as needed for moderate pain.  05/01/15   [provider]    vitamin B-12 (CYANOCOBALAMIN) 1000 MCG tablet Take 2,000 mcg by mouth daily.    [provider]    Allergies Penicillins; Nsaids; Atorvastatin; Celebrex [celecoxib]; Felodipine; Oxaprozin; Rofecoxib; and Simvastatin  Family History  Problem Relation Age of Onset  . Arthritis Mother   . Hypertension Mother   . Stroke Mother   . Hypertension Father   . Heart attack Father   . Breast cancer Neg Hx     Social History Social History   Tobacco Use  . Smoking status: Never Smoker  . Smokeless tobacco: Never Used  Substance Use Topics  . Alcohol use: No  . Drug use: No    Review of Systems  Constitutional: No fever/chills.  Positive for weakness. Eyes: No visual changes. ENT: No sore throat. Cardiovascular: Denies chest pain. Respiratory: Positive for shortness of breath. Gastrointestinal: No vomiting or diarrhea. Genitourinary: Positive for dysuria.  Musculoskeletal: Negative for back pain. Skin: Negative for rash. Neurological: Negative for headache.   ____________________________________________   PHYSICAL EXAM:  VITAL SIGNS: ED Triage Vitals  Enc Vitals Group     BP 04/30/18 1530 126/65     Pulse Rate 04/30/18 1530 71     Resp 04/30/18 1530 16     Temp 04/30/18 1532 98 F (36.7 C)     Temp Source 04/30/18 1532 Oral     SpO2 04/30/18 1530 94 %     Weight 04/30/18 1533 260 lb (117.9 kg)     Height 04/30/18 1533 6' (1.829 m)     Head Circumference --      Peak Flow --      Pain Score 04/30/18 1533 0     Pain Loc --      Pain Edu? --      Excl. in [place]? --     Constitutional: Alert and oriented.  Relatively comfortable appearing in no acute distress. Eyes: Conjunctivae are normal.  EOMI.  PERRLA. Head: Atraumatic. Nose: No congestion/rhinnorhea. Mouth/Throat: Mucous membranes are dry.   Neck: Normal range of motion.  Cardiovascular: Normal rate, regular rhythm. Grossly normal heart sounds.  Good peripheral circulation. Respiratory: Normal  respiratory effort.  No retractions. Lungs CTAB. Gastrointestinal: Soft and nontender. No distention.  Genitourinary: No flank tenderness. Musculoskeletal: No lower extremity edema.  Extremities warm and well perfused.  Neurologic:  Normal speech and language. No gross focal neurologic deficits are appreciated.  Skin:  Skin is warm and dry. No rash noted. Psychiatric: Mood and affect are normal. Speech and behavior are normal.  ____________________________________________   LABS (all labs ordered are listed, but only abnormal results are displayed)  Labs Reviewed  COMPREHENSIVE METABOLIC PANEL - Abnormal; Notable for the following components:      Result Value   Creatinine, Ser 1.49 (*)    Albumin 3.3 (*)    AST 14 (*)    GFR calc non Af Amer 31 (*)    GFR calc Af Amer 36 (*)    All other components within normal limits  CBC WITH DIFFERENTIAL/PLATELET - Abnormal; Notable for the following components:   RBC 3.74 (*)    Hemoglobin 10.7 (*)    HCT 32.3 (*)    Monocytes Absolute 1.0 (*)    All other components within normal limits  URINALYSIS, COMPLETE (UACMP) WITH MICROSCOPIC - Abnormal; Notable for the following components:   Color, Urine YELLOW (*)    APPearance CLEAR (*)    All other components within normal limits  TROPONIN I   ____________________________________________  EKG  ED ECG REPORT I, Arta Silence, the attending physician, personally viewed and interpreted this ECG.  Date: 04/30/2018 EKG Time: 1531 Rate: 73 Rhythm: normal sinus rhythm QRS Axis: normal Intervals: normal ST/T Wave abnormalities: Nonspecific ST abnormalities Narrative Interpretation: no evidence of acute ischemia  ____________________________________________  RADIOLOGY  CXR: Possible hilar adenopathy CT chest: No gross enlarged lymph nodes.  Enlarged pulmonary vascular trunk.  Left lower lobe  atelectasis.  ____________________________________________   PROCEDURES  Procedure(s) performed: No  Procedures  Critical Care performed: No ____________________________________________   INITIAL IMPRESSION / ASSESSMENT AND PLAN / ED COURSE  Pertinent labs & imaging results that were available during my care of the patient were reviewed by me and considered in my medical decision making (see chart for details).  84 year old female with PMH as noted above presents with gradual onset of worsening generalized weakness over the last week.  On review of systems she does endorse some shortness of breath as well as intermittent dysuria.  I reviewed the past medical records in Epic; the patient was most  recently seen in the ED in February with weakness and palpitations, with a negative work-up at that time.  On exam, she is relatively well-appearing for her age.  The vital signs are normal.  The remainder of the exam is as described above.  Differential includes UTI or other infection, dehydration, other electrolyte abnormality, worsening renal insufficiency, less likely cardiac etiology.  Will obtain labs, infection work-up, give fluids, and reassess.  ----------------------------------------- 8:17 PM on 04/30/2018 -----------------------------------------  The patient's lab work-up was unremarkable except for anemia, slightly worse than her baseline but not requiring acute intervention.  UA is negative.  Chest x-ray showed no focal infiltrate.  There is some question of possible hilar adenopathy, so I obtained a CT chest.  It showed no significant gross adenopathy; the chest x-ray findings are likely explained by an enlarged pulmonary trunk.  There was some possible atelectasis versus infiltrate in the left lower lobe although the patient has no fever, elevated WBC count, or cough to suggest pneumonia so I suspect atelectasis.  The patient is feeling somewhat better after fluids.  She and  her family members are comfortable with going home.  She is stable for discharge at this time.  She has follow-up with her primary care doctor in 5 days.  Return precautions given, and the patient and her family members expressed understanding. ____________________________________________   FINAL CLINICAL IMPRESSION(S) / ED DIAGNOSES  Final diagnoses:  Generalized weakness      NEW MEDICATIONS STARTED DURING THIS VISIT:  New Prescriptions   No medications on file     Note:  This document was prepared using Dragon voice recognition software and may include unintentional dictation errors.    Arta Silence, MD 04/30/18 2018

## 2018-04-30 NOTE — ED Notes (Signed)
Patient transported to CT 

## 2018-05-05 DIAGNOSIS — F330 Major depressive disorder, recurrent, mild: Secondary | ICD-10-CM | POA: Diagnosis not present

## 2018-05-05 DIAGNOSIS — Z9181 History of falling: Secondary | ICD-10-CM | POA: Diagnosis not present

## 2018-05-05 DIAGNOSIS — I10 Essential (primary) hypertension: Secondary | ICD-10-CM | POA: Diagnosis not present

## 2018-05-05 DIAGNOSIS — Z0000 Encounter for general adult medical examination without abnormal findings: Secondary | ICD-10-CM | POA: Diagnosis not present

## 2018-05-05 DIAGNOSIS — I5032 Chronic diastolic (congestive) heart failure: Secondary | ICD-10-CM | POA: Diagnosis not present

## 2018-05-05 DIAGNOSIS — I480 Paroxysmal atrial fibrillation: Secondary | ICD-10-CM | POA: Diagnosis not present

## 2018-05-05 DIAGNOSIS — Z1239 Encounter for other screening for malignant neoplasm of breast: Secondary | ICD-10-CM | POA: Diagnosis not present

## 2018-05-06 ENCOUNTER — Other Ambulatory Visit: Payer: Self-pay | Admitting: Internal Medicine

## 2018-05-06 DIAGNOSIS — F330 Major depressive disorder, recurrent, mild: Secondary | ICD-10-CM | POA: Insufficient documentation

## 2018-05-06 DIAGNOSIS — Z1231 Encounter for screening mammogram for malignant neoplasm of breast: Secondary | ICD-10-CM

## 2018-05-11 ENCOUNTER — Inpatient Hospital Stay: Payer: Medicare HMO | Attending: Hematology and Oncology

## 2018-05-11 DIAGNOSIS — I129 Hypertensive chronic kidney disease with stage 1 through stage 4 chronic kidney disease, or unspecified chronic kidney disease: Secondary | ICD-10-CM | POA: Diagnosis not present

## 2018-05-11 DIAGNOSIS — E039 Hypothyroidism, unspecified: Secondary | ICD-10-CM | POA: Insufficient documentation

## 2018-05-11 DIAGNOSIS — R600 Localized edema: Secondary | ICD-10-CM | POA: Insufficient documentation

## 2018-05-11 DIAGNOSIS — D509 Iron deficiency anemia, unspecified: Secondary | ICD-10-CM | POA: Insufficient documentation

## 2018-05-11 DIAGNOSIS — Z79899 Other long term (current) drug therapy: Secondary | ICD-10-CM | POA: Diagnosis not present

## 2018-05-11 DIAGNOSIS — E538 Deficiency of other specified B group vitamins: Secondary | ICD-10-CM | POA: Diagnosis not present

## 2018-05-11 DIAGNOSIS — F419 Anxiety disorder, unspecified: Secondary | ICD-10-CM | POA: Diagnosis not present

## 2018-05-11 DIAGNOSIS — F329 Major depressive disorder, single episode, unspecified: Secondary | ICD-10-CM | POA: Diagnosis not present

## 2018-05-11 DIAGNOSIS — R531 Weakness: Secondary | ICD-10-CM | POA: Insufficient documentation

## 2018-05-11 DIAGNOSIS — Z7901 Long term (current) use of anticoagulants: Secondary | ICD-10-CM | POA: Diagnosis not present

## 2018-05-11 LAB — CBC WITH DIFFERENTIAL/PLATELET
Basophils Absolute: 0.1 10*3/uL (ref 0–0.1)
Basophils Relative: 1 %
Eosinophils Absolute: 0.1 10*3/uL (ref 0–0.7)
Eosinophils Relative: 2 %
HCT: 31.3 % — ABNORMAL LOW (ref 35.0–47.0)
Hemoglobin: 10.3 g/dL — ABNORMAL LOW (ref 12.0–16.0)
Lymphocytes Relative: 23 %
Lymphs Abs: 1.1 10*3/uL (ref 1.0–3.6)
MCH: 28.2 pg (ref 26.0–34.0)
MCHC: 33 g/dL (ref 32.0–36.0)
MCV: 85.5 fL (ref 80.0–100.0)
Monocytes Absolute: 0.5 10*3/uL (ref 0.2–0.9)
Monocytes Relative: 10 %
Neutro Abs: 3 10*3/uL (ref 1.4–6.5)
Neutrophils Relative %: 64 %
Platelets: 268 10*3/uL (ref 150–440)
RBC: 3.66 MIL/uL — ABNORMAL LOW (ref 3.80–5.20)
RDW: 14.3 % (ref 11.5–14.5)
WBC: 4.8 10*3/uL (ref 3.6–11.0)

## 2018-05-11 LAB — FERRITIN: Ferritin: 132 ng/mL (ref 11–307)

## 2018-05-12 ENCOUNTER — Inpatient Hospital Stay (HOSPITAL_BASED_OUTPATIENT_CLINIC_OR_DEPARTMENT_OTHER): Payer: Medicare HMO | Admitting: Hematology and Oncology

## 2018-05-12 ENCOUNTER — Encounter: Payer: Self-pay | Admitting: Hematology and Oncology

## 2018-05-12 ENCOUNTER — Inpatient Hospital Stay: Payer: Medicare HMO

## 2018-05-12 VITALS — BP 161/86 | HR 65 | Temp 98.1°F | Wt 266.0 lb

## 2018-05-12 DIAGNOSIS — Z79899 Other long term (current) drug therapy: Secondary | ICD-10-CM | POA: Diagnosis not present

## 2018-05-12 DIAGNOSIS — N183 Chronic kidney disease, stage 3 unspecified: Secondary | ICD-10-CM

## 2018-05-12 DIAGNOSIS — E039 Hypothyroidism, unspecified: Secondary | ICD-10-CM

## 2018-05-12 DIAGNOSIS — I10 Essential (primary) hypertension: Secondary | ICD-10-CM | POA: Diagnosis not present

## 2018-05-12 DIAGNOSIS — I714 Abdominal aortic aneurysm, without rupture: Secondary | ICD-10-CM

## 2018-05-12 DIAGNOSIS — R600 Localized edema: Secondary | ICD-10-CM | POA: Diagnosis not present

## 2018-05-12 DIAGNOSIS — E538 Deficiency of other specified B group vitamins: Secondary | ICD-10-CM | POA: Diagnosis not present

## 2018-05-12 DIAGNOSIS — F329 Major depressive disorder, single episode, unspecified: Secondary | ICD-10-CM

## 2018-05-12 DIAGNOSIS — D509 Iron deficiency anemia, unspecified: Secondary | ICD-10-CM | POA: Diagnosis not present

## 2018-05-12 DIAGNOSIS — I2510 Atherosclerotic heart disease of native coronary artery without angina pectoris: Secondary | ICD-10-CM | POA: Diagnosis not present

## 2018-05-12 DIAGNOSIS — R531 Weakness: Secondary | ICD-10-CM | POA: Diagnosis not present

## 2018-05-12 DIAGNOSIS — F419 Anxiety disorder, unspecified: Secondary | ICD-10-CM | POA: Diagnosis not present

## 2018-05-12 DIAGNOSIS — I129 Hypertensive chronic kidney disease with stage 1 through stage 4 chronic kidney disease, or unspecified chronic kidney disease: Secondary | ICD-10-CM | POA: Diagnosis not present

## 2018-05-12 NOTE — Progress Notes (Signed)
Patient states she gets SOB at times.  Also has nausea if she is coughing a lot.  Otherwise, offers no complaints.

## 2018-05-12 NOTE — Progress Notes (Signed)
[facility] day:  05/12/18  Chief Complaint: Nicole Bishop is a 84 y.o. female with stage III chronic kidney disease and iron deficiency who is seen for 6 month assessment.  HPI: The patient was last seen in the medical oncology clinic on 11/11/2017.  At that time, she felt weak and tired.  She had no acute physical concerns.  Exam revealed edema in her feet.  Hemoglobin was 11.9, hematocrit 36.0, MCV 84.7, and platelets 215,000. BUN was 28 and creatinine 1.56 (CrCl 38.7 mL/min).  Ferritin was 41.  We discussed follow-up with Dr. Vira Agar for a capsule study, however patient has not been seen in follow up consult.   Labs have been followed: 02/03/2018:  Hematocrit 35.8, hemoglobin 11.9, and MCV 85.4.  Ferritin 32.  B12 was 1013.  Folate 10.8. 05/11/2018:  Hematocrit 31.3, hemoglobin 10.3, and MCV 85.5.  Ferritin 132.  She received Venofer 200 mg on 02/06/2018 and 02/13/2018.  During the interim, patient is fatigued. Patient has lower extremity edema. PCP has put patient on furosemide and oral KCl. Patient's daughter noted that she continued the diuretics for a month. She developed profound weakness and difficulty walking. She became dehydrated and ended up in the ED. Furosemide and amlodipine discontinued. Patient has been seen by cardiology Josefa Half, MD) and advised that "there is nothing wrong with the heart". She remains on apixaban for A.fib. Patient is scheduled to follow up with nephrology Holley Raring, MD) on 05/14/2018.  Patient denies that she has experienced any B symptoms. She denies any interval infections. Patient denies bleeding; no hematochezia, melena, or gross hematuria. Patient advises that she maintains an adequate appetite. She is eating well. Weight today is 266 lb (120.7 kg), which compared to her last visit to the clinic, represents a 4 pound increase.  Patient continues on daily oral B12 supplementation.   Patient is having more  difficulty with ambulation. Her daughter states, "I think she would benefit from some physical therapy". Patient slow moving and noted to experience difficulties with transfer from wheelchair to standing position. Patient requires contact guard assistance for stability when standing. Patient with progressive signs of debility. She spends > 50% of her day "resting". She tires easily with performance of ADLs, however she can perform basic tasks independently.   Patient denies pain in the clinic today.   Past Medical History:  Diagnosis Date  . Anemia   . Anxiety   . Arthritis   . Chronic kidney disease   . Coronary artery disease   . Depression   . Dizziness   . Dyspnea   . Dysrhythmia    gets tachy if anxious or excited  . GERD (gastroesophageal reflux disease)   . History of hiatal hernia   . HOH (hard of hearing)   . Hypertension   . Hypothyroidism   . IDA (iron deficiency anemia) 12/14/2014  . Myocardial infarction ([place]) 1992  . Renal insufficiency   . Sleep apnea   . Tremors of nervous system     Past Surgical History:  Procedure Laterality Date  . CATARACT EXTRACTION W/PHACO Left 07/15/2017   Procedure: CATARACT EXTRACTION PHACO AND INTRAOCULAR LENS PLACEMENT (IOC)-LEFT;  Surgeon: Birder Robson, MD;  Location: [facility] ORS;  Service: Ophthalmology;  Laterality: Left;  [place] 00.36.1 AP% 13.9 CDE 5.02 Fluid Pack lot # [id number]  . CATARACT EXTRACTION W/PHACO Right 08/26/2017   Procedure: CATARACT EXTRACTION PHACO AND INTRAOCULAR LENS PLACEMENT (IOC);  Surgeon: Birder Robson, MD;  Location: [facility]  ORS;  Service: Ophthalmology;  Laterality: Right;  [place] 00:41.0 AP% 15.3 CDE 6.27 FLUID PACK LOT # [id number]  . CHOLECYSTECTOMY    . COLONOSCOPY WITH PROPOFOL N/A 04/10/2015   Procedure: COLONOSCOPY WITH PROPOFOL;  Surgeon: Manya Silvas, MD;  Location: [facility] ENDOSCOPY;  Service: Endoscopy;  Laterality: N/A;  . ESOPHAGOGASTRODUODENOSCOPY (EGD) WITH PROPOFOL N/A 04/10/2015   Procedure:  ESOPHAGOGASTRODUODENOSCOPY (EGD) WITH PROPOFOL;  Surgeon: Manya Silvas, MD;  Location: [facility] ENDOSCOPY;  Service: Endoscopy;  Laterality: N/A;  . fatty tumor removed on left shoulder    . gallbladdedr removed    . JOINT REPLACEMENT Bilateral    total knee replacements  . REPLACEMENT TOTAL KNEE BILATERAL Bilateral     Family History  Problem Relation Age of Onset  . Arthritis Mother   . Hypertension Mother   . Stroke Mother   . Hypertension Father   . Heart attack Father   . Breast cancer Neg Hx     Social History:  reports that she has never smoked. She has never used smokeless tobacco. She reports that she does not drink alcohol or use drugs. The patient lives in [place].  She is accompanied by her daughter, [name]: phone [phone] and cell [phone].  Allergies:  Allergies  Allergen Reactions  . Penicillins Rash and Other (See Comments)    Has patient had a PCN reaction causing immediate rash, facial/tongue/throat swelling, SOB or lightheadedness with hypotension: Unknown Has patient had a PCN reaction causing severe rash involving mucus membranes or skin necrosis: Unknown Has patient had a PCN reaction that required hospitalization: Unknown Has patient had a PCN reaction occurring within the last 10 years: Unknown If all of the above answers are "NO", then may proceed with Cephalosporin use.   . Nsaids Other (See Comments)    GI upset  . Atorvastatin Rash  . Celebrex [Celecoxib] Other (See Comments)    Constipation and stomach upset  . Felodipine Nausea Only  . Oxaprozin Nausea Only  . Rofecoxib Other (See Comments)    GI upset  . Simvastatin Rash    Current Medications: Current Outpatient Medications  Medication Sig Dispense Refill  . apixaban (ELIQUIS) 5 MG TABS tablet Take 1 tablet (5 mg total) by mouth 2 (two) times daily. 60 tablet 0  . calcitRIOL (ROCALTROL) 0.25 MCG capsule Take 1 capsule by mouth daily.    . Cholecalciferol (VITAMIN D3) 2000  units capsule Take 4,000 Units by mouth daily.    Marland Kitchen docusate sodium (COLACE) 100 MG capsule Take 100 mg by mouth daily.     Marland Kitchen esomeprazole (NEXIUM) 40 MG capsule Take 40 mg by mouth daily as needed (acid reflux).     . Ferrous Sulfate (SLOW FE) 142 (45 Fe) MG TBCR Take 1 tablet by mouth daily.    Marland Kitchen lactulose (CONSTULOSE) 10 GM/15ML solution     . loratadine (CLARITIN) 10 MG tablet Take 1 tablet (10 mg total) by mouth daily. 30 tablet 0  . metoprolol succinate (TOPROL-XL) 25 MG 24 hr tablet Take by mouth.    . mometasone (NASONEX) 50 MCG/ACT nasal spray Place 2 sprays into the nose daily.      Marland Kitchen olopatadine (PATANOL) 0.1 % ophthalmic solution Place 1 drop into both eyes 2 (two) times daily. At 6-8 hours intervals     . polyethylene glycol (MIRALAX / GLYCOLAX) packet Take 17 g by mouth daily as needed for mild constipation or moderate constipation.    . traMADol-acetaminophen (ULTRACET) 37.5-325 MG tablet Take  1 tablet by mouth daily as needed for moderate pain.     . vitamin B-12 (CYANOCOBALAMIN) 1000 MCG tablet Take 2,000 mcg by mouth daily.    . cyanocobalamin (,VITAMIN B-12,) 1000 MCG/ML injection Inject into the muscle.      No current facility-administered medications for this visit.    Facility-Administered Medications Ordered in Other Visits  Medication Dose Route Frequency Provider Last Rate Last Dose  . 0.9 %  sodium chloride infusion   Intravenous Continuous Lequita Asal, MD   Stopped at 02/06/18 1429    Review of Systems  Constitutional: Positive for malaise/fatigue. Negative for diaphoresis, fever and weight loss (up 4 pounds).       Progressive debility  HENT: Negative.   Eyes: Negative.   Respiratory: Negative for cough, hemoptysis, sputum production and shortness of breath.   Cardiovascular: Positive for leg swelling. Negative for chest pain, palpitations, orthopnea and PND.       PMH (+) for A.fib. Known infrarenal aneurysm.  Gastrointestinal: Negative for abdominal  pain, blood in stool, constipation, diarrhea, melena, nausea and vomiting.  Genitourinary: Negative for dysuria, frequency, hematuria and urgency.       CKD - followed by nephrology  Musculoskeletal: Negative for back pain, falls, joint pain and myalgias.  Skin: Negative for itching and rash.  Neurological: Positive for weakness (generalized). Negative for dizziness, tremors and headaches.  Endo/Heme/Allergies: Does not bruise/bleed easily.  Psychiatric/Behavioral: Negative for depression, memory loss and suicidal ideas. The patient is not nervous/anxious and does not have insomnia.   All other systems reviewed and are negative.  Performance status (ECOG): 1 - Symptomatic but completely ambulatory  Vital Signs: BP (!) 161/86 Comment: Recheck  Pulse 65 Comment: Recheck  Temp 98.1 F (36.7 C) (Tympanic)   Wt 266 lb (120.7 kg)   BMI 36.08 kg/m   Physical Exam  Constitutional: She is oriented to person, place, and time and well-developed, well-nourished, and in no distress.  Elderly woman sitting comfortably in a wheelchair in no acute distress.  She needs assistance onto the exam table.  HENT:  Head: Normocephalic and atraumatic.  Gray hair  Eyes: Pupils are equal, round, and reactive to light. EOM are normal. No scleral icterus.  Brown eyes. Bilateral arcus senilis   Neck: Normal range of motion. Neck supple. No tracheal deviation present. No thyromegaly present.  Cardiovascular: Normal rate, regular rhythm and normal heart sounds. Exam reveals no gallop and no friction rub.  No murmur heard. Pulmonary/Chest: Effort normal and breath sounds normal. No respiratory distress. She has no wheezes. She has no rales.  Abdominal: Soft. Bowel sounds are normal. She exhibits no distension. There is no tenderness.  Musculoskeletal: Normal range of motion. She exhibits no tenderness.       Right ankle: She exhibits swelling.       Left ankle: She exhibits swelling.  Lymphadenopathy:    She  has no cervical adenopathy.    She has no axillary adenopathy.       Right: No inguinal and no supraclavicular adenopathy present.       Left: No inguinal and no supraclavicular adenopathy present.  Neurological: She is alert and oriented to person, place, and time.  Skin: Skin is warm and dry. No rash noted. No erythema.  Psychiatric: Mood, affect and judgment normal.  Nursing note and vitals reviewed.    Appointment on 05/11/2018  Component Date Value Ref Range Status  . WBC 05/11/2018 4.8  3.6 - 11.0 K/uL Final  .  RBC 05/11/2018 3.66* 3.80 - 5.20 MIL/uL Final  . Hemoglobin 05/11/2018 10.3* 12.0 - 16.0 g/dL Final  . HCT 05/11/2018 31.3* 35.0 - 47.0 % Final  . MCV 05/11/2018 85.5  80.0 - 100.0 fL Final  . MCH 05/11/2018 28.2  26.0 - 34.0 pg Final  . MCHC 05/11/2018 33.0  32.0 - 36.0 g/dL Final  . RDW 05/11/2018 14.3  11.5 - 14.5 % Final  . Platelets 05/11/2018 268  150 - 440 K/uL Final  . Neutrophils Relative % 05/11/2018 64  % Final  . Neutro Abs 05/11/2018 3.0  1.4 - 6.5 K/uL Final  . Lymphocytes Relative 05/11/2018 23  % Final  . Lymphs Abs 05/11/2018 1.1  1.0 - 3.6 K/uL Final  . Monocytes Relative 05/11/2018 10  % Final  . Monocytes Absolute 05/11/2018 0.5  0.2 - 0.9 K/uL Final  . Eosinophils Relative 05/11/2018 2  % Final  . Eosinophils Absolute 05/11/2018 0.1  0 - 0.7 K/uL Final  . Basophils Relative 05/11/2018 1  % Final  . Basophils Absolute 05/11/2018 0.1  0 - 0.1 K/uL Final   Performed at [facility], [address] [place], [place] [address]  . Ferritin 05/11/2018 132  11 - 307 ng/mL Final   Performed at [facility], [address] [place], [place] [address]    Assessment:  Nicole Bishop is a 84 y.o. African American woman with stage III chronic kidney disease and iron deficiency.  Hematocrit and MCV were normal on 09/03/2013.    She denies any GI bleeding.  Diet is modest.  EGD on 04/10/2015 revealed a large hiatal hernia and gastritis.   Biopsy was unremarkable.  Colonoscopy on 04/10/2015 revealed diverticulosis in the sigmoid, descending, transverse, and ascending colon.  She had internal hemorrhoids.  She is B12 deficient.  She received monthly injections at the [facility] (last 07/2016).  She began oral B12 in 08/2016.  B12 and folate were normal on 02/03/2018.  She is intolerant of oral iron secondary to baseline chronic constipation.   Labs on 09/12/2014 revealed a hematocrit of 30 with an MCV of 67. Ferritin was 6 (low) with a TIBC of 487 (high).  Normal studies included direct Co[place], folic acid, X32, haptoglobin, erythropoietin, and SPEP.    She has received weekly Venofer:  800 mg (10/21/2014 - 11/11/2014), 400 mg (03/03/2015 and 03/10/2015), 400 mg (10/20/2015 and 10/27/2015), 200 mg (04/16/2016), 600 mg (11/11/2016 - 11/25/2016), 600 mg (05/23/2018 - 06/06/2018), and 400 mg (02/06/2018 - 02/13/2018).  She receives Venofer if her ferritin is < 30.    Ferritin has been followed:  14 on 12/28/2014, 9 on 02/23/2015, 113 on 03/17/2015, 12 on 10/13/2015, 31 on 01/10/2016, 19 on 04/11/2016, 23 on 07/18/2016, 9 on 11/11/2016, 9 on 05/13/2017, 46 on 08/07/2017, 41 on 11/10/2017, 32 on 02/03/2018, and 132 on 05/11/2018.  Goal ferritin is 100.  Symptomatically, she remains fatigued. She has signs of progressive debility.  She has BILATERAL ankle edema.  Exam otherwise unremarkable. Hemoglobin 10.3, hematocrit 31.3, MCV 85.5, and platelets 268,000. Ferritin 132.   Plan: 1.  Stage III chronic kidney disease:  Creatinine was 1.49 on 04/30/2018 (CrCl 39.7 ml/min).  If iron stores adequate and no other cause of anemia, may require support (Procrit) if hemoglobin drifts below 10.  Scheduled to follow up with nephrology on 05/14/2018. 2.  Recurrent iron deficiency anemia:  Labs from yesterday reveal progressive normocytic anemia.  Discuss follow-up with GI. Schedule follow up with Dr. Vira Agar.  Iron stores appear adequate.   Ferritin 132. ? component of inflammation falsely increasing ferritin.  No Venofer infusion required today.  3.  B12 deficiency:  B12 and folate levels were normal on 02/03/2018.  Continue oral B12. 4.  Infrarenal aneurysm:  Noted on abdomen and pelvic CT 01/19/2017.  Follow-up ultrasound recommended in 1 year (overdue). Will defer ordering to PCP.  5.  RTC in 1 month for MD assessment, labs (CBC with diff, retic, Cr, ferritin, iron studies, sed rate- day before), and +/- Venofer.   Honor Loh, NP  05/12/2018, 11:49 AM   I saw and evaluated the patient, participating in the key portions of the service and reviewing pertinent diagnostic studies and records.  I reviewed the nurse practitioner's note and agree with the findings and the plan.  The assessment and plan were discussed with the patient.  A few questions were asked by the patient and answered.   Lequita Asal, MD  05/12/2018, 11:49 AM

## 2018-05-14 DIAGNOSIS — I10 Essential (primary) hypertension: Secondary | ICD-10-CM | POA: Diagnosis not present

## 2018-05-14 DIAGNOSIS — N2581 Secondary hyperparathyroidism of renal origin: Secondary | ICD-10-CM | POA: Diagnosis not present

## 2018-05-14 DIAGNOSIS — D631 Anemia in chronic kidney disease: Secondary | ICD-10-CM | POA: Diagnosis not present

## 2018-05-14 DIAGNOSIS — R600 Localized edema: Secondary | ICD-10-CM | POA: Diagnosis not present

## 2018-05-14 DIAGNOSIS — N183 Chronic kidney disease, stage 3 (moderate): Secondary | ICD-10-CM | POA: Diagnosis not present

## 2018-05-18 DIAGNOSIS — R600 Localized edema: Secondary | ICD-10-CM | POA: Diagnosis not present

## 2018-05-18 DIAGNOSIS — N2581 Secondary hyperparathyroidism of renal origin: Secondary | ICD-10-CM | POA: Diagnosis not present

## 2018-05-18 DIAGNOSIS — N182 Chronic kidney disease, stage 2 (mild): Secondary | ICD-10-CM | POA: Diagnosis not present

## 2018-05-18 DIAGNOSIS — I10 Essential (primary) hypertension: Secondary | ICD-10-CM | POA: Diagnosis not present

## 2018-05-19 DIAGNOSIS — I10 Essential (primary) hypertension: Secondary | ICD-10-CM | POA: Diagnosis not present

## 2018-05-19 DIAGNOSIS — I5032 Chronic diastolic (congestive) heart failure: Secondary | ICD-10-CM | POA: Diagnosis not present

## 2018-05-19 DIAGNOSIS — E782 Mixed hyperlipidemia: Secondary | ICD-10-CM | POA: Diagnosis not present

## 2018-05-19 DIAGNOSIS — K219 Gastro-esophageal reflux disease without esophagitis: Secondary | ICD-10-CM | POA: Diagnosis not present

## 2018-05-19 DIAGNOSIS — R0602 Shortness of breath: Secondary | ICD-10-CM | POA: Diagnosis not present

## 2018-05-26 DIAGNOSIS — D508 Other iron deficiency anemias: Secondary | ICD-10-CM | POA: Diagnosis not present

## 2018-06-08 ENCOUNTER — Observation Stay
Admission: EM | Admit: 2018-06-08 | Discharge: 2018-06-12 | Disposition: A | Discharge status: Skilled Nursing Facility | Payer: Medicare HMO | Attending: Internal Medicine | Admitting: Internal Medicine

## 2018-06-08 ENCOUNTER — Other Ambulatory Visit: Payer: Self-pay

## 2018-06-08 ENCOUNTER — Emergency Department: Payer: Medicare HMO

## 2018-06-08 ENCOUNTER — Encounter: Payer: Self-pay | Admitting: Intensive Care

## 2018-06-08 DIAGNOSIS — W010XXA Fall on same level from slipping, tripping and stumbling without subsequent striking against object, initial encounter: Secondary | ICD-10-CM | POA: Diagnosis not present

## 2018-06-08 DIAGNOSIS — I252 Old myocardial infarction: Secondary | ICD-10-CM | POA: Diagnosis not present

## 2018-06-08 DIAGNOSIS — D509 Iron deficiency anemia, unspecified: Secondary | ICD-10-CM | POA: Diagnosis not present

## 2018-06-08 DIAGNOSIS — M1990 Unspecified osteoarthritis, unspecified site: Secondary | ICD-10-CM | POA: Insufficient documentation

## 2018-06-08 DIAGNOSIS — W19XXXA Unspecified fall, initial encounter: Secondary | ICD-10-CM

## 2018-06-08 DIAGNOSIS — Y92009 Unspecified place in unspecified non-institutional (private) residence as the place of occurrence of the external cause: Secondary | ICD-10-CM | POA: Diagnosis not present

## 2018-06-08 DIAGNOSIS — S8002XA Contusion of left knee, initial encounter: Secondary | ICD-10-CM | POA: Diagnosis not present

## 2018-06-08 DIAGNOSIS — F419 Anxiety disorder, unspecified: Secondary | ICD-10-CM | POA: Diagnosis not present

## 2018-06-08 DIAGNOSIS — Z8249 Family history of ischemic heart disease and other diseases of the circulatory system: Secondary | ICD-10-CM | POA: Insufficient documentation

## 2018-06-08 DIAGNOSIS — Z888 Allergy status to other drugs, medicaments and biological substances status: Secondary | ICD-10-CM | POA: Insufficient documentation

## 2018-06-08 DIAGNOSIS — M25562 Pain in left knee: Secondary | ICD-10-CM | POA: Diagnosis not present

## 2018-06-08 DIAGNOSIS — S99911A Unspecified injury of right ankle, initial encounter: Secondary | ICD-10-CM | POA: Diagnosis not present

## 2018-06-08 DIAGNOSIS — I82409 Acute embolism and thrombosis of unspecified deep veins of unspecified lower extremity: Secondary | ICD-10-CM | POA: Diagnosis present

## 2018-06-08 DIAGNOSIS — F05 Delirium due to known physiological condition: Secondary | ICD-10-CM | POA: Diagnosis not present

## 2018-06-08 DIAGNOSIS — F329 Major depressive disorder, single episode, unspecified: Secondary | ICD-10-CM | POA: Diagnosis not present

## 2018-06-08 DIAGNOSIS — G4730 Sleep apnea, unspecified: Secondary | ICD-10-CM | POA: Diagnosis not present

## 2018-06-08 DIAGNOSIS — I82412 Acute embolism and thrombosis of left femoral vein: Secondary | ICD-10-CM | POA: Diagnosis not present

## 2018-06-08 DIAGNOSIS — I2510 Atherosclerotic heart disease of native coronary artery without angina pectoris: Secondary | ICD-10-CM | POA: Insufficient documentation

## 2018-06-08 DIAGNOSIS — I129 Hypertensive chronic kidney disease with stage 1 through stage 4 chronic kidney disease, or unspecified chronic kidney disease: Secondary | ICD-10-CM | POA: Diagnosis not present

## 2018-06-08 DIAGNOSIS — M25561 Pain in right knee: Secondary | ICD-10-CM | POA: Diagnosis not present

## 2018-06-08 DIAGNOSIS — N183 Chronic kidney disease, stage 3 unspecified: Secondary | ICD-10-CM | POA: Diagnosis present

## 2018-06-08 DIAGNOSIS — E538 Deficiency of other specified B group vitamins: Secondary | ICD-10-CM | POA: Insufficient documentation

## 2018-06-08 DIAGNOSIS — Z7901 Long term (current) use of anticoagulants: Secondary | ICD-10-CM | POA: Insufficient documentation

## 2018-06-08 DIAGNOSIS — M7989 Other specified soft tissue disorders: Secondary | ICD-10-CM | POA: Diagnosis present

## 2018-06-08 DIAGNOSIS — S8001XA Contusion of right knee, initial encounter: Secondary | ICD-10-CM | POA: Diagnosis not present

## 2018-06-08 DIAGNOSIS — K219 Gastro-esophageal reflux disease without esophagitis: Secondary | ICD-10-CM | POA: Diagnosis present

## 2018-06-08 DIAGNOSIS — M25571 Pain in right ankle and joints of right foot: Secondary | ICD-10-CM | POA: Diagnosis not present

## 2018-06-08 DIAGNOSIS — I4891 Unspecified atrial fibrillation: Secondary | ICD-10-CM | POA: Diagnosis not present

## 2018-06-08 DIAGNOSIS — Z880 Allergy status to penicillin: Secondary | ICD-10-CM | POA: Insufficient documentation

## 2018-06-08 DIAGNOSIS — Z79899 Other long term (current) drug therapy: Secondary | ICD-10-CM | POA: Diagnosis not present

## 2018-06-08 DIAGNOSIS — Z96653 Presence of artificial knee joint, bilateral: Secondary | ICD-10-CM | POA: Insufficient documentation

## 2018-06-08 DIAGNOSIS — E559 Vitamin D deficiency, unspecified: Secondary | ICD-10-CM | POA: Diagnosis not present

## 2018-06-08 DIAGNOSIS — I82411 Acute embolism and thrombosis of right femoral vein: Principal | ICD-10-CM | POA: Insufficient documentation

## 2018-06-08 DIAGNOSIS — F32A Depression, unspecified: Secondary | ICD-10-CM | POA: Diagnosis present

## 2018-06-08 DIAGNOSIS — N1831 Chronic kidney disease, stage 3a: Secondary | ICD-10-CM | POA: Diagnosis present

## 2018-06-08 DIAGNOSIS — S8991XA Unspecified injury of right lower leg, initial encounter: Secondary | ICD-10-CM | POA: Diagnosis not present

## 2018-06-08 DIAGNOSIS — M25569 Pain in unspecified knee: Secondary | ICD-10-CM | POA: Diagnosis not present

## 2018-06-08 DIAGNOSIS — I10 Essential (primary) hypertension: Secondary | ICD-10-CM | POA: Diagnosis present

## 2018-06-08 DIAGNOSIS — Z886 Allergy status to analgesic agent status: Secondary | ICD-10-CM | POA: Insufficient documentation

## 2018-06-08 DIAGNOSIS — S8992XA Unspecified injury of left lower leg, initial encounter: Secondary | ICD-10-CM | POA: Diagnosis not present

## 2018-06-08 DIAGNOSIS — M25511 Pain in right shoulder: Secondary | ICD-10-CM

## 2018-06-08 LAB — URINALYSIS, COMPLETE (UACMP) WITH MICROSCOPIC
BACTERIA UA: NONE SEEN
Bilirubin Urine: NEGATIVE
Glucose, UA: NEGATIVE mg/dL
KETONES UR: NEGATIVE mg/dL
Nitrite: NEGATIVE
PROTEIN: NEGATIVE mg/dL
Specific Gravity, Urine: 1.012 (ref 1.005–1.030)
pH: 5 (ref 5.0–8.0)

## 2018-06-08 LAB — BASIC METABOLIC PANEL
ANION GAP: 5 (ref 5–15)
BUN: 23 mg/dL (ref 8–23)
CALCIUM: 9.4 mg/dL (ref 8.9–10.3)
CO2: 25 mmol/L (ref 22–32)
Chloride: 110 mmol/L (ref 98–111)
Creatinine, Ser: 1.09 mg/dL — ABNORMAL HIGH (ref 0.44–1.00)
GFR, EST AFRICAN AMERICAN: 52 mL/min — AB (ref >60)
GFR, EST NON AFRICAN AMERICAN: 45 mL/min — AB (ref >60)
Glucose, Bld: 100 mg/dL — ABNORMAL HIGH (ref 70–99)
Potassium: 4.2 mmol/L (ref 3.5–5.1)
SODIUM: 140 mmol/L (ref 135–145)

## 2018-06-08 LAB — CBC
HCT: 34.3 % — ABNORMAL LOW (ref 36.0–46.0)
Hemoglobin: 10.6 g/dL — ABNORMAL LOW (ref 12.0–15.0)
MCH: 27.2 pg (ref 26.0–34.0)
MCHC: 30.9 g/dL (ref 30.0–36.0)
MCV: 87.9 fL (ref 80.0–100.0)
NRBC: 0 % (ref 0.0–0.2)
PLATELETS: 213 10*3/uL (ref 150–400)
RBC: 3.9 MIL/uL (ref 3.87–5.11)
RDW: 14.8 % (ref 11.5–15.5)
WBC: 4.6 10*3/uL (ref 4.0–10.5)

## 2018-06-08 MED ORDER — SODIUM CHLORIDE 0.9 % IV BOLUS
1000.0000 mL | Freq: Once | INTRAVENOUS | Status: AC
  Administered 2018-06-08: 1000 mL via INTRAVENOUS

## 2018-06-08 NOTE — ED Triage Notes (Signed)
Patient arrived by EMS from [facility]. Patient reports she tripped over her dog and fell. C/o pain in bilateral knees. HX bilateral knee pain. Patient reports she fell forward to the ground and did not hit her head.

## 2018-06-08 NOTE — ED Provider Notes (Signed)
[place] EMERGENCY DEPARTMENT Provider Note   CSN: [id number] Arrival date & time: 06/08/18  1818     History   Chief Complaint Chief Complaint  Patient presents with  . Fall    HPI Nicole Bishop is a 84 y.o. female.  Presents to the emergency department for evaluation of bilateral knee pain.  Patient presents by EMS from home facility after stating she tripped over an object in her house and fell to both of her knees just prior to arrival.  Patient has a history of bilateral total knee replacements performed 10 to 15 years ago.  At baseline she ambulates with a walker at home.  Daughter states she was running because she saw something in her kitchen that scared her, patient fell to her knees.  Daughter states she was hallucinating seeing a scary chicken on top of her refrigerator.  Daughter states there actually is a chicken on top of her refrigerator that looks real.  Daughter states patient has been hallucinating on and off since July, has been evaluated by psychiatrist, PCP.  Hallucinations have been thought to be due to her underlying dementia as well as sleep apnea.  Currently daughter state patient is without any signs of confusion.  Patient denies any other pain throughout her body except for her right leg, left and right knee and right ankle.  She has been ambulatory since the accident but with pain in both knees in the right ankle.  She has not had any medications for pain and does not feel that she needs anything for pain.  She denies any neck pain, headache, head trauma, upper extremity discomfort, chest pain, shortness of breath or lower back pain.  No radicular symptoms throughout the lower extremities.  Daughter notes she has noticed swelling in the patient's right leg and foot for 1 week.  HPI  Past Medical History:  Diagnosis Date  . Anemia   . Anxiety   . Arthritis   . Chronic kidney disease   . Coronary artery disease   . Depression   .  Dizziness   . Dyspnea   . Dysrhythmia    gets tachy if anxious or excited  . GERD (gastroesophageal reflux disease)   . History of hiatal hernia   . HOH (hard of hearing)   . Hypertension   . Hypothyroidism   . IDA (iron deficiency anemia) 12/14/2014  . Myocardial infarction ([place]) 1992  . Renal insufficiency   . Sleep apnea   . Tremors of nervous system     Patient Active Problem List   Diagnosis Date Noted  . Visual hallucinations 07/16/2017  . SOB (shortness of breath) on exertion 10/30/2016  . Depression, major, single episode, complete remission ([place]) 08/03/2016  . Kidney failure 03/17/2015  . Chronic kidney disease (CKD), stage III (moderate) ([place]) 12/22/2014  . Anemia, iron deficiency 12/22/2014  . IDA (iron deficiency anemia) 12/14/2014  . Chronic constipation 06/03/2014  . Essential (primary) hypertension 05/03/2014  . Acid reflux 04/20/2014  . H/O: obesity 04/20/2014  . Airway hyperreactivity 04/20/2014  . Arthritis of shoulder region, degenerative 01/27/2014  . B12 deficiency 12/27/2013  . Dizziness 03/13/2012  . Back ache 11/04/2011  . Avitaminosis D 11/04/2011  . Combined fat and carbohydrate induced hyperlipemia 10/31/2011  . Absolute anemia 10/31/2011  . Hypertension 05/21/2011  . Depression 05/21/2011  . Hearing loss 05/21/2011    Past Surgical History:  Procedure Laterality Date  . CATARACT EXTRACTION W/PHACO Left 07/15/2017   Procedure:  CATARACT EXTRACTION PHACO AND INTRAOCULAR LENS PLACEMENT (IOC)-LEFT;  Surgeon: Birder Robson, MD;  Location: [facility] ORS;  Service: Ophthalmology;  Laterality: Left;  [place] 00.36.1 AP% 13.9 CDE 5.02 Fluid Pack lot # [id number]  . CATARACT EXTRACTION W/PHACO Right 08/26/2017   Procedure: CATARACT EXTRACTION PHACO AND INTRAOCULAR LENS PLACEMENT (IOC);  Surgeon: Birder Robson, MD;  Location: [facility] ORS;  Service: Ophthalmology;  Laterality: Right;  [place] 00:41.0 AP% 15.3 CDE 6.27 FLUID PACK LOT # [id number]  . CHOLECYSTECTOMY     . COLONOSCOPY WITH PROPOFOL N/A 04/10/2015   Procedure: COLONOSCOPY WITH PROPOFOL;  Surgeon: Manya Silvas, MD;  Location: [facility] ENDOSCOPY;  Service: Endoscopy;  Laterality: N/A;  . ESOPHAGOGASTRODUODENOSCOPY (EGD) WITH PROPOFOL N/A 04/10/2015   Procedure: ESOPHAGOGASTRODUODENOSCOPY (EGD) WITH PROPOFOL;  Surgeon: Manya Silvas, MD;  Location: [facility] ENDOSCOPY;  Service: Endoscopy;  Laterality: N/A;  . fatty tumor removed on left shoulder    . gallbladdedr removed    . JOINT REPLACEMENT Bilateral    total knee replacements  . REPLACEMENT TOTAL KNEE BILATERAL Bilateral      OB History   None      Home Medications    Prior to Admission medications   Medication Sig Start Date End Date Taking? Authorizing Provider  apixaban (ELIQUIS) 5 MG TABS tablet Take 1 tablet (5 mg total) by mouth 2 (two) times daily. 10/16/17   Merlyn Lot, MD  calcitRIOL (ROCALTROL) 0.25 MCG capsule Take 1 capsule by mouth daily. 09/25/17   [provider]  Cholecalciferol (VITAMIN D3) 2000 units capsule Take 4,000 Units by mouth daily.    [provider]  cyanocobalamin (,VITAMIN B-12,) 1000 MCG/ML injection Inject into the muscle.  12/27/13   [provider]  docusate sodium (COLACE) 100 MG capsule Take 100 mg by mouth daily.  06/03/14   [provider]  esomeprazole (NEXIUM) 40 MG capsule Take 40 mg by mouth daily as needed (acid reflux).     [provider]  Ferrous Sulfate (SLOW FE) 142 (45 Fe) MG TBCR Take 1 tablet by mouth daily.    [provider]  lactulose (CONSTULOSE) 10 GM/15ML solution  04/01/16   [provider]  loratadine (CLARITIN) 10 MG tablet Take 1 tablet (10 mg total) by mouth daily. 02/12/13   Jackolyn Confer, MD  metoprolol succinate (TOPROL-XL) 25 MG 24 hr tablet Take by mouth. 10/29/17 10/29/18  [provider]  mometasone (NASONEX) 50 MCG/ACT nasal spray Place 2 sprays into the nose daily.      [provider]  olopatadine (PATANOL) 0.1 % ophthalmic solution Place 1 drop into both eyes 2 (two) times daily. At 6-8 hours intervals     [provider]  polyethylene glycol (MIRALAX / GLYCOLAX) packet Take 17 g by mouth daily as needed for mild constipation or moderate constipation.    [provider]  traMADol-acetaminophen (ULTRACET) 37.5-325 MG tablet Take 1 tablet by mouth daily as needed for moderate pain.  05/01/15   [provider]  vitamin B-12 (CYANOCOBALAMIN) 1000 MCG tablet Take 2,000 mcg by mouth daily.    [provider]    Family History Family History  Problem Relation Age of Onset  . Arthritis Mother   . Hypertension Mother   . Stroke Mother   . Hypertension Father   . Heart attack Father   . Breast cancer Neg Hx     Social History Social History   Tobacco Use  . Smoking status: Never Smoker  . Smokeless  tobacco: Never Used  Substance Use Topics  . Alcohol use: No  . Drug use: No     Allergies   Penicillins; Nsaids; Atorvastatin; Celebrex [celecoxib]; Felodipine; Oxaprozin; Rofecoxib; and Simvastatin   Review of Systems Review of Systems  Constitutional: Negative for fever.  Respiratory: Negative for shortness of breath.   Cardiovascular: Negative for chest pain and leg swelling.  Gastrointestinal: Negative for nausea and vomiting.  Musculoskeletal: Positive for arthralgias and joint swelling. Negative for back pain, gait problem, myalgias and neck stiffness.  Skin: Negative for rash and wound.  Neurological: Negative for dizziness, speech difficulty, weakness, light-headedness and headaches.  Psychiatric/Behavioral: Positive for hallucinations (Daughter states this is her baseline and has been worked up by PCP, psychologist and nephrologist.). Negative for confusion.     Physical Exam Updated Vital Signs BP (!) 175/88 (BP Location: Right Arm)   Pulse 66   Temp 98.4 F (36.9 C) (Oral)   Resp 20   Ht 6\' 1"  (1.854 m)   Wt  (!) 147.9 kg   SpO2 96%   BMI 43.01 kg/m   Physical Exam  Constitutional: She is oriented to person, place, and time. She appears well-developed and well-nourished. No distress.  HENT:  Head: Normocephalic and atraumatic.  Mouth/Throat: Oropharynx is clear and moist.  Eyes: Pupils are equal, round, and reactive to light. EOM are normal. Right eye exhibits no discharge. Left eye exhibits no discharge.  Neck: Normal range of motion. Neck supple.  Cardiovascular: Normal rate, regular rhythm and intact distal pulses.  Pulmonary/Chest: Effort normal and breath sounds normal. No respiratory distress. She exhibits no tenderness.  Abdominal: Soft. She exhibits no distension. There is no tenderness.  Musculoskeletal: Normal range of motion.  Examination of bilateral lower extremities show patient has mild swelling throughout the right lower leg and ankle with mild swelling in the right foot.  Slight increase in swelling in the right leg when compared to the left.  2+ dorsalis pedis pulses are intact bilaterally.  Patient is tender throughout the right ankle as well as both anterior knees along the patellas.  She is able to straight leg raise bilaterally.  She has full range of motion of both hips with no discomfort.  She is neurovascular intact in bilateral lower extremity's.  She has no tenderness on the cervical thoracic and lumbar spinous process.  Neurological: She is alert and oriented to person, place, and time. She has normal reflexes. No cranial nerve deficit. Coordination normal.  Skin: Skin is warm and dry.  Psychiatric: She has a normal mood and affect. Her behavior is normal. Thought content normal.     ED Treatments / Results  Labs (all labs ordered are listed, but only abnormal results are displayed) Labs Reviewed  URINALYSIS, COMPLETE (UACMP) WITH MICROSCOPIC - Abnormal; Notable for the following components:      Result Value   Color, Urine YELLOW (*)    APPearance HAZY (*)     Hgb urine dipstick SMALL (*)    Leukocytes, UA SMALL (*)    All other components within normal limits  BASIC METABOLIC PANEL - Abnormal; Notable for the following components:   Glucose, Bld 100 (*)    Creatinine, Ser 1.09 (*)    GFR calc non Af Amer 45 (*)    GFR calc Af Amer 52 (*)    All other components within normal limits  CBC - Abnormal; Notable for the following components:   Hemoglobin 10.6 (*)    HCT 34.3 (*)  All other components within normal limits    EKG None  Radiology Dg Ankle Complete Right  Result Date: 06/08/2018 CLINICAL DATA:  Ankle pain after fall EXAM: RIGHT ANKLE - COMPLETE 3+ VIEW COMPARISON:  None. FINDINGS: Diffuse soft tissue swelling. No fracture or malalignment. Small plantar calcaneal spur. Ankle mortise is symmetric. IMPRESSION: No acute osseous abnormality. Electronically Signed   By: Donavan Foil M.D.   On: 06/08/2018 21:37   US Venous Img Lower Unilateral Right  Result Date: 06/08/2018 CLINICAL DATA:  Right leg swelling for 1 day EXAM: RIGHT LOWER EXTREMITY VENOUS DOPPLER ULTRASOUND TECHNIQUE: Gray-scale sonography with graded compression, as well as color Doppler and duplex ultrasound were performed to evaluate the lower extremity deep venous systems from the level of the common femoral vein and including the common femoral, femoral, profunda femoral, popliteal and calf veins including the posterior tibial, peroneal and gastrocnemius veins when visible. The superficial great saphenous vein was also interrogated. Spectral Doppler was utilized to evaluate flow at rest and with distal augmentation maneuvers in the common femoral, femoral and popliteal veins. COMPARISON:  11/30/2014 FINDINGS: Contralateral Common Femoral Vein: Respiratory phasicity is normal and symmetric with the symptomatic side. No evidence of thrombus. Normal compressibility. Common Femoral Vein: No evidence of thrombus. Normal compressibility, respiratory phasicity and response to  augmentation. Saphenofemoral Junction: No evidence of thrombus. Normal compressibility and flow on color Doppler imaging. Profunda Femoral Vein: No evidence of thrombus. Normal compressibility and flow on color Doppler imaging. Femoral Vein: There is thrombus noted within the right femoral vein, occlusive in the proximal and mid femoral vein and nonocclusive in the distal femoral vein. Popliteal Vein: Nonocclusive thrombus noted in the popliteal vein. Calf Veins: Limited visualization, grossly unremarkable. Superficial Great Saphenous Vein: No evidence of thrombus. Normal compressibility. Venous Reflux:  None. Other Findings:  None. IMPRESSION: Occlusive thrombus noted within the proximal and mid right femoral vein with nonocclusive thrombus in the distal femoral vein and right popliteal vein. Electronically Signed   By: Rolm Baptise M.D.   On: 06/08/2018 23:48   Dg Knee Complete 4 Views Left  Result Date: 06/08/2018 CLINICAL DATA:  Fall, bilateral knee pain EXAM: LEFT KNEE - COMPLETE 4+ VIEW COMPARISON:  12/17/2008 FINDINGS: Changes of left knee replacement. No hardware complicating feature. No fracture, subluxation or dislocation. IMPRESSION: Prior left knee replacement. No complicating feature or acute bony abnormality. Electronically Signed   By: Rolm Baptise M.D.   On: 06/08/2018 20:14   Dg Knee Complete 4 Views Right  Result Date: 06/08/2018 CLINICAL DATA:  Fall, bilateral knee pain EXAM: RIGHT KNEE - COMPLETE 4+ VIEW COMPARISON:  12/17/2008 FINDINGS: Changes of right knee replacement. No hardware complicating feature. No acute bony abnormality. Specifically, no fracture, subluxation, or dislocation. No joint effusion. IMPRESSION: Right knee replacement. No complicating feature. No acute bony abnormality. Electronically Signed   By: Rolm Baptise M.D.   On: 06/08/2018 20:15    Procedures Procedures (including critical care time)  Medications Ordered in ED Medications  sodium chloride 0.9 %  bolus 1,000 mL (1,000 mLs Intravenous New Bag/Given 06/08/18 2110)     Initial Impression / Assessment and Plan / ED Course  I have reviewed the triage vital signs and the nursing notes.  Pertinent labs & imaging results that were available during my care of the patient were reviewed by me and considered in my medical decision making (see chart for details).     84 year old female with fall onto both knees earlier today.  No  head trauma.  Patient only complaint of pain to both knees and right ankle.  X-rays obtained today of both knees and right ankle show no evidence of acute bony ab normality.  Patient noted to have slight increase in swelling in the right leg, daughter noticed this is been present for 1 week.  Ultrasound of the right lower extremity showed new occlusive DVT.  Patient is already on Eliquis 5 mg twice daily for her A. fib.  Discussed case with hospitalist, patient will be admitted and vascular surgery consulted.  Final Clinical Impressions(s) / ED Diagnoses   Final diagnoses:  Leg swelling  Fall, initial encounter  Contusion of left knee, initial encounter  Contusion of right knee, initial encounter  Acute right ankle pain  Acute deep vein thrombosis (DVT) of right femoral vein [facility])    ED Discharge Orders    None       Renata Caprice 06/09/18 0011    Merlyn Lot, MD 06/11/18 1640

## 2018-06-08 NOTE — ED Notes (Signed)
Pt c/o redness to rt lower leg; warm to touch

## 2018-06-09 DIAGNOSIS — I2510 Atherosclerotic heart disease of native coronary artery without angina pectoris: Secondary | ICD-10-CM

## 2018-06-09 DIAGNOSIS — F329 Major depressive disorder, single episode, unspecified: Secondary | ICD-10-CM | POA: Diagnosis not present

## 2018-06-09 DIAGNOSIS — I82409 Acute embolism and thrombosis of unspecified deep veins of unspecified lower extremity: Secondary | ICD-10-CM | POA: Diagnosis present

## 2018-06-09 DIAGNOSIS — I82411 Acute embolism and thrombosis of right femoral vein: Secondary | ICD-10-CM | POA: Diagnosis not present

## 2018-06-09 DIAGNOSIS — I10 Essential (primary) hypertension: Secondary | ICD-10-CM

## 2018-06-09 DIAGNOSIS — K219 Gastro-esophageal reflux disease without esophagitis: Secondary | ICD-10-CM

## 2018-06-09 DIAGNOSIS — I824Z1 Acute embolism and thrombosis of unspecified deep veins of right distal lower extremity: Secondary | ICD-10-CM

## 2018-06-09 DIAGNOSIS — N183 Chronic kidney disease, stage 3 (moderate): Secondary | ICD-10-CM | POA: Diagnosis not present

## 2018-06-09 LAB — BASIC METABOLIC PANEL
Anion gap: 6 (ref 5–15)
BUN: 18 mg/dL (ref 8–23)
CO2: 24 mmol/L (ref 22–32)
Calcium: 9.1 mg/dL (ref 8.9–10.3)
Chloride: 112 mmol/L — ABNORMAL HIGH (ref 98–111)
Creatinine, Ser: 0.94 mg/dL (ref 0.44–1.00)
GFR calc Af Amer: >60 mL/min (ref >60)
GFR calc non Af Amer: 54 mL/min — ABNORMAL LOW (ref >60)
GLUCOSE: 94 mg/dL (ref 70–99)
POTASSIUM: 3.7 mmol/L (ref 3.5–5.1)
SODIUM: 142 mmol/L (ref 135–145)

## 2018-06-09 LAB — APTT
aPTT: 30 seconds (ref 24–36)
aPTT: 82 seconds — ABNORMAL HIGH (ref 24–36)

## 2018-06-09 LAB — CBC
HEMATOCRIT: 32.5 % — AB (ref 36.0–46.0)
Hemoglobin: 10 g/dL — ABNORMAL LOW (ref 12.0–15.0)
MCH: 27.2 pg (ref 26.0–34.0)
MCHC: 30.8 g/dL (ref 30.0–36.0)
MCV: 88.3 fL (ref 80.0–100.0)
Platelets: 196 10*3/uL (ref 150–400)
RBC: 3.68 MIL/uL — ABNORMAL LOW (ref 3.87–5.11)
RDW: 14.8 % (ref 11.5–15.5)
WBC: 4 10*3/uL (ref 4.0–10.5)
nRBC: 0 % (ref 0.0–0.2)

## 2018-06-09 LAB — PROTIME-INR
INR: 1.2
PROTHROMBIN TIME: 15.1 s (ref 11.4–15.2)

## 2018-06-09 LAB — HEPARIN LEVEL (UNFRACTIONATED): Heparin Unfractionated: 1.47 IU/mL — ABNORMAL HIGH (ref 0.30–0.70)

## 2018-06-09 MED ORDER — ACETAMINOPHEN 650 MG RE SUPP: 650.0000 mg | Freq: Four times a day (QID) | RECTAL | Status: DC | PRN

## 2018-06-09 MED ORDER — HEPARIN (PORCINE) IN NACL 100-0.45 UNIT/ML-% IJ SOLN
1500.0000 [IU]/h | INTRAMUSCULAR | Status: DC
  Administered 2018-06-09: 1500 [IU]/h via INTRAVENOUS
  Filled 2018-06-09 (×2): qty 250

## 2018-06-09 MED ORDER — ACETAMINOPHEN 325 MG PO TABS
650.0000 mg | ORAL_TABLET | Freq: Four times a day (QID) | ORAL | Status: DC | PRN
  Administered 2018-06-10 – 2018-06-11 (×2): 650 mg via ORAL
  Filled 2018-06-09 (×2): qty 2

## 2018-06-09 MED ORDER — RIVAROXABAN 15 MG PO TABS
15.0000 mg | ORAL_TABLET | Freq: Two times a day (BID) | ORAL | Status: DC
  Administered 2018-06-09 – 2018-06-12 (×6): 15 mg via ORAL
  Filled 2018-06-09 (×7): qty 1

## 2018-06-09 MED ORDER — RIVAROXABAN 20 MG PO TABS
20.0000 mg | ORAL_TABLET | Freq: Every day | ORAL | Status: DC
  Filled 2018-06-09: qty 1

## 2018-06-09 MED ORDER — OXYCODONE HCL 5 MG PO TABS
5.0000 mg | ORAL_TABLET | ORAL | Status: DC | PRN
  Administered 2018-06-10 – 2018-06-12 (×5): 5 mg via ORAL
  Filled 2018-06-09 (×6): qty 1

## 2018-06-09 MED ORDER — PANTOPRAZOLE SODIUM 40 MG PO TBEC
40.0000 mg | DELAYED_RELEASE_TABLET | Freq: Every day | ORAL | Status: DC
  Administered 2018-06-09 – 2018-06-12 (×4): 40 mg via ORAL
  Filled 2018-06-09 (×4): qty 1

## 2018-06-09 MED ORDER — HEPARIN BOLUS VIA INFUSION
2000.0000 [IU] | Freq: Once | INTRAVENOUS | Status: AC
  Administered 2018-06-09: 2000 [IU] via INTRAVENOUS
  Filled 2018-06-09: qty 2000

## 2018-06-09 MED ORDER — ONDANSETRON HCL 4 MG PO TABS: 4.0000 mg | ORAL_TABLET | Freq: Four times a day (QID) | ORAL | Status: DC | PRN

## 2018-06-09 MED ORDER — METOPROLOL SUCCINATE ER 25 MG PO TB24
25.0000 mg | ORAL_TABLET | Freq: Every day | ORAL | Status: DC
  Administered 2018-06-09 – 2018-06-10 (×2): 25 mg via ORAL
  Filled 2018-06-09 (×2): qty 1

## 2018-06-09 MED ORDER — HEPARIN BOLUS VIA INFUSION
4000.0000 [IU] | Freq: Once | INTRAVENOUS | Status: DC
  Filled 2018-06-09: qty 4000

## 2018-06-09 MED ORDER — ONDANSETRON HCL 4 MG/2ML IJ SOLN: 4.0000 mg | Freq: Four times a day (QID) | INTRAMUSCULAR | Status: DC | PRN

## 2018-06-09 MED ORDER — HYDRALAZINE HCL 20 MG/ML IJ SOLN: 10.0000 mg | INTRAMUSCULAR | Status: DC | PRN

## 2018-06-09 NOTE — Progress Notes (Signed)
Dr. Verdell Carmine has notified about about the patient being oriented x2  And hallucinating seeing a baby and children outside of her window. Recommended to monitor the patient at this time and to to provide sedatives or strong narcotics at this time.

## 2018-06-09 NOTE — H&P (Signed)
[facility] [place] NAME: Nicole Bishop    MR#:  [id number]  DATE OF BIRTH:  01/18/1931  DATE OF ADMISSION:  06/08/2018  PRIMARY CARE PHYSICIAN: Glendon Axe, MD   REQUESTING/REFERRING PHYSICIAN: Arvella Nigh, [place]  CHIEF COMPLAINT:   Chief Complaint  Patient presents with  . Fall    HISTORY OF PRESENT ILLNESS:  Nicole Bishop  is a 84 y.o. female who presents with chief complaint as above.  Patient had a fall at home tonight and presented to the ED for evaluation.  She has had some pain in her right lower extremity and some swelling in that leg.  Ultrasound shows occlusive thrombus in her right lower extremity.  Patient has been on Eliquis for A. fib, but has likely been missing doses.  She states that she did not tolerate Eliquis well as it made her feel bad, and she stopped taking it for about a month.  She was then restarted on it by her physician, but her daughter was at bedside feels like she may very well be missing doses.  Hospitalist were called for admission  PAST MEDICAL HISTORY:   Past Medical History:  Diagnosis Date  . Anemia   . Anxiety   . Arthritis   . Chronic kidney disease   . Coronary artery disease   . Depression   . Dizziness   . Dyspnea   . Dysrhythmia    gets tachy if anxious or excited  . GERD (gastroesophageal reflux disease)   . History of hiatal hernia   . HOH (hard of hearing)   . Hypertension   . Hypothyroidism   . IDA (iron deficiency anemia) 12/14/2014  . Myocardial infarction ([place]) 1992  . Renal insufficiency   . Sleep apnea   . Tremors of nervous system      PAST SURGICAL HISTORY:   Past Surgical History:  Procedure Laterality Date  . CATARACT EXTRACTION W/PHACO Left 07/15/2017   Procedure: CATARACT EXTRACTION PHACO AND INTRAOCULAR LENS PLACEMENT (IOC)-LEFT;  Surgeon: Birder Robson, MD;  Location: [facility] ORS;  Service: Ophthalmology;  Laterality: Left;  [place] 00.36.1 AP% 13.9 CDE  5.02 Fluid Pack lot # [id number]  . CATARACT EXTRACTION W/PHACO Right 08/26/2017   Procedure: CATARACT EXTRACTION PHACO AND INTRAOCULAR LENS PLACEMENT (IOC);  Surgeon: Birder Robson, MD;  Location: [facility] ORS;  Service: Ophthalmology;  Laterality: Right;  [place] 00:41.0 AP% 15.3 CDE 6.27 FLUID PACK LOT # [id number]  . CHOLECYSTECTOMY    . COLONOSCOPY WITH PROPOFOL N/A 04/10/2015   Procedure: COLONOSCOPY WITH PROPOFOL;  Surgeon: Manya Silvas, MD;  Location: [facility] ENDOSCOPY;  Service: Endoscopy;  Laterality: N/A;  . ESOPHAGOGASTRODUODENOSCOPY (EGD) WITH PROPOFOL N/A 04/10/2015   Procedure: ESOPHAGOGASTRODUODENOSCOPY (EGD) WITH PROPOFOL;  Surgeon: Manya Silvas, MD;  Location: [facility] ENDOSCOPY;  Service: Endoscopy;  Laterality: N/A;  . fatty tumor removed on left shoulder    . gallbladdedr removed    . JOINT REPLACEMENT Bilateral    total knee replacements  . REPLACEMENT TOTAL KNEE BILATERAL Bilateral      SOCIAL HISTORY:   Social History   Tobacco Use  . Smoking status: Never Smoker  . Smokeless tobacco: Never Used  Substance Use Topics  . Alcohol use: No     FAMILY HISTORY:   Family History  Problem Relation Age of Onset  . Arthritis Mother   . Hypertension Mother   . Stroke Mother   . Hypertension Father   . Heart attack Father   .  Breast cancer Neg Hx      DRUG ALLERGIES:   Allergies  Allergen Reactions  . Penicillins Rash and Other (See Comments)    Has patient had a PCN reaction causing immediate rash, facial/tongue/throat swelling, SOB or lightheadedness with hypotension: Unknown Has patient had a PCN reaction causing severe rash involving mucus membranes or skin necrosis: Unknown Has patient had a PCN reaction that required hospitalization: Unknown Has patient had a PCN reaction occurring within the last 10 years: Unknown If all of the above answers are "NO", then may proceed with Cephalosporin use.   . Nsaids Other (See Comments)    GI upset  . Atorvastatin  Rash  . Celebrex [Celecoxib] Other (See Comments)    Constipation and stomach upset  . Felodipine Nausea Only  . Oxaprozin Nausea Only  . Rofecoxib Other (See Comments)    GI upset  . Simvastatin Rash    MEDICATIONS AT HOME:   Prior to Admission medications   Medication Sig Start Date End Date Taking? Authorizing Provider  apixaban (ELIQUIS) 5 MG TABS tablet Take 1 tablet (5 mg total) by mouth 2 (two) times daily. 10/16/17   Merlyn Lot, MD  calcitRIOL (ROCALTROL) 0.25 MCG capsule Take 1 capsule by mouth daily. 09/25/17   [provider]  Cholecalciferol (VITAMIN D3) 2000 units capsule Take 4,000 Units by mouth daily.    [provider]  cyanocobalamin (,VITAMIN B-12,) 1000 MCG/ML injection Inject into the muscle.  12/27/13   [provider]  docusate sodium (COLACE) 100 MG capsule Take 100 mg by mouth daily.  06/03/14   [provider]  esomeprazole (NEXIUM) 40 MG capsule Take 40 mg by mouth daily as needed (acid reflux).     [provider]  Ferrous Sulfate (SLOW FE) 142 (45 Fe) MG TBCR Take 1 tablet by mouth daily.    [provider]  lactulose (CONSTULOSE) 10 GM/15ML solution  04/01/16   [provider]  loratadine (CLARITIN) 10 MG tablet Take 1 tablet (10 mg total) by mouth daily. 02/12/13   Jackolyn Confer, MD  metoprolol succinate (TOPROL-XL) 25 MG 24 hr tablet Take by mouth. 10/29/17 10/29/18  [provider]  mometasone (NASONEX) 50 MCG/ACT nasal spray Place 2 sprays into the nose daily.      [provider]  olopatadine (PATANOL) 0.1 % ophthalmic solution Place 1 drop into both eyes 2 (two) times daily. At 6-8 hours intervals     [provider]  polyethylene glycol (MIRALAX / GLYCOLAX) packet Take 17 g by mouth daily as needed for mild constipation or moderate constipation.    [provider]  traMADol-acetaminophen (ULTRACET) 37.5-325 MG tablet Take 1 tablet by mouth daily as  needed for moderate pain.  05/01/15   [provider]  vitamin B-12 (CYANOCOBALAMIN) 1000 MCG tablet Take 2,000 mcg by mouth daily.    [provider]    REVIEW OF SYSTEMS:  Review of Systems  Constitutional: Negative for chills, fever, malaise/fatigue and weight loss.  HENT: Negative for ear pain, hearing loss and tinnitus.   Eyes: Negative for blurred vision, double vision, pain and redness.  Respiratory: Negative for cough, hemoptysis and shortness of breath.   Cardiovascular: Positive for leg swelling. Negative for chest pain, palpitations and orthopnea.  Gastrointestinal: Negative for abdominal pain, constipation, diarrhea, nausea and vomiting.  Genitourinary: Negative for dysuria, frequency and hematuria.  Musculoskeletal: Negative for back pain, joint pain and neck pain.  Skin:  No acne, rash, or lesions  Neurological: Negative for dizziness, tremors, focal weakness and weakness.  Endo/Heme/Allergies: Negative for polydipsia. Does not bruise/bleed easily.  Psychiatric/Behavioral: Negative for depression. The patient is not nervous/anxious and does not have insomnia.      VITAL SIGNS:   Vitals:   06/08/18 1828 06/08/18 1830  BP:  (!) 175/88  Pulse:  66  Resp:  20  Temp:  98.4 F (36.9 C)  TempSrc:  Oral  SpO2:  96%  Weight: (!) 147.9 kg   Height: 6\' 1"  (1.854 m)    Wt Readings from Last 3 Encounters:  06/08/18 (!) 147.9 kg  05/12/18 120.7 kg  04/30/18 117.9 kg    PHYSICAL EXAMINATION:  Physical Exam  Vitals reviewed. Constitutional: She is oriented to person, place, and time. She appears well-developed and well-nourished. No distress.  HENT:  Head: Normocephalic and atraumatic.  Mouth/Throat: Oropharynx is clear and moist.  Eyes: Pupils are equal, round, and reactive to light. Conjunctivae and EOM are normal. No scleral icterus.  Neck: Normal range of motion. Neck supple. No JVD present. No thyromegaly present.  Cardiovascular: Normal  rate, regular rhythm and intact distal pulses. Exam reveals no gallop and no friction rub.  No murmur heard. Respiratory: Effort normal and breath sounds normal. No respiratory distress. She has no wheezes. She has no rales.  GI: Soft. Bowel sounds are normal. She exhibits no distension. There is no tenderness.  Musculoskeletal: Normal range of motion. She exhibits edema.  No arthritis, no gout  Lymphadenopathy:    She has no cervical adenopathy.  Neurological: She is alert and oriented to person, place, and time. No cranial nerve deficit.  No dysarthria, no aphasia  Skin: Skin is warm and dry. No rash noted. No erythema.  Psychiatric: She has a normal mood and affect. Her behavior is normal. Judgment and thought content normal.    LABORATORY PANEL:   CBC Recent Labs  Lab 06/08/18 2041  WBC 4.6  HGB 10.6*  HCT 34.3*  PLT 213   ------------------------------------------------------------------------------------------------------------------  Chemistries  Recent Labs  Lab 06/08/18 2041  NA 140  K 4.2  CL 110  CO2 25  GLUCOSE 100*  BUN 23  CREATININE 1.09*  CALCIUM 9.4   ------------------------------------------------------------------------------------------------------------------  Cardiac Enzymes No results for input(s): TROPONINI in the last 168 hours. ------------------------------------------------------------------------------------------------------------------  RADIOLOGY:  Dg Ankle Complete Right  Result Date: 06/08/2018 CLINICAL DATA:  Ankle pain after fall EXAM: RIGHT ANKLE - COMPLETE 3+ VIEW COMPARISON:  None. FINDINGS: Diffuse soft tissue swelling. No fracture or malalignment. Small plantar calcaneal spur. Ankle mortise is symmetric. IMPRESSION: No acute osseous abnormality. Electronically Signed   By: Donavan Foil M.D.   On: 06/08/2018 21:37   US Venous Img Lower Unilateral Right  Result Date: 06/08/2018 CLINICAL DATA:  Right leg swelling for 1 day  EXAM: RIGHT LOWER EXTREMITY VENOUS DOPPLER ULTRASOUND TECHNIQUE: Gray-scale sonography with graded compression, as well as color Doppler and duplex ultrasound were performed to evaluate the lower extremity deep venous systems from the level of the common femoral vein and including the common femoral, femoral, profunda femoral, popliteal and calf veins including the posterior tibial, peroneal and gastrocnemius veins when visible. The superficial great saphenous vein was also interrogated. Spectral Doppler was utilized to evaluate flow at rest and with distal augmentation maneuvers in the common femoral, femoral and popliteal veins. COMPARISON:  11/30/2014 FINDINGS: Contralateral Common Femoral Vein: Respiratory phasicity is normal and symmetric with the symptomatic side. No evidence of thrombus. Normal  compressibility. Common Femoral Vein: No evidence of thrombus. Normal compressibility, respiratory phasicity and response to augmentation. Saphenofemoral Junction: No evidence of thrombus. Normal compressibility and flow on color Doppler imaging. Profunda Femoral Vein: No evidence of thrombus. Normal compressibility and flow on color Doppler imaging. Femoral Vein: There is thrombus noted within the right femoral vein, occlusive in the proximal and mid femoral vein and nonocclusive in the distal femoral vein. Popliteal Vein: Nonocclusive thrombus noted in the popliteal vein. Calf Veins: Limited visualization, grossly unremarkable. Superficial Great Saphenous Vein: No evidence of thrombus. Normal compressibility. Venous Reflux:  None. Other Findings:  None. IMPRESSION: Occlusive thrombus noted within the proximal and mid right femoral vein with nonocclusive thrombus in the distal femoral vein and right popliteal vein. Electronically Signed   By: Rolm Baptise M.D.   On: 06/08/2018 23:48   Dg Knee Complete 4 Views Left  Result Date: 06/08/2018 CLINICAL DATA:  Fall, bilateral knee pain EXAM: LEFT KNEE - COMPLETE 4+  VIEW COMPARISON:  12/17/2008 FINDINGS: Changes of left knee replacement. No hardware complicating feature. No fracture, subluxation or dislocation. IMPRESSION: Prior left knee replacement. No complicating feature or acute bony abnormality. Electronically Signed   By: Rolm Baptise M.D.   On: 06/08/2018 20:14   Dg Knee Complete 4 Views Right  Result Date: 06/08/2018 CLINICAL DATA:  Fall, bilateral knee pain EXAM: RIGHT KNEE - COMPLETE 4+ VIEW COMPARISON:  12/17/2008 FINDINGS: Changes of right knee replacement. No hardware complicating feature. No acute bony abnormality. Specifically, no fracture, subluxation, or dislocation. No joint effusion. IMPRESSION: Right knee replacement. No complicating feature. No acute bony abnormality. Electronically Signed   By: Rolm Baptise M.D.   On: 06/08/2018 20:15    EKG:   Orders placed or performed during the hospital encounter of 04/30/18  . EKG 12-Lead  . EKG 12-Lead  . EKG 12-Lead  . EKG 12-Lead  . ED EKG  . ED EKG  . EKG    IMPRESSION AND PLAN:  Principal Problem:   DVT (deep venous thrombosis) (HCC) -IV heparin started.  Vascular surgery consult placed Active Problems:   Essential (primary) hypertension -continue home dose antihypertensives   Depression -continue home dose antidepressant   Chronic kidney disease (CKD), stage III (moderate) (HCC) -at baseline, avoid nephrotoxins and monitor   Acid reflux -to new home dose PPI  Chart review performed and case discussed with ED provider. Labs, imaging and/or ECG reviewed by provider and discussed with patient/family. Management plans discussed with the patient and/or family.  DVT PROPHYLAXIS: Systemic anticoagulation  GI PROPHYLAXIS:  PPI   ADMISSION STATUS: Observation  CODE STATUS: Full  TOTAL TIME TAKING CARE OF THIS PATIENT: 40 minutes.   Danne Scardina Liberal 06/09/2018, 12:29 AM  [facility]  Office  [phone]  CC: Primary care physician; Glendon Axe,  MD  Note:  This document was prepared using Dragon voice recognition software and may include unintentional dictation errors.

## 2018-06-09 NOTE — Progress Notes (Addendum)
ANTICOAGULATION CONSULT NOTE   Pharmacy Consult for Xarelto Indication: DVT  Vital Signs: Temp: 98.2 F (36.8 C) (10/22 0457) Temp Source: Oral (10/22 0146) BP: 189/91 (10/22 0911) Pulse Rate: 59 (10/22 0911)  Labs: Recent Labs    06/08/18 2041 06/09/18 0112 06/09/18 0616 06/09/18 0904  HGB 10.6*  --  10.0*  --   HCT 34.3*  --  32.5*  --   PLT 213  --  196  --   APTT  --  30  --  82*  LABPROT  --  15.1  --   --   INR  --  1.20  --   --   HEPARINUNFRC  --   --   --  1.47*  CREATININE 1.09*  --  0.94  --     Estimated Creatinine Clearance: 63.2 mL/min (by C-G formula based on SCr of 0.94 mg/dL).   Medical History: Past Medical History:  Diagnosis Date  . Anemia   . Anxiety   . Arthritis   . Chronic kidney disease   . Coronary artery disease   . Depression   . Dizziness   . Dyspnea   . Dysrhythmia    gets tachy if anxious or excited  . GERD (gastroesophageal reflux disease)   . History of hiatal hernia   . HOH (hard of hearing)   . Hypertension   . Hypothyroidism   . IDA (iron deficiency anemia) 12/14/2014  . Myocardial infarction ([place]) 1992  . Renal insufficiency   . Sleep apnea   . Tremors of nervous system     Medications:  Scheduled:  . metoprolol succinate  25 mg Oral Daily  . pantoprazole  40 mg Oral Daily  . Rivaroxaban  15 mg Oral BID WC  . [START ON 06/30/2018] rivaroxaban  20 mg Oral Q supper    Assessment: Patient presented to the ED earlier s/t fall and now is admitted for right leg swelling x 1 day. US shows DVT. Patient is anticoagulated PTA w/ eliquis 5 mg bid for afib, last dose 10/21 am. She was notably non-compliant with Eliquis due to side-effects. Baseline aPTT 30s, INR 1.20. She was started on a heparin drip initially, then transitioned to Xarelto on 10/22.  Goal of Therapy:  Goal aPTT  Monitor platelets by anticoagulation protocol: Yes   Plan:  rivoroxaban 15mg  orally twice daily with food for 21 days, then 20 mg once daily  with food Will monitor CBC's every 3 days  Vallery Sa, PharmD Clinical Pharmacist 06/09/2018

## 2018-06-09 NOTE — ED Notes (Signed)
Pt family made this EDT aware that the pt has some hx of dementia and sometimes "see's a man named John who scares her, but they are not real". Rn made aware

## 2018-06-09 NOTE — ED Notes (Signed)
Report was given  

## 2018-06-09 NOTE — Plan of Care (Signed)
The patient continues to be disoriented x2. The family indicates the patient hallucinates at home as well. Xarelto started. Heparin dripped stopped.  Problem: Education: Goal: Knowledge of General Education information will improve Description Including pain rating scale, medication(s)/side effects and non-pharmacologic comfort measures Outcome: Progressing   Problem: Health Behavior/Discharge Planning: Goal: Ability to manage health-related needs will improve Outcome: Progressing   Problem: Clinical Measurements: Goal: Ability to maintain clinical measurements within normal limits will improve Outcome: Progressing Goal: Will remain free from infection Outcome: Progressing Goal: Diagnostic test results will improve Outcome: Progressing Goal: Respiratory complications will improve Outcome: Progressing Goal: Cardiovascular complication will be avoided Outcome: Progressing   Problem: Activity: Goal: Risk for activity intolerance will decrease Outcome: Progressing   Problem: Nutrition: Goal: Adequate nutrition will be maintained Outcome: Progressing   Problem: Coping: Goal: Level of anxiety will decrease Outcome: Progressing   Problem: Elimination: Goal: Will not experience complications related to bowel motility Outcome: Progressing Goal: Will not experience complications related to urinary retention Outcome: Progressing   Problem: Pain Managment: Goal: General experience of comfort will improve Outcome: Progressing   Problem: Safety: Goal: Ability to remain free from injury will improve Outcome: Progressing   Problem: Skin Integrity: Goal: Risk for impaired skin integrity will decrease Outcome: Progressing

## 2018-06-09 NOTE — Consult Note (Signed)
[facility] Consult Note  MRN : [id number]  Nicole Bishop is a 84 y.o. (1930-11-06) female who presents with chief complaint of  Chief Complaint  Patient presents with  . Fall  .  History of Present Illness:  I am asked to evaluate the patient by Dr. Jannifer Franklin.  The patient is an 84 year old woman who presented to [facility] yesterday after sustaining a fall.  In the emergency room she was complaining of right leg pain and examination noted her right leg was more swollen than her left.  This prompted a duplex ultrasound which demonstrated DVT.  Interestingly the patient is supposed to be taking Eliquis for her atrial fibrillation but appears to miss doses frequently.  She states that it made her feel bad.  At the time of my interview she states her right leg is a little painful.  She denies shortness of breath or pleuritic type chest pains.  Duplex ultrasound is reviewed by me and does appear consistent with thrombus within the femoral vein on the right  Current Meds  Medication Sig  . amLODipine (NORVASC) 5 MG tablet Take 5 mg by mouth daily.  Marland Kitchen apixaban (ELIQUIS) 5 MG TABS tablet Take 1 tablet (5 mg total) by mouth 2 (two) times daily.  . benazepril (LOTENSIN) 5 MG tablet Take 5 mg by mouth daily.  . calcitRIOL (ROCALTROL) 0.25 MCG capsule Take 1 capsule by mouth daily.  . Cholecalciferol (VITAMIN D3) 2000 units capsule Take 4,000 Units by mouth daily.  . CYANOCOBALAMIN PO Take 1,000 mcg by mouth daily.  Marland Kitchen docusate sodium (COLACE) 100 MG capsule Take 100 mg by mouth 2 (two) times daily.   Marland Kitchen esomeprazole (NEXIUM) 40 MG capsule Take 40 mg by mouth daily as needed (acid reflux).   . Ferrous Sulfate (SLOW FE) 142 (45 Fe) MG TBCR Take 1 tablet by mouth daily.  Marland Kitchen lactulose (CONSTULOSE) 10 GM/15ML solution Take 10 g by mouth daily as needed for mild constipation.   Marland Kitchen loratadine (CLARITIN) 10 MG tablet Take 1 tablet (10 mg total) by  mouth daily.  . metoprolol succinate (TOPROL-XL) 25 MG 24 hr tablet Take 12.5 mg by mouth daily.   . mometasone (NASONEX) 50 MCG/ACT nasal spray Place 2 sprays into the nose daily as needed (allergies).   Marland Kitchen olopatadine (PATANOL) 0.1 % ophthalmic solution Place 1 drop into both eyes 2 (two) times daily as needed for allergies.   . polyethylene glycol (MIRALAX / GLYCOLAX) packet Take 17 g by mouth daily as needed for mild constipation or moderate constipation.  . traMADol-acetaminophen (ULTRACET) 37.5-325 MG tablet Take 1 tablet by mouth daily as needed for moderate pain.     Past Medical History:  Diagnosis Date  . Anemia   . Anxiety   . Arthritis   . Chronic kidney disease   . Coronary artery disease   . Depression   . Dizziness   . Dyspnea   . Dysrhythmia    gets tachy if anxious or excited  . GERD (gastroesophageal reflux disease)   . History of hiatal hernia   . HOH (hard of hearing)   . Hypertension   . Hypothyroidism   . IDA (iron deficiency anemia) 12/14/2014  . Myocardial infarction ([place]) 1992  . Renal insufficiency   . Sleep apnea   . Tremors of nervous system     Past Surgical History:  Procedure Laterality Date  . CATARACT EXTRACTION W/PHACO Left 07/15/2017   Procedure: CATARACT EXTRACTION  PHACO AND INTRAOCULAR LENS PLACEMENT (IOC)-LEFT;  Surgeon: Birder Robson, MD;  Location: [facility] ORS;  Service: Ophthalmology;  Laterality: Left;  [place] 00.36.1 AP% 13.9 CDE 5.02 Fluid Pack lot # [id number]  . CATARACT EXTRACTION W/PHACO Right 08/26/2017   Procedure: CATARACT EXTRACTION PHACO AND INTRAOCULAR LENS PLACEMENT (IOC);  Surgeon: Birder Robson, MD;  Location: [facility] ORS;  Service: Ophthalmology;  Laterality: Right;  [place] 00:41.0 AP% 15.3 CDE 6.27 FLUID PACK LOT # [id number]  . CHOLECYSTECTOMY    . COLONOSCOPY WITH PROPOFOL N/A 04/10/2015   Procedure: COLONOSCOPY WITH PROPOFOL;  Surgeon: Manya Silvas, MD;  Location: [facility]COPY;  Service: Endoscopy;  Laterality: N/A;   . ESOPHAGOGASTRODUODENOSCOPY (EGD) WITH PROPOFOL N/A 04/10/2015   Procedure: ESOPHAGOGASTRODUODENOSCOPY (EGD) WITH PROPOFOL;  Surgeon: Manya Silvas, MD;  Location: [facility] ENDOSCOPY;  Service: Endoscopy;  Laterality: N/A;  . fatty tumor removed on left shoulder    . gallbladdedr removed    . JOINT REPLACEMENT Bilateral    total knee replacements  . REPLACEMENT TOTAL KNEE BILATERAL Bilateral     Social History Social History   Tobacco Use  . Smoking status: Never Smoker  . Smokeless tobacco: Never Used  Substance Use Topics  . Alcohol use: No  . Drug use: No    Family History Family History  Problem Relation Age of Onset  . Arthritis Mother   . Hypertension Mother   . Stroke Mother   . Hypertension Father   . Heart attack Father   . Breast cancer Neg Hx   No family history of bleeding/clotting disorders, porphyria or autoimmune disease   Allergies  Allergen Reactions  . Penicillins Rash and Other (See Comments)    Has patient had a PCN reaction causing immediate rash, facial/tongue/throat swelling, SOB or lightheadedness with hypotension: Unknown Has patient had a PCN reaction causing severe rash involving mucus membranes or skin necrosis: Unknown Has patient had a PCN reaction that required hospitalization: Unknown Has patient had a PCN reaction occurring within the last 10 years: Unknown If all of the above answers are "NO", then may proceed with Cephalosporin use.   . Nsaids Other (See Comments)    GI upset  . Atorvastatin Rash  . Celebrex [Celecoxib] Other (See Comments)    Constipation and stomach upset  . Felodipine Nausea Only  . Oxaprozin Nausea Only  . Rofecoxib Other (See Comments)    GI upset  . Simvastatin Rash     REVIEW OF SYSTEMS (Negative unless checked)  Constitutional: [] Weight loss  [] Fever  [] Chills Cardiac: [] Chest pain   [] Chest pressure   [] Palpitations   [] Shortness of breath when laying flat   [] Shortness of breath at rest    [x] Shortness of breath with exertion. Vascular:  [] Pain in legs with walking   [] Pain in legs at rest   [] Pain in legs when laying flat   [] Claudication   [] Pain in feet when walking  [] Pain in feet at rest  [] Pain in feet when laying flat   [x] History of DVT   [] Phlebitis   [x] Swelling in legs   [] Varicose veins   [] Non-healing ulcers Pulmonary:   [] Uses home oxygen   [] Productive cough   [] Hemoptysis   [] Wheeze  [] COPD   [] Asthma Neurologic:  [] Dizziness  [] Blackouts   [] Seizures   [] History of stroke   [] History of TIA  [] Aphasia   [] Temporary blindness   [] Dysphagia   [] Weakness or numbness in arms   [] Weakness or numbness in legs Musculoskeletal:  [] Arthritis   [] Joint  swelling   [] Joint pain   [] Low back pain Hematologic:  [] Easy bruising  [] Easy bleeding   [] Hypercoagulable state   [] Anemic  [] Hepatitis Gastrointestinal:  [] Blood in stool   [] Vomiting blood  [x] Gastroesophageal reflux/heartburn   [] Difficulty swallowing. Genitourinary:  [] Chronic kidney disease   [] Difficult urination  [] Frequent urination  [] Burning with urination   [] Blood in urine Skin:  [] Rashes   [] Ulcers   [] Wounds Psychological:  [] History of anxiety   []  History of major depression.  Physical Examination  Vitals:   06/09/18 0457 06/09/18 0911 06/09/18 1246 06/09/18 2135  BP: (!) 174/79 (!) 189/91 (!) 150/73 (!) 158/82  Pulse: 63 (!) 59 60 (!) 57  Resp: 20  20 15   Temp: 98.2 F (36.8 C)  99.3 F (37.4 C) 98.7 F (37.1 C)  TempSrc:   Oral Oral  SpO2: 98%  97% 94%  Weight:      Height:       Body mass index is 33.65 kg/m. Gen:  WD/WN, NAD Head: [place]/AT, No temporalis wasting. Prominent temp pulse not noted. Ear/Nose/Throat: Hearing grossly intact, nares w/o erythema or drainage, oropharynx w/o Erythema/Exudate Eyes: PERRLA, EOMI.  Neck: Supple, no nuchal rigidity.  No bruit or JVD.  Pulmonary:  Good air movement, clear to auscultation bilaterally.  Cardiac: RRR, normal S1, S2, no Murmurs, rubs or  gallops. Vascular: Right leg demonstrates 3+ soft pitting edema whereas the left leg is 1-2+.  Both feet are pink and warm with brisk capillary refill.  There are no open wounds or sores. Vessel Right Left  Radial Palpable Palpable  PT  trace palpable  trace palpable  DP  1+ palpable  2+ palpable   Gastrointestinal: soft, non-tender/non-distended. No guarding/reflex. No masses, surgical incisions, or scars. Musculoskeletal: M/S 5/5 throughout.  Extremities without ischemic changes.  No deformity or atrophy. No edema. Neurologic: CN 2-12 intact. Pain and light touch intact in extremities.  Symmetrical.  Speech is fluent. Motor exam as listed above. Psychiatric: Judgment intact, Mood & affect appropriate for pt's clinical situation. Dermatologic: No rashes or ulcers noted.  No cellulitis or open wounds. Lymph : No Cervical, Axillary, or Inguinal lymphadenopathy.    CBC Lab Results  Component Value Date   WBC 4.0 06/09/2018   HGB 10.0 (L) 06/09/2018   HCT 32.5 (L) 06/09/2018   MCV 88.3 06/09/2018   PLT 196 06/09/2018    BMET    Component Value Date/Time   NA 142 06/09/2018 0616   NA 135 11/21/2014 1301   K 3.7 06/09/2018 0616   K 4.1 11/21/2014 1301   CL 112 (H) 06/09/2018 0616   CL 105 11/21/2014 1301   CO2 24 06/09/2018 0616   CO2 23 11/21/2014 1301   GLUCOSE 94 06/09/2018 0616   GLUCOSE 102 (H) 11/21/2014 1301   BUN 18 06/09/2018 0616   BUN 15 11/21/2014 1301   CREATININE 0.94 06/09/2018 0616   CREATININE 1.08 (H) 11/21/2014 1301   CALCIUM 9.1 06/09/2018 0616   CALCIUM 9.0 11/21/2014 1301   GFRNONAA 54 (L) 06/09/2018 0616   GFRNONAA 48 (L) 11/21/2014 1301   GFRAA >60 06/09/2018 0616   GFRAA 56 (L) 11/21/2014 1301   Estimated Creatinine Clearance: 63.2 mL/min (by C-G formula based on SCr of 0.94 mg/dL).  COAG Lab Results  Component Value Date   INR 1.20 06/09/2018   INR 1.1 11/21/2014    Radiology Dg Ankle Complete Right  Result Date: 06/08/2018 CLINICAL  DATA:  Ankle pain after fall EXAM: RIGHT ANKLE -  COMPLETE 3+ VIEW COMPARISON:  None. FINDINGS: Diffuse soft tissue swelling. No fracture or malalignment. Small plantar calcaneal spur. Ankle mortise is symmetric. IMPRESSION: No acute osseous abnormality. Electronically Signed   By: Donavan Foil M.D.   On: 06/08/2018 21:37   US Venous Img Lower Unilateral Right  Result Date: 06/08/2018 CLINICAL DATA:  Right leg swelling for 1 day EXAM: RIGHT LOWER EXTREMITY VENOUS DOPPLER ULTRASOUND TECHNIQUE: Gray-scale sonography with graded compression, as well as color Doppler and duplex ultrasound were performed to evaluate the lower extremity deep venous systems from the level of the common femoral vein and including the common femoral, femoral, profunda femoral, popliteal and calf veins including the posterior tibial, peroneal and gastrocnemius veins when visible. The superficial great saphenous vein was also interrogated. Spectral Doppler was utilized to evaluate flow at rest and with distal augmentation maneuvers in the common femoral, femoral and popliteal veins. COMPARISON:  11/30/2014 FINDINGS: Contralateral Common Femoral Vein: Respiratory phasicity is normal and symmetric with the symptomatic side. No evidence of thrombus. Normal compressibility. Common Femoral Vein: No evidence of thrombus. Normal compressibility, respiratory phasicity and response to augmentation. Saphenofemoral Junction: No evidence of thrombus. Normal compressibility and flow on color Doppler imaging. Profunda Femoral Vein: No evidence of thrombus. Normal compressibility and flow on color Doppler imaging. Femoral Vein: There is thrombus noted within the right femoral vein, occlusive in the proximal and mid femoral vein and nonocclusive in the distal femoral vein. Popliteal Vein: Nonocclusive thrombus noted in the popliteal vein. Calf Veins: Limited visualization, grossly unremarkable. Superficial Great Saphenous Vein: No evidence of thrombus.  Normal compressibility. Venous Reflux:  None. Other Findings:  None. IMPRESSION: Occlusive thrombus noted within the proximal and mid right femoral vein with nonocclusive thrombus in the distal femoral vein and right popliteal vein. Electronically Signed   By: Rolm Baptise M.D.   On: 06/08/2018 23:48   Dg Knee Complete 4 Views Left  Result Date: 06/08/2018 CLINICAL DATA:  Fall, bilateral knee pain EXAM: LEFT KNEE - COMPLETE 4+ VIEW COMPARISON:  12/17/2008 FINDINGS: Changes of left knee replacement. No hardware complicating feature. No fracture, subluxation or dislocation. IMPRESSION: Prior left knee replacement. No complicating feature or acute bony abnormality. Electronically Signed   By: Rolm Baptise M.D.   On: 06/08/2018 20:14   Dg Knee Complete 4 Views Right  Result Date: 06/08/2018 CLINICAL DATA:  Fall, bilateral knee pain EXAM: RIGHT KNEE - COMPLETE 4+ VIEW COMPARISON:  12/17/2008 FINDINGS: Changes of right knee replacement. No hardware complicating feature. No acute bony abnormality. Specifically, no fracture, subluxation, or dislocation. No joint effusion. IMPRESSION: Right knee replacement. No complicating feature. No acute bony abnormality. Electronically Signed   By: Rolm Baptise M.D.   On: 06/08/2018 20:15     Assessment/Plan 1.  Acute right lower extremity DVT: Patient appears to be tolerating this well.  Given her age and comorbidities I do not recommend thrombolysis.  I would constrict continue with anticoagulation and I concur with Dr. Verdell Carmine perhaps Xarelto would be a better choice and would result in more compliance as a once a day medication.  The patient has been on anticoagulation now for approximately 36 hours and therefore graduated compression can be instituted at any point.  Conservative therapies with elevation are also recommended.  2.  Coronary artery disease: Continue cardiac and antihypertensive medications as already ordered and reviewed, no changes at this  time.  Continue statin as ordered and reviewed, no changes at this time  Nitrates PRN for chest pain  3.  Hypertension: Continue antihypertensive medications as already ordered, these medications have been reviewed and there are no changes at this time.  4.  Gastroesophageal reflux disease: Continue PPI as already ordered, this medication has been reviewed and there are no changes at this time.  Avoidence of caffeine and alcohol  Moderate elevation of the head of the bed     Hortencia Pilar, MD  06/09/2018 11:39 PM

## 2018-06-09 NOTE — Progress Notes (Addendum)
ANTICOAGULATION CONSULT NOTE - Initial Consult  Pharmacy Consult for heparin Indication: DVT  Allergies  Allergen Reactions  . Penicillins Rash and Other (See Comments)    Has patient had a PCN reaction causing immediate rash, facial/tongue/throat swelling, SOB or lightheadedness with hypotension: Unknown Has patient had a PCN reaction causing severe rash involving mucus membranes or skin necrosis: Unknown Has patient had a PCN reaction that required hospitalization: Unknown Has patient had a PCN reaction occurring within the last 10 years: Unknown If all of the above answers are "NO", then may proceed with Cephalosporin use.   . Nsaids Other (See Comments)    GI upset  . Atorvastatin Rash  . Celebrex [Celecoxib] Other (See Comments)    Constipation and stomach upset  . Felodipine Nausea Only  . Oxaprozin Nausea Only  . Rofecoxib Other (See Comments)    GI upset  . Simvastatin Rash    Patient Measurements: Height: 6\' 1"  (185.4 cm) Weight: (!) 326 lb (147.9 kg) IBW/kg (Calculated) : 75.4 Heparin Dosing Weight: 110 kg  Vital Signs: Temp: 98.4 F (36.9 C) (10/21 1830) Temp Source: Oral (10/21 1830) BP: 175/88 (10/21 1830) Pulse Rate: 66 (10/21 1830)  Labs: Recent Labs    06/08/18 2041  HGB 10.6*  HCT 34.3*  PLT 213  CREATININE 1.09*    Estimated Creatinine Clearance: 62.2 mL/min (A) (by C-G formula based on SCr of 1.09 mg/dL (H)).   Medical History: Past Medical History:  Diagnosis Date  . Anemia   . Anxiety   . Arthritis   . Chronic kidney disease   . Coronary artery disease   . Depression   . Dizziness   . Dyspnea   . Dysrhythmia    gets tachy if anxious or excited  . GERD (gastroesophageal reflux disease)   . History of hiatal hernia   . HOH (hard of hearing)   . Hypertension   . Hypothyroidism   . IDA (iron deficiency anemia) 12/14/2014  . Myocardial infarction ([place]) 1992  . Renal insufficiency   . Sleep apnea   . Tremors of nervous system      Medications:  Scheduled:  . heparin  2,000 Units Intravenous Once    Assessment: Patient presented to the ED earlier s/t fall and now is admitted for right leg swelling x 1 day. US shows DVT. Patient is anticoagulated PTA w/ eliquis 5 mg bid for afib, last dose 10/21 am. Patient is now being started on a heparin drip for DVT  Goal of Therapy:  Heparin level 0.3-0.7 units/ml Monitor platelets by anticoagulation protocol: Yes   Plan:  Will half-bolus w/ heparin 2000 units IV x 1 since patient's last eliquis dose was in the morning. Will start drip rate at 1500 units/hr Will check anti-Xa level at 0900. Baseline labs drawn. Will monitor daily CBC's and adjust per anti-Xa levels.  Tobie Lords, PharmD, BCPS Clinical Pharmacist 06/09/2018

## 2018-06-09 NOTE — Progress Notes (Addendum)
[facility] NAME: Nicole Bishop    MR#:  [id number]  DATE OF BIRTH:  1930-11-05  SUBJECTIVE:   Patient here after a fall and also worsening right lower extremity edema noted to have an extensive right lower extremity DVT.  REVIEW OF SYSTEMS:    Review of Systems  Constitutional: Negative for chills and fever.  HENT: Negative for congestion and tinnitus.   Eyes: Negative for blurred vision and double vision.  Respiratory: Negative for cough, shortness of breath and wheezing.   Cardiovascular: Positive for leg swelling (RLE). Negative for chest pain, orthopnea and PND.  Gastrointestinal: Negative for abdominal pain, diarrhea, nausea and vomiting.  Genitourinary: Negative for dysuria and hematuria.  Musculoskeletal: Positive for falls.  Neurological: Negative for dizziness, sensory change and focal weakness.  All other systems reviewed and are negative.   Nutrition: Heart Healthy Tolerating Diet: Yes Tolerating PT: Await Eval.      DRUG ALLERGIES:   Allergies  Allergen Reactions  . Penicillins Rash and Other (See Comments)    Has patient had a PCN reaction causing immediate rash, facial/tongue/throat swelling, SOB or lightheadedness with hypotension: Unknown Has patient had a PCN reaction causing severe rash involving mucus membranes or skin necrosis: Unknown Has patient had a PCN reaction that required hospitalization: Unknown Has patient had a PCN reaction occurring within the last 10 years: Unknown If all of the above answers are "NO", then may proceed with Cephalosporin use.   . Nsaids Other (See Comments)    GI upset  . Atorvastatin Rash  . Celebrex [Celecoxib] Other (See Comments)    Constipation and stomach upset  . Felodipine Nausea Only  . Oxaprozin Nausea Only  . Rofecoxib Other (See Comments)    GI upset  . Simvastatin Rash    VITALS:  Blood pressure (!) 150/73, pulse 60, temperature 99.3 F (37.4  C), temperature source Oral, resp. rate 20, height 6\' 1"  (1.854 m), weight 115.7 kg, SpO2 97 %.  PHYSICAL EXAMINATION:   Physical Exam  GENERAL:  84 y.o.-year-old patient lying in bed confused but in NAD.   EYES: Pupils equal, round, reactive to light and accommodation. No scleral icterus. Extraocular muscles intact.  HEENT: Head atraumatic, normocephalic. Oropharynx and nasopharynx clear.  NECK:  Supple, no jugular venous distention. No thyroid enlargement, no tenderness.  LUNGS: Normal breath sounds bilaterally, no wheezing, rales, rhonchi. No use of accessory muscles of respiration.  CARDIOVASCULAR: S1, S2 normal. No murmurs, rubs, or gallops.  ABDOMEN: Soft, nontender, nondistended. Bowel sounds present. No organomegaly or mass.  EXTREMITIES: No cyanosis, clubbing, RLE edema, warmth  NEUROLOGIC: Cranial nerves II through XII are intact. No focal Motor or sensory deficits b/l.   PSYCHIATRIC: The patient is alert and oriented x 1.  SKIN: No obvious rash, lesion, or ulcer.    LABORATORY PANEL:   CBC Recent Labs  Lab 06/09/18 0616  WBC 4.0  HGB 10.0*  HCT 32.5*  PLT 196   ------------------------------------------------------------------------------------------------------------------  Chemistries  Recent Labs  Lab 06/09/18 0616  NA 142  K 3.7  CL 112*  CO2 24  GLUCOSE 94  BUN 18  CREATININE 0.94  CALCIUM 9.1   ------------------------------------------------------------------------------------------------------------------  Cardiac Enzymes No results for input(s): TROPONINI in the last 168 hours. ------------------------------------------------------------------------------------------------------------------  RADIOLOGY:  Dg Ankle Complete Right  Result Date: 06/08/2018 CLINICAL DATA:  Ankle pain after fall EXAM: RIGHT ANKLE - COMPLETE 3+ VIEW COMPARISON:  None. FINDINGS: Diffuse soft tissue  swelling. No fracture or malalignment. Small plantar calcaneal spur.  Ankle mortise is symmetric. IMPRESSION: No acute osseous abnormality. Electronically Signed   By: Donavan Foil M.D.   On: 06/08/2018 21:37   US Venous Img Lower Unilateral Right  Result Date: 06/08/2018 CLINICAL DATA:  Right leg swelling for 1 day EXAM: RIGHT LOWER EXTREMITY VENOUS DOPPLER ULTRASOUND TECHNIQUE: Gray-scale sonography with graded compression, as well as color Doppler and duplex ultrasound were performed to evaluate the lower extremity deep venous systems from the level of the common femoral vein and including the common femoral, femoral, profunda femoral, popliteal and calf veins including the posterior tibial, peroneal and gastrocnemius veins when visible. The superficial great saphenous vein was also interrogated. Spectral Doppler was utilized to evaluate flow at rest and with distal augmentation maneuvers in the common femoral, femoral and popliteal veins. COMPARISON:  11/30/2014 FINDINGS: Contralateral Common Femoral Vein: Respiratory phasicity is normal and symmetric with the symptomatic side. No evidence of thrombus. Normal compressibility. Common Femoral Vein: No evidence of thrombus. Normal compressibility, respiratory phasicity and response to augmentation. Saphenofemoral Junction: No evidence of thrombus. Normal compressibility and flow on color Doppler imaging. Profunda Femoral Vein: No evidence of thrombus. Normal compressibility and flow on color Doppler imaging. Femoral Vein: There is thrombus noted within the right femoral vein, occlusive in the proximal and mid femoral vein and nonocclusive in the distal femoral vein. Popliteal Vein: Nonocclusive thrombus noted in the popliteal vein. Calf Veins: Limited visualization, grossly unremarkable. Superficial Great Saphenous Vein: No evidence of thrombus. Normal compressibility. Venous Reflux:  None. Other Findings:  None. IMPRESSION: Occlusive thrombus noted within the proximal and mid right femoral vein with nonocclusive thrombus in  the distal femoral vein and right popliteal vein. Electronically Signed   By: Rolm Baptise M.D.   On: 06/08/2018 23:48   Dg Knee Complete 4 Views Left  Result Date: 06/08/2018 CLINICAL DATA:  Fall, bilateral knee pain EXAM: LEFT KNEE - COMPLETE 4+ VIEW COMPARISON:  12/17/2008 FINDINGS: Changes of left knee replacement. No hardware complicating feature. No fracture, subluxation or dislocation. IMPRESSION: Prior left knee replacement. No complicating feature or acute bony abnormality. Electronically Signed   By: Rolm Baptise M.D.   On: 06/08/2018 20:14   Dg Knee Complete 4 Views Right  Result Date: 06/08/2018 CLINICAL DATA:  Fall, bilateral knee pain EXAM: RIGHT KNEE - COMPLETE 4+ VIEW COMPARISON:  12/17/2008 FINDINGS: Changes of right knee replacement. No hardware complicating feature. No acute bony abnormality. Specifically, no fracture, subluxation, or dislocation. No joint effusion. IMPRESSION: Right knee replacement. No complicating feature. No acute bony abnormality. Electronically Signed   By: Rolm Baptise M.D.   On: 06/08/2018 20:15     ASSESSMENT AND PLAN:   84 year old female with past medical history of hypertension, hypothyroidism, GERD, coronary artery disease, chronic kidney disease stage III, atrial fibrillation who presents to the hospital due to a fall and noted to have significant right lower extremity swelling consistent with a DVT  1.  Right lower extremity swelling redness-this is secondary to an extensive right lower extremity DVT.  Patient apparently is on Eliquis but was not compliant with it and now presents with a DVT. - Currently on a heparin drip but will switch to Xarelto today. - Discussed with vascular surgery and no plans for TPA or thrombectomy presently.  Continue anticoagulation.  2.  Essential hypertension-continue Toprol, IV hydralazine as needed.  3.  History of atrial fibrillation- rate controlled.  Continue Toprol, now on Xarelto.  4.  Altered mental  status/hallucinations-secondary to sundowning and possible underlying dementia. - Avoid benzodiazepines.  5. GERD - cont. Protonix.    Await PT eval.     All the records are reviewed and case discussed with Care Management/Social Worker. Management plans discussed with the patient, family and they are in agreement.  CODE STATUS: Full code  DVT Prophylaxis: Xarelto.    TOTAL TIME TAKING CARE OF THIS PATIENT: 30 minutes.   POSSIBLE D/C IN 1-2 DAYS, DEPENDING ON CLINICAL CONDITION.   Henreitta Leber M.D on 06/09/2018 at 12:56 PM  Between 7am to 6pm - Pager - [phone]  After 6pm go to [email or web address] - Proofreader  [facility]  Office  [phone]  CC: Primary care physician; Glendon Axe, MD

## 2018-06-09 NOTE — Progress Notes (Signed)
The patient has hallucinations as she indicated she saw a baby running outside of her window.

## 2018-06-10 ENCOUNTER — Observation Stay: Payer: Medicare HMO

## 2018-06-10 DIAGNOSIS — N183 Chronic kidney disease, stage 3 (moderate): Secondary | ICD-10-CM | POA: Diagnosis not present

## 2018-06-10 DIAGNOSIS — S4991XA Unspecified injury of right shoulder and upper arm, initial encounter: Secondary | ICD-10-CM | POA: Diagnosis not present

## 2018-06-10 DIAGNOSIS — F329 Major depressive disorder, single episode, unspecified: Secondary | ICD-10-CM | POA: Diagnosis not present

## 2018-06-10 DIAGNOSIS — I82409 Acute embolism and thrombosis of unspecified deep veins of unspecified lower extremity: Secondary | ICD-10-CM | POA: Diagnosis not present

## 2018-06-10 DIAGNOSIS — I10 Essential (primary) hypertension: Secondary | ICD-10-CM | POA: Diagnosis not present

## 2018-06-10 DIAGNOSIS — I82411 Acute embolism and thrombosis of right femoral vein: Secondary | ICD-10-CM | POA: Diagnosis not present

## 2018-06-10 DIAGNOSIS — M25511 Pain in right shoulder: Secondary | ICD-10-CM | POA: Diagnosis not present

## 2018-06-10 NOTE — Progress Notes (Addendum)
Physical Therapy Evaluation Patient Details Name: Nicole Bishop MRN: [id number] DOB: 29-Nov-1930 Today's Date: 06/10/2018   History of Present Illness  Nicole Bishop is a 84 y.o. female who presents with chief complaint as above.  Patient had a fall at home tonight and presented to the ED for evaluation.  She has had some pain in her right lower extremity and some swelling in that leg.  Ultrasound shows occlusive thrombus in her right lower extremity. Patient has been on Eliquis for A. fib, but has likely been missing doses.  She states that she did not tolerate Eliquis well as it made her feel bad, and she stopped taking it for about a month.  She was then restarted on it by her physician, but her daughter was at bedside feels like she may very well be missing doses.  Clinical Impression  Pt admitted with above diagnosis. Pt currently with functional limitations due to the deficits listed below (see PT Problem List). Pt moves very slowly and requires +1 assist to get up to EOB and +2 assist to lay back down from sitting. She requires +2 assist to move up toward [facility] after laying back down. Pt attempts to stand multiple times with therapist however even with max assistance is unable to clear the bed. Pt is unable to stand or transfer. Unable/unsafe to transfer at this time. Pt is unable to actively lift RUE secondary to weakness and pain. She allows for PROM flexion and scaption to around 90 degrees but is increasingly painful through ROM. Mildly painful to palpation along anterior and lateral R shoulder. Pt with history of chronic bilateral shoulder pain which has been treated with cortisone injections in the past. R shoulder is currently more painful than baseline. Pt suffered a [place] injury prior to arrival. RN notified of R shoulder pain. Recommend SNF placement at discharge. Pt currently unsafe to return home and will need PT to return to prior level of function. Pt will benefit from PT services to  address deficits in strength, balance, and mobility in order to return to full function at home.     Follow Up Recommendations SNF    Equipment Recommendations  None recommended by PT    Recommendations for Other Services       Precautions / Restrictions Precautions Precautions: Fall Restrictions Weight Bearing Restrictions: No      Mobility  Bed Mobility Overal bed mobility: Needs Assistance Bed Mobility: Supine to Sit;Sit to Supine     Supine to sit: Mod assist Sit to supine: Mod assist;+2 for physical assistance   General bed mobility comments: Pt moves very slowly and requires +1 assist to get up to EOB and +2 assist to lay back down from sitting. She requires +2 assist to move up toward [facility].   Transfers Overall transfer level: Needs assistance Equipment used: Rolling walker (2 wheeled) Transfers: Sit to/from Stand Sit to Stand: Total assist         General transfer comment: Pt attempts to stand multiple times with therapist however even with max assistance is unable to clear the bed. Pt is unable to stand or transfer. Unable/unsafe to transfer at this time  Ambulation/Gait                Stairs            Wheelchair Mobility    Modified Rankin (Stroke Patients Only)       Balance Overall balance assessment: Needs assistance Sitting-balance support: No upper extremity supported Sitting  balance-Leahy Scale: Good                                       Pertinent Vitals/Pain Pain Assessment: 0-10 Pain Score: 8  Pain Location: Bilateral shoulder pain, worse on R than L with movement Pain Intervention(s): Monitored during session;Patient requesting pain meds-RN notified    Home Living Family/patient expects to be discharged to:: Private residence Living Arrangements: Alone Available Help at Discharge: Family Type of Home: Apartment Home Access: [place]: One level Home Equipment: Environmental consultant - 4  wheels;Bedside commode;Shower seat;Electric scooter;Other (comment)(Lift chair)      Prior Function Level of Independence: Needs assistance   Gait / Transfers Assistance Needed: Independent with ambulation with rollator. One fall in the last 12 months  ADL's / Homemaking Assistance Needed: Independent with ADLs but requires assist with meals and homemaking from daughter        Hand Dominance   Dominant Hand: Right    Extremity/Trunk Assessment   Upper Extremity Assessment Upper Extremity Assessment: RUE deficits/detail RUE Deficits / Details: Pt is unable to actively lift RUE secondary to weakness and pain. She allow for PROM flexion and scaption to around 90 degrees but is increasingly painful through ROM. Mildly painful to palpation along anterior and lateral R shoulder. Pt with history of chronic bilateral shoulder pain which has been treated with cortisone injections in the past. R shoulder is currently more painful than baseline. Pt suffered a [place] injury prior to arrival    Lower Extremity Assessment Lower Extremity Assessment: Generalized weakness       Communication   Communication: HOH  Cognition Arousal/Alertness: Awake/alert Behavior During Therapy: Flat affect Overall Cognitive Status: Within Functional Limits for tasks assessed                                 General Comments: AOx4      General Comments      Exercises     Assessment/Plan    PT Assessment Patient needs continued PT services  PT Problem List Decreased strength;Decreased activity tolerance;Decreased balance;Decreased mobility;Pain       PT Treatment Interventions DME instruction;Gait training;Stair training;Functional mobility training;Therapeutic activities;Therapeutic exercise;Balance training;Neuromuscular re-education;Patient/family education    PT Goals (Current goals can be found in the Care Plan section)  Acute Rehab PT Goals Patient Stated Goal: Return to prior  function PT Goal Formulation: With patient Time For Goal Achievement: 06/24/18 Potential to Achieve Goals: Good    Frequency Min 2X/week   Barriers to discharge Decreased caregiver support Pt lives alone    Co-evaluation               AM-PAC PT "6 Clicks" Daily Activity  Outcome Measure Difficulty turning over in bed (including adjusting bedclothes, sheets and blankets)?: Unable Difficulty moving from lying on back to sitting on the side of the bed? : Unable Difficulty sitting down on and standing up from a chair with arms (e.g., wheelchair, bedside commode, etc,.)?: Unable Help needed moving to and from a bed to chair (including a wheelchair)?: Total Help needed walking in hospital room?: Total Help needed climbing 3-5 steps with a railing? : Total 6 Click Score: 6    End of Session Equipment Utilized During Treatment: Gait belt Activity Tolerance: Patient tolerated treatment well Patient left: in bed;with bed alarm set;with  call bell/phone within reach;with family/visitor present Nurse Communication: Mobility status PT Visit Diagnosis: Unsteadiness on feet (R26.81);Muscle weakness (generalized) (M62.81);History of falling (Z91.81);Difficulty in walking, not elsewhere classified (R26.2)    Time: 4715-8063 PT Time Calculation (min) (ACUTE ONLY): 28 min   Charges:   PT Evaluation $PT Eval Low Complexity: 1 Low PT Treatments $Therapeutic Activity: 8-22 mins        Lyndel Safe Alizeh Madril PT, DPT, GCS   Ison Wichmann 06/10/2018, 4:25 PM

## 2018-06-10 NOTE — Care Management Obs Status (Signed)
[place] NOTIFICATION   Patient Details  Name: Nicole Bishop MRN: [id number] Date of Birth: 1931-07-08   Medicare Observation Status Notification Given:  Yes    Beverly Sessions, RN 06/10/2018, 11:35 AM

## 2018-06-10 NOTE — Care Management (Signed)
Patient admitted with DVT.  Assessment completed with daughter [name] via telephone.  Patient lives at home alone. Per Daughter patient baseline is A&O.  Patient has a RW, and shower seat in the home.  PCP singh.  Daughter states that her and other family live locally and provide transportation.  Pharmacy [facility].  Denies issues obtaining medications.  Prior to admission patient was prescribed Eliquis.  Daughter states that patient however refused to take the Eliquis because "it makes her feel some kind of way".  Plan to to discharge patient on Xarelto this admission. PT consult pending.  Daughter states that patient has never been at SNF level of care, has had home health in the past "many years ago, but I don't remember who it was". RNCM following.

## 2018-06-10 NOTE — Progress Notes (Signed)
[place] at [place] NAME: Nicole Bishop    MR#:  [id number]  DATE OF BIRTH:  05/16/1931  SUBJECTIVE:   Patient complaining of generalized aches and pains all over and specifically to her right upper extremity.  Still has some significant right lower extremity swelling secondary to DVT.  Seen by vascular surgery and no plans for thrombolysis.  REVIEW OF SYSTEMS:    Review of Systems  Constitutional: Negative for chills and fever.  HENT: Negative for congestion and tinnitus.   Eyes: Negative for blurred vision and double vision.  Respiratory: Negative for cough, shortness of breath and wheezing.   Cardiovascular: Positive for leg swelling (RLE). Negative for chest pain, orthopnea and PND.  Gastrointestinal: Negative for abdominal pain, diarrhea, nausea and vomiting.  Genitourinary: Negative for dysuria and hematuria.  Musculoskeletal: Positive for falls.  Neurological: Negative for dizziness, sensory change and focal weakness.  All other systems reviewed and are negative.   Nutrition: Heart Healthy Tolerating Diet: Yes Tolerating PT: Await Eval.      DRUG ALLERGIES:   Allergies  Allergen Reactions  . Penicillins Rash and Other (See Comments)    Has patient had a PCN reaction causing immediate rash, facial/tongue/throat swelling, SOB or lightheadedness with hypotension: Unknown Has patient had a PCN reaction causing severe rash involving mucus membranes or skin necrosis: Unknown Has patient had a PCN reaction that required hospitalization: Unknown Has patient had a PCN reaction occurring within the last 10 years: Unknown If all of the above answers are "NO", then may proceed with Cephalosporin use.   . Nsaids Other (See Comments)    GI upset  . Atorvastatin Rash  . Celebrex [Celecoxib] Other (See Comments)    Constipation and stomach upset  . Felodipine Nausea Only  . Oxaprozin Nausea Only  . Rofecoxib Other (See Comments)     GI upset  . Simvastatin Rash    VITALS:  Blood pressure (!) 183/92, pulse 64, temperature 98 F (36.7 C), temperature source Oral, resp. rate 16, height 6\' 1"  (1.854 m), weight 115.7 kg, SpO2 100 %.  PHYSICAL EXAMINATION:   Physical Exam  GENERAL:  83 y.o.-year-old patient lying in bed lethargic but in NAD.   EYES: Pupils equal, round, reactive to light and accommodation. No scleral icterus. Extraocular muscles intact.  HEENT: Head atraumatic, normocephalic. Oropharynx and nasopharynx clear.  NECK:  Supple, no jugular venous distention. No thyroid enlargement, no tenderness.  LUNGS: Normal breath sounds bilaterally, no wheezing, rales, rhonchi. No use of accessory muscles of respiration.   CARDIOVASCULAR: S1, S2 normal. No murmurs, rubs, or gallops.  ABDOMEN: Soft, nontender, nondistended. Bowel sounds present. No organomegaly or mass.  EXTREMITIES: No cyanosis, clubbing, RLE edema, warmth  NEUROLOGIC: Cranial nerves II through XII are intact. No focal Motor or sensory deficits b/l.   PSYCHIATRIC: The patient is alert and oriented x 1.  SKIN: No obvious rash, lesion, or ulcer.    LABORATORY PANEL:   CBC Recent Labs  Lab 06/09/18 0616  WBC 4.0  HGB 10.0*  HCT 32.5*  PLT 196   ------------------------------------------------------------------------------------------------------------------  Chemistries  Recent Labs  Lab 06/09/18 0616  NA 142  K 3.7  CL 112*  CO2 24  GLUCOSE 94  BUN 18  CREATININE 0.94  CALCIUM 9.1   ------------------------------------------------------------------------------------------------------------------  Cardiac Enzymes No results for input(s): TROPONINI in the last 168 hours. ------------------------------------------------------------------------------------------------------------------  RADIOLOGY:  Dg Ankle Complete Right  Result Date: 06/08/2018 CLINICAL DATA:  Ankle pain after fall EXAM: RIGHT ANKLE - COMPLETE 3+ VIEW  COMPARISON:  None. FINDINGS: Diffuse soft tissue swelling. No fracture or malalignment. Small plantar calcaneal spur. Ankle mortise is symmetric. IMPRESSION: No acute osseous abnormality. Electronically Signed   By: Donavan Foil M.D.   On: 06/08/2018 21:37   US Venous Img Lower Unilateral Right  Result Date: 06/08/2018 CLINICAL DATA:  Right leg swelling for 1 day EXAM: RIGHT LOWER EXTREMITY VENOUS DOPPLER ULTRASOUND TECHNIQUE: Gray-scale sonography with graded compression, as well as color Doppler and duplex ultrasound were performed to evaluate the lower extremity deep venous systems from the level of the common femoral vein and including the common femoral, femoral, profunda femoral, popliteal and calf veins including the posterior tibial, peroneal and gastrocnemius veins when visible. The superficial great saphenous vein was also interrogated. Spectral Doppler was utilized to evaluate flow at rest and with distal augmentation maneuvers in the common femoral, femoral and popliteal veins. COMPARISON:  11/30/2014 FINDINGS: Contralateral Common Femoral Vein: Respiratory phasicity is normal and symmetric with the symptomatic side. No evidence of thrombus. Normal compressibility. Common Femoral Vein: No evidence of thrombus. Normal compressibility, respiratory phasicity and response to augmentation. Saphenofemoral Junction: No evidence of thrombus. Normal compressibility and flow on color Doppler imaging. Profunda Femoral Vein: No evidence of thrombus. Normal compressibility and flow on color Doppler imaging. Femoral Vein: There is thrombus noted within the right femoral vein, occlusive in the proximal and mid femoral vein and nonocclusive in the distal femoral vein. Popliteal Vein: Nonocclusive thrombus noted in the popliteal vein. Calf Veins: Limited visualization, grossly unremarkable. Superficial Great Saphenous Vein: No evidence of thrombus. Normal compressibility. Venous Reflux:  None. Other Findings:   None. IMPRESSION: Occlusive thrombus noted within the proximal and mid right femoral vein with nonocclusive thrombus in the distal femoral vein and right popliteal vein. Electronically Signed   By: Rolm Baptise M.D.   On: 06/08/2018 23:48   Dg Knee Complete 4 Views Left  Result Date: 06/08/2018 CLINICAL DATA:  Fall, bilateral knee pain EXAM: LEFT KNEE - COMPLETE 4+ VIEW COMPARISON:  12/17/2008 FINDINGS: Changes of left knee replacement. No hardware complicating feature. No fracture, subluxation or dislocation. IMPRESSION: Prior left knee replacement. No complicating feature or acute bony abnormality. Electronically Signed   By: Rolm Baptise M.D.   On: 06/08/2018 20:14   Dg Knee Complete 4 Views Right  Result Date: 06/08/2018 CLINICAL DATA:  Fall, bilateral knee pain EXAM: RIGHT KNEE - COMPLETE 4+ VIEW COMPARISON:  12/17/2008 FINDINGS: Changes of right knee replacement. No hardware complicating feature. No acute bony abnormality. Specifically, no fracture, subluxation, or dislocation. No joint effusion. IMPRESSION: Right knee replacement. No complicating feature. No acute bony abnormality. Electronically Signed   By: Rolm Baptise M.D.   On: 06/08/2018 20:15     ASSESSMENT AND PLAN:   84 year old female with past medical history of hypertension, hypothyroidism, GERD, coronary artery disease, chronic kidney disease stage III, atrial fibrillation who presents to the hospital due to a fall and noted to have significant right lower extremity swelling consistent with a DVT  1.  Right lower extremity swelling redness-this is secondary to an extensive right lower extremity DVT.  Patient was apparently on Eliquis but was not compliant with it and now presents with a DVT. - pt. was on heparin drip but now switched to Xarelto.  Seen by vascular surgery and no plans for thrombolysis.  Continue anticoagulation for now.  2.  Essential hypertension-continue Toprol, IV hydralazine as needed.  3.  History of  atrial fibrillation- rate controlled.  Continue Toprol, now on Xarelto.  4.  Altered mental status/hallucinations-secondary to sundowning and possible underlying dementia. - avoid benzo's and pt is improving.   5. GERD - cont. Protonix.    Await PT eval.     All the records are reviewed and case discussed with Care Management/Social Worker. Management plans discussed with the patient, family and they are in agreement.  CODE STATUS: Full code  DVT Prophylaxis: Xarelto.    TOTAL TIME TAKING CARE OF THIS PATIENT: 30 minutes.   POSSIBLE D/C IN 1-2 DAYS, DEPENDING ON CLINICAL CONDITION.   Henreitta Leber M.D on 06/10/2018 at 1:23 PM  Between 7am to 6pm - Pager - [phone]  After 6pm go to [email or web address] - Proofreader  [facility]  Office  [phone]  CC: Primary care physician; Glendon Axe, MD

## 2018-06-11 ENCOUNTER — Inpatient Hospital Stay: Payer: Medicare HMO

## 2018-06-11 DIAGNOSIS — I82409 Acute embolism and thrombosis of unspecified deep veins of unspecified lower extremity: Secondary | ICD-10-CM | POA: Diagnosis not present

## 2018-06-11 DIAGNOSIS — N183 Chronic kidney disease, stage 3 (moderate): Secondary | ICD-10-CM | POA: Diagnosis not present

## 2018-06-11 DIAGNOSIS — K219 Gastro-esophageal reflux disease without esophagitis: Secondary | ICD-10-CM | POA: Diagnosis not present

## 2018-06-11 DIAGNOSIS — I10 Essential (primary) hypertension: Secondary | ICD-10-CM | POA: Diagnosis not present

## 2018-06-11 DIAGNOSIS — I82411 Acute embolism and thrombosis of right femoral vein: Secondary | ICD-10-CM | POA: Diagnosis not present

## 2018-06-11 LAB — RETICULOCYTES
Immature Retic Fract: 11.9 % (ref 2.3–15.9)
RBC.: 3.75 MIL/uL — AB (ref 3.87–5.11)
Retic Count, Absolute: 49.9 10*3/uL (ref 19.0–186.0)
Retic Ct Pct: 1.3 % (ref 0.4–3.1)

## 2018-06-11 LAB — IRON AND TIBC
Iron: 16 ug/dL — ABNORMAL LOW (ref 28–170)
Saturation Ratios: 6 % — ABNORMAL LOW (ref 10.4–31.8)
TIBC: 274 ug/dL (ref 250–450)
UIBC: 258 ug/dL

## 2018-06-11 LAB — SEDIMENTATION RATE: SED RATE: 54 mm/h — AB (ref 0–30)

## 2018-06-11 LAB — FERRITIN: FERRITIN: 77 ng/mL (ref 11–307)

## 2018-06-11 MED ORDER — METOPROLOL SUCCINATE ER 50 MG PO TB24
50.0000 mg | ORAL_TABLET | Freq: Every day | ORAL | Status: DC
  Administered 2018-06-11 – 2018-06-12 (×2): 50 mg via ORAL
  Filled 2018-06-11 (×2): qty 1

## 2018-06-11 MED ORDER — SODIUM CHLORIDE 0.9 % IV SOLN
200.0000 mg | Freq: Once | INTRAVENOUS | Status: AC
  Administered 2018-06-11: 200 mg via INTRAVENOUS
  Filled 2018-06-11: qty 10

## 2018-06-11 MED ORDER — AMLODIPINE BESYLATE 5 MG PO TABS
5.0000 mg | ORAL_TABLET | Freq: Every day | ORAL | Status: DC
  Administered 2018-06-11 – 2018-06-12 (×2): 5 mg via ORAL
  Filled 2018-06-11 (×2): qty 1

## 2018-06-11 NOTE — Progress Notes (Signed)
[place] at [place] NAME: Nicole Bishop    MR#:  [id number]  DATE OF BIRTH:  Jun 13, 1931  SUBJECTIVE:   Generalized weakness no acute events ovenight  REVIEW OF SYSTEMS:    Review of Systems  Constitutional: Negative for chills and fever.  HENT: Negative for congestion and tinnitus.   Eyes: Negative for blurred vision and double vision.  Respiratory: Negative for cough, shortness of breath and wheezing.   Cardiovascular: Positive for leg swelling (RLE). Negative for chest pain, orthopnea and PND.  Gastrointestinal: Negative for abdominal pain, diarrhea, nausea and vomiting.  Genitourinary: Negative for dysuria and hematuria.  Musculoskeletal: Positive for falls.  Neurological: Negative for dizziness, sensory change and focal weakness.  All other systems reviewed and are negative.   Marland Kitchen      DRUG ALLERGIES:   Allergies  Allergen Reactions  . Penicillins Rash and Other (See Comments)    Has patient had a PCN reaction causing immediate rash, facial/tongue/throat swelling, SOB or lightheadedness with hypotension: Unknown Has patient had a PCN reaction causing severe rash involving mucus membranes or skin necrosis: Unknown Has patient had a PCN reaction that required hospitalization: Unknown Has patient had a PCN reaction occurring within the last 10 years: Unknown If all of the above answers are "NO", then may proceed with Cephalosporin use.   . Nsaids Other (See Comments)    GI upset  . Atorvastatin Rash  . Celebrex [Celecoxib] Other (See Comments)    Constipation and stomach upset  . Felodipine Nausea Only  . Oxaprozin Nausea Only  . Rofecoxib Other (See Comments)    GI upset  . Simvastatin Rash    VITALS:  Blood pressure (!) 189/98, pulse 65, temperature 98.7 F (37.1 C), temperature source Oral, resp. rate 18, height 6\' 1"  (1.854 m), weight 115.7 kg, SpO2 97 %.  PHYSICAL EXAMINATION:   Physical Exam  GENERAL:   84 y.o.-year-old patient lying in bed lethargic but in NAD.   EYES: Pupils equal, round, reactive to light and accommodation. No scleral icterus. Extraocular muscles intact.  HEENT: Head atraumatic, normocephalic. Oropharynx and nasopharynx clear.  NECK:  Supple, no jugular venous distention. No thyroid enlargement, no tenderness.  LUNGS: Normal breath sounds bilaterally, no wheezing, rales, rhonchi. No use of accessory muscles of respiration.   CARDIOVASCULAR: S1, S2 normal. No murmurs, rubs, or gallops.  ABDOMEN: Soft, nontender, nondistended. Bowel sounds present. No organomegaly or mass.  EXTREMITIES: No cyanosis, clubbing, RLE edema, warmth  NEUROLOGIC: Cranial nerves II through XII are intact. No focal Motor or sensory deficits b/l.   PSYCHIATRIC: The patient is alert and oriented x 1.  SKIN: No obvious rash, lesion, or ulcer.    LABORATORY PANEL:   CBC Recent Labs  Lab 06/09/18 0616  WBC 4.0  HGB 10.0*  HCT 32.5*  PLT 196   ------------------------------------------------------------------------------------------------------------------  Chemistries  Recent Labs  Lab 06/09/18 0616  NA 142  K 3.7  CL 112*  CO2 24  GLUCOSE 94  BUN 18  CREATININE 0.94  CALCIUM 9.1   ------------------------------------------------------------------------------------------------------------------  Cardiac Enzymes No results for input(s): TROPONINI in the last 168 hours. ------------------------------------------------------------------------------------------------------------------  RADIOLOGY:  Dg Shoulder Right  Result Date: 06/10/2018 CLINICAL DATA:  Shoulder pain after fall EXAM: RIGHT SHOULDER - 2+ VIEW COMPARISON:  Chest x-ray 10/16/2017 FINDINGS: Moderate AC joint degenerative change. No acute displaced fracture or malalignment. Advanced arthritis at the glenohumeral interval with narrowing, subchondral sclerosis and cyst formation as well  as prominent inferior osteophytes.  IMPRESSION: 1. No acute osseous abnormality 2. Advanced arthritis of the right shoulder Electronically Signed   By: Donavan Foil M.D.   On: 06/10/2018 16:01     ASSESSMENT AND PLAN:   84 year old female with past medical history of hypertension, hypothyroidism, GERD, coronary artery disease, chronic kidney disease stage III, atrial fibrillation who presents to the hospital due to a fall and noted to have significant right lower extremity swelling consistent with a DVT  1.  Right lower extremity swelling redness due to an extensive right lower extremity DVT.  She will continue Xarelto.   Seen by vascular surgery and no plans for thrombolysis.  Continue anticoagulation for now.  2.  Accelerated Essential hypertension: I will restart outpatient dose of Norvasc and increase BB  Continue to monitor blood pressure  3.  History of atrial fibrillation: Heart rate controlled on Toprol. Continue Xarelto for stroke prevention 4.  Altered mental status with auditory hallucinations: This is due to sundowning. She will need to avoid benzo's and pt is improving.   5. GERD: Continue Protonix.    6.  Chronic anemia: Patient will receive IV iron as she is scheduled to have this as an outpatient tomorrow.   Patient will need skilled nursing facility upon discharge All the records are reviewed and case discussed with Care Management/Social Worker.  CODE STATUS: Full code  DVT Prophylaxis: Xarelto.    TOTAL TIME TAKING CARE OF THIS PATIENT: 24 minutes.   POSSIBLE D/C IN 1-2 DAYS, DEPENDING ON CLINICAL CONDITION.   Breda Bond M.D on 06/11/2018 at 10:37 AM  Between 7am to 6pm - Pager - [phone]  After 6pm go to [email or web address] - Proofreader  [facility]  Office  [phone]  CC: Primary care physician; Glendon Axe, MD

## 2018-06-11 NOTE — Progress Notes (Signed)
[facility]   Patient: Nicole Bishop MRN: [id number] DOB: December 07, 1930 Date: 06/11/18  Vital Signs: BP (!) 189/98 (BP Location: Right Arm)   Pulse 65   Temp 98.7 F (37.1 C) (Oral)   Resp 18   Ht 6' 1"  (1.854 m)   Wt 255 lb 1.2 oz (115.7 kg)   SpO2 97%   BMI 33.65 kg/m   Patient's daughter called to inform cancer center staff the patient had been admitted for a DVT.  Of note, patient scheduled to be seen in the hematology clinic on 06/12/2018 for provider assessment and additional iron infusion if needed.  Patient's daughter asking for iron infusion to be given while patient is in the hospital.  Spoke with attending hospitalist Benjie Karvonen, MD), who is amenable to patient receiving iron infusion if needed.  Will place orders for labs and Venofer infusion.  PLAN: 1. Labs today: CBC, creatinine, retic, ferritin, iron studies, ESR 2. Patient to receive Venofer 200 mg IV if ferritin is < 30. Orders placed with this indication in admin instructions.   If there are questions or concerns, please feel free to contact me in the in cancer center. We very much appreciate the collaborative care efforts of MAKINA SKOW. Please let us know if we can be of assistance.   Honor Loh, MSN, APRN, FNP-C, CEN Oncology/Hematology Nurse Practitioner  [facility] 06/11/18, 10:32 AM

## 2018-06-11 NOTE — NC FL2 (Signed)
[place] LEVEL OF CARE SCREENING TOOL     IDENTIFICATION  Patient Name: Nicole Bishop Birthdate: 15-Nov-1930 Sex: female Admission Date (Current Location): 06/08/2018  [place] and [place] Number:  Engineering geologist and Address:  [facility], [address], [place], [place] [address]      Provider Number: [id number]  Attending Physician Name and Address:  Bettey Costa, MD  Relative Name and Phone Number:       Current Level of Care: Hospital Recommended Level of Care: [place] Prior Approval Number:    Date Approved/Denied:   PASRR Number:    Discharge Plan: SNF    Current Diagnoses: Patient Active Problem List   Diagnosis Date Noted  . DVT (deep venous thrombosis) ([place]) 06/09/2018  . Visual hallucinations 07/16/2017  . SOB (shortness of breath) on exertion 10/30/2016  . Depression, major, single episode, complete remission ([place]) 08/03/2016  . Kidney failure 03/17/2015  . Chronic kidney disease (CKD), stage III (moderate) ([place]) 12/22/2014  . Anemia, iron deficiency 12/22/2014  . IDA (iron deficiency anemia) 12/14/2014  . Chronic constipation 06/03/2014  . Essential (primary) hypertension 05/03/2014  . Acid reflux 04/20/2014  . H/O: obesity 04/20/2014  . Airway hyperreactivity 04/20/2014  . Arthritis of shoulder region, degenerative 01/27/2014  . B12 deficiency 12/27/2013  . Dizziness 03/13/2012  . Back ache 11/04/2011  . Avitaminosis D 11/04/2011  . Combined fat and carbohydrate induced hyperlipemia 10/31/2011  . Absolute anemia 10/31/2011  . Depression 05/21/2011  . Hearing loss 05/21/2011    Orientation RESPIRATION BLADDER Height & Weight     Self, Place  Normal Incontinent Weight: 255 lb 1.2 oz (115.7 kg) Height:  6\' 1"  (185.4 cm)  BEHAVIORAL SYMPTOMS/MOOD NEUROLOGICAL BOWEL NUTRITION STATUS  (none) (none) Incontinent Diet  AMBULATORY STATUS COMMUNICATION OF NEEDS Skin   Extensive Assist  Verbally Normal                       Personal Care Assistance Level of Assistance  Bathing, Feeding, Dressing Bathing Assistance: Maximum assistance Feeding assistance: Maximum assistance Dressing Assistance: Maximum assistance     Functional Limitations Info  Hearing   Hearing Info: Impaired      SPECIAL CARE FACTORS FREQUENCY  PT (By licensed PT)                    Contractures Contractures Info: Not present    Additional Factors Info  Code Status, Allergies Code Status Info: full Allergies Info: pcn's;nsaids; celebrex; atorvastatin;oxaprozin;felodine; simvastatin           Current Medications (06/11/2018):  This is the current hospital active medication list Current Facility-Administered Medications  Medication Dose Route Frequency Provider Last Rate Last Dose  . acetaminophen (TYLENOL) tablet 650 mg  650 mg Oral Q6H PRN Lance Coon, MD   650 mg at 06/10/18 1728   Or  . acetaminophen (TYLENOL) suppository 650 mg  650 mg Rectal Q6H PRN Lance Coon, MD      . amLODipine (NORVASC) tablet 5 mg  5 mg Oral Daily Mody, Sital, MD      . hydrALAZINE (APRESOLINE) injection 10 mg  10 mg Intravenous Q4H PRN Lance Coon, MD      . iron sucrose (VENOFER) 200 mg in sodium chloride 0.9 % 150 mL IVPB  200 mg Intravenous Once Honor Loh E, NP      . metoprolol succinate (TOPROL-XL) 24 hr tablet 50 mg  50 mg Oral Daily  Bettey Costa, MD   50 mg at 06/11/18 1049  . ondansetron (ZOFRAN) tablet 4 mg  4 mg Oral Q6H PRN Lance Coon, MD       Or  . ondansetron [facility]) injection 4 mg  4 mg Intravenous Q6H PRN Lance Coon, MD      . oxyCODONE (Oxy IR/ROXICODONE) immediate release tablet 5 mg  5 mg Oral Q4H PRN Lance Coon, MD   5 mg at 06/11/18 0245  . pantoprazole (PROTONIX) EC tablet 40 mg  40 mg Oral Daily Lance Coon, MD   40 mg at 06/11/18 1050  . Rivaroxaban (XARELTO) tablet 15 mg  15 mg Oral BID WC Henreitta Leber, MD   15 mg at 06/11/18 0826  . [START ON  06/30/2018] rivaroxaban (XARELTO) tablet 20 mg  20 mg Oral Q supper Henreitta Leber, MD       Facility-Administered Medications Ordered in Other Encounters  Medication Dose Route Frequency Provider Last Rate Last Dose  . 0.9 %  sodium chloride infusion   Intravenous Continuous Lequita Asal, MD   Stopped at 02/06/18 1429     Discharge Medications: Please see discharge summary for a list of discharge medications.  Relevant Imaging Results:  Relevant Lab Results:   Additional Information SS: [id number]  Shela Leff, LCSW

## 2018-06-11 NOTE — Clinical Social Work Note (Signed)
Clinical Social Work Assessment  Patient Details  Name: Nicole Bishop MRN: [id number] Date of Birth: 1931/05/17  Date of referral:  06/11/18               Reason for consult:  Discharge Planning                Permission sought to share information with:  Facility Sport and exercise psychologist, Family Supports Permission granted to share information::  Yes, Verbal Permission Granted  Name::        Agency::     Relationship::     Contact Information:     Housing/Transportation Living arrangements for the past 2 months:  Single Family Home Source of Information:  Patient, Adult Children Patient Interpreter Needed:  None Criminal Activity/Legal Involvement Pertinent to Current Situation/Hospitalization:  No - Comment as needed Significant Relationships:  Adult Children Lives with:  Self Do you feel safe going back to the place where you live?  Yes Need for family participation in patient care:  Yes (Comment)  Care giving concerns:  CSW resides at home alone.    Social Worker assessment / plan:  CSW met with patient and daughter: Nicole Bishop: [phone]. Patient was pleasantly confused. CSW discussed the recommendation by PT for short term rehab. Patient and daughter are in agreement with rehab and daughter prefers [facility]. Bed search conducted. [facility] was able to offer and daughter accepted. Tina at [facility] is aware and they will work on obtaining [place] from [place].   Employment status:  Retired Nurse, adult PT Recommendations:  [place] / Referral to community resources:     Patient/Family's Response to care:  Patient's daughter verbalized appreciation for CSW assistance.  Patient/Family's Understanding of and Emotional Response to Diagnosis, Current Treatment, and Prognosis:  Patient's daughter was very happy that they got the bed offer they preferred.  Emotional Assessment Appearance:  Appears stated  age Attitude/Demeanor/Rapport:  (pleasant) Affect (typically observed):  Calm Orientation:  Oriented to Self, Oriented to Place Alcohol / Substance use:  Not Applicable Psych involvement (Current and /or in the community):  No (Comment)  Discharge Needs  Concerns to be addressed:  Care Coordination Readmission within the last 30 days:  No Current discharge risk:  None Barriers to Discharge:  No Barriers Identified   Shela Leff, LCSW 06/11/2018, 8:46 PM

## 2018-06-11 NOTE — Progress Notes (Signed)
[facility]    No vascular intervention planned as inpatient.  Patient to follow up as outpatient.  Will place in discharge instructions. Vascular surgery to sign off at this time.  Please reconsult as needed.    Marcelle Overlie PA-C 06/11/2018 11:13 AM

## 2018-06-12 ENCOUNTER — Inpatient Hospital Stay: Payer: Medicare HMO | Admitting: Urgent Care

## 2018-06-12 ENCOUNTER — Inpatient Hospital Stay: Payer: Medicare HMO

## 2018-06-12 DIAGNOSIS — R0683 Snoring: Secondary | ICD-10-CM | POA: Diagnosis not present

## 2018-06-12 DIAGNOSIS — K5900 Constipation, unspecified: Secondary | ICD-10-CM | POA: Diagnosis not present

## 2018-06-12 DIAGNOSIS — D508 Other iron deficiency anemias: Secondary | ICD-10-CM | POA: Diagnosis not present

## 2018-06-12 DIAGNOSIS — F05 Delirium due to known physiological condition: Secondary | ICD-10-CM | POA: Diagnosis not present

## 2018-06-12 DIAGNOSIS — I824Y1 Acute embolism and thrombosis of unspecified deep veins of right proximal lower extremity: Secondary | ICD-10-CM | POA: Diagnosis not present

## 2018-06-12 DIAGNOSIS — K5909 Other constipation: Secondary | ICD-10-CM | POA: Diagnosis not present

## 2018-06-12 DIAGNOSIS — S8002XA Contusion of left knee, initial encounter: Secondary | ICD-10-CM | POA: Diagnosis not present

## 2018-06-12 DIAGNOSIS — D509 Iron deficiency anemia, unspecified: Secondary | ICD-10-CM | POA: Diagnosis not present

## 2018-06-12 DIAGNOSIS — M6281 Muscle weakness (generalized): Secondary | ICD-10-CM | POA: Diagnosis not present

## 2018-06-12 DIAGNOSIS — I82511 Chronic embolism and thrombosis of right femoral vein: Secondary | ICD-10-CM | POA: Diagnosis not present

## 2018-06-12 DIAGNOSIS — S8001XA Contusion of right knee, initial encounter: Secondary | ICD-10-CM | POA: Diagnosis not present

## 2018-06-12 DIAGNOSIS — I82409 Acute embolism and thrombosis of unspecified deep veins of unspecified lower extremity: Secondary | ICD-10-CM | POA: Diagnosis not present

## 2018-06-12 DIAGNOSIS — N189 Chronic kidney disease, unspecified: Secondary | ICD-10-CM | POA: Diagnosis not present

## 2018-06-12 DIAGNOSIS — Z6836 Body mass index (BMI) 36.0-36.9, adult: Secondary | ICD-10-CM | POA: Diagnosis not present

## 2018-06-12 DIAGNOSIS — M79674 Pain in right toe(s): Secondary | ICD-10-CM | POA: Diagnosis not present

## 2018-06-12 DIAGNOSIS — Z79899 Other long term (current) drug therapy: Secondary | ICD-10-CM | POA: Diagnosis not present

## 2018-06-12 DIAGNOSIS — Z7401 Bed confinement status: Secondary | ICD-10-CM | POA: Diagnosis not present

## 2018-06-12 DIAGNOSIS — Z86718 Personal history of other venous thrombosis and embolism: Secondary | ICD-10-CM | POA: Diagnosis not present

## 2018-06-12 DIAGNOSIS — Z7901 Long term (current) use of anticoagulants: Secondary | ICD-10-CM | POA: Diagnosis not present

## 2018-06-12 DIAGNOSIS — I872 Venous insufficiency (chronic) (peripheral): Secondary | ICD-10-CM | POA: Diagnosis not present

## 2018-06-12 DIAGNOSIS — D649 Anemia, unspecified: Secondary | ICD-10-CM | POA: Diagnosis not present

## 2018-06-12 DIAGNOSIS — K219 Gastro-esophageal reflux disease without esophagitis: Secondary | ICD-10-CM | POA: Diagnosis not present

## 2018-06-12 DIAGNOSIS — I890 Lymphedema, not elsewhere classified: Secondary | ICD-10-CM | POA: Diagnosis not present

## 2018-06-12 DIAGNOSIS — I82401 Acute embolism and thrombosis of unspecified deep veins of right lower extremity: Secondary | ICD-10-CM | POA: Diagnosis not present

## 2018-06-12 DIAGNOSIS — I82411 Acute embolism and thrombosis of right femoral vein: Secondary | ICD-10-CM | POA: Diagnosis not present

## 2018-06-12 DIAGNOSIS — N183 Chronic kidney disease, stage 3 (moderate): Secondary | ICD-10-CM | POA: Diagnosis not present

## 2018-06-12 DIAGNOSIS — M791 Myalgia, unspecified site: Secondary | ICD-10-CM | POA: Diagnosis not present

## 2018-06-12 DIAGNOSIS — R4182 Altered mental status, unspecified: Secondary | ICD-10-CM | POA: Diagnosis not present

## 2018-06-12 DIAGNOSIS — M25571 Pain in right ankle and joints of right foot: Secondary | ICD-10-CM | POA: Diagnosis not present

## 2018-06-12 DIAGNOSIS — I10 Essential (primary) hypertension: Secondary | ICD-10-CM | POA: Diagnosis not present

## 2018-06-12 DIAGNOSIS — I129 Hypertensive chronic kidney disease with stage 1 through stage 4 chronic kidney disease, or unspecified chronic kidney disease: Secondary | ICD-10-CM | POA: Diagnosis not present

## 2018-06-12 DIAGNOSIS — B351 Tinea unguium: Secondary | ICD-10-CM | POA: Diagnosis not present

## 2018-06-12 DIAGNOSIS — I4891 Unspecified atrial fibrillation: Secondary | ICD-10-CM | POA: Diagnosis not present

## 2018-06-12 DIAGNOSIS — R5383 Other fatigue: Secondary | ICD-10-CM | POA: Diagnosis not present

## 2018-06-12 DIAGNOSIS — T7849XA Other allergy, initial encounter: Secondary | ICD-10-CM | POA: Diagnosis not present

## 2018-06-12 DIAGNOSIS — M79675 Pain in left toe(s): Secondary | ICD-10-CM | POA: Diagnosis not present

## 2018-06-12 DIAGNOSIS — E669 Obesity, unspecified: Secondary | ICD-10-CM | POA: Diagnosis not present

## 2018-06-12 MED ORDER — RIVAROXABAN 15 MG PO TABS: 15.0000 mg | ORAL_TABLET | Freq: Two times a day (BID) | ORAL | 0 refills | Status: DC

## 2018-06-12 MED ORDER — RIVAROXABAN 20 MG PO TABS: 20.0000 mg | ORAL_TABLET | Freq: Every day | ORAL | 0 refills | Status: DC

## 2018-06-12 MED ORDER — METOPROLOL SUCCINATE ER 50 MG PO TB24: 50.0000 mg | ORAL_TABLET | Freq: Every day | ORAL | 0 refills | Status: DC

## 2018-06-12 NOTE — Progress Notes (Addendum)
Patient will be going to [facility] post discharge from [facility]. Report was called to [facility] and given to [employer], LPN. Patient will be transported by EMS and her son has been notified. Discharge teaching given to patient, patient verbalized understanding may need reinforcement.  Patient IV removed. All patient belongings gathered prior to leaving.

## 2018-06-12 NOTE — Progress Notes (Signed)
Nurse called to patient's room by nurse tech and told by nurse tech that the patient was having chest pain. Upon assessing the patient nurse was told by patient " it started after I ate, it's gone now". Nurse asked patient " so it's not hurting right now, it went away"? Patient stated " yes it's not hurting now". Nurse educated patient on notifying nurse or nurse tech if pain came back, patient verbalized understanding.

## 2018-06-12 NOTE — Progress Notes (Signed)
Physical Therapy Treatment Patient Details Name: Nicole Bishop MRN: [id number] DOB: 04/22/1931 Today's Date: 06/12/2018    History of Present Illness Nicole Bishop is a 84 y.o. female who presents with chief complaint as above.  Patient had a fall at home tonight and presented to the ED for evaluation.  She has had some pain in her right lower extremity and some swelling in that leg.  Ultrasound shows occlusive thrombus in her right lower extremity. Patient has been on Eliquis for A. fib, but has likely been missing doses.  She states that she did not tolerate Eliquis well as it made her feel bad, and she stopped taking it for about a month.  She was then restarted on it by her physician, but her daughter was at bedside feels like she may very well be missing doses.    PT Comments    Improved R LE pain noted this date; able to indep mobilize throughout functional range, all planes.  Completes bed mobility with min/mod assist +1; sit/stand, basic transfers and lateral stepping (5') edge of bed with RW, mod assist +2 for safety.  Once upright, maintains balance with min assist +1.  Does require elevated bed surface to complete sit/stand and basic transfers.  Additional mobility efforts declined due to R shoulder pain (8/10); poor tolerance for active use/ROM, WBing, requiring self-assist for UE placement/use. Good progress from previous session; still unsafe for discharge home alone.  Continue to recommend STR at discharge.   Follow Up Recommendations  SNF     Equipment Recommendations  None recommended by PT    Recommendations for Other Services       Precautions / Restrictions Precautions Precautions: Fall Restrictions Weight Bearing Restrictions: No    Mobility  Bed Mobility Overal bed mobility: Needs Assistance Bed Mobility: Supine to Sit;Sit to Supine     Supine to sit: Min assist Sit to supine: Mod assist;Max assist;+2 for safety/equipment   General bed mobility comments:  improved movement of LEs; difficulty actively utilizing R UE due t pain  Transfers Overall transfer level: Needs assistance Equipment used: Rolling walker (2 wheeled) Transfers: Sit to/from Stand Sit to Stand: +2 safety/equipment;Mod assist         General transfer comment: hand-over-hand assist to position R UE on walker (due to shoulder pain).  Requires elevated surface height, mod assit +2 for lift off, but able to achieve full stance (x4) this date  Ambulation/Gait Ambulation/Gait assistance: Min assist;Mod assist;+2 safety/equipment Gait Distance (Feet): 5 Feet Assistive device: Rolling walker (2 wheeled)       General Gait Details: lateral stepping towards head of bed with RW, min/mod assist +2 for safety.  Decreased step height/length; forward trunk flexion.  Limited balance, activity tolerance. Unable to tolerate additional distance due to pain (R shoulder), fatigue   Stairs             Wheelchair Mobility    Modified Rankin (Stroke Patients Only)       Balance Overall balance assessment: Needs assistance Sitting-balance support: No upper extremity supported;Feet supported Sitting balance-Leahy Scale: Good     Standing balance support: Bilateral upper extremity supported Standing balance-Leahy Scale: Fair                              Cognition Arousal/Alertness: Awake/alert Behavior During Therapy: WFL for tasks assessed/performed Overall Cognitive Status: Within Functional Limits for tasks assessed  Exercises Other Exercises Other Exercises: Sit/stand x4 with RW, mod assist +2 from elevated bed surface.  Broad BOS, hand-over-hand assist for R UE placement; very limited tolerance for active use, WBing R LE Other Exercises: Static standing balance with RW, min assist +2 for safety.  dep assist +2 for hygiene, clothing management, linen change after incontinent bladder episode     General Comments        Pertinent Vitals/Pain Pain Assessment: Faces Faces Pain Scale: Hurts whole lot Pain Location: R shoulder Pain Descriptors / Indicators: Aching;Grimacing;Guarding Pain Intervention(s): Limited activity within patient's tolerance;Monitored during session;Repositioned;Patient requesting pain meds-RN notified;RN gave pain meds during session    Home Living                      Prior Function            PT Goals (current goals can now be found in the care plan section) Acute Rehab PT Goals Patient Stated Goal: Return to prior function PT Goal Formulation: With patient Time For Goal Achievement: 06/24/18 Potential to Achieve Goals: Good Progress towards PT goals: Progressing toward goals    Frequency    Min 2X/week      PT Plan Current plan remains appropriate    Co-evaluation              AM-PAC PT "6 Clicks" Daily Activity  Outcome Measure  Difficulty turning over in bed (including adjusting bedclothes, sheets and blankets)?: A Lot Difficulty moving from lying on back to sitting on the side of the bed? : A Lot Difficulty sitting down on and standing up from a chair with arms (e.g., wheelchair, bedside commode, etc,.)?: Unable Help needed moving to and from a bed to chair (including a wheelchair)?: A Lot Help needed walking in hospital room?: A Lot Help needed climbing 3-5 steps with a railing? : Total 6 Click Score: 10    End of Session Equipment Utilized During Treatment: Gait belt Activity Tolerance: Patient tolerated treatment well Patient left: in bed;with bed alarm set;with call bell/phone within reach;with family/visitor present Nurse Communication: Mobility status PT Visit Diagnosis: Unsteadiness on feet (R26.81);Muscle weakness (generalized) (M62.81);History of falling (Z91.81);Difficulty in walking, not elsewhere classified (R26.2)     Time: 4037-5436 PT Time Calculation (min) (ACUTE ONLY): 27 min  Charges:   $Therapeutic Activity: 23-37 mins                    Danaya Geddis H. Owens Shark, PT, DPT, NCS 06/12/18, 3:09 PM [phone]

## 2018-06-12 NOTE — Clinical Social Work Placement (Signed)
   CLINICAL SOCIAL WORK PLACEMENT  NOTE  Date:  06/12/2018  Patient Details  Name: Nicole Bishop MRN: [id number] Date of Birth: October 05, 1930  Clinical Social Work is seeking post-discharge placement for this patient at the [place] level of care (*CSW will initial, date and re-position this form in  chart as items are completed):  Yes   Patient/family provided with [facility] Work Department's list of facilities offering this level of care within the geographic area requested by the patient (or if unable, by the patient's family).  Yes   Patient/family informed of their freedom to choose among providers that offer the needed level of care, that participate in Medicare, Medicaid or managed care program needed by the patient, have an available bed and are willing to accept the patient.  Yes   Patient/family informed of [facility]'s ownership interest in [facility] and [facility], as well as of the fact that they are under no obligation to receive care at these facilities.  PASRR submitted to EDS on 06/12/18     PASRR number received on 06/12/18     Existing PASRR number confirmed on       FL2 transmitted to all facilities in geographic area requested by pt/family on 06/11/18     FL2 transmitted to all facilities within larger geographic area on       Patient informed that his/her managed care company has contracts with or will negotiate with certain facilities, including the following:        Yes   Patient/family informed of bed offers received.  Patient chooses bed at ([facility])     Physician recommends and patient chooses bed at [facility])    Patient to be transferred to ([facility]) on 06/12/18.  Patient to be transferred to facility by (EMS)     Patient family notified on 06/12/18 of transfer.  Name of family member notified:  (daughter)     PHYSICIAN       Additional Comment:    _______________________________________________ Shela Leff, LCSW 06/12/2018, 3:39 PM

## 2018-06-12 NOTE — Clinical Social Work Note (Signed)
[facility] has received insurance auth. [facility] has the discharge information. Patient's daughter is aware and wishes for EMS transport. Shela Leff MsW,LCSW [phone]

## 2018-06-12 NOTE — Discharge Summary (Signed)
[facility] [place] NAME: Nicole Bishop    MR#:  [id number]  DATE OF BIRTH:  January 22, 1931  DATE OF ADMISSION:  06/08/2018 ADMITTING PHYSICIAN: Lance Coon, MD  DATE OF DISCHARGE: 06/12/2018  PRIMARY CARE PHYSICIAN: Glendon Axe, MD    ADMISSION DIAGNOSIS:  Leg swelling [M79.89] Contusion of right knee, initial encounter [S80.01XA] Contusion of left knee, initial encounter [S80.02XA] Fall, initial encounter [W19.XXXA] Acute right ankle pain [M25.571] Acute deep vein thrombosis (DVT) of right femoral vein (HCC) [I82.411]  DISCHARGE DIAGNOSIS:  Principal Problem:   DVT (deep venous thrombosis) (HCC) Active Problems:   Depression   Chronic kidney disease (CKD), stage III (moderate) (HCC)   Essential (primary) hypertension   Acid reflux   SECONDARY DIAGNOSIS:   Past Medical History:  Diagnosis Date  . Anemia   . Anxiety   . Arthritis   . Chronic kidney disease   . Coronary artery disease   . Depression   . Dizziness   . Dyspnea   . Dysrhythmia    gets tachy if anxious or excited  . GERD (gastroesophageal reflux disease)   . History of hiatal hernia   . HOH (hard of hearing)   . Hypertension   . Hypothyroidism   . IDA (iron deficiency anemia) 12/14/2014  . Myocardial infarction ([place]) 1992  . Renal insufficiency   . Sleep apnea   . Tremors of nervous system     HOSPITAL COURSE:   84 year old female with past medical history of hypertension, hypothyroidism, GERD, coronary artery disease, chronic kidney disease stage III, atrial fibrillation who presents to the hospital due to a fall and noted to have significant right lower extremity swelling consistent with a DVT  1.  Right lower extremity swelling redness due to an extensive right lower extremity DVT.  She will continue Xarelto.   Currently she will be on 20 mg of Xarelto through November 11 and then on November 12 she will start 15 mg p.o. twice daily. She was  evaluated by vascular surgery and there were no plans for thrombolysis.   Will have follow-up with vascular surgery as an outpatient..  2.   Essential hypertension: Pressure has responded to increased dose of metoprolol and she will continue Norvasc.  3.  History of atrial fibrillation: Heart rate is controlled on Toprol. Continue Xarelto for stroke prevention.  4.  Altered mental status with auditory hallucinations: This is due to sundowning. Mental status is at baseline.  5. GERD: Continue Protonix.    6.  Chronic anemia with iron deficiency: Patient received IV iron as she was scheduled to have this as an outpatient.  SHe would benefit from outpatient palliative care services. DISCHARGE CONDITIONS AND DIET:   Stable for discharge on regular diet  CONSULTS OBTAINED:    DRUG ALLERGIES:   Allergies  Allergen Reactions  . Penicillins Rash and Other (See Comments)    Has patient had a PCN reaction causing immediate rash, facial/tongue/throat swelling, SOB or lightheadedness with hypotension: Unknown Has patient had a PCN reaction causing severe rash involving mucus membranes or skin necrosis: Unknown Has patient had a PCN reaction that required hospitalization: Unknown Has patient had a PCN reaction occurring within the last 10 years: Unknown If all of the above answers are "NO", then may proceed with Cephalosporin use.   . Nsaids Other (See Comments)    GI upset  . Atorvastatin Rash  . Celebrex [Celecoxib] Other (See Comments)    Constipation and stomach  upset  . Felodipine Nausea Only  . Oxaprozin Nausea Only  . Rofecoxib Other (See Comments)    GI upset  . Simvastatin Rash    DISCHARGE MEDICATIONS:   Allergies as of 06/12/2018      Reactions   Penicillins Rash, Other (See Comments)   Has patient had a PCN reaction causing immediate rash, facial/tongue/throat swelling, SOB or lightheadedness with hypotension: Unknown Has patient had a PCN reaction causing severe  rash involving mucus membranes or skin necrosis: Unknown Has patient had a PCN reaction that required hospitalization: Unknown Has patient had a PCN reaction occurring within the last 10 years: Unknown If all of the above answers are "NO", then may proceed with Cephalosporin use.   Nsaids Other (See Comments)   GI upset   Atorvastatin Rash   Celebrex [celecoxib] Other (See Comments)   Constipation and stomach upset   Felodipine Nausea Only   Oxaprozin Nausea Only   Rofecoxib Other (See Comments)   GI upset   Simvastatin Rash      Medication List    STOP taking these medications   apixaban 5 MG Tabs tablet Commonly known as:  ELIQUIS   benazepril 5 MG tablet Commonly known as:  LOTENSIN   traMADol-acetaminophen 37.5-325 MG tablet Commonly known as:  ULTRACET     TAKE these medications   amLODipine 5 MG tablet Commonly known as:  NORVASC Take 5 mg by mouth daily.   calcitRIOL 0.25 MCG capsule Commonly known as:  ROCALTROL Take 1 capsule by mouth daily.   CONSTULOSE 10 GM/15ML solution Generic drug:  lactulose Take 10 g by mouth daily as needed for mild constipation.   CYANOCOBALAMIN PO Take 1,000 mcg by mouth daily.   docusate sodium 100 MG capsule Commonly known as:  COLACE Take 100 mg by mouth 2 (two) times daily.   esomeprazole 40 MG capsule Commonly known as:  NEXIUM Take 40 mg by mouth daily as needed (acid reflux).   loratadine 10 MG tablet Commonly known as:  CLARITIN Take 1 tablet (10 mg total) by mouth daily.   metoprolol succinate 50 MG 24 hr tablet Commonly known as:  TOPROL-XL Take 1 tablet (50 mg total) by mouth daily. Take with or immediately following a meal. What changed:    medication strength  how much to take  additional instructions   mometasone 50 MCG/ACT nasal spray Commonly known as:  NASONEX Place 2 sprays into the nose daily as needed (allergies).   olopatadine 0.1 % ophthalmic solution Commonly known as:  PATANOL Place  1 drop into both eyes 2 (two) times daily as needed for allergies.   polyethylene glycol packet Commonly known as:  MIRALAX / GLYCOLAX Take 17 g by mouth daily as needed for mild constipation or moderate constipation.   Rivaroxaban 15 MG Tabs tablet Commonly known as:  XARELTO Take 1 tablet (15 mg total) by mouth 2 (two) times daily with a meal for 17 days. Through 06/29/2018 then 15 mg daily   rivaroxaban 20 MG Tabs tablet Commonly known as:  XARELTO Take 1 tablet (20 mg total) by mouth daily with supper. Start taking on:  06/30/2018   SLOW FE 142 (45 Fe) MG Tbcr Generic drug:  Ferrous Sulfate Take 1 tablet by mouth daily.   Vitamin D3 2000 units capsule Take 4,000 Units by mouth daily.         Today   CHIEF COMPLAINT:   No acute events overnight   VITAL SIGNS:  Blood pressure Marland Kitchen)  145/71, pulse 64, temperature 99.7 F (37.6 C), temperature source Oral, resp. rate (!) 22, height 6\' 1"  (1.854 m), weight 115.7 kg, SpO2 95 %.   REVIEW OF SYSTEMS:  Review of Systems  Constitutional: Negative.  Negative for chills, fever and malaise/fatigue.  HENT: Negative.  Negative for ear discharge, ear pain, hearing loss, nosebleeds and sore throat.   Eyes: Negative.  Negative for blurred vision and pain.  Respiratory: Negative.  Negative for cough, hemoptysis, shortness of breath and wheezing.   Cardiovascular: Negative.  Negative for chest pain, palpitations and leg swelling.  Gastrointestinal: Negative.  Negative for abdominal pain, blood in stool, diarrhea, nausea and vomiting.  Genitourinary: Negative.  Negative for dysuria.  Musculoskeletal: Negative.  Negative for back pain.  Skin: Negative.   Neurological: Negative for dizziness, tremors, speech change, focal weakness, seizures and headaches.  Endo/Heme/Allergies: Negative.  Does not bruise/bleed easily.  Psychiatric/Behavioral: Negative.  Negative for depression, hallucinations and suicidal ideas.     PHYSICAL  EXAMINATION:  GENERAL:  85 y.o.-year-old patient lying in the bed with no acute distress.  NECK:  Supple, no jugular venous distention. No thyroid enlargement, no tenderness.  LUNGS: Normal breath sounds bilaterally, no wheezing, rales,rhonchi  No use of accessory muscles of respiration.  CARDIOVASCULAR: S1, S2 normal. No murmurs, rubs, or gallops.  ABDOMEN: Soft, non-tender, non-distended. Bowel sounds present. No organomegaly or mass.  EXTREMITIES: Right lower edema has improved no cyanosis, or clubbing.  PSYCHIATRIC: The patient is alert and oriented x name not place or time SKIN: No obvious rash, lesion, or ulcer.   DATA REVIEW:   CBC Recent Labs  Lab 06/09/18 0616  WBC 4.0  HGB 10.0*  HCT 32.5*  PLT 196    Chemistries  Recent Labs  Lab 06/09/18 0616  NA 142  K 3.7  CL 112*  CO2 24  GLUCOSE 94  BUN 18  CREATININE 0.94  CALCIUM 9.1    Cardiac Enzymes No results for input(s): TROPONINI in the last 168 hours.  Microbiology Results  @MICRORSLT48 @  RADIOLOGY:  Dg Shoulder Right  Result Date: 06/10/2018 CLINICAL DATA:  Shoulder pain after fall EXAM: RIGHT SHOULDER - 2+ VIEW COMPARISON:  Chest x-ray 10/16/2017 FINDINGS: Moderate AC joint degenerative change. No acute displaced fracture or malalignment. Advanced arthritis at the glenohumeral interval with narrowing, subchondral sclerosis and cyst formation as well as prominent inferior osteophytes. IMPRESSION: 1. No acute osseous abnormality 2. Advanced arthritis of the right shoulder Electronically Signed   By: Donavan Foil M.D.   On: 06/10/2018 16:01      Allergies as of 06/12/2018      Reactions   Penicillins Rash, Other (See Comments)   Has patient had a PCN reaction causing immediate rash, facial/tongue/throat swelling, SOB or lightheadedness with hypotension: Unknown Has patient had a PCN reaction causing severe rash involving mucus membranes or skin necrosis: Unknown Has patient had a PCN reaction that  required hospitalization: Unknown Has patient had a PCN reaction occurring within the last 10 years: Unknown If all of the above answers are "NO", then may proceed with Cephalosporin use.   Nsaids Other (See Comments)   GI upset   Atorvastatin Rash   Celebrex [celecoxib] Other (See Comments)   Constipation and stomach upset   Felodipine Nausea Only   Oxaprozin Nausea Only   Rofecoxib Other (See Comments)   GI upset   Simvastatin Rash      Medication List    STOP taking these medications   apixaban 5 MG  Tabs tablet Commonly known as:  ELIQUIS   benazepril 5 MG tablet Commonly known as:  LOTENSIN   traMADol-acetaminophen 37.5-325 MG tablet Commonly known as:  ULTRACET     TAKE these medications   amLODipine 5 MG tablet Commonly known as:  NORVASC Take 5 mg by mouth daily.   calcitRIOL 0.25 MCG capsule Commonly known as:  ROCALTROL Take 1 capsule by mouth daily.   CONSTULOSE 10 GM/15ML solution Generic drug:  lactulose Take 10 g by mouth daily as needed for mild constipation.   CYANOCOBALAMIN PO Take 1,000 mcg by mouth daily.   docusate sodium 100 MG capsule Commonly known as:  COLACE Take 100 mg by mouth 2 (two) times daily.   esomeprazole 40 MG capsule Commonly known as:  NEXIUM Take 40 mg by mouth daily as needed (acid reflux).   loratadine 10 MG tablet Commonly known as:  CLARITIN Take 1 tablet (10 mg total) by mouth daily.   metoprolol succinate 50 MG 24 hr tablet Commonly known as:  TOPROL-XL Take 1 tablet (50 mg total) by mouth daily. Take with or immediately following a meal. What changed:    medication strength  how much to take  additional instructions   mometasone 50 MCG/ACT nasal spray Commonly known as:  NASONEX Place 2 sprays into the nose daily as needed (allergies).   olopatadine 0.1 % ophthalmic solution Commonly known as:  PATANOL Place 1 drop into both eyes 2 (two) times daily as needed for allergies.   polyethylene glycol  packet Commonly known as:  MIRALAX / GLYCOLAX Take 17 g by mouth daily as needed for mild constipation or moderate constipation.   Rivaroxaban 15 MG Tabs tablet Commonly known as:  XARELTO Take 1 tablet (15 mg total) by mouth 2 (two) times daily with a meal for 17 days. Through 06/29/2018 then 15 mg daily   rivaroxaban 20 MG Tabs tablet Commonly known as:  XARELTO Take 1 tablet (20 mg total) by mouth daily with supper. Start taking on:  06/30/2018   SLOW FE 142 (45 Fe) MG Tbcr Generic drug:  Ferrous Sulfate Take 1 tablet by mouth daily.   Vitamin D3 2000 units capsule Take 4,000 Units by mouth daily.          Management plans discussed with the patient and she is in agreement. Stable for discharge   Patient should follow up with pcp  CODE STATUS:     Code Status Orders  (From admission, onward)         Start     Ordered   06/09/18 0143  Full code  Continuous     06/09/18 0142        Code Status History    This patient has a current code status but no historical code status.      TOTAL TIME TAKING CARE OF THIS PATIENT: 38 minutes.    Note: This dictation was prepared with Dragon dictation along with smaller phrase technology. Any transcriptional errors that result from this process are unintentional.  Chantale Leugers M.D on 06/12/2018 at 10:15 AM  Between 7am to 6pm - Pager - [phone] After 6pm go to [email or web address] - password [email or web address]  Office  [phone]  CC: Primary care physician; Glendon Axe, MD

## 2018-06-14 DIAGNOSIS — N183 Chronic kidney disease, stage 3 (moderate): Secondary | ICD-10-CM | POA: Diagnosis not present

## 2018-06-14 DIAGNOSIS — M791 Myalgia, unspecified site: Secondary | ICD-10-CM | POA: Diagnosis not present

## 2018-06-14 DIAGNOSIS — D649 Anemia, unspecified: Secondary | ICD-10-CM | POA: Diagnosis not present

## 2018-06-14 DIAGNOSIS — I10 Essential (primary) hypertension: Secondary | ICD-10-CM | POA: Diagnosis not present

## 2018-06-14 DIAGNOSIS — I82401 Acute embolism and thrombosis of unspecified deep veins of right lower extremity: Secondary | ICD-10-CM | POA: Diagnosis not present

## 2018-06-14 DIAGNOSIS — K5900 Constipation, unspecified: Secondary | ICD-10-CM | POA: Diagnosis not present

## 2018-06-19 ENCOUNTER — Encounter: Payer: Self-pay | Admitting: Hematology and Oncology

## 2018-06-19 ENCOUNTER — Inpatient Hospital Stay: Payer: Medicare HMO | Attending: Hematology and Oncology | Admitting: Hematology and Oncology

## 2018-06-19 VITALS — BP 152/83 | HR 71 | Temp 96.5°F | Resp 18 | Wt 268.2 lb

## 2018-06-19 DIAGNOSIS — K219 Gastro-esophageal reflux disease without esophagitis: Secondary | ICD-10-CM | POA: Diagnosis not present

## 2018-06-19 DIAGNOSIS — Z86718 Personal history of other venous thrombosis and embolism: Secondary | ICD-10-CM

## 2018-06-19 DIAGNOSIS — N189 Chronic kidney disease, unspecified: Secondary | ICD-10-CM | POA: Diagnosis not present

## 2018-06-19 DIAGNOSIS — E538 Deficiency of other specified B group vitamins: Secondary | ICD-10-CM

## 2018-06-19 DIAGNOSIS — B351 Tinea unguium: Secondary | ICD-10-CM | POA: Diagnosis not present

## 2018-06-19 DIAGNOSIS — N183 Chronic kidney disease, stage 3 unspecified: Secondary | ICD-10-CM

## 2018-06-19 DIAGNOSIS — I82411 Acute embolism and thrombosis of right femoral vein: Secondary | ICD-10-CM | POA: Diagnosis not present

## 2018-06-19 DIAGNOSIS — I10 Essential (primary) hypertension: Secondary | ICD-10-CM | POA: Insufficient documentation

## 2018-06-19 DIAGNOSIS — D649 Anemia, unspecified: Secondary | ICD-10-CM | POA: Diagnosis not present

## 2018-06-19 DIAGNOSIS — Z7901 Long term (current) use of anticoagulants: Secondary | ICD-10-CM

## 2018-06-19 DIAGNOSIS — Z6836 Body mass index (BMI) 36.0-36.9, adult: Secondary | ICD-10-CM | POA: Diagnosis not present

## 2018-06-19 DIAGNOSIS — R0683 Snoring: Secondary | ICD-10-CM | POA: Diagnosis not present

## 2018-06-19 DIAGNOSIS — Z79899 Other long term (current) drug therapy: Secondary | ICD-10-CM | POA: Insufficient documentation

## 2018-06-19 DIAGNOSIS — K5900 Constipation, unspecified: Secondary | ICD-10-CM | POA: Diagnosis not present

## 2018-06-19 DIAGNOSIS — I82401 Acute embolism and thrombosis of unspecified deep veins of right lower extremity: Secondary | ICD-10-CM | POA: Diagnosis not present

## 2018-06-19 DIAGNOSIS — D509 Iron deficiency anemia, unspecified: Secondary | ICD-10-CM | POA: Diagnosis not present

## 2018-06-19 DIAGNOSIS — M6281 Muscle weakness (generalized): Secondary | ICD-10-CM | POA: Diagnosis not present

## 2018-06-19 DIAGNOSIS — E669 Obesity, unspecified: Secondary | ICD-10-CM | POA: Diagnosis not present

## 2018-06-19 DIAGNOSIS — I4891 Unspecified atrial fibrillation: Secondary | ICD-10-CM | POA: Diagnosis not present

## 2018-06-19 DIAGNOSIS — K5909 Other constipation: Secondary | ICD-10-CM | POA: Diagnosis not present

## 2018-06-19 DIAGNOSIS — M25571 Pain in right ankle and joints of right foot: Secondary | ICD-10-CM | POA: Diagnosis not present

## 2018-06-19 DIAGNOSIS — M791 Myalgia, unspecified site: Secondary | ICD-10-CM | POA: Diagnosis not present

## 2018-06-19 DIAGNOSIS — I824Y1 Acute embolism and thrombosis of unspecified deep veins of right proximal lower extremity: Secondary | ICD-10-CM | POA: Diagnosis not present

## 2018-06-19 DIAGNOSIS — I872 Venous insufficiency (chronic) (peripheral): Secondary | ICD-10-CM | POA: Diagnosis not present

## 2018-06-19 DIAGNOSIS — I82511 Chronic embolism and thrombosis of right femoral vein: Secondary | ICD-10-CM | POA: Diagnosis not present

## 2018-06-19 DIAGNOSIS — D508 Other iron deficiency anemias: Secondary | ICD-10-CM | POA: Diagnosis not present

## 2018-06-19 DIAGNOSIS — I890 Lymphedema, not elsewhere classified: Secondary | ICD-10-CM | POA: Diagnosis not present

## 2018-06-19 DIAGNOSIS — R5383 Other fatigue: Secondary | ICD-10-CM | POA: Diagnosis not present

## 2018-06-19 DIAGNOSIS — T7849XA Other allergy, initial encounter: Secondary | ICD-10-CM | POA: Diagnosis not present

## 2018-06-19 DIAGNOSIS — M79674 Pain in right toe(s): Secondary | ICD-10-CM | POA: Diagnosis not present

## 2018-06-19 DIAGNOSIS — M79675 Pain in left toe(s): Secondary | ICD-10-CM | POA: Diagnosis not present

## 2018-06-19 NOTE — Progress Notes (Signed)
[facility] day:  06/19/18  Chief Complaint: Nicole Bishop is a 84 y.o. female with stage III chronic kidney disease and iron deficiency who is seen for 6 month assessment.  HPI: The patient was last seen in the medical oncology clinic on 05/12/2018.  At that time, she remained fatigued. She had signs of progressive debility.  She had BILATERAL ankle edema.  Exam was otherwise unremarkable. Hemoglobin was 10.3, hematocrit 31.3, MCV 85.5, and platelets 268,000. Ferritin was 132.  She was scheduled to see nephrology on 05/14/2018. Notes not available for review.   She was seen by Dr. Gaylyn Cheers on 05/26/2018.  She was not felt able to complete a bowel prep due to her physical condition.  Labs on 06/09/2018 included a hematocrit of 32.5, hemoglobin 10.0, MCV 88.3, platelets 196,000, WBC 4000.  Creatinine was 0.94.  Labs on 06/11/2018 included a ferritin 77, iron saturation 6% and TIBC 274.  Sed rate was 54 (0-30).  Retic was 1.3%.  Patient was seen in the ED on 06/08/2018 by Emmit Alexanders, PA.  Notes reviewed.  Patient seen for complaints of bilateral knee pain after tripping over her dog.  Patient was reported to be hallucinating at home where she saw a "scary chicken on top of her refrigerator".  VH since 02/2018.  Patient evaluated by psychiatry and deemed to be secondary to underlying dementia and OSAH.  Patient also had increased pain in her RIGHT lower extremity x1 week.  Plain films of the knees negative for acute bony abnormality.  Venous ultrasound of the right lower extremity showed an occlusive thrombus in the proximal and mid right femoral vein with nonocclusive thrombus in the distal femoral vein and right popliteal vein.  Patient was admitted for further evaluation and treatment.  Patient was admitted to [facility] from 06/09/2018-06/12/2018.  Notes from hospital course reviewed.  It was discovered the patient was intermittently taking her  apixaban citing the fact that it made her "feel bad".  Patient had been off medication for about a month.  She was treated with intravenous heparin during her admission.  Patient was seen by vascular Delana Meyer, MD) and the recommendation was made to switch to rivaroxaban daily to promote compliance.  Patient received Venofer 200 mg IV x1 dose.  Patient was discharged [facility] on 06/12/2018.  During the interim, patient is doing well overall.  Patient is recovering well and participating in physical therapy services at her SNF.  She notes continued vertiginous symptoms with activity.  Patient's son notes that the patient naps a lot during the day, and experiences visual hallucinations when she wakes up.  Patient notes that her energy is "okay".  RIGHT lower extremity remains swollen and feels "taut" to the patient.  She denies any significant claudication pain.  No increased shortness of breath or episodes of chest pain. Patient denies that she has experienced any B symptoms. She denies any interval infections. Patient denies bleeding; no hematochezia, melena, or gross hematuria.   Patient notes that her sister has had a recent clot (age 17s).   Patient advises that she maintains an adequate appetite. She is eating well. Weight today is 268 lb 4 oz (121.7 kg), which compared to her last visit to the clinic, represents a 2 pound increase.  Patient denies pain in the clinic today.   Past Medical History:  Diagnosis Date  . Anemia   . Anxiety   . Arthritis   . Chronic kidney disease   .  Coronary artery disease   . Depression   . Dizziness   . Dyspnea   . Dysrhythmia    gets tachy if anxious or excited  . GERD (gastroesophageal reflux disease)   . History of hiatal hernia   . HOH (hard of hearing)   . Hypertension   . Hypothyroidism   . IDA (iron deficiency anemia) 12/14/2014  . Myocardial infarction ([place]) 1992  . Renal insufficiency   . Sleep apnea   . Tremors of nervous system      Past Surgical History:  Procedure Laterality Date  . CATARACT EXTRACTION W/PHACO Left 07/15/2017   Procedure: CATARACT EXTRACTION PHACO AND INTRAOCULAR LENS PLACEMENT (IOC)-LEFT;  Surgeon: Birder Robson, MD;  Location: [facility] ORS;  Service: Ophthalmology;  Laterality: Left;  [place] 00.36.1 AP% 13.9 CDE 5.02 Fluid Pack lot # [id number]  . CATARACT EXTRACTION W/PHACO Right 08/26/2017   Procedure: CATARACT EXTRACTION PHACO AND INTRAOCULAR LENS PLACEMENT (IOC);  Surgeon: Birder Robson, MD;  Location: [facility] ORS;  Service: Ophthalmology;  Laterality: Right;  [place] 00:41.0 AP% 15.3 CDE 6.27 FLUID PACK LOT # [id number]  . CHOLECYSTECTOMY    . COLONOSCOPY WITH PROPOFOL N/A 04/10/2015   Procedure: COLONOSCOPY WITH PROPOFOL;  Surgeon: Manya Silvas, MD;  Location: [facility] ENDOSCOPY;  Service: Endoscopy;  Laterality: N/A;  . ESOPHAGOGASTRODUODENOSCOPY (EGD) WITH PROPOFOL N/A 04/10/2015   Procedure: ESOPHAGOGASTRODUODENOSCOPY (EGD) WITH PROPOFOL;  Surgeon: Manya Silvas, MD;  Location: [facility] ENDOSCOPY;  Service: Endoscopy;  Laterality: N/A;  . fatty tumor removed on left shoulder    . gallbladdedr removed    . JOINT REPLACEMENT Bilateral    total knee replacements  . REPLACEMENT TOTAL KNEE BILATERAL Bilateral     Family History  Problem Relation Age of Onset  . Arthritis Mother   . Hypertension Mother   . Stroke Mother   . Hypertension Father   . Heart attack Father   . Breast cancer Neg Hx     Social History:  reports that she has never smoked. She has never used smokeless tobacco. She reports that she does not drink alcohol or use drugs. The patient lives in [place]. Her daughter, Nicole Bishop: phone [phone] and cell [phone]. She is accompanied by her "baby boy" Nicole Bishop today.   Allergies:  Allergies  Allergen Reactions  . Penicillins Rash and Other (See Comments)    Has patient had a PCN reaction causing immediate rash, facial/tongue/throat swelling, SOB or lightheadedness with  hypotension: Unknown Has patient had a PCN reaction causing severe rash involving mucus membranes or skin necrosis: Unknown Has patient had a PCN reaction that required hospitalization: Unknown Has patient had a PCN reaction occurring within the last 10 years: Unknown If all of the above answers are "NO", then may proceed with Cephalosporin use.   . Nsaids Other (See Comments)    GI upset  . Atorvastatin Rash  . Celebrex [Celecoxib] Other (See Comments)    Constipation and stomach upset  . Felodipine Nausea Only  . Oxaprozin Nausea Only  . Rofecoxib Other (See Comments)    GI upset  . Simvastatin Rash    Current Medications: Current Outpatient Medications  Medication Sig Dispense Refill  . acetaminophen (TYLENOL) 650 MG CR tablet Take 650 mg by mouth every 8 (eight) hours as needed for pain.    Marland Kitchen amLODipine (NORVASC) 5 MG tablet Take 5 mg by mouth daily.    . calcitRIOL (ROCALTROL) 0.25 MCG capsule Take 1 capsule by mouth daily.    . Cholecalciferol (VITAMIN  D3) 2000 units capsule Take 4,000 Units by mouth daily.    . CYANOCOBALAMIN PO Take 1,000 mcg by mouth daily.    Marland Kitchen docusate sodium (COLACE) 100 MG capsule Take 100 mg by mouth 2 (two) times daily.     . Ferrous Sulfate (SLOW FE) 142 (45 Fe) MG TBCR Take 1 tablet by mouth daily.    Marland Kitchen loratadine (CLARITIN) 10 MG tablet Take 1 tablet (10 mg total) by mouth daily. 30 tablet 0  . metoprolol succinate (TOPROL-XL) 50 MG 24 hr tablet Take 1 tablet (50 mg total) by mouth daily. Take with or immediately following a meal. 30 tablet 0  . [START ON 06/30/2018] rivaroxaban (XARELTO) 20 MG TABS tablet Take 1 tablet (20 mg total) by mouth daily with supper. 30 tablet 0  . esomeprazole (NEXIUM) 40 MG capsule Take 40 mg by mouth daily as needed (acid reflux).     Marland Kitchen lactulose (CONSTULOSE) 10 GM/15ML solution Take 10 g by mouth daily as needed for mild constipation.     . mometasone (NASONEX) 50 MCG/ACT nasal spray Place 2 sprays into the nose daily  as needed (allergies).     Marland Kitchen olopatadine (PATANOL) 0.1 % ophthalmic solution Place 1 drop into both eyes 2 (two) times daily as needed for allergies.     . polyethylene glycol (MIRALAX / GLYCOLAX) packet Take 17 g by mouth daily as needed for mild constipation or moderate constipation.    . Rivaroxaban (XARELTO) 15 MG TABS tablet Take 1 tablet (15 mg total) by mouth 2 (two) times daily with a meal for 17 days. Through 06/29/2018 then 15 mg daily (Patient not taking: Reported on 06/19/2018) 42 tablet 0   No current facility-administered medications for this visit.    Facility-Administered Medications Ordered in Other Visits  Medication Dose Route Frequency Provider Last Rate Last Dose  . 0.9 %  sodium chloride infusion   Intravenous Continuous Lequita Asal, MD   Stopped at 02/06/18 1429    Review of Systems  Constitutional: Negative for diaphoresis, fever, malaise/fatigue and weight loss (up 2 pounds).  HENT: Negative.   Eyes: Negative.   Respiratory: Negative for cough, hemoptysis, sputum production and shortness of breath.   Cardiovascular: Positive for leg swelling (RIGHT). Negative for chest pain, palpitations, orthopnea and PND.       PMH (+) for atrial fibrillation.  Known infrarenal aneurysm.  Gastrointestinal: Negative for abdominal pain, blood in stool, constipation, diarrhea, melena, nausea and vomiting.  Genitourinary: Negative for dysuria, frequency, hematuria and urgency.       CKD-followed by nephrology  Musculoskeletal: Negative for back pain, falls, joint pain and myalgias.  Skin: Negative for itching and rash.  Neurological: Positive for dizziness and weakness (generalized). Negative for tremors and headaches.  Endo/Heme/Allergies: Does not bruise/bleed easily.  Psychiatric/Behavioral: Positive for hallucinations (visual). Negative for depression, memory loss and suicidal ideas. The patient is not nervous/anxious and does not have insomnia.        Dementia  All other  systems reviewed and are negative.  Performance status (ECOG): 2 - Symptomatic, <50% confined to bed  Vital Signs: BP (!) 152/83 (BP Location: Left Arm, Patient Position: Sitting) Comment: Patient ate sausage this morning.   Pulse 71   Temp (!) 96.5 F (35.8 C) (Tympanic)   Resp 18   Wt 268 lb 4 oz (121.7 kg)   BMI 35.39 kg/m   Physical Exam  Constitutional: She is oriented to person, place, and time and well-developed, well-nourished, and in  no distress.  HENT:  Head: Normocephalic and atraumatic.  Mouth/Throat: Oropharynx is clear and moist and mucous membranes are normal.  Eyes: Pupils are equal, round, and reactive to light. EOM are normal. No scleral icterus.  BILATERAL arcus senilis  Neck: Normal range of motion. Neck supple. No tracheal deviation present. No thyromegaly present.  Cardiovascular: Normal rate, regular rhythm, normal heart sounds and intact distal pulses. Exam reveals no gallop and no friction rub.  No murmur heard. Pulmonary/Chest: Effort normal and breath sounds normal. No respiratory distress. She has no wheezes. She has no rales.  Abdominal: Soft. Bowel sounds are normal. She exhibits no distension. There is no tenderness.  Musculoskeletal: Normal range of motion. She exhibits edema.       Right ankle: She exhibits swelling.       Left ankle: She exhibits swelling.       Right lower leg: She exhibits tenderness and swelling.  Neurological: She is alert and oriented to person, place, and time.  Skin: Skin is warm and dry. No rash noted. No erythema.  Psychiatric: Mood, affect and judgment normal.  Nursing note and vitals reviewed.    No visits with results within 3 Day(s) from this visit.  Latest known visit with results is:  Admission on 06/08/2018, Discharged on 06/12/2018  Component Date Value Ref Range Status  . Color, Urine 06/08/2018 YELLOW* YELLOW Final  . APPearance 06/08/2018 HAZY* CLEAR Final  . Specific Gravity, Urine 06/08/2018 1.012   1.005 - 1.030 Final  . pH 06/08/2018 5.0  5.0 - 8.0 Final  . Glucose, UA 06/08/2018 NEGATIVE  NEGATIVE mg/dL Final  . Hgb urine dipstick 06/08/2018 SMALL* NEGATIVE Final  . Bilirubin Urine 06/08/2018 NEGATIVE  NEGATIVE Final  . Ketones, ur 06/08/2018 NEGATIVE  NEGATIVE mg/dL Final  . Protein, ur 06/08/2018 NEGATIVE  NEGATIVE mg/dL Final  . Nitrite 06/08/2018 NEGATIVE  NEGATIVE Final  . Leukocytes, UA 06/08/2018 SMALL* NEGATIVE Final  . RBC / HPF 06/08/2018 0-5  0 - 5 RBC/hpf Final  . WBC, UA 06/08/2018 0-5  0 - 5 WBC/hpf Final  . Bacteria, UA 06/08/2018 NONE SEEN  NONE SEEN Final  . Squamous Epithelial / LPF 06/08/2018 6-10  0 - 5 Final  . Mucus 06/08/2018 PRESENT   Final  . Hyaline Casts, UA 06/08/2018 PRESENT   Final   Performed at [facility], [address] [place], [place] [address]  . Sodium 06/08/2018 140  135 - 145 mmol/L Final  . Potassium 06/08/2018 4.2  3.5 - 5.1 mmol/L Final  . Chloride 06/08/2018 110  98 - 111 mmol/L Final  . CO2 06/08/2018 25  22 - 32 mmol/L Final  . Glucose, Bld 06/08/2018 100* 70 - 99 mg/dL Final  . BUN 06/08/2018 23  8 - 23 mg/dL Final  . Creatinine, Ser 06/08/2018 1.09* 0.44 - 1.00 mg/dL Final  . Calcium 06/08/2018 9.4  8.9 - 10.3 mg/dL Final  . GFR calc non Af Amer 06/08/2018 45* >60 mL/min Final  . GFR calc Af Amer 06/08/2018 52* >60 mL/min Final   Comment: (NOTE) The eGFR has been calculated using the CKD EPI equation. This calculation has not been validated in all clinical situations. eGFR's persistently <60 mL/min signify possible Chronic Kidney Disease.   Georgiann Hahn gap 06/08/2018 5  5 - 15 Final   Performed at [facility], [address] [place], [place] [address]  . WBC 06/08/2018 4.6  4.0 - 10.5 K/uL Final  . RBC 06/08/2018 3.90  3.87 - 5.11 MIL/uL Final  . Hemoglobin 06/08/2018 10.6* 12.0 - 15.0 g/dL Final  . HCT 06/08/2018 34.3* 36.0 - 46.0 % Final  . MCV 06/08/2018 87.9  80.0 - 100.0 fL Final  . MCH 06/08/2018  27.2  26.0 - 34.0 pg Final  . MCHC 06/08/2018 30.9  30.0 - 36.0 g/dL Final  . RDW 06/08/2018 14.8  11.5 - 15.5 % Final  . Platelets 06/08/2018 213  150 - 400 K/uL Final  . nRBC 06/08/2018 0.0  0.0 - 0.2 % Final   Performed at [facility], [address], [place], [place] [address]  . Prothrombin Time 06/09/2018 15.1  11.4 - 15.2 seconds Final  . INR 06/09/2018 1.20   Final   Performed at [facility], [address] [place], [place] [address]  . aPTT 06/09/2018 30  24 - 36 seconds Final   Performed at [facility], [address] [place], [place] [address]  . Heparin Unfractionated 06/09/2018 1.47* 0.30 - 0.70 IU/mL Final   Comment: (NOTE) If heparin results are below expected values, and patient dosage has  been confirmed, suggest follow up testing of antithrombin III levels. Performed at [facility], [address], [place], [place] [address]   . Sodium 06/09/2018 142  135 - 145 mmol/L Final  . Potassium 06/09/2018 3.7  3.5 - 5.1 mmol/L Final  . Chloride 06/09/2018 112* 98 - 111 mmol/L Final  . CO2 06/09/2018 24  22 - 32 mmol/L Final  . Glucose, Bld 06/09/2018 94  70 - 99 mg/dL Final  . BUN 06/09/2018 18  8 - 23 mg/dL Final  . Creatinine, Ser 06/09/2018 0.94  0.44 - 1.00 mg/dL Final  . Calcium 06/09/2018 9.1  8.9 - 10.3 mg/dL Final  . GFR calc non Af Amer 06/09/2018 54* >60 mL/min Final  . GFR calc Af Amer 06/09/2018 >60  >60 mL/min Final   Comment: (NOTE) The eGFR has been calculated using the CKD EPI equation. This calculation has not been validated in all clinical situations. eGFR's persistently <60 mL/min signify possible Chronic Kidney Disease.   Georgiann Hahn gap 06/09/2018 6  5 - 15 Final   Performed at [facility], [address] [place], [place] [address]  . WBC 06/09/2018 4.0  4.0 - 10.5 K/uL Final  . RBC 06/09/2018 3.68* 3.87 - 5.11 MIL/uL Final  . Hemoglobin 06/09/2018 10.0* 12.0 - 15.0 g/dL Final  . HCT 06/09/2018  32.5* 36.0 - 46.0 % Final  . MCV 06/09/2018 88.3  80.0 - 100.0 fL Final  . MCH 06/09/2018 27.2  26.0 - 34.0 pg Final  . MCHC 06/09/2018 30.8  30.0 - 36.0 g/dL Final  . RDW 06/09/2018 14.8  11.5 - 15.5 % Final  . Platelets 06/09/2018 196  150 - 400 K/uL Final  . nRBC 06/09/2018 0.0  0.0 - 0.2 % Final   Performed at [facility], [address] [place], [place] [address]  . aPTT 06/09/2018 82* 24 - 36 seconds Final   Comment:        IF BASELINE aPTT IS ELEVATED, SUGGEST PATIENT RISK ASSESSMENT BE USED TO DETERMINE APPROPRIATE ANTICOAGULANT THERAPY. Performed at [facility], [address], [place],  [address]   . Retic Ct Pct 06/11/2018 1.3  0.4 - 3.1 % Final  . RBC. 06/11/2018 3.75* 3.87 - 5.11 MIL/uL Final  . Retic Count, Absolute 06/11/2018 49.9  19.0 - 186.0 K/uL Final  . Immature Retic Fract 06/11/2018  11.9  2.3 - 15.9 % Final   Performed at [facility], [address] [place], [place] [address]  . Ferritin 06/11/2018 77  11 - 307 ng/mL Final   Performed at [facility] [address] [place], [place] [address]  . Iron 06/11/2018 16* 28 - 170 ug/dL Final  . TIBC 06/11/2018 274  250 - 450 ug/dL Final  . Saturation Ratios 06/11/2018 6* 10.4 - 31.8 % Final  . UIBC 06/11/2018 258  ug/dL Final   Performed at [facility], [address] [place], [place] [address]  . Sed Rate 06/11/2018 54* 0 - 30 mm/hr Final   Performed at [facility], [address] [place], [place] [address]    Assessment:  Nicole Bishop is a 84 y.o. African American woman with stage III chronic kidney disease and iron deficiency.  Hematocrit and MCV were normal on 09/03/2013.    She denies any GI bleeding.  Diet is modest.  EGD on 04/10/2015 revealed a large hiatal hernia and gastritis.  Biopsy was unremarkable.  Colonoscopy on 04/10/2015 revealed diverticulosis in the sigmoid, descending, transverse, and ascending colon.  She had  internal hemorrhoids.  She is B12 deficient.  She received monthly injections at the [facility] (last 07/2016).  She began oral B12 in 08/2016.  B12 and folate were normal on 02/03/2018.  She is intolerant of oral iron secondary to baseline chronic constipation.   Labs on 09/12/2014 revealed a hematocrit of 30 with an MCV of 67. Ferritin was 6 (low) with a TIBC of 487 (high).  Normal studies included direct [place], folic acid, H88, haptoglobin, erythropoietin, and SPEP.    She has received weekly Venofer:  800 mg (10/21/2014 - 11/11/2014), 400 mg (03/03/2015 and 03/10/2015), 400 mg (10/20/2015 and 10/27/2015), 200 mg (04/16/2016), 600 mg (11/11/2016 - 11/25/2016), 600 mg (05/23/2017 - 06/06/2017), 400 mg (02/06/2018 - 02/13/2018), and 200 mg (06/11/2018).  She receives Venofer if her ferritin is < 30.    Ferritin has been followed:  14 on 12/28/2014, 9 on 02/23/2015, 113 on 03/17/2015, 12 on 10/13/2015, 31 on 01/10/2016, 19 on 04/11/2016, 23 on 07/18/2016, 9 on 11/11/2016, 9 on 05/13/2017, 46 on 08/07/2017, 41 on 11/10/2017, 32 on 02/03/2018, 132 on 05/11/2018, and 77 on 06/11/2018.  Goal ferritin is 100.  Admitted to [facility] from 06/09/2018-06/12/2018 for DVT. It was discovered the patient was intermittently taking her apixaban citing the fact that it made her "feel bad".  Patient had been off medication for about a month.  She was treated with intravenous heparin during her admission.  Patient was seen by vascular Delana Meyer, MD) and the recommendation was made to switch to rivaroxaban daily to promote compliance.  Patient received Venofer 200 mg IV x1 dose.  Patient was discharged [facility] on 06/12/2018.  Symptomatically, patient notes that her energy is "okay".  Patient is participating in physical therapy at SNF.  She notes generalized weakness.  Patient experiencing intermittent VH secondary to her dementia diagnosis.  Exam reveals bilateral ankle edema, and tender edema and the right lower  extremity.  Exam otherwise unremarkable.  Plan: 1. Review labs from 06/09/2018 and 06/11/2018. 2. Review interval admission to [facility]. 3. RIGHT lower extremity DVT  Successfully anticoagulated with heparin during recent admission to the hospital.  Transitioned to rivaroxaban prior to discharge to promote compliance.  Some swelling remains, however denies claudication pain.  No shortness of breath. 4. Recurrent iron deficiency anemia  Reviewed labs from inpatient admission  Hemoglobin 10.0, hematocrit 32.5, MCV 88.3, and platelets 196,000  Reticulocytes 1.3%  Iron saturation 6% with a TIBC of 274 ug/dL.  Ferritin was 77 ng/mL.  ESR elevated at 54 mm/hr  Ferritin felt to be falsely elevated due to elevated sedimentation rate.  Patient received Venofer 200 mg IV on 06/11/2018.  Discuss need for follow-up with gastroenterology Vira Agar, MD) for consideration of possible VCE study. 5. Stage III chronic kidney disease  BUN 18 and creatinine 0.94 mg/dL.  Discussed potential need for additional support (Procrit) if hemoglobin drifts below 10 following IV iron replacement.  Followed by nephrology. 6. B12 deficiency  B12 and folate levels normal when checked in 01/2018.  Continue daily oral B12 1000 mcg supplementation. 7. Infrarenal aneurysm  Noted on abdominal and pelvic CT on 01/19/2017.  Follow-up ultrasound recommended in 1 year (overdue).  Will defer ordering to PCP. 8. RTC in 2 weeks for labs (CBC with differential, ferritin, ESR). 9. RTC in 1 month for MD assessment, labs (CBC with differential, ferritin-day before), and +/- Venofer.    Honor Loh, NP  06/19/2018, 10:33 AM   I saw and evaluated the patient, participating in the key portions of the service and reviewing pertinent diagnostic studies and records.  I reviewed the nurse practitioner's note and agree with the findings and the plan.  The assessment and plan were discussed with the patient.  A few questions were  asked by the patient and answered.   Lequita Asal, MD  06/19/2018, 10:33 AM

## 2018-06-19 NOTE — Progress Notes (Signed)
Patient recently hospitalized for DVT in right leg.  Bilateral lower extremity edema.  Patient is seeing PCP on Monday.  Patient is currently @ [facility].  Doing PT there.  Patient not sleeping well.  Patient is accompanied by her son [name] today.

## 2018-06-22 ENCOUNTER — Ambulatory Visit (INDEPENDENT_AMBULATORY_CARE_PROVIDER_SITE_OTHER): Payer: Medicare HMO | Admitting: Vascular Surgery

## 2018-06-22 ENCOUNTER — Encounter (INDEPENDENT_AMBULATORY_CARE_PROVIDER_SITE_OTHER): Payer: Self-pay | Admitting: Vascular Surgery

## 2018-06-22 VITALS — BP 160/85 | HR 69 | Resp 16 | Ht 73.0 in | Wt 273.0 lb

## 2018-06-22 DIAGNOSIS — E669 Obesity, unspecified: Secondary | ICD-10-CM | POA: Diagnosis not present

## 2018-06-22 DIAGNOSIS — Z6836 Body mass index (BMI) 36.0-36.9, adult: Secondary | ICD-10-CM | POA: Diagnosis not present

## 2018-06-22 DIAGNOSIS — K219 Gastro-esophageal reflux disease without esophagitis: Secondary | ICD-10-CM

## 2018-06-22 DIAGNOSIS — I890 Lymphedema, not elsewhere classified: Secondary | ICD-10-CM | POA: Diagnosis not present

## 2018-06-22 DIAGNOSIS — I824Y1 Acute embolism and thrombosis of unspecified deep veins of right proximal lower extremity: Secondary | ICD-10-CM | POA: Diagnosis not present

## 2018-06-22 DIAGNOSIS — M79675 Pain in left toe(s): Secondary | ICD-10-CM | POA: Diagnosis not present

## 2018-06-22 DIAGNOSIS — I82511 Chronic embolism and thrombosis of right femoral vein: Secondary | ICD-10-CM | POA: Diagnosis not present

## 2018-06-22 DIAGNOSIS — I4891 Unspecified atrial fibrillation: Secondary | ICD-10-CM | POA: Diagnosis not present

## 2018-06-22 DIAGNOSIS — B351 Tinea unguium: Secondary | ICD-10-CM | POA: Diagnosis not present

## 2018-06-22 DIAGNOSIS — I872 Venous insufficiency (chronic) (peripheral): Secondary | ICD-10-CM

## 2018-06-22 DIAGNOSIS — N183 Chronic kidney disease, stage 3 (moderate): Secondary | ICD-10-CM | POA: Diagnosis not present

## 2018-06-22 DIAGNOSIS — R5383 Other fatigue: Secondary | ICD-10-CM | POA: Diagnosis not present

## 2018-06-22 DIAGNOSIS — I10 Essential (primary) hypertension: Secondary | ICD-10-CM | POA: Diagnosis not present

## 2018-06-22 DIAGNOSIS — R0683 Snoring: Secondary | ICD-10-CM | POA: Diagnosis not present

## 2018-06-22 DIAGNOSIS — M79674 Pain in right toe(s): Secondary | ICD-10-CM | POA: Diagnosis not present

## 2018-06-22 NOTE — Progress Notes (Signed)
MRN : [id number]  Nicole Bishop is a 84 y.o. (1931-04-24) female who presents with chief complaint of No chief complaint on file. Marland Kitchen  History of Present Illness:   The patient presents to the office for evaluation of DVT.  DVT was identified at [facility] by Duplex ultrasound.  The initial symptoms were pain and swelling in the lower extremity.  The patient notes the leg continues to be very painful with dependency and swells quite a bite.  Symptoms are much better with elevation.  The patient notes minimal edema in the morning which steadily worsens throughout the day.    The patient has not been using compression therapy at this point.  No SOB or pleuritic chest pains.  No cough or hemoptysis.  No blood per rectum or blood in any sputum.  No excessive bruising per the patient.   No outpatient medications have been marked as taking for the 06/22/18 encounter (Office Visit) with Delana Meyer, Dolores Lory, MD.    Past Medical History:  Diagnosis Date  . Anemia   . Anxiety   . Arthritis   . Chronic kidney disease   . Coronary artery disease   . Depression   . Dizziness   . Dyspnea   . Dysrhythmia    gets tachy if anxious or excited  . GERD (gastroesophageal reflux disease)   . History of hiatal hernia   . HOH (hard of hearing)   . Hypertension   . Hypothyroidism   . IDA (iron deficiency anemia) 12/14/2014  . Myocardial infarction ([place]) 1992  . Renal insufficiency   . Sleep apnea   . Tremors of nervous system     Past Surgical History:  Procedure Laterality Date  . CATARACT EXTRACTION W/PHACO Left 07/15/2017   Procedure: CATARACT EXTRACTION PHACO AND INTRAOCULAR LENS PLACEMENT (IOC)-LEFT;  Surgeon: Birder Robson, MD;  Location: [facility] ORS;  Service: Ophthalmology;  Laterality: Left;  [place] 00.36.1 AP% 13.9 CDE 5.02 Fluid Pack lot # [id number]  . CATARACT EXTRACTION W/PHACO Right 08/26/2017   Procedure: CATARACT EXTRACTION PHACO AND INTRAOCULAR LENS PLACEMENT (IOC);  Surgeon:  Birder Robson, MD;  Location: [facility] ORS;  Service: Ophthalmology;  Laterality: Right;  [place] 00:41.0 AP% 15.3 CDE 6.27 FLUID PACK LOT # [id number]  . CHOLECYSTECTOMY    . COLONOSCOPY WITH PROPOFOL N/A 04/10/2015   Procedure: COLONOSCOPY WITH PROPOFOL;  Surgeon: Manya Silvas, MD;  Location: [facility] ENDOSCOPY;  Service: Endoscopy;  Laterality: N/A;  . ESOPHAGOGASTRODUODENOSCOPY (EGD) WITH PROPOFOL N/A 04/10/2015   Procedure: ESOPHAGOGASTRODUODENOSCOPY (EGD) WITH PROPOFOL;  Surgeon: Manya Silvas, MD;  Location: [facility] ENDOSCOPY;  Service: Endoscopy;  Laterality: N/A;  . fatty tumor removed on left shoulder    . gallbladdedr removed    . JOINT REPLACEMENT Bilateral    total knee replacements  . REPLACEMENT TOTAL KNEE BILATERAL Bilateral     Social History Social History   Tobacco Use  . Smoking status: Never Smoker  . Smokeless tobacco: Never Used  Substance Use Topics  . Alcohol use: No  . Drug use: No    Family History Family History  Problem Relation Age of Onset  . Arthritis Mother   . Hypertension Mother   . Stroke Mother   . Hypertension Father   . Heart attack Father   . Breast cancer Neg Hx     Allergies  Allergen Reactions  . Penicillins Rash and Other (See Comments)    Has patient had a PCN reaction causing immediate rash, facial/tongue/throat swelling, SOB or  lightheadedness with hypotension: Unknown Has patient had a PCN reaction causing severe rash involving mucus membranes or skin necrosis: Unknown Has patient had a PCN reaction that required hospitalization: Unknown Has patient had a PCN reaction occurring within the last 10 years: Unknown If all of the above answers are "NO", then may proceed with Cephalosporin use.   . Nsaids Other (See Comments)    GI upset  . Atorvastatin Rash  . Celebrex [Celecoxib] Other (See Comments)    Constipation and stomach upset  . Felodipine Nausea Only  . Oxaprozin Nausea Only  . Rofecoxib Other (See Comments)    GI  upset  . Simvastatin Rash     REVIEW OF SYSTEMS (Negative unless checked)  Constitutional: [] Weight loss  [] Fever  [] Chills Cardiac: [] Chest pain   [] Chest pressure   [] Palpitations   [] Shortness of breath when laying flat   [] Shortness of breath with exertion. Vascular:  [] Pain in legs with walking   [] Pain in legs at rest  [x] History of DVT   [] Phlebitis   [x] Swelling in legs   [] Varicose veins   [] Non-healing ulcers Pulmonary:   [] Uses home oxygen   [] Productive cough   [] Hemoptysis   [] Wheeze  [] COPD   [] Asthma Neurologic:  [] Dizziness   [] Seizures   [] History of stroke   [] History of TIA  [] Aphasia   [] Vissual changes   [] Weakness or numbness in arm   [] Weakness or numbness in leg Musculoskeletal:   [] Joint swelling   [x] Joint pain   [x] Low back pain Hematologic:  [] Easy bruising  [] Easy bleeding   [] Hypercoagulable state   [] Anemic Gastrointestinal:  [] Diarrhea   [] Vomiting  [] Gastroesophageal reflux/heartburn   [] Difficulty swallowing. Genitourinary:  [] Chronic kidney disease   [] Difficult urination  [] Frequent urination   [] Blood in urine Skin:  [x] Rashes   [] Ulcers  Psychological:  [] History of anxiety   []  History of major depression.  Physical Examination  There were no vitals filed for this visit. There is no height or weight on file to calculate BMI. Gen: WD/WN, NAD Head: /AT, No temporalis wasting.  Ear/Nose/Throat: Hearing grossly intact, nares w/o erythema or drainage Eyes: PER, EOMI, sclera nonicteric.  Neck: Supple, no large masses.   Pulmonary:  Good air movement, no audible wheezing bilaterally, no use of accessory muscles.  Cardiac: RRR, no JVD Vascular: scattered varicosities present bilaterally.  Mild venous stasis changes to the legs bilaterally.  3+ soft pitting edema Vessel Right Left  Radial Palpable Palpable  PT Palpable Palpable  DP Palpable Palpable  Gastrointestinal: Non-distended. No guarding/no peritoneal signs.  Musculoskeletal: M/S 5/5  throughout.  No deformity or atrophy.  Neurologic: CN 2-12 intact. Symmetrical.  Speech is fluent. Motor exam as listed above. Psychiatric: Judgment intact, Mood & affect appropriate for pt's clinical situation. Dermatologic: Venous rashes no ulcers noted.  No changes consistent with cellulitis. Lymph : No lichenification or skin changes of chronic lymphedema.  CBC Lab Results  Component Value Date   WBC 4.0 06/09/2018   HGB 10.0 (L) 06/09/2018   HCT 32.5 (L) 06/09/2018   MCV 88.3 06/09/2018   PLT 196 06/09/2018    BMET    Component Value Date/Time   NA 142 06/09/2018 0616   NA 135 11/21/2014 1301   K 3.7 06/09/2018 0616   K 4.1 11/21/2014 1301   CL 112 (H) 06/09/2018 0616   CL 105 11/21/2014 1301   CO2 24 06/09/2018 0616   CO2 23 11/21/2014 1301   GLUCOSE 94 06/09/2018 0616   GLUCOSE  102 (H) 11/21/2014 1301   BUN 18 06/09/2018 0616   BUN 15 11/21/2014 1301   CREATININE 0.94 06/09/2018 0616   CREATININE 1.08 (H) 11/21/2014 1301   CALCIUM 9.1 06/09/2018 0616   CALCIUM 9.0 11/21/2014 1301   GFRNONAA 54 (L) 06/09/2018 0616   GFRNONAA 48 (L) 11/21/2014 1301   GFRAA >60 06/09/2018 0616   GFRAA 56 (L) 11/21/2014 1301   Estimated Creatinine Clearance: 64.9 mL/min (by C-G formula based on SCr of 0.94 mg/dL).  COAG Lab Results  Component Value Date   INR 1.20 06/09/2018   INR 1.1 11/21/2014    Radiology Dg Shoulder Right  Result Date: 06/10/2018 CLINICAL DATA:  Shoulder pain after fall EXAM: RIGHT SHOULDER - 2+ VIEW COMPARISON:  Chest x-ray 10/16/2017 FINDINGS: Moderate AC joint degenerative change. No acute displaced fracture or malalignment. Advanced arthritis at the glenohumeral interval with narrowing, subchondral sclerosis and cyst formation as well as prominent inferior osteophytes. IMPRESSION: 1. No acute osseous abnormality 2. Advanced arthritis of the right shoulder Electronically Signed   By: Donavan Foil M.D.   On: 06/10/2018 16:01   Dg Ankle Complete  Right  Result Date: 06/08/2018 CLINICAL DATA:  Ankle pain after fall EXAM: RIGHT ANKLE - COMPLETE 3+ VIEW COMPARISON:  None. FINDINGS: Diffuse soft tissue swelling. No fracture or malalignment. Small plantar calcaneal spur. Ankle mortise is symmetric. IMPRESSION: No acute osseous abnormality. Electronically Signed   By: Donavan Foil M.D.   On: 06/08/2018 21:37   US Venous Img Lower Unilateral Right  Result Date: 06/08/2018 CLINICAL DATA:  Right leg swelling for 1 day EXAM: RIGHT LOWER EXTREMITY VENOUS DOPPLER ULTRASOUND TECHNIQUE: Gray-scale sonography with graded compression, as well as color Doppler and duplex ultrasound were performed to evaluate the lower extremity deep venous systems from the level of the common femoral vein and including the common femoral, femoral, profunda femoral, popliteal and calf veins including the posterior tibial, peroneal and gastrocnemius veins when visible. The superficial great saphenous vein was also interrogated. Spectral Doppler was utilized to evaluate flow at rest and with distal augmentation maneuvers in the common femoral, femoral and popliteal veins. COMPARISON:  11/30/2014 FINDINGS: Contralateral Common Femoral Vein: Respiratory phasicity is normal and symmetric with the symptomatic side. No evidence of thrombus. Normal compressibility. Common Femoral Vein: No evidence of thrombus. Normal compressibility, respiratory phasicity and response to augmentation. Saphenofemoral Junction: No evidence of thrombus. Normal compressibility and flow on color Doppler imaging. Profunda Femoral Vein: No evidence of thrombus. Normal compressibility and flow on color Doppler imaging. Femoral Vein: There is thrombus noted within the right femoral vein, occlusive in the proximal and mid femoral vein and nonocclusive in the distal femoral vein. Popliteal Vein: Nonocclusive thrombus noted in the popliteal vein. Calf Veins: Limited visualization, grossly unremarkable. Superficial Great  Saphenous Vein: No evidence of thrombus. Normal compressibility. Venous Reflux:  None. Other Findings:  None. IMPRESSION: Occlusive thrombus noted within the proximal and mid right femoral vein with nonocclusive thrombus in the distal femoral vein and right popliteal vein. Electronically Signed   By: Rolm Baptise M.D.   On: 06/08/2018 23:48   Dg Knee Complete 4 Views Left  Result Date: 06/08/2018 CLINICAL DATA:  Fall, bilateral knee pain EXAM: LEFT KNEE - COMPLETE 4+ VIEW COMPARISON:  12/17/2008 FINDINGS: Changes of left knee replacement. No hardware complicating feature. No fracture, subluxation or dislocation. IMPRESSION: Prior left knee replacement. No complicating feature or acute bony abnormality. Electronically Signed   By: Rolm Baptise M.D.   On: 06/08/2018  20:14   Dg Knee Complete 4 Views Right  Result Date: 06/08/2018 CLINICAL DATA:  Fall, bilateral knee pain EXAM: RIGHT KNEE - COMPLETE 4+ VIEW COMPARISON:  12/17/2008 FINDINGS: Changes of right knee replacement. No hardware complicating feature. No acute bony abnormality. Specifically, no fracture, subluxation, or dislocation. No joint effusion. IMPRESSION: Right knee replacement. No complicating feature. No acute bony abnormality. Electronically Signed   By: Rolm Baptise M.D.   On: 06/08/2018 20:15    Assessment/Plan 1. Chronic deep vein thrombosis (DVT) of femoral vein of right lower extremity (HCC) No surgery or intervention at this point in time.    I have had a long discussion with the patient regarding venous insufficiency and why it  causes symptoms. I have discussed with the patient the chronic skin changes that accompany venous insufficiency and the long term sequela such as infection and ulceration.  Patient will begin wearing graduated compression stockings class 1 (20-30 mmHg) or compression wraps on a daily basis a prescription was given. The patient will put the stockings on first thing in the morning and removing them in the  evening. The patient is instructed specifically not to sleep in the stockings.    In addition, behavioral modification including several periods of elevation of the lower extremities during the day will be continued. I have demonstrated that proper elevation is a position with the ankles at heart level.  The patient is instructed to begin routine exercise, especially walking on a daily basis  Patient should undergo duplex ultrasound of the venous system to ensure that DVT or reflux is not present.  Following the review of the ultrasound the patient will follow up in 2-3 months to reassess the degree of swelling and the control that graduated compression stockings or compression wraps  is offering.   The patient can be assessed for a Lymph Pump at that time - VAS [place] LOWER EXTREMITY VENOUS (DVT); Future  2. Chronic venous insufficiency No surgery or intervention at this point in time.    I have had a long discussion with the patient regarding venous insufficiency and why it  causes symptoms. I have discussed with the patient the chronic skin changes that accompany venous insufficiency and the long term sequela such as infection and ulceration.  Patient will begin wearing graduated compression stockings class 1 (20-30 mmHg) or compression wraps on a daily basis a prescription was given. The patient will put the stockings on first thing in the morning and removing them in the evening. The patient is instructed specifically not to sleep in the stockings.    In addition, behavioral modification including several periods of elevation of the lower extremities during the day will be continued. I have demonstrated that proper elevation is a position with the ankles at heart level.  The patient is instructed to begin routine exercise, especially walking on a daily basis  Patient should undergo duplex ultrasound of the venous system to ensure that DVT or reflux is not present.  Following the review of the  ultrasound the patient will follow up in 2-3 months to reassess the degree of swelling and the control that graduated compression stockings or compression wraps  is offering.   The patient can be assessed for a Lymph Pump at that time  3. Lymphedema No surgery or intervention at this point in time.    I have had a long discussion with the patient regarding venous insufficiency and why it  causes symptoms. I have discussed with the patient  the chronic skin changes that accompany venous insufficiency and the long term sequela such as infection and ulceration.  Patient will begin wearing graduated compression stockings class 1 (20-30 mmHg) or compression wraps on a daily basis a prescription was given. The patient will put the stockings on first thing in the morning and removing them in the evening. The patient is instructed specifically not to sleep in the stockings.    In addition, behavioral modification including several periods of elevation of the lower extremities during the day will be continued. I have demonstrated that proper elevation is a position with the ankles at heart level.  The patient is instructed to begin routine exercise, especially walking on a daily basis  Patient should undergo duplex ultrasound of the venous system to ensure that DVT or reflux is not present.  Following the review of the ultrasound the patient will follow up in 2-3 months to reassess the degree of swelling and the control that graduated compression stockings or compression wraps  is offering.   The patient can be assessed for a Lymph Pump at that time  4. Essential (primary) hypertension Continue antihypertensive medications as already ordered, these medications have been reviewed and there are no changes at this time.   5. Gastroesophageal reflux disease without esophagitis Continue PPI as already ordered, this medication has been reviewed and there are no changes at this time.  Avoidence of caffeine and  alcohol  Moderate elevation of the head of the bed    Hortencia Pilar, MD  06/22/2018 2:21 PM

## 2018-06-23 ENCOUNTER — Encounter (INDEPENDENT_AMBULATORY_CARE_PROVIDER_SITE_OTHER): Payer: Self-pay | Admitting: Vascular Surgery

## 2018-06-23 DIAGNOSIS — I890 Lymphedema, not elsewhere classified: Secondary | ICD-10-CM | POA: Insufficient documentation

## 2018-06-23 DIAGNOSIS — I872 Venous insufficiency (chronic) (peripheral): Secondary | ICD-10-CM | POA: Insufficient documentation

## 2018-06-30 DIAGNOSIS — D649 Anemia, unspecified: Secondary | ICD-10-CM | POA: Diagnosis not present

## 2018-06-30 DIAGNOSIS — M791 Myalgia, unspecified site: Secondary | ICD-10-CM | POA: Diagnosis not present

## 2018-06-30 DIAGNOSIS — I10 Essential (primary) hypertension: Secondary | ICD-10-CM | POA: Diagnosis not present

## 2018-06-30 DIAGNOSIS — N189 Chronic kidney disease, unspecified: Secondary | ICD-10-CM | POA: Diagnosis not present

## 2018-06-30 DIAGNOSIS — K5900 Constipation, unspecified: Secondary | ICD-10-CM | POA: Diagnosis not present

## 2018-06-30 DIAGNOSIS — I82401 Acute embolism and thrombosis of unspecified deep veins of right lower extremity: Secondary | ICD-10-CM | POA: Diagnosis not present

## 2018-07-03 ENCOUNTER — Inpatient Hospital Stay: Payer: Medicare HMO

## 2018-07-08 ENCOUNTER — Other Ambulatory Visit: Payer: Self-pay

## 2018-07-08 DIAGNOSIS — I82411 Acute embolism and thrombosis of right femoral vein: Secondary | ICD-10-CM | POA: Diagnosis not present

## 2018-07-08 DIAGNOSIS — N183 Chronic kidney disease, stage 3 (moderate): Secondary | ICD-10-CM | POA: Diagnosis not present

## 2018-07-08 DIAGNOSIS — F329 Major depressive disorder, single episode, unspecified: Secondary | ICD-10-CM | POA: Diagnosis not present

## 2018-07-08 DIAGNOSIS — I4891 Unspecified atrial fibrillation: Secondary | ICD-10-CM | POA: Diagnosis not present

## 2018-07-08 DIAGNOSIS — K5909 Other constipation: Secondary | ICD-10-CM | POA: Diagnosis not present

## 2018-07-08 DIAGNOSIS — I130 Hypertensive heart and chronic kidney disease with heart failure and stage 1 through stage 4 chronic kidney disease, or unspecified chronic kidney disease: Secondary | ICD-10-CM | POA: Diagnosis not present

## 2018-07-08 DIAGNOSIS — I5032 Chronic diastolic (congestive) heart failure: Secondary | ICD-10-CM | POA: Diagnosis not present

## 2018-07-08 DIAGNOSIS — E039 Hypothyroidism, unspecified: Secondary | ICD-10-CM | POA: Diagnosis not present

## 2018-07-08 DIAGNOSIS — F0280 Dementia in other diseases classified elsewhere without behavioral disturbance: Secondary | ICD-10-CM | POA: Diagnosis not present

## 2018-07-08 NOTE — Patient Outreach (Signed)
[facility]) Care Management  07/08/2018  Nicole Bishop 1930-09-30 [id number]     Transition of Care Referral  Referral Date: 07/08/18 Referral Source: [employer] Discharge Report Date of Admission: 06/12/18 Diagnosis:"DVT" Date of Discharge: 07/07/18 Facility: [facility] Insurance: [employer]    Outreach attempt # 1 to patient. Patient voices [facility] staff currently in the home with her and call completed with patient's daughter-Terri. Caregiver states that things have been going well since patient returned home yesterday.She denies any acute needs or concerns. She voices that she went over meds yesterday with someone and does not wish to review them again. Daughter was able to confirm that she has all meds and understands med regimen. Patient has PCP follow up appt on next week-07/14/18. Daughter will be taking patient to appt. [facility] services reviewed and discussed. She denies any further RN CM needs or concerns at this time.     Plan: RN CM will close case at this time.    Enzo Montgomery, RN,BSN,CCM [place] [facility] Telephonic Care Management Coordinator Direct Phone: [phone] Toll Free: [phone] Fax: [phone]

## 2018-07-09 DIAGNOSIS — I82411 Acute embolism and thrombosis of right femoral vein: Secondary | ICD-10-CM | POA: Diagnosis not present

## 2018-07-09 DIAGNOSIS — N183 Chronic kidney disease, stage 3 (moderate): Secondary | ICD-10-CM | POA: Diagnosis not present

## 2018-07-09 DIAGNOSIS — I4891 Unspecified atrial fibrillation: Secondary | ICD-10-CM | POA: Diagnosis not present

## 2018-07-09 DIAGNOSIS — I5032 Chronic diastolic (congestive) heart failure: Secondary | ICD-10-CM | POA: Diagnosis not present

## 2018-07-09 DIAGNOSIS — F0280 Dementia in other diseases classified elsewhere without behavioral disturbance: Secondary | ICD-10-CM | POA: Diagnosis not present

## 2018-07-09 DIAGNOSIS — I130 Hypertensive heart and chronic kidney disease with heart failure and stage 1 through stage 4 chronic kidney disease, or unspecified chronic kidney disease: Secondary | ICD-10-CM | POA: Diagnosis not present

## 2018-07-09 DIAGNOSIS — F329 Major depressive disorder, single episode, unspecified: Secondary | ICD-10-CM | POA: Diagnosis not present

## 2018-07-09 DIAGNOSIS — K5909 Other constipation: Secondary | ICD-10-CM | POA: Diagnosis not present

## 2018-07-09 DIAGNOSIS — E039 Hypothyroidism, unspecified: Secondary | ICD-10-CM | POA: Diagnosis not present

## 2018-07-10 DIAGNOSIS — I130 Hypertensive heart and chronic kidney disease with heart failure and stage 1 through stage 4 chronic kidney disease, or unspecified chronic kidney disease: Secondary | ICD-10-CM | POA: Diagnosis not present

## 2018-07-10 DIAGNOSIS — F0280 Dementia in other diseases classified elsewhere without behavioral disturbance: Secondary | ICD-10-CM | POA: Diagnosis not present

## 2018-07-10 DIAGNOSIS — E039 Hypothyroidism, unspecified: Secondary | ICD-10-CM | POA: Diagnosis not present

## 2018-07-10 DIAGNOSIS — N183 Chronic kidney disease, stage 3 (moderate): Secondary | ICD-10-CM | POA: Diagnosis not present

## 2018-07-10 DIAGNOSIS — K5909 Other constipation: Secondary | ICD-10-CM | POA: Diagnosis not present

## 2018-07-10 DIAGNOSIS — I4891 Unspecified atrial fibrillation: Secondary | ICD-10-CM | POA: Diagnosis not present

## 2018-07-10 DIAGNOSIS — F329 Major depressive disorder, single episode, unspecified: Secondary | ICD-10-CM | POA: Diagnosis not present

## 2018-07-10 DIAGNOSIS — I5032 Chronic diastolic (congestive) heart failure: Secondary | ICD-10-CM | POA: Diagnosis not present

## 2018-07-10 DIAGNOSIS — I82411 Acute embolism and thrombosis of right femoral vein: Secondary | ICD-10-CM | POA: Diagnosis not present

## 2018-07-13 DIAGNOSIS — E039 Hypothyroidism, unspecified: Secondary | ICD-10-CM | POA: Diagnosis not present

## 2018-07-13 DIAGNOSIS — I130 Hypertensive heart and chronic kidney disease with heart failure and stage 1 through stage 4 chronic kidney disease, or unspecified chronic kidney disease: Secondary | ICD-10-CM | POA: Diagnosis not present

## 2018-07-13 DIAGNOSIS — K5909 Other constipation: Secondary | ICD-10-CM | POA: Diagnosis not present

## 2018-07-13 DIAGNOSIS — F0280 Dementia in other diseases classified elsewhere without behavioral disturbance: Secondary | ICD-10-CM | POA: Diagnosis not present

## 2018-07-13 DIAGNOSIS — I5032 Chronic diastolic (congestive) heart failure: Secondary | ICD-10-CM | POA: Diagnosis not present

## 2018-07-13 DIAGNOSIS — I82411 Acute embolism and thrombosis of right femoral vein: Secondary | ICD-10-CM | POA: Diagnosis not present

## 2018-07-13 DIAGNOSIS — F329 Major depressive disorder, single episode, unspecified: Secondary | ICD-10-CM | POA: Diagnosis not present

## 2018-07-13 DIAGNOSIS — I4891 Unspecified atrial fibrillation: Secondary | ICD-10-CM | POA: Diagnosis not present

## 2018-07-13 DIAGNOSIS — N183 Chronic kidney disease, stage 3 (moderate): Secondary | ICD-10-CM | POA: Diagnosis not present

## 2018-07-14 DIAGNOSIS — F0390 Unspecified dementia without behavioral disturbance: Secondary | ICD-10-CM | POA: Diagnosis not present

## 2018-07-14 DIAGNOSIS — R32 Unspecified urinary incontinence: Secondary | ICD-10-CM | POA: Diagnosis not present

## 2018-07-14 DIAGNOSIS — I825Y2 Chronic embolism and thrombosis of unspecified deep veins of left proximal lower extremity: Secondary | ICD-10-CM | POA: Diagnosis not present

## 2018-07-14 DIAGNOSIS — N183 Chronic kidney disease, stage 3 (moderate): Secondary | ICD-10-CM | POA: Diagnosis not present

## 2018-07-14 DIAGNOSIS — I5032 Chronic diastolic (congestive) heart failure: Secondary | ICD-10-CM | POA: Diagnosis not present

## 2018-07-14 DIAGNOSIS — I10 Essential (primary) hypertension: Secondary | ICD-10-CM | POA: Diagnosis not present

## 2018-07-17 DIAGNOSIS — I130 Hypertensive heart and chronic kidney disease with heart failure and stage 1 through stage 4 chronic kidney disease, or unspecified chronic kidney disease: Secondary | ICD-10-CM | POA: Diagnosis not present

## 2018-07-17 DIAGNOSIS — N183 Chronic kidney disease, stage 3 (moderate): Secondary | ICD-10-CM | POA: Diagnosis not present

## 2018-07-17 DIAGNOSIS — F0280 Dementia in other diseases classified elsewhere without behavioral disturbance: Secondary | ICD-10-CM | POA: Diagnosis not present

## 2018-07-17 DIAGNOSIS — I5032 Chronic diastolic (congestive) heart failure: Secondary | ICD-10-CM | POA: Diagnosis not present

## 2018-07-17 DIAGNOSIS — I82411 Acute embolism and thrombosis of right femoral vein: Secondary | ICD-10-CM | POA: Diagnosis not present

## 2018-07-17 DIAGNOSIS — I4891 Unspecified atrial fibrillation: Secondary | ICD-10-CM | POA: Diagnosis not present

## 2018-07-17 DIAGNOSIS — F329 Major depressive disorder, single episode, unspecified: Secondary | ICD-10-CM | POA: Diagnosis not present

## 2018-07-17 DIAGNOSIS — E039 Hypothyroidism, unspecified: Secondary | ICD-10-CM | POA: Diagnosis not present

## 2018-07-17 DIAGNOSIS — K5909 Other constipation: Secondary | ICD-10-CM | POA: Diagnosis not present

## 2018-07-20 DIAGNOSIS — I130 Hypertensive heart and chronic kidney disease with heart failure and stage 1 through stage 4 chronic kidney disease, or unspecified chronic kidney disease: Secondary | ICD-10-CM | POA: Diagnosis not present

## 2018-07-20 DIAGNOSIS — I5032 Chronic diastolic (congestive) heart failure: Secondary | ICD-10-CM | POA: Diagnosis not present

## 2018-07-20 DIAGNOSIS — N183 Chronic kidney disease, stage 3 (moderate): Secondary | ICD-10-CM | POA: Diagnosis not present

## 2018-07-20 DIAGNOSIS — K5909 Other constipation: Secondary | ICD-10-CM | POA: Diagnosis not present

## 2018-07-20 DIAGNOSIS — F329 Major depressive disorder, single episode, unspecified: Secondary | ICD-10-CM | POA: Diagnosis not present

## 2018-07-20 DIAGNOSIS — I4891 Unspecified atrial fibrillation: Secondary | ICD-10-CM | POA: Diagnosis not present

## 2018-07-20 DIAGNOSIS — I82411 Acute embolism and thrombosis of right femoral vein: Secondary | ICD-10-CM | POA: Diagnosis not present

## 2018-07-20 DIAGNOSIS — F0280 Dementia in other diseases classified elsewhere without behavioral disturbance: Secondary | ICD-10-CM | POA: Diagnosis not present

## 2018-07-20 DIAGNOSIS — E039 Hypothyroidism, unspecified: Secondary | ICD-10-CM | POA: Diagnosis not present

## 2018-07-21 DIAGNOSIS — I130 Hypertensive heart and chronic kidney disease with heart failure and stage 1 through stage 4 chronic kidney disease, or unspecified chronic kidney disease: Secondary | ICD-10-CM | POA: Diagnosis not present

## 2018-07-21 DIAGNOSIS — E039 Hypothyroidism, unspecified: Secondary | ICD-10-CM | POA: Diagnosis not present

## 2018-07-21 DIAGNOSIS — K5909 Other constipation: Secondary | ICD-10-CM | POA: Diagnosis not present

## 2018-07-21 DIAGNOSIS — N183 Chronic kidney disease, stage 3 (moderate): Secondary | ICD-10-CM | POA: Diagnosis not present

## 2018-07-21 DIAGNOSIS — I4891 Unspecified atrial fibrillation: Secondary | ICD-10-CM | POA: Diagnosis not present

## 2018-07-21 DIAGNOSIS — I82411 Acute embolism and thrombosis of right femoral vein: Secondary | ICD-10-CM | POA: Diagnosis not present

## 2018-07-21 DIAGNOSIS — F0280 Dementia in other diseases classified elsewhere without behavioral disturbance: Secondary | ICD-10-CM | POA: Diagnosis not present

## 2018-07-21 DIAGNOSIS — I5032 Chronic diastolic (congestive) heart failure: Secondary | ICD-10-CM | POA: Diagnosis not present

## 2018-07-21 DIAGNOSIS — F329 Major depressive disorder, single episode, unspecified: Secondary | ICD-10-CM | POA: Diagnosis not present

## 2018-07-22 ENCOUNTER — Inpatient Hospital Stay: Payer: Medicare HMO | Attending: Hematology and Oncology

## 2018-07-22 DIAGNOSIS — F0280 Dementia in other diseases classified elsewhere without behavioral disturbance: Secondary | ICD-10-CM | POA: Diagnosis not present

## 2018-07-22 DIAGNOSIS — I130 Hypertensive heart and chronic kidney disease with heart failure and stage 1 through stage 4 chronic kidney disease, or unspecified chronic kidney disease: Secondary | ICD-10-CM | POA: Diagnosis not present

## 2018-07-22 DIAGNOSIS — N183 Chronic kidney disease, stage 3 (moderate): Secondary | ICD-10-CM | POA: Diagnosis not present

## 2018-07-22 DIAGNOSIS — E039 Hypothyroidism, unspecified: Secondary | ICD-10-CM | POA: Diagnosis not present

## 2018-07-22 DIAGNOSIS — K5909 Other constipation: Secondary | ICD-10-CM | POA: Diagnosis not present

## 2018-07-22 DIAGNOSIS — F329 Major depressive disorder, single episode, unspecified: Secondary | ICD-10-CM | POA: Diagnosis not present

## 2018-07-22 DIAGNOSIS — I5032 Chronic diastolic (congestive) heart failure: Secondary | ICD-10-CM | POA: Diagnosis not present

## 2018-07-22 DIAGNOSIS — I4891 Unspecified atrial fibrillation: Secondary | ICD-10-CM | POA: Diagnosis not present

## 2018-07-22 DIAGNOSIS — I82411 Acute embolism and thrombosis of right femoral vein: Secondary | ICD-10-CM | POA: Diagnosis not present

## 2018-07-23 DIAGNOSIS — I82411 Acute embolism and thrombosis of right femoral vein: Secondary | ICD-10-CM | POA: Diagnosis not present

## 2018-07-23 DIAGNOSIS — F0280 Dementia in other diseases classified elsewhere without behavioral disturbance: Secondary | ICD-10-CM | POA: Diagnosis not present

## 2018-07-23 DIAGNOSIS — N183 Chronic kidney disease, stage 3 (moderate): Secondary | ICD-10-CM | POA: Diagnosis not present

## 2018-07-23 DIAGNOSIS — F329 Major depressive disorder, single episode, unspecified: Secondary | ICD-10-CM | POA: Diagnosis not present

## 2018-07-23 DIAGNOSIS — I130 Hypertensive heart and chronic kidney disease with heart failure and stage 1 through stage 4 chronic kidney disease, or unspecified chronic kidney disease: Secondary | ICD-10-CM | POA: Diagnosis not present

## 2018-07-23 DIAGNOSIS — I4891 Unspecified atrial fibrillation: Secondary | ICD-10-CM | POA: Diagnosis not present

## 2018-07-23 DIAGNOSIS — I5032 Chronic diastolic (congestive) heart failure: Secondary | ICD-10-CM | POA: Diagnosis not present

## 2018-07-23 DIAGNOSIS — E039 Hypothyroidism, unspecified: Secondary | ICD-10-CM | POA: Diagnosis not present

## 2018-07-24 ENCOUNTER — Other Ambulatory Visit: Payer: Self-pay | Admitting: Hematology and Oncology

## 2018-07-24 ENCOUNTER — Inpatient Hospital Stay: Payer: Medicare HMO | Admitting: Hematology and Oncology

## 2018-07-24 ENCOUNTER — Inpatient Hospital Stay: Payer: Medicare HMO

## 2018-07-24 DIAGNOSIS — D509 Iron deficiency anemia, unspecified: Secondary | ICD-10-CM

## 2018-07-24 DIAGNOSIS — I5032 Chronic diastolic (congestive) heart failure: Secondary | ICD-10-CM | POA: Diagnosis not present

## 2018-07-24 DIAGNOSIS — I4891 Unspecified atrial fibrillation: Secondary | ICD-10-CM | POA: Diagnosis not present

## 2018-07-24 DIAGNOSIS — I82411 Acute embolism and thrombosis of right femoral vein: Secondary | ICD-10-CM | POA: Diagnosis not present

## 2018-07-24 DIAGNOSIS — N183 Chronic kidney disease, stage 3 (moderate): Secondary | ICD-10-CM | POA: Diagnosis not present

## 2018-07-24 DIAGNOSIS — F0280 Dementia in other diseases classified elsewhere without behavioral disturbance: Secondary | ICD-10-CM | POA: Diagnosis not present

## 2018-07-24 DIAGNOSIS — I130 Hypertensive heart and chronic kidney disease with heart failure and stage 1 through stage 4 chronic kidney disease, or unspecified chronic kidney disease: Secondary | ICD-10-CM | POA: Diagnosis not present

## 2018-07-24 DIAGNOSIS — E039 Hypothyroidism, unspecified: Secondary | ICD-10-CM | POA: Diagnosis not present

## 2018-07-24 DIAGNOSIS — K5909 Other constipation: Secondary | ICD-10-CM | POA: Diagnosis not present

## 2018-07-24 DIAGNOSIS — F329 Major depressive disorder, single episode, unspecified: Secondary | ICD-10-CM | POA: Diagnosis not present

## 2018-07-24 NOTE — Progress Notes (Deleted)
[place] [facility] day:  07/24/18  Chief Complaint: Nicole Bishop is a 84 y.o. female with stage III chronic kidney disease and iron deficiency who is seen for 1 month assessment.  HPI: The patient was last seen in the medical oncology clinic on 06/19/2018.  At that time, her energy was "okay".  Patient was participating in physical therapy at SNF.  She noted generalized weakness.  Patient was experiencing intermittent VH secondary to her dementia diagnosis.  Exam revealed bilateral ankle edema, and tender edema and the right lower extremity.    Labs on 06/09/2018 revealed a hemoglobin was 10.0, hematocrit 32.5, MCV 88.3, and platelets 196,000.  Reticulocyte count was 1.3%.  Iron saturation was 6% with a TIBC of 274.  Ferritin was 77.  She received Venofer on 06/11/2018.  We discussed follow-up with GI.  She continued to follow-up with nephrology.  Follow-up labs were ordered (not done).  During the interim,    Past Medical History:  Diagnosis Date  . Anemia   . Anxiety   . Arthritis   . Chronic kidney disease   . Coronary artery disease   . Depression   . Dizziness   . Dyspnea   . Dysrhythmia    gets tachy if anxious or excited  . GERD (gastroesophageal reflux disease)   . History of hiatal hernia   . HOH (hard of hearing)   . Hypertension   . Hypothyroidism   . IDA (iron deficiency anemia) 12/14/2014  . Myocardial infarction ([place]) 1992  . Renal insufficiency   . Sleep apnea   . Tremors of nervous system     Past Surgical History:  Procedure Laterality Date  . CATARACT EXTRACTION W/PHACO Left 07/15/2017   Procedure: CATARACT EXTRACTION PHACO AND INTRAOCULAR LENS PLACEMENT (IOC)-LEFT;  Surgeon: Birder Robson, MD;  Location: [facility] ORS;  Service: Ophthalmology;  Laterality: Left;  [place] 00.36.1 AP% 13.9 CDE 5.02 Fluid Pack lot # [id number]  . CATARACT EXTRACTION W/PHACO Right 08/26/2017   Procedure: CATARACT EXTRACTION PHACO AND  INTRAOCULAR LENS PLACEMENT (IOC);  Surgeon: Birder Robson, MD;  Location: [facility] ORS;  Service: Ophthalmology;  Laterality: Right;  [place] 00:41.0 AP% 15.3 CDE 6.27 FLUID PACK LOT # [id number]  . CHOLECYSTECTOMY    . COLONOSCOPY WITH PROPOFOL N/A 04/10/2015   Procedure: COLONOSCOPY WITH PROPOFOL;  Surgeon: Manya Silvas, MD;  Location: [facility] ENDOSCOPY;  Service: Endoscopy;  Laterality: N/A;  . ESOPHAGOGASTRODUODENOSCOPY (EGD) WITH PROPOFOL N/A 04/10/2015   Procedure: ESOPHAGOGASTRODUODENOSCOPY (EGD) WITH PROPOFOL;  Surgeon: Manya Silvas, MD;  Location: [facility] ENDOSCOPY;  Service: Endoscopy;  Laterality: N/A;  . fatty tumor removed on left shoulder    . gallbladdedr removed    . JOINT REPLACEMENT Bilateral    total knee replacements  . REPLACEMENT TOTAL KNEE BILATERAL Bilateral     Family History  Problem Relation Age of Onset  . Arthritis Mother   . Hypertension Mother   . Stroke Mother   . Hypertension Father   . Heart attack Father   . Breast cancer Neg Hx     Social History:  reports that she has never smoked. She has never used smokeless tobacco. She reports that she does not drink alcohol or use drugs. The patient lives in [place]. Her daughter, [name]: phone [phone] and cell [phone]. She is accompanied by her "baby boy" [name] today.   Allergies:  Allergies  Allergen Reactions  . Penicillins Rash and Other (See Comments)  Has patient had a PCN reaction causing immediate rash, facial/tongue/throat swelling, SOB or lightheadedness with hypotension: Unknown Has patient had a PCN reaction causing severe rash involving mucus membranes or skin necrosis: Unknown Has patient had a PCN reaction that required hospitalization: Unknown Has patient had a PCN reaction occurring within the last 10 years: Unknown If all of the above answers are "NO", then may proceed with Cephalosporin use.   . Nsaids Other (See Comments)    GI upset  . Atorvastatin Rash  . Celebrex  [Celecoxib] Other (See Comments)    Constipation and stomach upset  . Felodipine Nausea Only  . Oxaprozin Nausea Only  . Rofecoxib Other (See Comments)    GI upset  . Simvastatin Rash    Current Medications: Current Outpatient Medications  Medication Sig Dispense Refill  . acetaminophen (TYLENOL) 650 MG CR tablet Take 650 mg by mouth every 8 (eight) hours as needed for pain.    Marland Kitchen amLODipine (NORVASC) 5 MG tablet Take 5 mg by mouth daily.    . calcitRIOL (ROCALTROL) 0.25 MCG capsule Take 1 capsule by mouth daily.    . Cholecalciferol (VITAMIN D3) 2000 units capsule Take 4,000 Units by mouth daily.    . CYANOCOBALAMIN PO Take 1,000 mcg by mouth daily.    Marland Kitchen docusate sodium (COLACE) 100 MG capsule Take 100 mg by mouth 2 (two) times daily.     Marland Kitchen esomeprazole (NEXIUM) 40 MG capsule Take 40 mg by mouth daily as needed (acid reflux).     . Ferrous Sulfate (SLOW FE) 142 (45 Fe) MG TBCR Take 1 tablet by mouth daily.    Marland Kitchen lactulose (CONSTULOSE) 10 GM/15ML solution Take 10 g by mouth daily as needed for mild constipation.     Marland Kitchen loratadine (CLARITIN) 10 MG tablet Take 1 tablet (10 mg total) by mouth daily. 30 tablet 0  . metoprolol succinate (TOPROL-XL) 50 MG 24 hr tablet Take 1 tablet (50 mg total) by mouth daily. Take with or immediately following a meal. 30 tablet 0  . mometasone (NASONEX) 50 MCG/ACT nasal spray Place 2 sprays into the nose daily as needed (allergies).     Marland Kitchen olopatadine (PATANOL) 0.1 % ophthalmic solution Place 1 drop into both eyes 2 (two) times daily as needed for allergies.     . polyethylene glycol (MIRALAX / GLYCOLAX) packet Take 17 g by mouth daily as needed for mild constipation or moderate constipation.    . Rivaroxaban (XARELTO) 15 MG TABS tablet Take 1 tablet (15 mg total) by mouth 2 (two) times daily with a meal for 17 days. Through 06/29/2018 then 15 mg daily (Patient not taking: Reported on 06/19/2018) 42 tablet 0  . rivaroxaban (XARELTO) 20 MG TABS tablet Take 1 tablet  (20 mg total) by mouth daily with supper. 30 tablet 0   No current facility-administered medications for this visit.    Facility-Administered Medications Ordered in Other Visits  Medication Dose Route Frequency Provider Last Rate Last Dose  . 0.9 %  sodium chloride infusion   Intravenous Continuous Lequita Asal, MD   Stopped at 02/06/18 1429    Review of Systems  Constitutional: Negative for diaphoresis, fever, malaise/fatigue and weight loss (up 2 pounds).  HENT: Negative.   Eyes: Negative.   Respiratory: Negative for cough, hemoptysis, sputum production and shortness of breath.   Cardiovascular: Positive for leg swelling (RIGHT). Negative for chest pain, palpitations, orthopnea and PND.       PMH (+) for atrial fibrillation.  Known  infrarenal aneurysm.  Gastrointestinal: Negative for abdominal pain, blood in stool, constipation, diarrhea, melena, nausea and vomiting.  Genitourinary: Negative for dysuria, frequency, hematuria and urgency.       CKD-followed by nephrology  Musculoskeletal: Negative for back pain, falls, joint pain and myalgias.  Skin: Negative for itching and rash.  Neurological: Positive for dizziness and weakness (generalized). Negative for tremors and headaches.  Endo/Heme/Allergies: Does not bruise/bleed easily.  Psychiatric/Behavioral: Positive for hallucinations (visual). Negative for depression, memory loss and suicidal ideas. The patient is not nervous/anxious and does not have insomnia.        Dementia  All other systems reviewed and are negative.  Performance status (ECOG): 2 - Symptomatic, <50% confined to bed  Vital Signs: There were no vitals taken for this visit.  Physical Exam  Constitutional: She is oriented to person, place, and time and well-developed, well-nourished, and in no distress.  HENT:  Head: Normocephalic and atraumatic.  Mouth/Throat: Oropharynx is clear and moist and mucous membranes are normal.  Eyes: Pupils are equal, round,  and reactive to light. EOM are normal. No scleral icterus.  BILATERAL arcus senilis  Neck: Normal range of motion. Neck supple. No tracheal deviation present. No thyromegaly present.  Cardiovascular: Normal rate, regular rhythm, normal heart sounds and intact distal pulses. Exam reveals no gallop and no friction rub.  No murmur heard. Pulmonary/Chest: Effort normal and breath sounds normal. No respiratory distress. She has no wheezes. She has no rales.  Abdominal: Soft. Bowel sounds are normal. She exhibits no distension. There is no tenderness.  Musculoskeletal: Normal range of motion. She exhibits edema.       Right ankle: She exhibits swelling.       Left ankle: She exhibits swelling.       Right lower leg: She exhibits tenderness and swelling.  Neurological: She is alert and oriented to person, place, and time.  Skin: Skin is warm and dry. No rash noted. No erythema.  Psychiatric: Mood, affect and judgment normal.  Nursing note and vitals reviewed.    No visits with results within 3 Day(s) from this visit.  Latest known visit with results is:  Admission on 06/08/2018, Discharged on 06/12/2018  Component Date Value Ref Range Status  . Color, Urine 06/08/2018 YELLOW* YELLOW Final  . APPearance 06/08/2018 HAZY* CLEAR Final  . Specific Gravity, Urine 06/08/2018 1.012  1.005 - 1.030 Final  . pH 06/08/2018 5.0  5.0 - 8.0 Final  . Glucose, UA 06/08/2018 NEGATIVE  NEGATIVE mg/dL Final  . Hgb urine dipstick 06/08/2018 SMALL* NEGATIVE Final  . Bilirubin Urine 06/08/2018 NEGATIVE  NEGATIVE Final  . Ketones, ur 06/08/2018 NEGATIVE  NEGATIVE mg/dL Final  . Protein, ur 06/08/2018 NEGATIVE  NEGATIVE mg/dL Final  . Nitrite 06/08/2018 NEGATIVE  NEGATIVE Final  . Leukocytes, UA 06/08/2018 SMALL* NEGATIVE Final  . RBC / HPF 06/08/2018 0-5  0 - 5 RBC/hpf Final  . WBC, UA 06/08/2018 0-5  0 - 5 WBC/hpf Final  . Bacteria, UA 06/08/2018 NONE SEEN  NONE SEEN Final  . Squamous Epithelial / LPF  06/08/2018 6-10  0 - 5 Final  . Mucus 06/08/2018 PRESENT   Final  . Hyaline Casts, UA 06/08/2018 PRESENT   Final   Performed at [facility], [address], [place], [place] [address]  . Sodium 06/08/2018 140  135 - 145 mmol/L Final  . Potassium 06/08/2018 4.2  3.5 - 5.1 mmol/L Final  . Chloride 06/08/2018 110  98 - 111 mmol/L Final  .  CO2 06/08/2018 25  22 - 32 mmol/L Final  . Glucose, Bld 06/08/2018 100* 70 - 99 mg/dL Final  . BUN 06/08/2018 23  8 - 23 mg/dL Final  . Creatinine, Ser 06/08/2018 1.09* 0.44 - 1.00 mg/dL Final  . Calcium 06/08/2018 9.4  8.9 - 10.3 mg/dL Final  . GFR calc non Af Amer 06/08/2018 45* >60 mL/min Final  . GFR calc Af Amer 06/08/2018 52* >60 mL/min Final   Comment: (NOTE) The eGFR has been calculated using the CKD EPI equation. This calculation has not been validated in all clinical situations. eGFR's persistently <60 mL/min signify possible Chronic Kidney Disease.   Georgiann Hahn gap 06/08/2018 5  5 - 15 Final   Performed at [facility], [address] [place], [place] [address]  . WBC 06/08/2018 4.6  4.0 - 10.5 K/uL Final  . RBC 06/08/2018 3.90  3.87 - 5.11 MIL/uL Final  . Hemoglobin 06/08/2018 10.6* 12.0 - 15.0 g/dL Final  . HCT 06/08/2018 34.3* 36.0 - 46.0 % Final  . MCV 06/08/2018 87.9  80.0 - 100.0 fL Final  . MCH 06/08/2018 27.2  26.0 - 34.0 pg Final  . MCHC 06/08/2018 30.9  30.0 - 36.0 g/dL Final  . RDW 06/08/2018 14.8  11.5 - 15.5 % Final  . Platelets 06/08/2018 213  150 - 400 K/uL Final  . nRBC 06/08/2018 0.0  0.0 - 0.2 % Final   Performed at [facility], [address] [place], [place] [address]  . Prothrombin Time 06/09/2018 15.1  11.4 - 15.2 seconds Final  . INR 06/09/2018 1.20   Final   Performed at [facility], [address] [place], [place] [address]  . aPTT 06/09/2018 30  24 - 36 seconds Final   Performed at [facility] [address] [place], [place] [address]  . Heparin  Unfractionated 06/09/2018 1.47* 0.30 - 0.70 IU/mL Final   Comment: (NOTE) If heparin results are below expected values, and patient dosage has  been confirmed, suggest follow up testing of antithrombin III levels. Performed at [facility], [address], [place], [place] [address]   . Sodium 06/09/2018 142  135 - 145 mmol/L Final  . Potassium 06/09/2018 3.7  3.5 - 5.1 mmol/L Final  . Chloride 06/09/2018 112* 98 - 111 mmol/L Final  . CO2 06/09/2018 24  22 - 32 mmol/L Final  . Glucose, Bld 06/09/2018 94  70 - 99 mg/dL Final  . BUN 06/09/2018 18  8 - 23 mg/dL Final  . Creatinine, Ser 06/09/2018 0.94  0.44 - 1.00 mg/dL Final  . Calcium 06/09/2018 9.1  8.9 - 10.3 mg/dL Final  . GFR calc non Af Amer 06/09/2018 54* >60 mL/min Final  . GFR calc Af Amer 06/09/2018 >60  >60 mL/min Final   Comment: (NOTE) The eGFR has been calculated using the CKD EPI equation. This calculation has not been validated in all clinical situations. eGFR's persistently <60 mL/min signify possible Chronic Kidney Disease.   Georgiann Hahn gap 06/09/2018 6  5 - 15 Final   Performed at [facility], [address] [place], [place] [address]  . WBC 06/09/2018 4.0  4.0 - 10.5 K/uL Final  . RBC 06/09/2018 3.68* 3.87 - 5.11 MIL/uL Final  . Hemoglobin 06/09/2018 10.0* 12.0 - 15.0 g/dL Final  . HCT 06/09/2018 32.5* 36.0 - 46.0 % Final  . MCV 06/09/2018 88.3  80.0 - 100.0 fL Final  . MCH 06/09/2018 27.2  26.0 - 34.0  pg Final  . MCHC 06/09/2018 30.8  30.0 - 36.0 g/dL Final  . RDW 06/09/2018 14.8  11.5 - 15.5 % Final  . Platelets 06/09/2018 196  150 - 400 K/uL Final  . nRBC 06/09/2018 0.0  0.0 - 0.2 % Final   Performed at [facility], [address] [place], [place] [address]  . aPTT 06/09/2018 82* 24 - 36 seconds Final   Comment:        IF BASELINE aPTT IS ELEVATED, SUGGEST PATIENT RISK ASSESSMENT BE USED TO DETERMINE APPROPRIATE ANTICOAGULANT THERAPY. Performed at [facility],  [address] [place], [place] [address]   . Retic Ct Pct 06/11/2018 1.3  0.4 - 3.1 % Final  . RBC. 06/11/2018 3.75* 3.87 - 5.11 MIL/uL Final  . Retic Count, Absolute 06/11/2018 49.9  19.0 - 186.0 K/uL Final  . Immature Retic Fract 06/11/2018 11.9  2.3 - 15.9 % Final   Performed at [facility], [address], [place], [place] [address]  . Ferritin 06/11/2018 77  11 - 307 ng/mL Final   Performed at [facility], [address] [place], [place] [address]  . Iron 06/11/2018 16* 28 - 170 ug/dL Final  . TIBC 06/11/2018 274  250 - 450 ug/dL Final  . Saturation Ratios 06/11/2018 6* 10.4 - 31.8 % Final  . UIBC 06/11/2018 258  ug/dL Final   Performed at [facility], [address], [place], [place] [address]  . Sed Rate 06/11/2018 54* 0 - 30 mm/hr Final   Performed at [facility], [address] [place], [place] [address]    Assessment:  Nicole Bishop is a 84 y.o. African American woman with stage III chronic kidney disease and iron deficiency.  Hematocrit and MCV were normal on 09/03/2013.    She denies any GI bleeding.  Diet is modest.  EGD on 04/10/2015 revealed a large hiatal hernia and gastritis.  Biopsy was unremarkable.  Colonoscopy on 04/10/2015 revealed diverticulosis in the sigmoid, descending, transverse, and ascending colon.  She had internal hemorrhoids.  She is B12 deficient.  She received monthly injections at the [facility] (last 07/2016).  She began oral B12 in 08/2016.  B12 and folate were normal on 02/03/2018.  She is intolerant of oral iron secondary to baseline chronic constipation.   Labs on 09/12/2014 revealed a hematocrit of 30 with an MCV of 67. Ferritin was 6 (low) with a TIBC of 487 (high).  Normal studies included direct [place], folic acid, V67, haptoglobin, erythropoietin, and SPEP.    She has received weekly Venofer:  800 mg (10/21/2014 - 11/11/2014), 400 mg (03/03/2015 and 03/10/2015), 400 mg (10/20/2015 and  10/27/2015), 200 mg (04/16/2016), 600 mg (11/11/2016 - 11/25/2016), 600 mg (05/23/2017 - 06/06/2017), 400 mg (02/06/2018 - 02/13/2018), and 200 mg (06/11/2018).  She receives Venofer if her ferritin is < 30.    Ferritin has been followed:  14 on 12/28/2014, 9 on 02/23/2015, 113 on 03/17/2015, 12 on 10/13/2015, 31 on 01/10/2016, 19 on 04/11/2016, 23 on 07/18/2016, 9 on 11/11/2016, 9 on 05/13/2017, 46 on 08/07/2017, 41 on 11/10/2017, 32 on 02/03/2018, 132 on 05/11/2018, and 77 on 06/11/2018.  Goal ferritin is 100.  Admitted to [facility] from 06/09/2018 - 06/12/2018 for DVT. It was discovered the patient was intermittently taking her apixaban citing the fact that it made her "feel bad".  Patient had been off medication for about a month.  She was treated with intravenous heparin during her admission.  Patient  was seen by vascular Delana Meyer, MD) and the recommendation was made to switch to rivaroxaban daily to promote compliance.  Patient received Venofer 200 mg IV x1 dose.  Patient was discharged [facility] on 06/12/2018.  Symptomatically, patient notes that her energy is "okay".  Patient is participating in physical therapy at SNF.  She notes generalized weakness.  Patient experiencing intermittent VH secondary to her dementia diagnosis.  Exam reveals bilateral ankle edema, and tender edema and the right lower extremity.  Exam otherwise unremarkable.   Plan: 1. Labs today:  CBC with diff, ferritin, iron studies, sed rate.   2. RIGHT lower extremity DVT  Successfully anticoagulated with heparin during recent admission to the hospital.  Transitioned to rivaroxaban prior to discharge to promote compliance.  Some swelling remains, however denies claudication pain.  No shortness of breath. 3. Recurrent iron deficiency anemia  Reviewed labs from inpatient admission  Hemoglobin 10.0, hematocrit 32.5, MCV 88.3, and platelets 196,000  Reticulocytes 1.3%  Iron saturation 6% with a TIBC of 274  ug/dL.  Ferritin was 77 ng/mL.  ESR elevated at 54 mm/hr  Ferritin felt to be falsely elevated due to elevated sedimentation rate.  Patient received Venofer 200 mg IV on 06/11/2018.  Discuss need for follow-up with gastroenterology Vira Agar, MD) for consideration of possible VCE study. 4. Stage III chronic kidney disease  BUN 18 and creatinine 0.94 mg/dL.  Discussed potential need for additional support (Procrit) if hemoglobin drifts below 10 following IV iron replacement.  Followed by nephrology. 5. B12 deficiency  B12 and folate levels normal when checked in 01/2018.  Continue daily oral B12 1000 mcg supplementation. 6. Infrarenal aneurysm  Noted on abdominal and pelvic CT on 01/19/2017.  Follow-up ultrasound recommended in 1 year (overdue).  Will defer ordering to PCP. 7. RTC in 2 weeks for labs (CBC with differential, ferritin, ESR). 8. RTC in 1 month for MD assessment, labs (CBC with differential, ferritin-day before), and +/- Venofer.    Lequita Asal, MD  07/24/2018, 4:20 AM   I saw and evaluated the patient, participating in the key portions of the service and reviewing pertinent diagnostic studies and records.  I reviewed the nurse practitioner's note and agree with the findings and the plan.  The assessment and plan were discussed with the patient.  A few questions were asked by the patient and answered.   Lequita Asal, MD  07/24/2018, 4:20 AM

## 2018-07-27 DIAGNOSIS — E039 Hypothyroidism, unspecified: Secondary | ICD-10-CM | POA: Diagnosis not present

## 2018-07-27 DIAGNOSIS — N183 Chronic kidney disease, stage 3 (moderate): Secondary | ICD-10-CM | POA: Diagnosis not present

## 2018-07-27 DIAGNOSIS — I130 Hypertensive heart and chronic kidney disease with heart failure and stage 1 through stage 4 chronic kidney disease, or unspecified chronic kidney disease: Secondary | ICD-10-CM | POA: Diagnosis not present

## 2018-07-27 DIAGNOSIS — I5032 Chronic diastolic (congestive) heart failure: Secondary | ICD-10-CM | POA: Diagnosis not present

## 2018-07-27 DIAGNOSIS — F0280 Dementia in other diseases classified elsewhere without behavioral disturbance: Secondary | ICD-10-CM | POA: Diagnosis not present

## 2018-07-27 DIAGNOSIS — F329 Major depressive disorder, single episode, unspecified: Secondary | ICD-10-CM | POA: Diagnosis not present

## 2018-07-27 DIAGNOSIS — I4891 Unspecified atrial fibrillation: Secondary | ICD-10-CM | POA: Diagnosis not present

## 2018-07-27 DIAGNOSIS — I82411 Acute embolism and thrombosis of right femoral vein: Secondary | ICD-10-CM | POA: Diagnosis not present

## 2018-07-27 DIAGNOSIS — K5909 Other constipation: Secondary | ICD-10-CM | POA: Diagnosis not present

## 2018-07-28 DIAGNOSIS — K5909 Other constipation: Secondary | ICD-10-CM | POA: Diagnosis not present

## 2018-07-28 DIAGNOSIS — I5032 Chronic diastolic (congestive) heart failure: Secondary | ICD-10-CM | POA: Diagnosis not present

## 2018-07-28 DIAGNOSIS — I82411 Acute embolism and thrombosis of right femoral vein: Secondary | ICD-10-CM | POA: Diagnosis not present

## 2018-07-28 DIAGNOSIS — I4891 Unspecified atrial fibrillation: Secondary | ICD-10-CM | POA: Diagnosis not present

## 2018-07-28 DIAGNOSIS — I130 Hypertensive heart and chronic kidney disease with heart failure and stage 1 through stage 4 chronic kidney disease, or unspecified chronic kidney disease: Secondary | ICD-10-CM | POA: Diagnosis not present

## 2018-07-28 DIAGNOSIS — F329 Major depressive disorder, single episode, unspecified: Secondary | ICD-10-CM | POA: Diagnosis not present

## 2018-07-28 DIAGNOSIS — E039 Hypothyroidism, unspecified: Secondary | ICD-10-CM | POA: Diagnosis not present

## 2018-07-28 DIAGNOSIS — N183 Chronic kidney disease, stage 3 (moderate): Secondary | ICD-10-CM | POA: Diagnosis not present

## 2018-07-28 DIAGNOSIS — F0280 Dementia in other diseases classified elsewhere without behavioral disturbance: Secondary | ICD-10-CM | POA: Diagnosis not present

## 2018-07-29 DIAGNOSIS — E039 Hypothyroidism, unspecified: Secondary | ICD-10-CM | POA: Diagnosis not present

## 2018-07-29 DIAGNOSIS — I5032 Chronic diastolic (congestive) heart failure: Secondary | ICD-10-CM | POA: Diagnosis not present

## 2018-07-29 DIAGNOSIS — K5909 Other constipation: Secondary | ICD-10-CM | POA: Diagnosis not present

## 2018-07-29 DIAGNOSIS — N183 Chronic kidney disease, stage 3 (moderate): Secondary | ICD-10-CM | POA: Diagnosis not present

## 2018-07-29 DIAGNOSIS — F329 Major depressive disorder, single episode, unspecified: Secondary | ICD-10-CM | POA: Diagnosis not present

## 2018-07-29 DIAGNOSIS — I4891 Unspecified atrial fibrillation: Secondary | ICD-10-CM | POA: Diagnosis not present

## 2018-07-29 DIAGNOSIS — F0280 Dementia in other diseases classified elsewhere without behavioral disturbance: Secondary | ICD-10-CM | POA: Diagnosis not present

## 2018-07-29 DIAGNOSIS — I82411 Acute embolism and thrombosis of right femoral vein: Secondary | ICD-10-CM | POA: Diagnosis not present

## 2018-07-29 DIAGNOSIS — I130 Hypertensive heart and chronic kidney disease with heart failure and stage 1 through stage 4 chronic kidney disease, or unspecified chronic kidney disease: Secondary | ICD-10-CM | POA: Diagnosis not present

## 2018-07-30 ENCOUNTER — Ambulatory Visit: Payer: Medicare HMO | Attending: Neurology

## 2018-07-30 DIAGNOSIS — E669 Obesity, unspecified: Secondary | ICD-10-CM | POA: Diagnosis not present

## 2018-07-30 DIAGNOSIS — G4710 Hypersomnia, unspecified: Secondary | ICD-10-CM | POA: Diagnosis not present

## 2018-07-30 DIAGNOSIS — I5032 Chronic diastolic (congestive) heart failure: Secondary | ICD-10-CM | POA: Diagnosis not present

## 2018-07-30 DIAGNOSIS — R0683 Snoring: Secondary | ICD-10-CM | POA: Insufficient documentation

## 2018-07-30 DIAGNOSIS — F329 Major depressive disorder, single episode, unspecified: Secondary | ICD-10-CM | POA: Diagnosis not present

## 2018-07-30 DIAGNOSIS — I130 Hypertensive heart and chronic kidney disease with heart failure and stage 1 through stage 4 chronic kidney disease, or unspecified chronic kidney disease: Secondary | ICD-10-CM | POA: Diagnosis not present

## 2018-07-30 DIAGNOSIS — I4891 Unspecified atrial fibrillation: Secondary | ICD-10-CM | POA: Diagnosis not present

## 2018-07-30 DIAGNOSIS — I10 Essential (primary) hypertension: Secondary | ICD-10-CM | POA: Diagnosis not present

## 2018-07-30 DIAGNOSIS — N183 Chronic kidney disease, stage 3 (moderate): Secondary | ICD-10-CM | POA: Diagnosis not present

## 2018-07-30 DIAGNOSIS — G4761 Periodic limb movement disorder: Secondary | ICD-10-CM | POA: Diagnosis not present

## 2018-07-30 DIAGNOSIS — E039 Hypothyroidism, unspecified: Secondary | ICD-10-CM | POA: Diagnosis not present

## 2018-07-30 DIAGNOSIS — K5909 Other constipation: Secondary | ICD-10-CM | POA: Diagnosis not present

## 2018-07-30 DIAGNOSIS — I82411 Acute embolism and thrombosis of right femoral vein: Secondary | ICD-10-CM | POA: Diagnosis not present

## 2018-07-30 DIAGNOSIS — F0280 Dementia in other diseases classified elsewhere without behavioral disturbance: Secondary | ICD-10-CM | POA: Diagnosis not present

## 2018-07-30 DIAGNOSIS — G4730 Sleep apnea, unspecified: Secondary | ICD-10-CM | POA: Diagnosis not present

## 2018-07-30 DIAGNOSIS — G4733 Obstructive sleep apnea (adult) (pediatric): Secondary | ICD-10-CM | POA: Insufficient documentation

## 2018-07-31 DIAGNOSIS — F0280 Dementia in other diseases classified elsewhere without behavioral disturbance: Secondary | ICD-10-CM | POA: Diagnosis not present

## 2018-07-31 DIAGNOSIS — I82411 Acute embolism and thrombosis of right femoral vein: Secondary | ICD-10-CM | POA: Diagnosis not present

## 2018-07-31 DIAGNOSIS — F329 Major depressive disorder, single episode, unspecified: Secondary | ICD-10-CM | POA: Diagnosis not present

## 2018-07-31 DIAGNOSIS — I130 Hypertensive heart and chronic kidney disease with heart failure and stage 1 through stage 4 chronic kidney disease, or unspecified chronic kidney disease: Secondary | ICD-10-CM | POA: Diagnosis not present

## 2018-07-31 DIAGNOSIS — K5909 Other constipation: Secondary | ICD-10-CM | POA: Diagnosis not present

## 2018-07-31 DIAGNOSIS — E039 Hypothyroidism, unspecified: Secondary | ICD-10-CM | POA: Diagnosis not present

## 2018-07-31 DIAGNOSIS — I4891 Unspecified atrial fibrillation: Secondary | ICD-10-CM | POA: Diagnosis not present

## 2018-07-31 DIAGNOSIS — I5032 Chronic diastolic (congestive) heart failure: Secondary | ICD-10-CM | POA: Diagnosis not present

## 2018-07-31 DIAGNOSIS — N183 Chronic kidney disease, stage 3 (moderate): Secondary | ICD-10-CM | POA: Diagnosis not present

## 2018-08-03 ENCOUNTER — Ambulatory Visit
Admission: RE | Admit: 2018-08-03 | Discharge: 2018-08-03 | Disposition: A | Discharge status: Home / Self Care | Payer: Medicare HMO | Source: Ambulatory Visit | Attending: Internal Medicine | Admitting: Internal Medicine

## 2018-08-03 DIAGNOSIS — F329 Major depressive disorder, single episode, unspecified: Secondary | ICD-10-CM | POA: Diagnosis not present

## 2018-08-03 DIAGNOSIS — Z1231 Encounter for screening mammogram for malignant neoplasm of breast: Secondary | ICD-10-CM | POA: Insufficient documentation

## 2018-08-03 DIAGNOSIS — E039 Hypothyroidism, unspecified: Secondary | ICD-10-CM | POA: Diagnosis not present

## 2018-08-03 DIAGNOSIS — I130 Hypertensive heart and chronic kidney disease with heart failure and stage 1 through stage 4 chronic kidney disease, or unspecified chronic kidney disease: Secondary | ICD-10-CM | POA: Diagnosis not present

## 2018-08-03 DIAGNOSIS — I4891 Unspecified atrial fibrillation: Secondary | ICD-10-CM | POA: Diagnosis not present

## 2018-08-03 DIAGNOSIS — F0280 Dementia in other diseases classified elsewhere without behavioral disturbance: Secondary | ICD-10-CM | POA: Diagnosis not present

## 2018-08-03 DIAGNOSIS — I82411 Acute embolism and thrombosis of right femoral vein: Secondary | ICD-10-CM | POA: Diagnosis not present

## 2018-08-03 DIAGNOSIS — N183 Chronic kidney disease, stage 3 (moderate): Secondary | ICD-10-CM | POA: Diagnosis not present

## 2018-08-03 DIAGNOSIS — K5909 Other constipation: Secondary | ICD-10-CM | POA: Diagnosis not present

## 2018-08-03 DIAGNOSIS — I5032 Chronic diastolic (congestive) heart failure: Secondary | ICD-10-CM | POA: Diagnosis not present

## 2018-08-05 ENCOUNTER — Other Ambulatory Visit: Payer: Self-pay | Admitting: Internal Medicine

## 2018-08-05 ENCOUNTER — Other Ambulatory Visit: Payer: Self-pay

## 2018-08-05 DIAGNOSIS — I130 Hypertensive heart and chronic kidney disease with heart failure and stage 1 through stage 4 chronic kidney disease, or unspecified chronic kidney disease: Secondary | ICD-10-CM | POA: Diagnosis not present

## 2018-08-05 DIAGNOSIS — F0280 Dementia in other diseases classified elsewhere without behavioral disturbance: Secondary | ICD-10-CM | POA: Diagnosis not present

## 2018-08-05 DIAGNOSIS — E039 Hypothyroidism, unspecified: Secondary | ICD-10-CM | POA: Diagnosis not present

## 2018-08-05 DIAGNOSIS — I5032 Chronic diastolic (congestive) heart failure: Secondary | ICD-10-CM | POA: Diagnosis not present

## 2018-08-05 DIAGNOSIS — N183 Chronic kidney disease, stage 3 (moderate): Secondary | ICD-10-CM | POA: Diagnosis not present

## 2018-08-05 DIAGNOSIS — R928 Other abnormal and inconclusive findings on diagnostic imaging of breast: Secondary | ICD-10-CM

## 2018-08-05 DIAGNOSIS — K5909 Other constipation: Secondary | ICD-10-CM | POA: Diagnosis not present

## 2018-08-05 DIAGNOSIS — I82411 Acute embolism and thrombosis of right femoral vein: Secondary | ICD-10-CM | POA: Diagnosis not present

## 2018-08-05 DIAGNOSIS — I4891 Unspecified atrial fibrillation: Secondary | ICD-10-CM | POA: Diagnosis not present

## 2018-08-05 DIAGNOSIS — F329 Major depressive disorder, single episode, unspecified: Secondary | ICD-10-CM | POA: Diagnosis not present

## 2018-08-05 NOTE — Patient Outreach (Signed)
  [facility]) Care Management  08/05/2018  Nicole Bishop 11/16/1930 [id number]   Telephone Screen  Referral Date: 08/05/18 Referral Source: [facility] Referral Reason: " HF, COPD, patient needs continuous nursing services for monitoring current health issues" Insurance: [facility]   Outreach attempt # 1 to patient. Spoke with patient. She is alert and oriented and able to answers RN CM questions and screening completed.   Social: Patient resides in her home alone. She voices that she is fairly independent. She states that her son  and daughter live nearby and assist with her care as needed. Son currently in the home with patient. She denies any recent falls. She reports that she has a walker and uses it for ambulation.  Conditions: Per chart review, patient has PMH of A-fib,anemia, anxiety, arthritis, CKD,CAD, depression, GERD, hiatal hernia, HOH, HTN, MI and sleep apnea. Patient was admitted to the hospital on 06/08/18 then discharged to [facility]. She reports that she has been having ongoing issues with "being dizzy all the time." patient states that she has BP machine in the home and normally her children check BP for her. She voices that BP normally is in the 170's. She states she has a scale but it has not been working lately. Patient repots that she has "swelling all the time that is pretty bad." She denies any SOB. She states that her breathing has been better since she started using CPAP. Patient states that she is being discharged from [facility] services this week. She voices that she needs further education,support and guidance in managing her medical conditions. She feels home visits would be best as she is [facility] and has trouble hearing over the phone. RN CM explained to patient [facility] community services vs [facility] services and the differences. Patient voiced understanding.   Medications: Per patient report, she is taking about nine or so meds. She denies any issues  affording and/or managing meds. She states that her daughter fills her med planner weekly for her.    Appointments: Patient unsure of when her next appts are. She voices that she has seen PCP within the last month.    Consent: [facility] services reviewed and discussed with patient.Verbal consent for services provided by patient. She denies needing any other services at this time besides community nursing.    Plan: RN CM will end [facility] community nurse referral for further in home eval/assessment of needs and mgmt of chronic  Conditions.    Enzo Montgomery, RN,BSN,CCM [facility] Telephonic Care Management Coordinator Direct Phone: [phone] Toll Free: [phone] Fax: [phone]

## 2018-08-06 DIAGNOSIS — I5032 Chronic diastolic (congestive) heart failure: Secondary | ICD-10-CM | POA: Diagnosis not present

## 2018-08-06 DIAGNOSIS — F329 Major depressive disorder, single episode, unspecified: Secondary | ICD-10-CM | POA: Diagnosis not present

## 2018-08-06 DIAGNOSIS — E039 Hypothyroidism, unspecified: Secondary | ICD-10-CM | POA: Diagnosis not present

## 2018-08-06 DIAGNOSIS — I130 Hypertensive heart and chronic kidney disease with heart failure and stage 1 through stage 4 chronic kidney disease, or unspecified chronic kidney disease: Secondary | ICD-10-CM | POA: Diagnosis not present

## 2018-08-06 DIAGNOSIS — N183 Chronic kidney disease, stage 3 (moderate): Secondary | ICD-10-CM | POA: Diagnosis not present

## 2018-08-06 DIAGNOSIS — I82411 Acute embolism and thrombosis of right femoral vein: Secondary | ICD-10-CM | POA: Diagnosis not present

## 2018-08-06 DIAGNOSIS — K5909 Other constipation: Secondary | ICD-10-CM | POA: Diagnosis not present

## 2018-08-06 DIAGNOSIS — F0280 Dementia in other diseases classified elsewhere without behavioral disturbance: Secondary | ICD-10-CM | POA: Diagnosis not present

## 2018-08-06 DIAGNOSIS — I4891 Unspecified atrial fibrillation: Secondary | ICD-10-CM | POA: Diagnosis not present

## 2018-08-10 DIAGNOSIS — I130 Hypertensive heart and chronic kidney disease with heart failure and stage 1 through stage 4 chronic kidney disease, or unspecified chronic kidney disease: Secondary | ICD-10-CM | POA: Diagnosis not present

## 2018-08-10 DIAGNOSIS — E039 Hypothyroidism, unspecified: Secondary | ICD-10-CM | POA: Diagnosis not present

## 2018-08-10 DIAGNOSIS — F0280 Dementia in other diseases classified elsewhere without behavioral disturbance: Secondary | ICD-10-CM | POA: Diagnosis not present

## 2018-08-10 DIAGNOSIS — I4891 Unspecified atrial fibrillation: Secondary | ICD-10-CM | POA: Diagnosis not present

## 2018-08-10 DIAGNOSIS — N183 Chronic kidney disease, stage 3 (moderate): Secondary | ICD-10-CM | POA: Diagnosis not present

## 2018-08-10 DIAGNOSIS — K5909 Other constipation: Secondary | ICD-10-CM | POA: Diagnosis not present

## 2018-08-10 DIAGNOSIS — F329 Major depressive disorder, single episode, unspecified: Secondary | ICD-10-CM | POA: Diagnosis not present

## 2018-08-10 DIAGNOSIS — I82411 Acute embolism and thrombosis of right femoral vein: Secondary | ICD-10-CM | POA: Diagnosis not present

## 2018-08-10 DIAGNOSIS — I5032 Chronic diastolic (congestive) heart failure: Secondary | ICD-10-CM | POA: Diagnosis not present

## 2018-08-13 ENCOUNTER — Other Ambulatory Visit: Payer: Self-pay | Admitting: *Deleted

## 2018-08-13 DIAGNOSIS — F325 Major depressive disorder, single episode, in full remission: Secondary | ICD-10-CM | POA: Diagnosis not present

## 2018-08-13 DIAGNOSIS — N183 Chronic kidney disease, stage 3 (moderate): Secondary | ICD-10-CM | POA: Diagnosis not present

## 2018-08-13 DIAGNOSIS — I10 Essential (primary) hypertension: Secondary | ICD-10-CM | POA: Diagnosis not present

## 2018-08-13 DIAGNOSIS — I5032 Chronic diastolic (congestive) heart failure: Secondary | ICD-10-CM | POA: Diagnosis not present

## 2018-08-13 DIAGNOSIS — I82511 Chronic embolism and thrombosis of right femoral vein: Secondary | ICD-10-CM | POA: Diagnosis not present

## 2018-08-13 NOTE — Patient Outreach (Addendum)
[facility]) Care Management  08/13/2018  Nicole Bishop 1931-04-16 [id number]   Initial telephone assessment  Referral received 12/18 Referral source : [facility] telephonic case manager  Referral reason : in home assessment/evaluation of needs and management of chronic conditions    Chart reviewed for PMH; that includes , Atrial fib,DVT, CKD, Hypertension, HOH, sleep apnea, chronic diastolic heart failure class 3.   Outreach call to patient no answer, phone rang > 10 times unable to leave a message. Place call to mobile number, person answered identified as patient daughter Cloyd Stagers Day, she verified patient HIPAA identifies x 2 , states she takes patient calls and is listed as patient emergency contact. Daughter discussed patient is at home getting ready for a doctors appointment later today, it takes her a long while. Daughter questioned I got referral to call her mother.  Attempted to explain [facility] care management services, vs home health and personal care services. Daughter states patient has mild dementia and she doesn't want her crowded with so much stuff. Daughter request return call to patient home in the next week, to discuss with patient while she will be present.    Plan  Will send unsuccessful outreach letter  Will schedule return call in the next 4 business days, to patient regarding [facility] care management services.    Joylene Draft, RN, [place] Management Coordinator  [phone]- Mobile [phone]- Toll Free Main Office

## 2018-08-17 DIAGNOSIS — I82411 Acute embolism and thrombosis of right femoral vein: Secondary | ICD-10-CM | POA: Diagnosis not present

## 2018-08-17 DIAGNOSIS — F0280 Dementia in other diseases classified elsewhere without behavioral disturbance: Secondary | ICD-10-CM | POA: Diagnosis not present

## 2018-08-17 DIAGNOSIS — I130 Hypertensive heart and chronic kidney disease with heart failure and stage 1 through stage 4 chronic kidney disease, or unspecified chronic kidney disease: Secondary | ICD-10-CM | POA: Diagnosis not present

## 2018-08-17 DIAGNOSIS — N183 Chronic kidney disease, stage 3 (moderate): Secondary | ICD-10-CM | POA: Diagnosis not present

## 2018-08-17 DIAGNOSIS — K5909 Other constipation: Secondary | ICD-10-CM | POA: Diagnosis not present

## 2018-08-17 DIAGNOSIS — F329 Major depressive disorder, single episode, unspecified: Secondary | ICD-10-CM | POA: Diagnosis not present

## 2018-08-17 DIAGNOSIS — E039 Hypothyroidism, unspecified: Secondary | ICD-10-CM | POA: Diagnosis not present

## 2018-08-17 DIAGNOSIS — I5032 Chronic diastolic (congestive) heart failure: Secondary | ICD-10-CM | POA: Diagnosis not present

## 2018-08-17 DIAGNOSIS — I4891 Unspecified atrial fibrillation: Secondary | ICD-10-CM | POA: Diagnosis not present

## 2018-08-18 ENCOUNTER — Other Ambulatory Visit: Payer: Self-pay | Admitting: *Deleted

## 2018-08-18 ENCOUNTER — Ambulatory Visit
Admission: RE | Admit: 2018-08-18 | Discharge: 2018-08-18 | Disposition: A | Discharge status: Home / Self Care | Payer: Medicare HMO | Source: Ambulatory Visit | Attending: Internal Medicine | Admitting: Internal Medicine

## 2018-08-18 ENCOUNTER — Other Ambulatory Visit: Payer: Self-pay | Admitting: Internal Medicine

## 2018-08-18 DIAGNOSIS — R928 Other abnormal and inconclusive findings on diagnostic imaging of breast: Secondary | ICD-10-CM | POA: Diagnosis not present

## 2018-08-18 DIAGNOSIS — N6324 Unspecified lump in the left breast, lower inner quadrant: Secondary | ICD-10-CM | POA: Diagnosis not present

## 2018-08-18 DIAGNOSIS — N6323 Unspecified lump in the left breast, lower outer quadrant: Secondary | ICD-10-CM | POA: Diagnosis not present

## 2018-08-18 NOTE — Patient Outreach (Signed)
[facility]) Care Management  08/18/2018  Nicole Bishop 03-09-1931 [id number]   Telephone call attempt    Unsuccessful telephone outreach to patient home and mobile numbers listed , no answer to mobile number, able to leave a HIPAA compliant message on home number.    Plan  Will await return call , if no response will plan return call in the next 4 business days.    Joylene Draft, RN, [place] Management Coordinator  [phone]- Mobile [phone]- Toll Free Main Office

## 2018-08-20 ENCOUNTER — Other Ambulatory Visit: Payer: Self-pay | Admitting: Internal Medicine

## 2018-08-20 DIAGNOSIS — N6320 Unspecified lump in the left breast, unspecified quadrant: Secondary | ICD-10-CM

## 2018-08-21 ENCOUNTER — Other Ambulatory Visit: Payer: Self-pay | Admitting: *Deleted

## 2018-08-21 NOTE — Patient Outreach (Signed)
[facility]) Care Management  08/21/2018  Nicole Bishop 1931/04/12 [id number]   Telephone assessment   Outreach call to patient daughter Nicole Bishop as requested, HIPPA verified x 2 identifiers . Daughter again discussed concern regarding many people involved in patient care in calling patient, she doesn't hear well.  Daughter discussed that she and her brother accompany patient to all of her appointments.   Daughter inquired about how [facility] received information to contact her mother.Discussed with daughter patient followed by [facility] and their services will be completing soon and they placed referral based on assessment that patient will benefit from ongoing assistance with chronic medical conditions  and our [facility] care management involvement .  Discussed that patient has spoken with telephonic care manager and agreed to services, reviewed [facility] care management services.  Daughter discussed patient is in the process of getting personal care worker she has been approved through [employer]  , they have chosen 2 agencies and waiting to hear from them.  Patient daughter is agreeable to follow up and states patient will benefit from education on high blood pressure. Daughter requested that I not call patient at this time , she will relay information to her, as she states patient would call her anyway asking what this was about.  Patient daughter states that she or brother are not health care POA and they plan to work on this with patient .   Plan Will plan initial home visit in the next week for further assessment of care care needs.and care planning and goal setting.   Will send PCP involvement barrier letter.    Joylene Draft, RN, [place] Management Coordinator  [phone]- Mobile [phone]- Toll Free Main Office

## 2018-08-27 ENCOUNTER — Other Ambulatory Visit: Payer: Self-pay | Admitting: *Deleted

## 2018-08-27 ENCOUNTER — Encounter: Payer: Self-pay | Admitting: *Deleted

## 2018-08-27 NOTE — Patient Outreach (Signed)
[facility]) Care Management   08/27/2018  Nicole Bishop 10/25/1930 [id number]  Nicole Bishop is an 85 y.o. female  Subjective:  Feeling okay except for pain in right arm states she has bursitis patient reports resting arm and limiting lifting and pulling with arm helps.   Patient reports she continues to work on exercises taught by home health therapy.    Objective:  BP (!) 148/90 (BP Location: Left Arm, Patient Position: Sitting, Cuff Size: Normal)   Pulse 60   Resp 18   Ht 1.854 m (6\' 1" )   SpO2 97%   BMI 36.02 kg/m  Review of Systems  Constitutional: Negative.   HENT: Negative.   Eyes: Negative.   Respiratory: Negative.   Cardiovascular: Positive for leg swelling.       Right leg greater than left   Gastrointestinal: Negative.   Genitourinary: Negative.   Musculoskeletal: Positive for joint pain.  Skin: Negative.   Neurological: Negative.   Endo/Heme/Allergies: Negative.   Psychiatric/Behavioral: Negative.     Physical Exam  Constitutional: She is oriented to person, place, and time. She appears well-developed and well-nourished.  Cardiovascular: Normal rate and normal heart sounds.  Respiratory: Effort normal and breath sounds normal.  GI: Soft.  Neurological: She is alert and oriented to person, place, and time.  Skin: Skin is warm and dry.  Psychiatric: She has a normal mood and affect. Her behavior is normal. Judgment and thought content normal.    Encounter Medications:   Outpatient Encounter Medications as of 08/27/2018  Medication Sig Note  . acetaminophen (TYLENOL) 650 MG CR tablet Take 650 mg by mouth every 8 (eight) hours as needed for pain.   Marland Kitchen amLODipine (NORVASC) 5 MG tablet Take 5 mg by mouth daily.   . calcitRIOL (ROCALTROL) 0.25 MCG capsule Take 1 capsule by mouth daily.   . Cholecalciferol (VITAMIN D3) 2000 units capsule Take 4,000 Units by mouth daily.   . CYANOCOBALAMIN PO Take 1,000 mcg by mouth daily.   Marland Kitchen docusate sodium  (COLACE) 100 MG capsule Take 100 mg by mouth 2 (two) times daily.    Marland Kitchen esomeprazole (NEXIUM) 40 MG capsule Take 40 mg by mouth daily as needed (acid reflux).    . Ferrous Sulfate (SLOW FE) 142 (45 Fe) MG TBCR Take 1 tablet by mouth daily.   Marland Kitchen lactulose (CONSTULOSE) 10 GM/15ML solution Take 10 g by mouth daily as needed for mild constipation.    Marland Kitchen loratadine (CLARITIN) 10 MG tablet Take 1 tablet (10 mg total) by mouth daily.   . metoprolol succinate (TOPROL-XL) 50 MG 24 hr tablet Take 1 tablet (50 mg total) by mouth daily. Take with or immediately following a meal.   . mometasone (NASONEX) 50 MCG/ACT nasal spray Place 2 sprays into the nose daily as needed (allergies).    Marland Kitchen olopatadine (PATANOL) 0.1 % ophthalmic solution Place 1 drop into both eyes 2 (two) times daily as needed for allergies.    . polyethylene glycol (MIRALAX / GLYCOLAX) packet Take 17 g by mouth daily as needed for mild constipation or moderate constipation.   . Rivaroxaban (XARELTO) 15 MG TABS tablet Take 1 tablet (15 mg total) by mouth 2 (two) times daily with a meal for 17 days. Through 06/29/2018 then 15 mg daily (Patient not taking: Reported on 06/19/2018)   . rivaroxaban (XARELTO) 20 MG TABS tablet Take 1 tablet (20 mg total) by mouth daily with supper. 06/19/2018: Per son, discharge instructions were to take 20  mg daily until Nov 6 then decrease to 15 mg daily.   Facility-Administered Encounter Medications as of 08/27/2018  Medication  . 0.9 %  sodium chloride infusion    Functional Status:   In your present state of health, do you have any difficulty performing the following activities: 06/09/2018  Hearing? Y  Vision? N  Difficulty concentrating or making decisions? N  Walking or climbing stairs? N  Dressing or bathing? N  Doing errands, shopping? Y  Some recent data might be hidden    Fall/Depression Screening:    Fall Risk  05/30/2017 05/25/2015  Falls in the past year? Yes No  Number falls in past yr: 1 -   Injury with Fall? No -  Risk for fall due to : - Impaired mobility  Follow up Education provided -   [facility] 2/9 Scores 08/05/2018 05/25/2015  PHQ - 2 Score 0 1    Assessment:  Initial home visit . Patient at home alone, placed call to her daughter [name] during visit.    Hypertension  Taking medications as prescribed her daughter fills pill Environmental education officer.  Patient working on limiting salt in diet, daughter making low salt choices in foods bought.  Patient daughter concerned regarding home blood pressure monitor not working properly tested during visit , error code on monitor communicated with daughter that discussed possibly using  [employer] catalog to order new monitor  Noted patient with lower leg edema , right leg greater than left,daughter reports this usually gets worse when patient does not wear support hose, support hose not on today, patient has some difficulty with placing hose on due to shoulder discomfort, family helps a times.  Offered to place stocking on during visit, patient declined stating it will be time to take them off this evening.  Social Patient lives in senior apartment . Supportive family that calls and visits frequently during the week.  Discharge date of [facility] home health will be 1/15. Patient will begin on 1/13 personal care services with agency called [facility], patient will receive services Monday -Friday 10 am to 1 pm, and every other weekend 10 am - 12:30.  Fall risk High fall risk  Patient has walker for use in home, reminded during visit to use walker Reinforced fall preventions measures.  Advanced Directive Patient does not have directive in place,    Plan:  [facility] welcome packet reviewed , Consent signed  Provided Blood pressure control book [facility]N low and high salt handout Advised regarding fall prevention measures .  Advanced directive packet, explained patient and daughter and resources in area for completing and notarizing.  Will plan return call  to patient daughter in the next month Will send PCP initial visit note.    THN CM Care Plan Problem One     Most Recent Value  Care Plan Problem One  Knowledge related to chronic medical conditions , hypertension, recent right  leg DVT    Role Documenting the Problem One  Care Management [place] for Problem One  Active  [facility] Long Term Goal   Patient will be able to verbalize at least 2 self care management measures of hypertension over the next 30 days   THN Long Term Goal Start Date  08/27/18  Interventions for Problem One Long Term Goal  Home visit completed , Discussed with daughter blood pressure control book in home and review of normal blood pressure readings   THN CM Short Term Goal #1   Patient will  be able to report monitoring blood pressure at least twice weekly over the next 30 days   THN CM Short Term Goal #1 Start Date  08/27/18  Interventions for Short Term Goal #1  Review of patient blood pressure monitor, commmunicated with daughter error on monitor , she plans to purchase new monitor . Marland Kitchen Discussed benefit of monitoring patient reading in the home and taking log to PCP visit .   THN CM Short Term Goal #2   Patient/daughter will be able to identify at least 3 high salt foods to limit in diet over the next 30 days    THN CM Short Term Goal #2 Start Date  08/27/18  Interventions for Short Term Goal #2  provided and reviewed THN high low salt handout with patient , focused on high salt foods to limit, discussed how increased salt in diet relates to elevated blood pressure.   THN CM Short Term Goal #3  Patient will be able to report decrease in swelling in lower legs over the next 30 days   THN CM Short Term Goal #3 Start Date  08/27/18  Interventions for Short Tern Goal #3  Reinforced elevating legs as much as possible while sitting in recliner chair during the day, encouraged wearing compression hose explained how that helps with reducing swelling.      Joylene Draft, RN, [place] Management Coordinator  [phone]- Mobile [phone]- Toll Free Main Office

## 2018-09-01 DIAGNOSIS — I5032 Chronic diastolic (congestive) heart failure: Secondary | ICD-10-CM | POA: Diagnosis not present

## 2018-09-01 DIAGNOSIS — N183 Chronic kidney disease, stage 3 (moderate): Secondary | ICD-10-CM | POA: Diagnosis not present

## 2018-09-01 DIAGNOSIS — F0390 Unspecified dementia without behavioral disturbance: Secondary | ICD-10-CM | POA: Diagnosis not present

## 2018-09-02 DIAGNOSIS — I5032 Chronic diastolic (congestive) heart failure: Secondary | ICD-10-CM | POA: Diagnosis not present

## 2018-09-02 DIAGNOSIS — F0390 Unspecified dementia without behavioral disturbance: Secondary | ICD-10-CM | POA: Diagnosis not present

## 2018-09-02 DIAGNOSIS — N183 Chronic kidney disease, stage 3 (moderate): Secondary | ICD-10-CM | POA: Diagnosis not present

## 2018-09-03 ENCOUNTER — Ambulatory Visit: Payer: Medicare HMO | Attending: Neurology

## 2018-09-03 DIAGNOSIS — F329 Major depressive disorder, single episode, unspecified: Secondary | ICD-10-CM | POA: Diagnosis not present

## 2018-09-03 DIAGNOSIS — K5909 Other constipation: Secondary | ICD-10-CM | POA: Diagnosis not present

## 2018-09-03 DIAGNOSIS — G4733 Obstructive sleep apnea (adult) (pediatric): Secondary | ICD-10-CM | POA: Insufficient documentation

## 2018-09-03 DIAGNOSIS — E039 Hypothyroidism, unspecified: Secondary | ICD-10-CM | POA: Diagnosis not present

## 2018-09-03 DIAGNOSIS — F0280 Dementia in other diseases classified elsewhere without behavioral disturbance: Secondary | ICD-10-CM | POA: Diagnosis not present

## 2018-09-03 DIAGNOSIS — N183 Chronic kidney disease, stage 3 (moderate): Secondary | ICD-10-CM | POA: Diagnosis not present

## 2018-09-03 DIAGNOSIS — I82411 Acute embolism and thrombosis of right femoral vein: Secondary | ICD-10-CM | POA: Diagnosis not present

## 2018-09-03 DIAGNOSIS — I4891 Unspecified atrial fibrillation: Secondary | ICD-10-CM | POA: Diagnosis not present

## 2018-09-03 DIAGNOSIS — I130 Hypertensive heart and chronic kidney disease with heart failure and stage 1 through stage 4 chronic kidney disease, or unspecified chronic kidney disease: Secondary | ICD-10-CM | POA: Diagnosis not present

## 2018-09-03 DIAGNOSIS — I5032 Chronic diastolic (congestive) heart failure: Secondary | ICD-10-CM | POA: Diagnosis not present

## 2018-09-17 DIAGNOSIS — I10 Essential (primary) hypertension: Secondary | ICD-10-CM | POA: Diagnosis not present

## 2018-09-17 DIAGNOSIS — N183 Chronic kidney disease, stage 3 (moderate): Secondary | ICD-10-CM | POA: Diagnosis not present

## 2018-09-17 DIAGNOSIS — R600 Localized edema: Secondary | ICD-10-CM | POA: Diagnosis not present

## 2018-09-17 DIAGNOSIS — N2581 Secondary hyperparathyroidism of renal origin: Secondary | ICD-10-CM | POA: Diagnosis not present

## 2018-09-17 DIAGNOSIS — D631 Anemia in chronic kidney disease: Secondary | ICD-10-CM | POA: Diagnosis not present

## 2018-09-21 DIAGNOSIS — N183 Chronic kidney disease, stage 3 (moderate): Secondary | ICD-10-CM | POA: Diagnosis not present

## 2018-09-21 DIAGNOSIS — I10 Essential (primary) hypertension: Secondary | ICD-10-CM | POA: Diagnosis not present

## 2018-09-21 DIAGNOSIS — R600 Localized edema: Secondary | ICD-10-CM | POA: Diagnosis not present

## 2018-09-21 DIAGNOSIS — D631 Anemia in chronic kidney disease: Secondary | ICD-10-CM | POA: Diagnosis not present

## 2018-09-21 DIAGNOSIS — N2581 Secondary hyperparathyroidism of renal origin: Secondary | ICD-10-CM | POA: Diagnosis not present

## 2018-09-22 DIAGNOSIS — I5032 Chronic diastolic (congestive) heart failure: Secondary | ICD-10-CM | POA: Diagnosis not present

## 2018-09-22 DIAGNOSIS — I10 Essential (primary) hypertension: Secondary | ICD-10-CM | POA: Diagnosis not present

## 2018-09-22 DIAGNOSIS — I872 Venous insufficiency (chronic) (peripheral): Secondary | ICD-10-CM | POA: Diagnosis not present

## 2018-09-22 DIAGNOSIS — I4891 Unspecified atrial fibrillation: Secondary | ICD-10-CM | POA: Diagnosis not present

## 2018-09-22 DIAGNOSIS — E782 Mixed hyperlipidemia: Secondary | ICD-10-CM | POA: Diagnosis not present

## 2018-09-24 ENCOUNTER — Ambulatory Visit (INDEPENDENT_AMBULATORY_CARE_PROVIDER_SITE_OTHER): Payer: Medicare HMO | Admitting: Vascular Surgery

## 2018-09-24 ENCOUNTER — Encounter (INDEPENDENT_AMBULATORY_CARE_PROVIDER_SITE_OTHER): Payer: Medicare HMO

## 2018-10-01 ENCOUNTER — Other Ambulatory Visit: Payer: Self-pay | Admitting: *Deleted

## 2018-10-01 NOTE — Patient Outreach (Signed)
[facility]) Care Management  10/01/2018  Nicole Bishop April 12, 1931 [id number]   Telephone follow up call    Referral received 12/18 Referral source : [place] telephonic case manager  Referral reason : in home assessment/evaluation of needs and management of chronic conditions    Chart reviewed for PMH; that includes , Atrial fib,DVT, CKD, Hypertension, HOH, sleep apnea, chronic diastolic heart failure class 3  Successful telephone outreach to patient daughter , Nicole Bishop as patient requested for additional outreach.   Daughter discussed patient . Recent sleep study results that she will be ordered a CPAP, her son is following up on appointment .   She further discussed :  Hypertension Discussed recent blood pressure reading and cardiology visit no changes to medications. Daughter discussed recent  blood pressure reading of 140/80's.  Patient continues to ask for salty snacks, and she tries to limit what she brings patient .  reinforced how salt effects blood pressure. Will provide additional education on low salt and controlling hypertension education .  Lower leg swelling  Patient continues with swelling in legs, patient not able to put compression hose on herself. Daughter reports she places hose on when she visits patient and reinforces keeping legs elevated  during the day .  Social Daughter discussed  that it did not work out with the personal care provider assigned to patient, and plans to reassign another worker states that was about 2 weeks ago. Encouraged daughter to follow up with agency regarding personal care service, discussed how this will benefit patient .   Plan  Will plan return call in the next month.  Will send EMMI on DASH diet Will send EMMI on controlling hypertension.  THN CM Care Plan Problem One     Most Recent Value  Care Plan Problem One  Knowledge related to chronic medical conditions , hypertension, recent right  leg DVT    Role  Documenting the Problem One  Care Management [place] for Problem One  Active  [facility] Long Term Goal   Patient will be able to verbalize at least 2 self care management measures of hypertension over the next 90 days   THN Long Term Goal Start Date  08/27/18  Interventions for Problem One Long Term Goal  Discussed patient current clinical state with daughter , encouraged follow up on CPAP from sleep study results to help managing medical conditons.   THN CM Short Term Goal #1   Patient will be able to report monitoring blood pressure at least twice weekly over the next 30 days   THN CM Short Term Goal #1 Start Date  10/01/18 [name] restart ]  Interventions for Short Term Goal #1  Discussed recent blood pressure readings and having appropriate equipment in home to monitor, reinforced keeping a record.   THN CM Short Term Goal #2   Patient/daughter will be able to identify at least 3 high salt foods to limit in diet over the next 30 days    THN CM Short Term Goal #2 Start Date  08/27/18 [name] restarted ]  Interventions for Short Term Goal #2  Discussed with daughter how salt effects blood pressure reading , causing body to hold on to extra fluid and how this raises blood pressure, discussed snack items with lesser salt , and encouraged not adding salt to food. Will send EMMI low salt food choices. and controlling hypertension encouraged reading for review prior to next visit .    THN CM Short  Term Goal #3  Patient will be able to report decrease in swelling in lower legs over the next 30 days   THN CM Short Term Goal #3 Start Date  10/01/18  Interventions for Short Tern Goal #3  Reviewed measures to help with decreasing swelling, reviewed how compression hose help with decreasing swelling, discussed follow up with personal care providers to help with daily care, elevation of legs while sitting in chair, limiting salt in diet ,       Joylene Draft, RN, [place]  Management Coordinator  [phone]- Mobile [phone]- [place]

## 2018-10-08 ENCOUNTER — Encounter (INDEPENDENT_AMBULATORY_CARE_PROVIDER_SITE_OTHER): Payer: Medicare HMO

## 2018-10-08 ENCOUNTER — Encounter (INDEPENDENT_AMBULATORY_CARE_PROVIDER_SITE_OTHER): Payer: Self-pay

## 2018-10-08 ENCOUNTER — Ambulatory Visit (INDEPENDENT_AMBULATORY_CARE_PROVIDER_SITE_OTHER): Payer: Medicare HMO | Admitting: Vascular Surgery

## 2018-10-14 DIAGNOSIS — M5441 Lumbago with sciatica, right side: Secondary | ICD-10-CM | POA: Diagnosis not present

## 2018-10-14 DIAGNOSIS — G4733 Obstructive sleep apnea (adult) (pediatric): Secondary | ICD-10-CM | POA: Diagnosis not present

## 2018-10-14 DIAGNOSIS — M5442 Lumbago with sciatica, left side: Secondary | ICD-10-CM | POA: Diagnosis not present

## 2018-10-14 DIAGNOSIS — R262 Difficulty in walking, not elsewhere classified: Secondary | ICD-10-CM | POA: Diagnosis not present

## 2018-10-14 DIAGNOSIS — G8929 Other chronic pain: Secondary | ICD-10-CM | POA: Diagnosis not present

## 2018-10-15 DIAGNOSIS — I5032 Chronic diastolic (congestive) heart failure: Secondary | ICD-10-CM | POA: Diagnosis not present

## 2018-10-15 DIAGNOSIS — F325 Major depressive disorder, single episode, in full remission: Secondary | ICD-10-CM | POA: Diagnosis not present

## 2018-10-15 DIAGNOSIS — N183 Chronic kidney disease, stage 3 (moderate): Secondary | ICD-10-CM | POA: Diagnosis not present

## 2018-10-15 DIAGNOSIS — H1013 Acute atopic conjunctivitis, bilateral: Secondary | ICD-10-CM | POA: Diagnosis not present

## 2018-10-15 DIAGNOSIS — I82511 Chronic embolism and thrombosis of right femoral vein: Secondary | ICD-10-CM | POA: Diagnosis not present

## 2018-11-02 ENCOUNTER — Other Ambulatory Visit: Payer: Self-pay

## 2018-11-02 ENCOUNTER — Ambulatory Visit (INDEPENDENT_AMBULATORY_CARE_PROVIDER_SITE_OTHER): Payer: Medicare HMO | Admitting: Vascular Surgery

## 2018-11-02 ENCOUNTER — Ambulatory Visit (INDEPENDENT_AMBULATORY_CARE_PROVIDER_SITE_OTHER): Payer: Medicare HMO

## 2018-11-02 ENCOUNTER — Encounter (INDEPENDENT_AMBULATORY_CARE_PROVIDER_SITE_OTHER): Payer: Self-pay | Admitting: Vascular Surgery

## 2018-11-02 VITALS — BP 187/93 | HR 65 | Resp 16 | Ht 72.0 in | Wt 266.0 lb

## 2018-11-02 DIAGNOSIS — Z7901 Long term (current) use of anticoagulants: Secondary | ICD-10-CM | POA: Diagnosis not present

## 2018-11-02 DIAGNOSIS — I4891 Unspecified atrial fibrillation: Secondary | ICD-10-CM | POA: Insufficient documentation

## 2018-11-02 DIAGNOSIS — I10 Essential (primary) hypertension: Secondary | ICD-10-CM

## 2018-11-02 DIAGNOSIS — I82511 Chronic embolism and thrombosis of right femoral vein: Secondary | ICD-10-CM

## 2018-11-02 DIAGNOSIS — I482 Chronic atrial fibrillation, unspecified: Secondary | ICD-10-CM | POA: Diagnosis not present

## 2018-11-02 DIAGNOSIS — Z79899 Other long term (current) drug therapy: Secondary | ICD-10-CM | POA: Diagnosis not present

## 2018-11-02 DIAGNOSIS — I872 Venous insufficiency (chronic) (peripheral): Secondary | ICD-10-CM

## 2018-11-02 DIAGNOSIS — I480 Paroxysmal atrial fibrillation: Secondary | ICD-10-CM | POA: Insufficient documentation

## 2018-11-02 NOTE — Progress Notes (Signed)
MRN : [id number]  Nicole Bishop is a 85 y.o. (06-08-1931) female who presents with chief complaint of  Chief Complaint  Patient presents with   Follow-up    ultrasound  .  History of Present Illness:   The patient presents to the office for evaluation of DVT.  DVT was identified at [facility] by Duplex ultrasound.  The initial symptoms were pain and swelling in the lower extremity.  The patient notes her leg is minimally painful, it does still swell some.  Symptoms are better with elevation.  The patient notes minimal edema in the morning which steadily worsens throughout the day.    The patient has not been using compression therapy on a routine basis at this point.  No SOB or pleuritic chest pains.  No cough or hemoptysis.  No blood per rectum or blood in any sputum.  No excessive bruising per the patient.   Current Meds  Medication Sig   acetaminophen (TYLENOL) 650 MG CR tablet Take 650 mg by mouth every 8 (eight) hours as needed for pain.   calcitRIOL (ROCALTROL) 0.25 MCG capsule Take 1 capsule by mouth daily.   Cholecalciferol (VITAMIN D3) 2000 units capsule Take 4,000 Units by mouth daily.   CYANOCOBALAMIN PO Take 1,000 mcg by mouth daily.   docusate sodium (COLACE) 100 MG capsule Take 100 mg by mouth 2 (two) times daily.    escitalopram (LEXAPRO) 10 MG tablet Take 10 mg by mouth daily.   esomeprazole (NEXIUM) 40 MG capsule Take 40 mg by mouth daily as needed (acid reflux).    Ferrous Sulfate (SLOW FE) 142 (45 Fe) MG TBCR Take 1 tablet by mouth daily.   lactulose (CONSTULOSE) 10 GM/15ML solution Take 10 g by mouth daily as needed for mild constipation.    loratadine (CLARITIN) 10 MG tablet Take 1 tablet (10 mg total) by mouth daily.   metoprolol succinate (TOPROL-XL) 50 MG 24 hr tablet Take 1 tablet (50 mg total) by mouth daily. Take with or immediately following a meal.   mometasone (NASONEX) 50 MCG/ACT nasal spray Place 2 sprays into the nose daily as needed  (allergies).    olopatadine (PATANOL) 0.1 % ophthalmic solution Place 1 drop into both eyes 2 (two) times daily as needed for allergies.    polyethylene glycol (MIRALAX / GLYCOLAX) packet Take 17 g by mouth daily as needed for mild constipation or moderate constipation.   rivaroxaban (XARELTO) 20 MG TABS tablet Take 1 tablet (20 mg total) by mouth daily with supper.    Past Medical History:  Diagnosis Date   Anemia    Anxiety    Arthritis    Chronic kidney disease    Coronary artery disease    Depression    Dizziness    Dyspnea    Dysrhythmia    gets tachy if anxious or excited   GERD (gastroesophageal reflux disease)    History of hiatal hernia    HOH (hard of hearing)    Hypertension    Hypothyroidism    IDA (iron deficiency anemia) 12/14/2014   Myocardial infarction [facility]) 1992   Renal insufficiency    Sleep apnea    Tremors of nervous system     Past Surgical History:  Procedure Laterality Date   CATARACT EXTRACTION W/PHACO Left 07/15/2017   Procedure: CATARACT EXTRACTION PHACO AND INTRAOCULAR LENS PLACEMENT (IOC)-LEFT;  Surgeon: Birder Robson, MD;  Location: [facility] ORS;  Service: Ophthalmology;  Laterality: Left;  [place] 00.36.1 AP% 13.9 CDE 5.02 Fluid  Pack lot # [id number]   CATARACT EXTRACTION W/PHACO Right 08/26/2017   Procedure: CATARACT EXTRACTION PHACO AND INTRAOCULAR LENS PLACEMENT (IOC);  Surgeon: Birder Robson, MD;  Location: [facility] ORS;  Service: Ophthalmology;  Laterality: Right;  [place] 00:41.0 AP% 15.3 CDE 6.27 FLUID PACK LOT # [id number]   CHOLECYSTECTOMY     COLONOSCOPY WITH PROPOFOL N/A 04/10/2015   Procedure: COLONOSCOPY WITH PROPOFOL;  Surgeon: Manya Silvas, MD;  Location: [facility] ENDOSCOPY;  Service: Endoscopy;  Laterality: N/A;   ESOPHAGOGASTRODUODENOSCOPY (EGD) WITH PROPOFOL N/A 04/10/2015   Procedure: ESOPHAGOGASTRODUODENOSCOPY (EGD) WITH PROPOFOL;  Surgeon: Manya Silvas, MD;  Location: [facility] ENDOSCOPY;  Service: Endoscopy;   Laterality: N/A;   fatty tumor removed on left shoulder     gallbladdedr removed     JOINT REPLACEMENT Bilateral    total knee replacements   REPLACEMENT TOTAL KNEE BILATERAL Bilateral     Social History Social History   Tobacco Use   Smoking status: Never Smoker   Smokeless tobacco: Never Used  Substance Use Topics   Alcohol use: No   Drug use: No    Family History Family History  Problem Relation Age of Onset   Arthritis Mother    Hypertension Mother    Stroke Mother    Hypertension Father    Heart attack Father    Breast cancer Neg Hx     Allergies  Allergen Reactions   Penicillins Rash and Other (See Comments)    Has patient had a PCN reaction causing immediate rash, facial/tongue/throat swelling, SOB or lightheadedness with hypotension: Unknown Has patient had a PCN reaction causing severe rash involving mucus membranes or skin necrosis: Unknown Has patient had a PCN reaction that required hospitalization: Unknown Has patient had a PCN reaction occurring within the last 10 years: Unknown If all of the above answers are "NO", then may proceed with Cephalosporin use.    Nsaids Other (See Comments)    GI upset   Atorvastatin Rash   Celebrex [Celecoxib] Other (See Comments)    Constipation and stomach upset   Felodipine Nausea Only   Oxaprozin Nausea Only   Rofecoxib Other (See Comments)    GI upset   Simvastatin Rash     REVIEW OF SYSTEMS (Negative unless checked)  Constitutional: [] Weight loss  [] Fever  [] Chills Cardiac: [] Chest pain   [] Chest pressure   [] Palpitations   [] Shortness of breath when laying flat   [] Shortness of breath with exertion. Vascular:  [] Pain in legs with walking   [x] Pain in legs with standing  [x] History of DVT   [] Phlebitis   [x] Swelling in legs   [] Varicose veins   [] Non-healing ulcers Pulmonary:   [] Uses home oxygen   [] Productive cough   [] Hemoptysis   [] Wheeze  [] COPD   [] Asthma Neurologic:  [] Dizziness    [] Seizures   [] History of stroke   [] History of TIA  [] Aphasia   [] Vissual changes   [] Weakness or numbness in arm   [] Weakness or numbness in leg Musculoskeletal:   [] Joint swelling   [] Joint pain   [] Low back pain Hematologic:  [] Easy bruising  [] Easy bleeding   [] Hypercoagulable state   [] Anemic Gastrointestinal:  [] Diarrhea   [] Vomiting  [] Gastroesophageal reflux/heartburn   [] Difficulty swallowing. Genitourinary:  [] Chronic kidney disease   [] Difficult urination  [] Frequent urination   [] Blood in urine Skin:  [] Rashes   [] Ulcers  Psychological:  [] History of anxiety   []  History of major depression.  Physical Examination  Vitals:   11/02/18 1446  BP: Marland Kitchen)  187/93  Pulse: 65  Resp: 16  Weight: 266 lb (120.7 kg)  Height: 6' (1.829 m)   Body mass index is 36.08 kg/m. Gen: WD/WN, Seen in a sitting walker Head: [place]/AT, No temporalis wasting.  Ear/Nose/Throat: Hearing grossly intact, nares w/o erythema or drainage Eyes: PER, EOMI, sclera nonicteric.  Neck: Supple, no large masses.   Pulmonary:  Good air movement, no audible wheezing bilaterally, no use of accessory muscles.  Cardiac: RRR, no JVD Vascular: scattered varicosities present bilaterally.  Mild venous stasis changes to the legs bilaterally.  2+ soft pitting edema bilateally Vessel Right Left  Radial Palpable Palpable  PT Palpable Palpable  DP Palpable Palpable  Gastrointestinal: Non-distended. No guarding/no peritoneal signs.  Musculoskeletal: M/S 5/5 throughout.  No deformity or atrophy.  Neurologic: CN 2-12 intact. Symmetrical.  Speech is fluent. Motor exam as listed above. Psychiatric: Judgment intact, Mood & affect appropriate for pt's clinical situation. Dermatologic: mild venous rashes no ulcers noted.  No changes consistent with cellulitis. Lymph : No lichenification or skin changes of chronic lymphedema.  CBC Lab Results  Component Value Date   WBC 4.0 06/09/2018   HGB 10.0 (L) 06/09/2018   HCT 32.5 (L)  06/09/2018   MCV 88.3 06/09/2018   PLT 196 06/09/2018    BMET    Component Value Date/Time   NA 142 06/09/2018 0616   NA 135 11/21/2014 1301   K 3.7 06/09/2018 0616   K 4.1 11/21/2014 1301   CL 112 (H) 06/09/2018 0616   CL 105 11/21/2014 1301   CO2 24 06/09/2018 0616   CO2 23 11/21/2014 1301   GLUCOSE 94 06/09/2018 0616   GLUCOSE 102 (H) 11/21/2014 1301   BUN 18 06/09/2018 0616   BUN 15 11/21/2014 1301   CREATININE 0.94 06/09/2018 0616   CREATININE 1.08 (H) 11/21/2014 1301   CALCIUM 9.1 06/09/2018 0616   CALCIUM 9.0 11/21/2014 1301   GFRNONAA 54 (L) 06/09/2018 0616   GFRNONAA 48 (L) 11/21/2014 1301   GFRAA >60 06/09/2018 0616   GFRAA 56 (L) 11/21/2014 1301   CrCl cannot be calculated (Patient's most recent lab result is older than the maximum 21 days allowed.).  COAG Lab Results  Component Value Date   INR 1.20 06/09/2018   INR 1.1 11/21/2014    Radiology No results found.    Assessment/Plan 1. Chronic deep vein thrombosis (DVT) of femoral vein of right lower extremity (HCC) Recommend:   No surgery or intervention at this point in time.  IVC filter is not indicated at present.  The patient is on anticoagulation for life secondary to atrial fib  Elevation was stressed, use of a recliner was discussed.  I have had a long discussion with the patient regarding DVT and post phlebitic changes such as swelling and why it  causes symptoms such as pain.  The patient will wear graduated compression stockings class 1 (20-30 mmHg), beginning after three full days of anticoagulation, on a daily basis a prescription was given. The patient will  beginning wearing the stockings first thing in the morning and removing them in the evening. The patient is instructed specifically not to sleep in the stockings.  In addition, behavioral modification including elevation during the day and avoidance of prolonged dependency will be initiated.    The patient will continue  anticoagulation for now as there have not been any problems or complications at this point.    2. Chronic venous insufficiency No surgery or intervention at this point in time.  I have had a long discussion with the patient regarding venous insufficiency and why it  causes symptoms. I have discussed with the patient the chronic skin changes that accompany venous insufficiency and the long term sequela such as infection and ulceration.  Patient will begin wearing graduated compression stockings class 1 (20-30 mmHg) or compression wraps on a daily basis a prescription was given. The patient will put the stockings on first thing in the morning and removing them in the evening. The patient is instructed specifically not to sleep in the stockings.    In addition, behavioral modification including several periods of elevation of the lower extremities during the day will be continued. I have demonstrated that proper elevation is a position with the ankles at heart level.  The patient is instructed to begin routine exercise, especially walking on a daily basis   3. Essential (primary) hypertension Continue antihypertensive medications as already ordered, these medications have been reviewed and there are no changes at this time.   4. Chronic atrial fibrillation Continue antiarrhythmia medications as already ordered, these medications have been reviewed and there are no changes at this time.  Continue anticoagulation as ordered by Cardiology Service    Hortencia Pilar, MD  11/02/2018 2:56 PM

## 2018-11-03 ENCOUNTER — Other Ambulatory Visit: Payer: Self-pay | Admitting: *Deleted

## 2018-11-03 NOTE — Patient Outreach (Signed)
[facility]) Care Management  11/03/2018  Nicole Bishop [birth year] [id number]   Referral received 12/18 Referral source : [facility] telephonic case manager  Referral reason : in home assessment/evaluation of needs and management of chronic conditions    Chart reviewed for PMH; that includes , Atrial fib,DVT, CKD, Hypertension, HOH, sleep apnea, chronic diastolic heart failure class 3.  Unsuccessful outreach call to patient daughter [name], as preferred patient contact, no answer able to leave a HIPAA compliant message for return call.    Plan Will plan 2nd call in the next 4 business days for active [facility] patient,  if no return response.    Joylene Draft, RN, [place] Management Coordinator  [phone]- Mobile [phone]- Toll Free Main Office

## 2018-11-06 ENCOUNTER — Other Ambulatory Visit: Payer: Self-pay

## 2018-11-06 ENCOUNTER — Other Ambulatory Visit: Payer: Self-pay | Admitting: *Deleted

## 2018-11-06 NOTE — Patient Outreach (Addendum)
[facility] ) Care Management  11/06/2018  Nicole Bishop [birth year] [id number]  Referral received 12/18 Referral source : [facility] telephonic case manager  Referral reason : in home assessment/evaluation of needs and management of chronic conditions    Chart reviewed for PMH; that includes , Atrial fib,DVT, CKD, Hypertension, HOH, sleep apnea, chronic diastolic heart failure class 3.  Outreach call to patient daughter [name], as preferred patient contact, she reports that she has so much going on now and request return call on Monday.    Plan Will plan return call in the next business day. Will send unsuccessful outreach letter to active [facility] patient.  Joylene Draft, RN, [place] Management Coordinator  [phone]- Mobile [phone]- Toll Free Main Office

## 2018-11-09 ENCOUNTER — Other Ambulatory Visit: Payer: Self-pay

## 2018-11-09 ENCOUNTER — Other Ambulatory Visit: Payer: Self-pay | Admitting: *Deleted

## 2018-11-09 NOTE — Patient Outreach (Addendum)
[facility]) Care Management  11/09/2018  Nicole Bishop 12-01-1930 [id number]   Telephone assessment   Referral received 12/18 Referral source : [facility] telephonic case manager  Referral reason : in home assessment/evaluation of needs and management of chronic conditions    Chart reviewed for PMH; that includes , Atrial fib,DVT, CKD, Hypertension, HOH, sleep apnea, chronic diastolic heart failure class 3  Returned call to patient daughter and preferred contact  [name] ,no answer able to leave a HIPAA compliant message for return call.  Received return call from patient daughter.  Daughter discussed patient recent visit to vein daughter and patient got a good report she discussed patient having swelling in lower legs but it is improved when she wears her compression hose. Patient continues to work on elevating her legs while sitting up in the chair.  Daughter discussed continuing to limit salt in food choices that she purchases for patient.    Hypertension Daughter discussed blood pressure monitor is not working, she plans to purchase on , discussed patient OTC benefit from [employer] , daughter discussed patient gets $300 per quarter. Daughter discussed having book in home to order from .  Discussed importance of keeping track of home reading along with limiting salt to help with management of high blood pressure . Will provide education and support on blood pressure normal reading range.  Diastolic Heart failure  Daughter helps patient with limiting salt in diet,reports patient does not weigh daily daughter discussed scales may be working , she plans purchase scales. Explained benefits of monitoring weights to help identify worsening symptoms of heart failure, weight gain of 3 pounds in a day , 5 in a week, worsening swelling and shortness of breath to notify MD for follow up .  Apnea  Patient has new CPAP but does not wear on a regular basis at home per daughter report  it causing dry mouth , patient son makes sure water added for moisture. Reviewed benefits of using CPAP to help with getting adequate rest to help with over all health and reduce risk of other medical conditions .  Social  Patient daughter discussed that it did not work , with the personal care worker as part of her medicaid benefit , that was assigned to her mother. Encouraged daughter to contact with agency regarding a reassignment . Discussed benefit of having resource in home to help with patient day to day care. Patient daughter reports having contact information and will contact, encouraged to call CM if concerns or assistance needed.   Plan Will plan follow up call do daughter as requested in the next month Will send EMMI education on taking  blood pressure  Will send Heart failure packet . Reinforced with daughter importance of management of chronic conditions, monitoring blood pressure, limiting salt, taking medications .  Care planning and goal setting reviewed/revised for management of chronic conditions.   THN CM Care Plan Problem One     Most Recent Value  Care Plan Problem One  Knowledge related to chronic medical conditions , hypertension, recent right  leg DVT    Role Documenting the Problem One  Care Management [place] for Problem One  Active  [facility] Long Term Goal   Patient will be able to verbalize at least 2 self care management measures of hypertension over the next 90 days   THN Long Term Goal Start Date  08/27/18  Interventions for Problem One Long Term Goal  REviewed current clinical conditons.  Advised regarding continiuing to keep take medications as prescibed. , reviewed measures for blood pressure manamgement , daily medication , limiting salt in diet .   THN CM Short Term Goal #1   Patient will be able to report monitoring blood pressure at least twice weekly over the next 30 days   THN CM Short Term Goal #1 Start Date  11/09/18 [facility] reset ]   Interventions for Short Term Goal #1  Advised regarding importance of monitoring readings at home to identify elevations, reviewed normal ranges per blood presure book,. Will send emmi on how to montior bp. Encouraged daughter regarding using [employer] OTC benefit for obtaining supplies   THN CM Short Term Goal #2   Patient/daughter will be able to identify at least 3 high salt foods to limit in diet over the next 30 days    THN CM Short Term Goal #2 Start Date  08/27/18 [name] restarted ]  [facility] CM Short Term Goal #2 Met Date  11/09/18 [late entry for goal ]  THN CM Short Term Goal #3  Patient will be able to report decrease in swelling in lower legs over the next 30 days   THN CM Short Term Goal #3 Start Date  10/01/18  [facility] CM Short Term Goal #3 Met Date  11/09/18    [facility] CM Care Plan Problem Two     Most Recent Value  Care Plan Problem Two  knowledge deficit related to self care management of heart failure   Role Documenting the Problem Two  Care Management Coordinator  Care Plan for Problem Two  Active  Interventions for Problem Two Long Term Goal   Discussed patieint/daughter understanding of heart failure and measures to contro or idenfity worsening symptoms .   THN Long Term Goal  Over the next 31 days patient/daughter will be able to identify 2 measures for managing chronic heart failure   THN Long Term Goal Start Date  11/09/18  [facility] CM Short Term Goal #1   Over the next 20 days patient will be able to report obtaining scales.   THN CM Short Term Goal #1 Start Date  11/09/18  Interventions for Short Term Goal #2   Advised daughter to assess if patient scale or in working order and if not explained how to use OTC benefit for scales   THN CM Short Term Goal #2   Patient daughter will be able to identify 2 symptoms of heart failure worsening  over the next 30 days   THN CM Short Term Goal #2 Start Date  11/09/18  Interventions for Short Term Goal #2  Explained worsening symptoms of heart failure  of weight gain of 3 pounds in a day , 5 in a week, increased swelling and shortness of breath and action plan to notify MD       Joylene Draft, RN, [place] Management Coordinator  [phone]- Mobile [phone]- [place]

## 2018-11-11 ENCOUNTER — Ambulatory Visit: Payer: Self-pay | Admitting: *Deleted

## 2018-11-12 DIAGNOSIS — R262 Difficulty in walking, not elsewhere classified: Secondary | ICD-10-CM | POA: Diagnosis not present

## 2018-11-12 DIAGNOSIS — M5441 Lumbago with sciatica, right side: Secondary | ICD-10-CM | POA: Diagnosis not present

## 2018-11-12 DIAGNOSIS — G8929 Other chronic pain: Secondary | ICD-10-CM | POA: Diagnosis not present

## 2018-11-12 DIAGNOSIS — M5442 Lumbago with sciatica, left side: Secondary | ICD-10-CM | POA: Diagnosis not present

## 2018-11-25 ENCOUNTER — Other Ambulatory Visit: Payer: Self-pay

## 2018-11-25 ENCOUNTER — Other Ambulatory Visit: Payer: Self-pay | Admitting: *Deleted

## 2018-11-25 NOTE — Patient Outreach (Signed)
[facility]) Care Management  11/25/2018  NIKETA TURNER Mar 31, 1931 [id number]   Telephone follow up call    Referral received 12/18 Referral source : [facility] telephonic case manager  Referral reason : in home assessment/evaluation of needs and management of chronic conditions    Chart reviewed for PMH; that includes , Atrial fib,DVT, CKD, Hypertension, HOH, sleep apnea, chronic diastolic heart failure class 3  Outreach call to patient daughter,she discussed patient doing good she just left her home , she reports that they are wearing mask and gloves when visiting her.  She reports patient tolerating mobility in home, having more energy lately as she was able to cook dinner.  She further discussed : Hypertension : Daughter has been unable to get blood pressure monitor for use at home . Patient continues to limit salt in diet  Heart failure; No increase in swelling,denies shortness of breath , daughter to follow up on getting scales for to use or checking for need of new batteries for scale. Reviewed worsening symptoms of heart failure to notify MD of sooner.   Discussed current covid situation reinforced good handwashing , social isolation to prevent spread, also reviewed symptoms to notify MD of .shortness of breath , cough , fever.   Daughter discussed patient spending the last week with her and she just returned to her own home this week. She discussed precautions taken.   Plan  Will review [facility] resources for obtaining patient a blood pressure monitor.  Will plan follow up call to patient daughter  within the next 3 weeks.     [facility] CM Care Plan Problem One     Most Recent Value  Care Plan Problem One  Knowledge related to chronic medical conditions , hypertension, recent right  leg DVT    Role Documenting the Problem One  Care Management [place] for Problem One  Active  [facility] Long Term Goal   Patient will be able to verbalize at least 2 self care  management measures of hypertension over the next 90 days   [facility] Long Term Goal Start Date  08/27/18  Interventions for Problem One Long Term Goal  Reinforced measures to help control hypertenison , taking medication, limiiting salt in foods reviewed food with high salt  hidden salt , processed foods, salty snack, nto adding salt to foods. Encouraged daugther regarding patient wearing cpap, and benefits of quality sleep and notify MD or cpap company of problem with fit of cpap.   [facility] CM Short Term Goal #1   Patient will be able to report monitoring blood pressure at least twice weekly over the next 30 days   [facility] CM Short Term Goal #1 Start Date  11/09/18 [facility] reset ]  Interventions for Short Term Goal #1  will check on thn resources for being able to get bp monitor   [facility] CM Short Term Goal #2   Patient/daughter will be able to identify at least 3 high salt foods to limit in diet over the next 30 days    [facility] CM Short Term Goal #2 Start Date  08/27/18 [name] restarted ]  [facility] CM Short Term Goal #2 Met Date  11/09/18 [late entry for goal ]  [facility] CM Short Term Goal #3  Patient will be able to report decrease in swelling in lower legs over the next 30 days   [facility] CM Short Term Goal #3 Start Date  10/01/18  [facility] CM Short Term Goal #3 Met Date  11/09/18    [facility] CM Care Plan Problem Two     Most Recent Value  Care Plan Problem Two  knowledge deficit related to self care management of heart failure   Role Documenting the Problem Two  Care Management Coordinator  Care Plan for Problem Two  Active  Interventions for Problem Two Long Term Goal   Reviewed current clinical state for symptoms of heart failure , advised importance of taking medications, monitoring weights, limiting salt in diet, notifying MD sooner for concerns to avoid hospital admission   [facility] Long Term Goal  Over the next 31 days patient/daughter will be able to identify 2 measures for managing chronic heart failure   [facility] Long Term Goal Start Date   11/09/18  [facility] CM Short Term Goal #1   Over the next 20 days patient will be able to report obtaining scales.   [facility] CM Short Term Goal #1 Start Date  11/09/18  Interventions for Short Term Goal #2   discussed ability to be able to check on batteries for the scales. to montior weights to identify sudden weight gain   [facility] CM Short Term Goal #2   Patient daughter will be able to identify 2 symptoms of heart failure worsening  over the next 30 days   [facility] CM Short Term Goal #2 Start Date  11/09/18  Interventions for Short Term Goal #2  Verified that patient has recieved education packet on heart failure and review of symptoms .       Joylene Draft, RN, [place] Management Coordinator  [phone]- Mobile [phone]- Toll Free Main Office

## 2018-12-10 ENCOUNTER — Other Ambulatory Visit: Payer: Self-pay | Admitting: *Deleted

## 2018-12-10 NOTE — Patient Outreach (Signed)
[facility]) Care Management  12/10/2018  Nicole Bishop Aug 26, 1930 [id number]   Telephone follow up call    Referral received 12/18 Referral source : [facility] telephonic case manager  Referral reason : in home assessment/evaluation of needs and management of chronic conditions    Chart reviewed for PMH; that includes , Atrial fib,DVT, CKD, Hypertension, HOH, sleep apnea, chronic diastolic heart failure class 3   Outreach call to patient daughter Nicole Bishop, ( patient daughter)  as requested for patient follow up due to patient having difficulty with hearing over the phone .Successful call to daughter, she reports patient is doing pretty good, having a little more energy able to cook some of her meals.   She further discussed for follow up   Hypertension  Patient daughter discussed checking patient blood pressure with higher reading on initial check, then when rechecked after patient resting in chair improved reading with diastolic less 90. Review of how to use blood pressure monitor.  Patient daughter does shopping and tries to provide food/snacks with lower salt and reinforce not adding salt to diet and reinforce with patient importance of limiting salt .  Heart failure  Daughter has not purchased batteries for scales , yet but plans to , she will identify type of batteries needed. Advised regarding self monitoring measures to identify worsening symptoms sooner, sudden weight gain, she denies patient having increase of shortness of breath or swelling she states patient would let her know if short of breath .  She reports that patient still does not wear her CPAP, states just can't get adjusted to it.   Plan Will plan follow up call in the next month for care needs related to chronic condition of heart failure and hypertension reinforce education and identify further care coordination needs and support prior to case closure as goals addressed.   [facility] CM Care Plan  Problem One     Most Recent Value  Care Plan Problem One  Knowledge related to chronic medical conditions , hypertension, recent right  leg DVT    Role Documenting the Problem One  Care Management [place] for Problem One  Active  [facility] Long Term Goal   Patient will be able to verbalize at least 2 self care management measures of hypertension over the next 90 days   [facility] Long Term Goal Start Date  08/27/18  [facility] Long Term Goal Met Date  12/10/18  [facility] CM Short Term Goal #1   Patient will be able to report monitoring blood pressure at least twice weekly over the next 20 days   [facility] CM Short Term Goal #1 Start Date  12/10/18 [goal reset date again ]  Interventions for Short Term Goal #1  verified by has bp monitor reviewed with daughter how to use, and handout recieved. encouraged to keep a record and review usual ranges and notify MD of consistent elevations, explained importance of notifying MD referred to blood pressure ranges in book as 140/90 elevation.    [facility] CM Short Term Goal #2   Patient/daughter will be able to identify at least 3 high salt foods to limit in diet over the next 30 days    [facility] CM Short Term Goal #2 Start Date  08/27/18 [name] restarted ]  [facility] CM Short Term Goal #2 Met Date  11/09/18 [late entry for goal ]  [facility] CM Short Term Goal #3  Patient will be able to report decrease in swelling in lower legs over the  next 30 days   [facility] CM Short Term Goal #3 Start Date  10/01/18  [facility] CM Short Term Goal #3 Met Date  11/09/18    [facility] CM Care Plan Problem Two     Most Recent Value  Care Plan Problem Two  knowledge deficit related to self care management of heart failure   Role Documenting the Problem Two  Care Management Coordinator  Care Plan for Problem Two  Active  Interventions for Problem Two Long Term Goal   Discussed with patient daughter patient clinical progression, reviewed for signs sympotms of worsening heart failure , sudden weight gain, of 2 pounds in a day 5 in a  week, sob, swelling increased, discussed lliving with heart failure handout and daily self care , taking medications,knowing how she feels,   [facility] Long Term Goal  Over the next 50 days patient/daughter will be able to identify 2 measures for managing chronic heart failure  [goal date adjusted due to goal progression ]  [facility] Long Term Goal Start Date  11/09/18  [facility] CM Short Term Goal #1   Over the next 20 days patient will be able to report obtaining scales.   [facility] CM Short Term Goal #1 Start Date  11/09/18  [facility] CM Short Term Goal #2   Patient daughter will be able to identify 2 symptoms of heart failure worsening  over the next 30 days   [facility] CM Short Term Goal #2 Start Date  11/09/18  [facility] CM Short Term Goal #2 Met Date  12/10/18      Joylene Draft, RN, [place] Management Coordinator  [phone]- Mobile [phone]- Toll Free Main Office

## 2018-12-13 DIAGNOSIS — M5441 Lumbago with sciatica, right side: Secondary | ICD-10-CM | POA: Diagnosis not present

## 2018-12-13 DIAGNOSIS — G8929 Other chronic pain: Secondary | ICD-10-CM | POA: Diagnosis not present

## 2018-12-13 DIAGNOSIS — M5442 Lumbago with sciatica, left side: Secondary | ICD-10-CM | POA: Diagnosis not present

## 2018-12-13 DIAGNOSIS — R262 Difficulty in walking, not elsewhere classified: Secondary | ICD-10-CM | POA: Diagnosis not present

## 2018-12-29 ENCOUNTER — Other Ambulatory Visit: Payer: Self-pay | Admitting: *Deleted

## 2018-12-29 NOTE — Patient Outreach (Signed)
[place] [facility]) Care Management  12/29/2018  Nicole Bishop 02/07/1931 [id number]   Telephone follow up call    Outreach call to patient contact her daughter [name] , no answer able to leave a HIPAA compliant message for return call.    Plan  Will plan return call in the next 4 business days.    Joylene Draft, RN, [place] Management Coordinator  [phone]- Mobile [phone]- Toll Free Main Office

## 2018-12-31 ENCOUNTER — Other Ambulatory Visit: Payer: Self-pay | Admitting: *Deleted

## 2018-12-31 NOTE — Patient Outreach (Addendum)
[place] [facility]) Care Management  12/31/2018  SHELL BLANCHETTE 08-14-1931 [id number]  Referral received 12/18 Referral source : [facility] telephonic case manager  Referral reason : in home assessment/evaluation of needs and management of chronic conditions    Chart reviewed for PMH; that includes , Atrial fib,DVT, CKD, Hypertension, HOH, sleep apnea, chronic diastolic heart failure class 3   Outreach call to patient daughter as requested patient contact, no answer unable to leave a voicemail message, phone continued to ring, no voicemail pick up.    Plan Will plan return call in the next 4 business days.  Will send unsuccessful outreach letter   Joylene Draft, RN, [place] Management Coordinator  [phone]- Mobile [phone]- Center

## 2019-01-06 ENCOUNTER — Other Ambulatory Visit: Payer: Self-pay | Admitting: *Deleted

## 2019-01-06 NOTE — Patient Outreach (Signed)
[facility]) Care Management  01/06/2019  Nicole Bishop Jan 09, 1931 [id number]   Telephone outreach call #3  Unsuccessful outreach call to patient contact person, [name], daughter. No answer unable to leave a message.    Plan Will await return call , if no response will plan case closure in 10 days .  Unsuccessful outreach letter has been sent .    Joylene Draft, RN, [place] Management Coordinator  [phone]- Mobile [phone]- Toll Free Main Office

## 2019-01-07 DIAGNOSIS — I5032 Chronic diastolic (congestive) heart failure: Secondary | ICD-10-CM | POA: Diagnosis not present

## 2019-01-08 DIAGNOSIS — H11823 Conjunctivochalasis, bilateral: Secondary | ICD-10-CM | POA: Diagnosis not present

## 2019-01-12 DIAGNOSIS — R262 Difficulty in walking, not elsewhere classified: Secondary | ICD-10-CM | POA: Diagnosis not present

## 2019-01-12 DIAGNOSIS — M5441 Lumbago with sciatica, right side: Secondary | ICD-10-CM | POA: Diagnosis not present

## 2019-01-12 DIAGNOSIS — G8929 Other chronic pain: Secondary | ICD-10-CM | POA: Diagnosis not present

## 2019-01-12 DIAGNOSIS — M5442 Lumbago with sciatica, left side: Secondary | ICD-10-CM | POA: Diagnosis not present

## 2019-01-13 ENCOUNTER — Other Ambulatory Visit: Payer: Self-pay | Admitting: *Deleted

## 2019-01-13 NOTE — Patient Outreach (Signed)
[name]  01/13/2019  Nicole Bishop [facility] May 04, 1931 [id number]   Unsuccessful telephone call attempts x 3, unsuccessful outreach letter sent, no response.   Plan Will plan case closure per workflow: unable to maintain contact.  Will send patient and PCP case closure letter.    Joylene Draft, RN, [place] Management Coordinator  [phone]- Mobile [phone]- Toll Free Main Office

## 2019-01-14 DIAGNOSIS — Z23 Encounter for immunization: Secondary | ICD-10-CM | POA: Diagnosis not present

## 2019-01-14 DIAGNOSIS — D649 Anemia, unspecified: Secondary | ICD-10-CM | POA: Diagnosis not present

## 2019-01-14 DIAGNOSIS — F325 Major depressive disorder, single episode, in full remission: Secondary | ICD-10-CM | POA: Diagnosis not present

## 2019-01-14 DIAGNOSIS — Z7689 Persons encountering health services in other specified circumstances: Secondary | ICD-10-CM | POA: Diagnosis not present

## 2019-01-14 DIAGNOSIS — F330 Major depressive disorder, recurrent, mild: Secondary | ICD-10-CM | POA: Diagnosis not present

## 2019-01-14 DIAGNOSIS — R441 Visual hallucinations: Secondary | ICD-10-CM | POA: Diagnosis not present

## 2019-01-14 DIAGNOSIS — Z0000 Encounter for general adult medical examination without abnormal findings: Secondary | ICD-10-CM | POA: Diagnosis not present

## 2019-01-14 DIAGNOSIS — I82511 Chronic embolism and thrombosis of right femoral vein: Secondary | ICD-10-CM | POA: Diagnosis not present

## 2019-01-14 DIAGNOSIS — K5909 Other constipation: Secondary | ICD-10-CM | POA: Diagnosis not present

## 2019-01-14 DIAGNOSIS — N183 Chronic kidney disease, stage 3 (moderate): Secondary | ICD-10-CM | POA: Diagnosis not present

## 2019-01-14 DIAGNOSIS — I5032 Chronic diastolic (congestive) heart failure: Secondary | ICD-10-CM | POA: Diagnosis not present

## 2019-01-14 DIAGNOSIS — I4891 Unspecified atrial fibrillation: Secondary | ICD-10-CM | POA: Diagnosis not present

## 2019-01-14 DIAGNOSIS — I10 Essential (primary) hypertension: Secondary | ICD-10-CM | POA: Diagnosis not present

## 2019-01-15 DIAGNOSIS — R600 Localized edema: Secondary | ICD-10-CM | POA: Diagnosis not present

## 2019-01-15 DIAGNOSIS — N2581 Secondary hyperparathyroidism of renal origin: Secondary | ICD-10-CM | POA: Diagnosis not present

## 2019-01-15 DIAGNOSIS — I10 Essential (primary) hypertension: Secondary | ICD-10-CM | POA: Diagnosis not present

## 2019-01-15 DIAGNOSIS — N183 Chronic kidney disease, stage 3 (moderate): Secondary | ICD-10-CM | POA: Diagnosis not present

## 2019-01-15 DIAGNOSIS — D631 Anemia in chronic kidney disease: Secondary | ICD-10-CM | POA: Diagnosis not present

## 2019-02-01 DIAGNOSIS — I82511 Chronic embolism and thrombosis of right femoral vein: Secondary | ICD-10-CM | POA: Diagnosis not present

## 2019-02-01 DIAGNOSIS — E782 Mixed hyperlipidemia: Secondary | ICD-10-CM | POA: Diagnosis not present

## 2019-02-01 DIAGNOSIS — M79675 Pain in left toe(s): Secondary | ICD-10-CM | POA: Diagnosis not present

## 2019-02-01 DIAGNOSIS — R0602 Shortness of breath: Secondary | ICD-10-CM | POA: Diagnosis not present

## 2019-02-01 DIAGNOSIS — I872 Venous insufficiency (chronic) (peripheral): Secondary | ICD-10-CM | POA: Diagnosis not present

## 2019-02-01 DIAGNOSIS — I10 Essential (primary) hypertension: Secondary | ICD-10-CM | POA: Diagnosis not present

## 2019-02-01 DIAGNOSIS — I890 Lymphedema, not elsewhere classified: Secondary | ICD-10-CM | POA: Diagnosis not present

## 2019-02-01 DIAGNOSIS — I4891 Unspecified atrial fibrillation: Secondary | ICD-10-CM | POA: Diagnosis not present

## 2019-02-01 DIAGNOSIS — B351 Tinea unguium: Secondary | ICD-10-CM | POA: Diagnosis not present

## 2019-02-01 DIAGNOSIS — M79674 Pain in right toe(s): Secondary | ICD-10-CM | POA: Diagnosis not present

## 2019-02-01 DIAGNOSIS — R42 Dizziness and giddiness: Secondary | ICD-10-CM | POA: Diagnosis not present

## 2019-02-01 DIAGNOSIS — I5032 Chronic diastolic (congestive) heart failure: Secondary | ICD-10-CM | POA: Diagnosis not present

## 2019-02-12 DIAGNOSIS — R262 Difficulty in walking, not elsewhere classified: Secondary | ICD-10-CM | POA: Diagnosis not present

## 2019-02-12 DIAGNOSIS — G8929 Other chronic pain: Secondary | ICD-10-CM | POA: Diagnosis not present

## 2019-02-12 DIAGNOSIS — M5441 Lumbago with sciatica, right side: Secondary | ICD-10-CM | POA: Diagnosis not present

## 2019-02-12 DIAGNOSIS — M5442 Lumbago with sciatica, left side: Secondary | ICD-10-CM | POA: Diagnosis not present

## 2019-02-17 ENCOUNTER — Other Ambulatory Visit: Payer: Medicare HMO

## 2019-03-02 ENCOUNTER — Ambulatory Visit
Admission: RE | Admit: 2019-03-02 | Discharge: 2019-03-02 | Disposition: A | Discharge status: Home / Self Care | Payer: Medicare HMO | Source: Ambulatory Visit | Attending: Internal Medicine | Admitting: Internal Medicine

## 2019-03-02 ENCOUNTER — Other Ambulatory Visit: Payer: Self-pay

## 2019-03-02 DIAGNOSIS — N6322 Unspecified lump in the left breast, upper inner quadrant: Secondary | ICD-10-CM | POA: Diagnosis not present

## 2019-03-02 DIAGNOSIS — N6320 Unspecified lump in the left breast, unspecified quadrant: Secondary | ICD-10-CM

## 2019-03-02 DIAGNOSIS — N6325 Unspecified lump in the left breast, overlapping quadrants: Secondary | ICD-10-CM | POA: Diagnosis not present

## 2019-03-02 DIAGNOSIS — R928 Other abnormal and inconclusive findings on diagnostic imaging of breast: Secondary | ICD-10-CM | POA: Diagnosis not present

## 2019-03-02 DIAGNOSIS — N6324 Unspecified lump in the left breast, lower inner quadrant: Secondary | ICD-10-CM | POA: Diagnosis not present

## 2019-03-04 ENCOUNTER — Other Ambulatory Visit: Payer: Self-pay | Admitting: Internal Medicine

## 2019-03-04 DIAGNOSIS — N6320 Unspecified lump in the left breast, unspecified quadrant: Secondary | ICD-10-CM

## 2019-04-15 DIAGNOSIS — F330 Major depressive disorder, recurrent, mild: Secondary | ICD-10-CM | POA: Diagnosis not present

## 2019-04-15 DIAGNOSIS — N183 Chronic kidney disease, stage 3 (moderate): Secondary | ICD-10-CM | POA: Diagnosis not present

## 2019-04-15 DIAGNOSIS — R441 Visual hallucinations: Secondary | ICD-10-CM | POA: Diagnosis not present

## 2019-04-15 DIAGNOSIS — I5032 Chronic diastolic (congestive) heart failure: Secondary | ICD-10-CM | POA: Diagnosis not present

## 2019-04-15 DIAGNOSIS — I10 Essential (primary) hypertension: Secondary | ICD-10-CM | POA: Diagnosis not present

## 2019-04-15 DIAGNOSIS — Z79899 Other long term (current) drug therapy: Secondary | ICD-10-CM | POA: Diagnosis not present

## 2019-04-15 DIAGNOSIS — K5909 Other constipation: Secondary | ICD-10-CM | POA: Diagnosis not present

## 2019-04-15 DIAGNOSIS — I4891 Unspecified atrial fibrillation: Secondary | ICD-10-CM | POA: Diagnosis not present

## 2019-04-15 DIAGNOSIS — I82511 Chronic embolism and thrombosis of right femoral vein: Secondary | ICD-10-CM | POA: Diagnosis not present

## 2019-04-15 DIAGNOSIS — Z7689 Persons encountering health services in other specified circumstances: Secondary | ICD-10-CM | POA: Diagnosis not present

## 2019-04-19 DIAGNOSIS — I5032 Chronic diastolic (congestive) heart failure: Secondary | ICD-10-CM | POA: Diagnosis not present

## 2019-04-19 DIAGNOSIS — I82511 Chronic embolism and thrombosis of right femoral vein: Secondary | ICD-10-CM | POA: Diagnosis not present

## 2019-04-19 DIAGNOSIS — R441 Visual hallucinations: Secondary | ICD-10-CM | POA: Diagnosis not present

## 2019-04-19 DIAGNOSIS — I4891 Unspecified atrial fibrillation: Secondary | ICD-10-CM | POA: Diagnosis not present

## 2019-04-19 DIAGNOSIS — F330 Major depressive disorder, recurrent, mild: Secondary | ICD-10-CM | POA: Diagnosis not present

## 2019-04-19 DIAGNOSIS — I10 Essential (primary) hypertension: Secondary | ICD-10-CM | POA: Diagnosis not present

## 2019-04-19 DIAGNOSIS — N183 Chronic kidney disease, stage 3 (moderate): Secondary | ICD-10-CM | POA: Diagnosis not present

## 2019-04-19 DIAGNOSIS — K5909 Other constipation: Secondary | ICD-10-CM | POA: Diagnosis not present

## 2019-04-22 ENCOUNTER — Other Ambulatory Visit: Payer: Self-pay | Admitting: Internal Medicine

## 2019-04-22 DIAGNOSIS — F0390 Unspecified dementia without behavioral disturbance: Secondary | ICD-10-CM | POA: Diagnosis not present

## 2019-04-22 DIAGNOSIS — M1990 Unspecified osteoarthritis, unspecified site: Secondary | ICD-10-CM | POA: Diagnosis not present

## 2019-04-22 DIAGNOSIS — R2689 Other abnormalities of gait and mobility: Secondary | ICD-10-CM | POA: Diagnosis not present

## 2019-04-22 DIAGNOSIS — R441 Visual hallucinations: Secondary | ICD-10-CM | POA: Diagnosis not present

## 2019-04-22 DIAGNOSIS — I714 Abdominal aortic aneurysm, without rupture, unspecified: Secondary | ICD-10-CM

## 2019-04-22 DIAGNOSIS — I130 Hypertensive heart and chronic kidney disease with heart failure and stage 1 through stage 4 chronic kidney disease, or unspecified chronic kidney disease: Secondary | ICD-10-CM | POA: Diagnosis not present

## 2019-04-22 DIAGNOSIS — I5032 Chronic diastolic (congestive) heart failure: Secondary | ICD-10-CM | POA: Diagnosis not present

## 2019-04-22 DIAGNOSIS — N183 Chronic kidney disease, stage 3 (moderate): Secondary | ICD-10-CM | POA: Diagnosis not present

## 2019-04-22 DIAGNOSIS — D649 Anemia, unspecified: Secondary | ICD-10-CM | POA: Diagnosis not present

## 2019-05-03 DIAGNOSIS — B351 Tinea unguium: Secondary | ICD-10-CM | POA: Diagnosis not present

## 2019-05-03 DIAGNOSIS — M79674 Pain in right toe(s): Secondary | ICD-10-CM | POA: Diagnosis not present

## 2019-05-03 DIAGNOSIS — M79675 Pain in left toe(s): Secondary | ICD-10-CM | POA: Diagnosis not present

## 2019-05-04 ENCOUNTER — Ambulatory Visit: Admission: RE | Admit: 2019-05-04 | Payer: Medicare HMO | Source: Ambulatory Visit

## 2019-05-05 DIAGNOSIS — N2581 Secondary hyperparathyroidism of renal origin: Secondary | ICD-10-CM | POA: Insufficient documentation

## 2019-05-06 DIAGNOSIS — R600 Localized edema: Secondary | ICD-10-CM | POA: Insufficient documentation

## 2019-05-06 DIAGNOSIS — I10 Essential (primary) hypertension: Secondary | ICD-10-CM | POA: Diagnosis not present

## 2019-05-06 DIAGNOSIS — D61818 Other pancytopenia: Secondary | ICD-10-CM | POA: Insufficient documentation

## 2019-05-06 DIAGNOSIS — D631 Anemia in chronic kidney disease: Secondary | ICD-10-CM | POA: Insufficient documentation

## 2019-05-06 DIAGNOSIS — N2581 Secondary hyperparathyroidism of renal origin: Secondary | ICD-10-CM | POA: Diagnosis not present

## 2019-05-06 DIAGNOSIS — N189 Chronic kidney disease, unspecified: Secondary | ICD-10-CM | POA: Insufficient documentation

## 2019-05-06 DIAGNOSIS — N183 Chronic kidney disease, stage 3 (moderate): Secondary | ICD-10-CM | POA: Diagnosis not present

## 2019-05-14 ENCOUNTER — Ambulatory Visit
Admission: RE | Admit: 2019-05-14 | Discharge: 2019-05-14 | Disposition: A | Discharge status: Home / Self Care | Payer: Medicare HMO | Source: Ambulatory Visit | Attending: Internal Medicine | Admitting: Internal Medicine

## 2019-05-14 ENCOUNTER — Other Ambulatory Visit: Payer: Self-pay

## 2019-05-14 DIAGNOSIS — I714 Abdominal aortic aneurysm, without rupture, unspecified: Secondary | ICD-10-CM

## 2019-05-24 ENCOUNTER — Ambulatory Visit (INDEPENDENT_AMBULATORY_CARE_PROVIDER_SITE_OTHER): Payer: Medicare HMO | Admitting: Vascular Surgery

## 2019-05-24 ENCOUNTER — Other Ambulatory Visit: Payer: Self-pay

## 2019-05-24 ENCOUNTER — Encounter (INDEPENDENT_AMBULATORY_CARE_PROVIDER_SITE_OTHER): Payer: Self-pay | Admitting: Vascular Surgery

## 2019-05-24 VITALS — BP 133/72 | HR 74 | Resp 16 | Wt 246.0 lb

## 2019-05-24 DIAGNOSIS — N1832 Chronic kidney disease, stage 3b: Secondary | ICD-10-CM | POA: Diagnosis not present

## 2019-05-24 DIAGNOSIS — I714 Abdominal aortic aneurysm, without rupture, unspecified: Secondary | ICD-10-CM

## 2019-05-24 DIAGNOSIS — I872 Venous insufficiency (chronic) (peripheral): Secondary | ICD-10-CM

## 2019-05-24 DIAGNOSIS — I10 Essential (primary) hypertension: Secondary | ICD-10-CM | POA: Diagnosis not present

## 2019-05-24 DIAGNOSIS — I713 Abdominal aortic aneurysm, ruptured, unspecified: Secondary | ICD-10-CM

## 2019-05-24 DIAGNOSIS — I482 Chronic atrial fibrillation, unspecified: Secondary | ICD-10-CM

## 2019-05-24 NOTE — Progress Notes (Signed)
MRN : [id number]  Nicole Bishop is a 85 y.o. (27-Nov-1930) female who presents with chief complaint of No chief complaint on file. Marland Kitchen  History of Present Illness:   The patient presents to the office for evaluation of an abdominal aortic aneurysm. The aneurysm was noted on recent CT scan. Patient denies abdominal pain or unusual back pain, no other abdominal complaints.  No history of an acute onset of painful blue discoloration of the toes.     No family history of AAA.   The patient is also seen for followup evaluation regarding leg swelling.  The swelling has improved quite a bit and the pain associated with swelling has decreased substantially. There have not been any interval development of a ulcerations or wounds.  Since the previous visit the patient has been wearing graduated compression stockings and has noted little significant improvement in the lymphedema. The patient has been using compression routinely morning until night.  The patient also states elevation during the day and exercise is being done too.  Patient denies amaurosis fugax or TIA symptoms. There is no history of claudication or rest pain symptoms of the lower extremities.  The patient denies angina or shortness of breath.  CT scan shows an AAA that measures 4.90 cm  No outpatient medications have been marked as taking for the 05/24/19 encounter (Office Visit) with Delana Meyer, Dolores Lory, MD.    Past Medical History:  Diagnosis Date   Anemia    Anxiety    Arthritis    Chronic kidney disease    Coronary artery disease    Depression    Dizziness    Dyspnea    Dysrhythmia    gets tachy if anxious or excited   GERD (gastroesophageal reflux disease)    History of hiatal hernia    HOH (hard of hearing)    Hypertension    Hypothyroidism    IDA (iron deficiency anemia) 12/14/2014   Myocardial infarction [facility]) 1992   Renal insufficiency    Sleep apnea    Tremors of nervous system      Past Surgical History:  Procedure Laterality Date   CATARACT EXTRACTION W/PHACO Left 07/15/2017   Procedure: CATARACT EXTRACTION PHACO AND INTRAOCULAR LENS PLACEMENT (IOC)-LEFT;  Surgeon: Birder Robson, MD;  Location: [facility] ORS;  Service: Ophthalmology;  Laterality: Left;  [place] 00.36.1 AP% 13.9 CDE 5.02 Fluid Pack lot # [id number]   CATARACT EXTRACTION W/PHACO Right 08/26/2017   Procedure: CATARACT EXTRACTION PHACO AND INTRAOCULAR LENS PLACEMENT (IOC);  Surgeon: Birder Robson, MD;  Location: [facility] ORS;  Service: Ophthalmology;  Laterality: Right;  [place] 00:41.0 AP% 15.3 CDE 6.27 FLUID PACK LOT # [id number]   CHOLECYSTECTOMY     COLONOSCOPY WITH PROPOFOL N/A 04/10/2015   Procedure: COLONOSCOPY WITH PROPOFOL;  Surgeon: Manya Silvas, MD;  Location: [facility] ENDOSCOPY;  Service: Endoscopy;  Laterality: N/A;   ESOPHAGOGASTRODUODENOSCOPY (EGD) WITH PROPOFOL N/A 04/10/2015   Procedure: ESOPHAGOGASTRODUODENOSCOPY (EGD) WITH PROPOFOL;  Surgeon: Manya Silvas, MD;  Location: [facility] ENDOSCOPY;  Service: Endoscopy;  Laterality: N/A;   fatty tumor removed on left shoulder     gallbladdedr removed     JOINT REPLACEMENT Bilateral    total knee replacements   REPLACEMENT TOTAL KNEE BILATERAL Bilateral     Social History Social History   Tobacco Use   Smoking status: Never Smoker   Smokeless tobacco: Never Used  Substance Use Topics   Alcohol use: No   Drug use: No    Family History Family  History  Problem Relation Age of Onset   Arthritis Mother    Hypertension Mother    Stroke Mother    Hypertension Father    Heart attack Father    Breast cancer Neg Hx   No family history of bleeding/clotting disorders, porphyria or autoimmune disease   Allergies  Allergen Reactions   Penicillins Rash and Other (See Comments)    Has patient had a PCN reaction causing immediate rash, facial/tongue/throat swelling, SOB or lightheadedness with hypotension: Unknown Has patient had  a PCN reaction causing severe rash involving mucus membranes or skin necrosis: Unknown Has patient had a PCN reaction that required hospitalization: Unknown Has patient had a PCN reaction occurring within the last 10 years: Unknown If all of the above answers are "NO", then may proceed with Cephalosporin use.    Nsaids Other (See Comments)    GI upset   Atorvastatin Rash   Celebrex [Celecoxib] Other (See Comments)    Constipation and stomach upset   Felodipine Nausea Only   Oxaprozin Nausea Only   Rofecoxib Other (See Comments)    GI upset   Simvastatin Rash     REVIEW OF SYSTEMS (Negative unless checked)  Constitutional: [] Weight loss  [] Fever  [] Chills Cardiac: [] Chest pain   [] Chest pressure   [] Palpitations   [] Shortness of breath when laying flat   [] Shortness of breath with exertion. Vascular:  [] Pain in legs with walking   [] Pain in legs at rest  [] History of DVT   [] Phlebitis   [x] Swelling in legs   [] Varicose veins   [] Non-healing ulcers Pulmonary:   [] Uses home oxygen   [] Productive cough   [] Hemoptysis   [] Wheeze  [] COPD   [] Asthma Neurologic:  [] Dizziness   [] Seizures   [] History of stroke   [] History of TIA  [] Aphasia   [] Vissual changes   [] Weakness or numbness in arm   [] Weakness or numbness in leg Musculoskeletal:   [] Joint swelling   [x] Joint pain   [x] Low back pain Hematologic:  [] Easy bruising  [] Easy bleeding   [] Hypercoagulable state   [] Anemic Gastrointestinal:  [] Diarrhea   [] Vomiting  [] Gastroesophageal reflux/heartburn   [] Difficulty swallowing. Genitourinary:  [x] Chronic kidney disease   [] Difficult urination  [] Frequent urination   [] Blood in urine Skin:  [] Rashes   [] Ulcers  Psychological:  [] History of anxiety   []  History of major depression.  Physical Examination  Vitals:   05/24/19 1629  BP: 133/72  Pulse: 74  Resp: 16  Weight: 246 lb (111.6 kg)   Body mass index is 33.36 kg/m. Gen: WD/WN, NAD; obese Head: [place]/AT, No temporalis  wasting.  Ear/Nose/Throat: Hearing grossly intact, nares w/o erythema or drainage, poor dentition Eyes: PER, EOMI, sclera nonicteric.  Neck: Supple, no masses.  No bruit or JVD.  Pulmonary:  Good air movement, clear to auscultation bilaterally, no use of accessory muscles.  Cardiac: RRR, normal S1, S2, no Murmurs. Vascular: scattered varicosities present bilaterally.  Mild venous stasis changes to the legs bilaterally.  3+ soft pitting edema Vessel Right Left  Radial Palpable Palpable  PT Palpable Palpable  DP Palpable Palpable  Gastrointestinal: soft, non-distended. No guarding/no peritoneal signs.  Musculoskeletal: M/S 5/5 throughout.  No deformity or atrophy.  Neurologic: CN 2-12 intact. Pain and light touch intact in extremities.  Symmetrical.  Speech is fluent. Motor exam as listed above. Psychiatric: Judgment intact, Mood & affect appropriate for pt's clinical situation. Dermatologic: mild venous rashes no ulcers noted.  No changes consistent with cellulitis. Lymph : No Cervical  lymphadenopathy, no lichenification or skin changes of chronic lymphedema.  CBC Lab Results  Component Value Date   WBC 4.0 06/09/2018   HGB 10.0 (L) 06/09/2018   HCT 32.5 (L) 06/09/2018   MCV 88.3 06/09/2018   PLT 196 06/09/2018    BMET    Component Value Date/Time   NA 142 06/09/2018 0616   NA 135 11/21/2014 1301   K 3.7 06/09/2018 0616   K 4.1 11/21/2014 1301   CL 112 (H) 06/09/2018 0616   CL 105 11/21/2014 1301   CO2 24 06/09/2018 0616   CO2 23 11/21/2014 1301   GLUCOSE 94 06/09/2018 0616   GLUCOSE 102 (H) 11/21/2014 1301   BUN 18 06/09/2018 0616   BUN 15 11/21/2014 1301   CREATININE 0.94 06/09/2018 0616   CREATININE 1.08 (H) 11/21/2014 1301   CALCIUM 9.1 06/09/2018 0616   CALCIUM 9.0 11/21/2014 1301   GFRNONAA 54 (L) 06/09/2018 0616   GFRNONAA 48 (L) 11/21/2014 1301   GFRAA >60 06/09/2018 0616   GFRAA 56 (L) 11/21/2014 1301   CrCl cannot be calculated (Patient's most recent lab  result is older than the maximum 21 days allowed.).  COAG Lab Results  Component Value Date   INR 1.20 06/09/2018   INR 1.1 11/21/2014    Radiology Ct Abdomen Pelvis Wo Contrast  Result Date: 05/14/2019 CLINICAL DATA:  85 year old female with a history of abdominal aortic aneurysm EXAM: CT ABDOMEN AND PELVIS WITHOUT CONTRAST TECHNIQUE: Multidetector CT imaging of the abdomen and pelvis was performed following the standard protocol without IV contrast. COMPARISON:  January 20, 2017 FINDINGS: Lower chest: Scarring/atelectasis at the lung bases. Hepatobiliary: Hypodense rounded lesion within the left liver with rim calcification/surgical changes similar to the prior CT measures 2.8 cm. Subcentimeter hypodense focus within the right liver on image 30 of series 2 and image 23 of series 2 most likely benign cyst. Cholecystectomy. Pancreas: Unremarkable pancreas Spleen: Unremarkable spleen Adrenals/Urinary Tract: Unremarkable adrenal glands. Right kidney unremarkable without hydronephrosis or nephrolithiasis. Similar degree of cortical thinning. Left kidney without hydronephrosis or nephrolithiasis. Similar degree of cortical thinning. Unremarkable course the bilateral ureters. Unremarkable urinary bladder. Stomach/Bowel: Hiatal hernia. Unremarkable small bowel. Normal appendix. Stents of diverticular change. No focal inflammatory changes. No obstruction. Vascular/Lymphatic: Atherosclerotic changes of the abdominal aorta. Infrarenal abdominal aortic aneurysm again demonstrated. Greatest diameter 4.9 cm. Previous maximum diameter 4.5 cm. Left common iliac artery measures 2.0 cm. Right common iliac artery measures 1.9 cm. 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 Atherosclerotic changes of the bilateral external iliac arteries and bilateral hypogastric arteries. Lymph node versus cisterna chyli in the right retrocrural space is unchanged,  measuring 11 mm. No lymphadenopathy of the retroperitoneum or iliac stations. Reproductive: Unremarkable uterus and adnexa Other: Fat containing umbilical hernia Musculoskeletal: No acute displaced fracture. Advanced multilevel degenerative changes of the visualized thoracolumbar spine with vacuum disc phenomenon at all levels. Degenerative changes of the hips IMPRESSION: Redemonstration of infrarenal abdominal aortic aneurysm, which has increased from the prior, now measuring 4.9 cm. Recommend followup by abdomen and pelvis CTA in 6 months, and vascular referral/consultation if not already obtained. This recommendation follows ACR consensus guidelines: White Paper of the ACR Incidental Findings Committee II on Vascular Findings. J Am Coll Radiol 2013; 10:789-794. 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 Aortic Atherosclerosis (ICD10-I70.0). Additional ancillary findings as above. Electronically  Signed   By: Corrie Mckusick D.O.   On: 05/14/2019 16:04      Assessment/Plan 1. AAA (abdominal aortic aneurysm) without rupture (HCC) No surgery or intervention at this time. The patient has an asymptomatic abdominal aortic aneurysm that is greater than 4 cm but less than 5 cm in maximal diameter.  I have discussed the natural history of abdominal aortic aneurysm and the small risk of rupture for aneurysm less than 5 cm in size.  However, as these small aneurysms tend to enlarge over time, continued surveillance with ultrasound or CT scan is mandatory.  I have also discussed optimizing medical management with hypertension and lipid control and the importance of abstinence from tobacco.  The patient is also encouraged to exercise a minimum of 30 minutes 4 times a week.  Should the patient develop new onset abdominal or back pain or signs of peripheral embolization they are instructed to seek medical attention  immediately and to alert the physician providing care that they have an aneurysm.  The patient voices their understanding. I have scheduled the patient to return in 6 months with an aortic duplex. - VAS US AORTA/IVC/ILIACS; Future  2. Chronic venous insufficiency No surgery or intervention at this point in time.    I have reviewed my discussion with the patient regarding venous insufficiency and secondary lymph edema and why it  causes symptoms. I have discussed with the patient the chronic skin changes that accompany these problems and the long term sequela such as ulceration and infection.  Patient will continue wearing graduated compression stockings class 1 (20-30 mmHg) on a daily basis a prescription was given to the patient to keep this updated. The patient will  put the stockings on first thing in the morning and removing them in the evening. The patient is instructed specifically not to sleep in the stockings.  In addition, behavioral modification including elevation during the day will be continued.  Diet and salt restriction was also discussed.  Previous duplex ultrasound of the lower extremities shows normal deep venous system, superficial reflux was not present.   Following the review of the ultrasound the patient will follow up in 12 months to reassess the degree of swelling and the control that graduated compression is offering.   The patient can be assessed for a Lymph Pump at that time.  However, at this time the patient states they are satisfied with the control compression and elevation is yielding.    3. Chronic atrial fibrillation (HCC) Continue antiarrhythmia medications as already ordered, these medications have been reviewed and there are no changes at this time.  Continue anticoagulation as ordered by Cardiology Service   4. Essential (primary) hypertension Continue antihypertensive medications as already ordered, these medications have been reviewed and there are no  changes at this time.   5. Stage 3b chronic kidney disease Avoid nephrotoxic medications and dehydration.  Further plans per nephrology  6. Abdominal aortic aneurysm, ruptured ([place]) See #1 - CT Angio Abd/Pel w/ and/or w/o; Future   Hortencia Pilar, MD  05/24/2019 4:29 PM

## 2019-06-03 DIAGNOSIS — I890 Lymphedema, not elsewhere classified: Secondary | ICD-10-CM | POA: Diagnosis not present

## 2019-06-03 DIAGNOSIS — E782 Mixed hyperlipidemia: Secondary | ICD-10-CM | POA: Diagnosis not present

## 2019-06-03 DIAGNOSIS — R42 Dizziness and giddiness: Secondary | ICD-10-CM | POA: Diagnosis not present

## 2019-06-03 DIAGNOSIS — I10 Essential (primary) hypertension: Secondary | ICD-10-CM | POA: Diagnosis not present

## 2019-06-03 DIAGNOSIS — I82511 Chronic embolism and thrombosis of right femoral vein: Secondary | ICD-10-CM | POA: Diagnosis not present

## 2019-06-03 DIAGNOSIS — R0602 Shortness of breath: Secondary | ICD-10-CM | POA: Diagnosis not present

## 2019-06-03 DIAGNOSIS — I5032 Chronic diastolic (congestive) heart failure: Secondary | ICD-10-CM | POA: Diagnosis not present

## 2019-06-03 DIAGNOSIS — I4891 Unspecified atrial fibrillation: Secondary | ICD-10-CM | POA: Diagnosis not present

## 2019-07-20 DIAGNOSIS — R441 Visual hallucinations: Secondary | ICD-10-CM | POA: Diagnosis not present

## 2019-07-20 DIAGNOSIS — M8949 Other hypertrophic osteoarthropathy, multiple sites: Secondary | ICD-10-CM | POA: Diagnosis not present

## 2019-07-20 DIAGNOSIS — F330 Major depressive disorder, recurrent, mild: Secondary | ICD-10-CM | POA: Diagnosis not present

## 2019-07-20 DIAGNOSIS — I10 Essential (primary) hypertension: Secondary | ICD-10-CM | POA: Diagnosis not present

## 2019-07-20 DIAGNOSIS — R2689 Other abnormalities of gait and mobility: Secondary | ICD-10-CM | POA: Diagnosis not present

## 2019-07-20 DIAGNOSIS — R8290 Unspecified abnormal findings in urine: Secondary | ICD-10-CM | POA: Diagnosis not present

## 2019-07-20 DIAGNOSIS — F0151 Vascular dementia with behavioral disturbance: Secondary | ICD-10-CM | POA: Diagnosis not present

## 2019-07-20 DIAGNOSIS — D649 Anemia, unspecified: Secondary | ICD-10-CM | POA: Diagnosis not present

## 2019-07-20 DIAGNOSIS — H9190 Unspecified hearing loss, unspecified ear: Secondary | ICD-10-CM | POA: Diagnosis not present

## 2019-07-20 DIAGNOSIS — I5032 Chronic diastolic (congestive) heart failure: Secondary | ICD-10-CM | POA: Diagnosis not present

## 2019-07-27 DIAGNOSIS — Z23 Encounter for immunization: Secondary | ICD-10-CM | POA: Diagnosis not present

## 2019-07-27 DIAGNOSIS — D649 Anemia, unspecified: Secondary | ICD-10-CM | POA: Diagnosis not present

## 2019-07-27 DIAGNOSIS — F0152 Vascular dementia, unspecified severity, with psychotic disturbance: Secondary | ICD-10-CM | POA: Insufficient documentation

## 2019-07-27 DIAGNOSIS — R441 Visual hallucinations: Secondary | ICD-10-CM | POA: Diagnosis not present

## 2019-07-27 DIAGNOSIS — I110 Hypertensive heart disease with heart failure: Secondary | ICD-10-CM | POA: Diagnosis not present

## 2019-07-27 DIAGNOSIS — Z6834 Body mass index (BMI) 34.0-34.9, adult: Secondary | ICD-10-CM | POA: Diagnosis not present

## 2019-07-27 DIAGNOSIS — F22 Delusional disorders: Secondary | ICD-10-CM | POA: Diagnosis not present

## 2019-07-27 DIAGNOSIS — I5032 Chronic diastolic (congestive) heart failure: Secondary | ICD-10-CM | POA: Diagnosis not present

## 2019-07-27 DIAGNOSIS — F0151 Vascular dementia with behavioral disturbance: Secondary | ICD-10-CM | POA: Diagnosis not present

## 2019-07-27 DIAGNOSIS — F0150 Vascular dementia without behavioral disturbance: Secondary | ICD-10-CM | POA: Insufficient documentation

## 2019-07-28 ENCOUNTER — Other Ambulatory Visit: Payer: Self-pay | Admitting: Internal Medicine

## 2019-07-28 DIAGNOSIS — F0152 Vascular dementia, unspecified severity, with psychotic disturbance: Secondary | ICD-10-CM

## 2019-07-28 DIAGNOSIS — F0151 Vascular dementia with behavioral disturbance: Secondary | ICD-10-CM

## 2019-07-28 DIAGNOSIS — R441 Visual hallucinations: Secondary | ICD-10-CM

## 2019-08-09 ENCOUNTER — Ambulatory Visit: Payer: Medicare HMO

## 2019-08-11 DIAGNOSIS — E538 Deficiency of other specified B group vitamins: Secondary | ICD-10-CM | POA: Diagnosis not present

## 2019-09-09 DIAGNOSIS — N2581 Secondary hyperparathyroidism of renal origin: Secondary | ICD-10-CM | POA: Diagnosis not present

## 2019-09-09 DIAGNOSIS — D631 Anemia in chronic kidney disease: Secondary | ICD-10-CM | POA: Diagnosis not present

## 2019-09-09 DIAGNOSIS — N1831 Chronic kidney disease, stage 3a: Secondary | ICD-10-CM | POA: Diagnosis not present

## 2019-09-09 DIAGNOSIS — R600 Localized edema: Secondary | ICD-10-CM | POA: Diagnosis not present

## 2019-09-09 DIAGNOSIS — I10 Essential (primary) hypertension: Secondary | ICD-10-CM | POA: Diagnosis not present

## 2019-09-10 DIAGNOSIS — N2581 Secondary hyperparathyroidism of renal origin: Secondary | ICD-10-CM | POA: Diagnosis not present

## 2019-09-10 DIAGNOSIS — R600 Localized edema: Secondary | ICD-10-CM | POA: Diagnosis not present

## 2019-09-10 DIAGNOSIS — D631 Anemia in chronic kidney disease: Secondary | ICD-10-CM | POA: Diagnosis not present

## 2019-09-10 DIAGNOSIS — N1831 Chronic kidney disease, stage 3a: Secondary | ICD-10-CM | POA: Diagnosis not present

## 2019-09-10 DIAGNOSIS — I10 Essential (primary) hypertension: Secondary | ICD-10-CM | POA: Diagnosis not present

## 2019-09-13 ENCOUNTER — Ambulatory Visit
Admission: RE | Admit: 2019-09-13 | Discharge: 2019-09-13 | Disposition: A | Discharge status: Home / Self Care | Payer: Medicare HMO | Source: Ambulatory Visit | Attending: Internal Medicine | Admitting: Internal Medicine

## 2019-09-13 DIAGNOSIS — N6342 Unspecified lump in left breast, subareolar: Secondary | ICD-10-CM | POA: Diagnosis not present

## 2019-09-13 DIAGNOSIS — N6320 Unspecified lump in the left breast, unspecified quadrant: Secondary | ICD-10-CM

## 2019-09-13 DIAGNOSIS — R928 Other abnormal and inconclusive findings on diagnostic imaging of breast: Secondary | ICD-10-CM | POA: Diagnosis not present

## 2019-09-14 DIAGNOSIS — D649 Anemia, unspecified: Secondary | ICD-10-CM | POA: Diagnosis not present

## 2019-09-17 ENCOUNTER — Other Ambulatory Visit: Payer: Self-pay

## 2019-09-17 NOTE — Progress Notes (Signed)
[facility]  [address] [place], [place] [address] Phone: [phone]  Fax: [phone]   Clinic Day:  09/20/2019  Referring physician: Tracie Harrier, MD  Chief Complaint: Nicole Bishop is a 86 y.o. female with stage III chronic kidney disease and anemia who is referred in consultation by Dr Tracie Harrier for assessment and management.   HPI:   The patient has a history of anemia in stage III chronic kidney disease. She was last seen in the hematology clinic on 06/19/2018.  At that time, her energy was "ok".  She was participating in physical therapy at SNF.  She noted generalized weakness.  She was experiencing intermittent visual hallucinations secondary to her dementia.  Exam revealed bilateral ankle edema.  She was on Xarelto for a right lower extremity DVT.  CBC followed: 09/17/2018: Hematocrit 34.7, hemoglobin 11.1, MCV 83.6, platelets 231,000, WBC 4500. 01/15/2019: Hematocrit 31.2, hemoglobin 10.1, MCV 84.3, platelets 202,000, WBC 4400.  05/06/2019: Hematocrit 31.8, hemoglobin 10.7, MCV 84.3, platelets 232,000, WBC 4800.  09/15/2019: Hematocrit 31.9, hemoglobin 10.7, MCV 86.6, platelets 181,000, WBC 3900.   Creatinine has ranged between 1.07 - 1.39.  Ferritin was 61 with an iron saturation of 15% and a TIBC 302 on 08/25/2017.  Ferritin was 77 with an iron saturation of 6% and a TIBC 274 on 10/24/20219.  Urinalysis on 01/22/201 revealed no hematuria.  She was last seen by Dr. Holley Raring on 09/09/2019.  At that time, she was noted to have anemia of chronic disease.  She had increasing fatigue.  Plan was for erythropoietin if her hemoglobin was < 10.  Symptomatically, she is chronically fatigued. She seems to have lost weight and has gaining some back.  Her daughter says her mother lives alright by herself and eats how she wants to eat. Some days she doesn't have much of an appetite.  She describes eating eggs with 2 beef sausage and 2 slices of  toast with a cup of coffee. She notes that she didn't eat for the rest of the day.   She has occasional exertional shortness of breath.  She notes leg swelling; she tries to keep her legs elevated. She had some confusion yesterday.  She got up and got ready to come to doctors visit.  She has had hallucinations. Her daughter says she sees bugs and people who aren't there.  Her daughter says her hallucinations weren't as bad when her mother was receiving IV iron.   She takes tablet 27 mg oral iron BID.  She takes oral B12.  Her daughter notes that she received a B-12 injection.   She received COVID vaccine 09/17/2019. She had cataract surgery both eyes in May of 2020.   Past Medical History:  Diagnosis Date  . Anemia   . Anxiety   . Arthritis   . Chronic kidney disease   . Coronary artery disease   . Depression   . Dizziness   . Dyspnea   . Dysrhythmia    gets tachy if anxious or excited  . GERD (gastroesophageal reflux disease)   . History of hiatal hernia   . HOH (hard of hearing)   . Hypertension   . Hypothyroidism   . IDA (iron deficiency anemia) 12/14/2014  . Myocardial infarction ([place]) 1992  . Renal insufficiency   . Sleep apnea   . Tremors of nervous system     Past Surgical History:  Procedure Laterality Date  . CATARACT EXTRACTION W/PHACO Left 07/15/2017   Procedure: CATARACT  EXTRACTION PHACO AND INTRAOCULAR LENS PLACEMENT (IOC)-LEFT;  Surgeon: Birder Robson, MD;  Location: [facility] ORS;  Service: Ophthalmology;  Laterality: Left;  [place] 00.36.1 AP% 13.9 CDE 5.02 Fluid Pack lot # [id number]  . CATARACT EXTRACTION W/PHACO Right 08/26/2017   Procedure: CATARACT EXTRACTION PHACO AND INTRAOCULAR LENS PLACEMENT (IOC);  Surgeon: Birder Robson, MD;  Location: [facility] ORS;  Service: Ophthalmology;  Laterality: Right;  [place] 00:41.0 AP% 15.3 CDE 6.27 FLUID PACK LOT # [id number]  . CHOLECYSTECTOMY    . COLONOSCOPY WITH PROPOFOL N/A 04/10/2015   Procedure: COLONOSCOPY WITH PROPOFOL;   Surgeon: Manya Silvas, MD;  Location: [facility] ENDOSCOPY;  Service: Endoscopy;  Laterality: N/A;  . ESOPHAGOGASTRODUODENOSCOPY (EGD) WITH PROPOFOL N/A 04/10/2015   Procedure: ESOPHAGOGASTRODUODENOSCOPY (EGD) WITH PROPOFOL;  Surgeon: Manya Silvas, MD;  Location: [facility] ENDOSCOPY;  Service: Endoscopy;  Laterality: N/A;  . fatty tumor removed on left shoulder    . gallbladdedr removed    . JOINT REPLACEMENT Bilateral    total knee replacements  . REPLACEMENT TOTAL KNEE BILATERAL Bilateral     Family History  Problem Relation Age of Onset  . Arthritis Mother   . Hypertension Mother   . Stroke Mother   . Hypertension Father   . Heart attack Father   . Breast cancer Neg Hx     Social History:  reports that she has never smoked. She has never used smokeless tobacco. She reports that she does not drink alcohol or use drugs. She lives at home alone.  She lives in [place].  The patient is accompanied by her daughter via [place] today.  Allergies:  Allergies  Allergen Reactions  . Penicillins Rash and Other (See Comments)    Has patient had a PCN reaction causing immediate rash, facial/tongue/throat swelling, SOB or lightheadedness with hypotension: Unknown Has patient had a PCN reaction causing severe rash involving mucus membranes or skin necrosis: Unknown Has patient had a PCN reaction that required hospitalization: Unknown Has patient had a PCN reaction occurring within the last 10 years: Unknown If all of the above answers are "NO", then may proceed with Cephalosporin use.   . Nsaids Other (See Comments)    GI upset  . Rosuvastatin Other (See Comments)  . Atorvastatin Rash  . Celebrex [Celecoxib] Other (See Comments)    Constipation and stomach upset  . Felodipine Nausea Only  . Oxaprozin Nausea Only  . Rofecoxib Other (See Comments)    GI upset  . Simvastatin Rash    Current Medications: Current Outpatient Medications  Medication Sig Dispense Refill  . acetaminophen  (TYLENOL) 650 MG CR tablet Take 650 mg by mouth every 8 (eight) hours as needed for pain.    Marland Kitchen amLODipine (NORVASC) 5 MG tablet Take 5 mg by mouth daily.    . calcitRIOL (ROCALTROL) 0.25 MCG capsule Take 1 capsule by mouth daily.    . Cholecalciferol (VITAMIN D3) 2000 units capsule Take 4,000 Units by mouth daily.    . CYANOCOBALAMIN PO Take 1,000 mcg by mouth daily.    . diclofenac Sodium (VOLTAREN) 1 % GEL Apply 2 g topically as needed.     . docusate sodium (COLACE) 100 MG capsule Take 100 mg by mouth 2 (two) times daily.     Marland Kitchen esomeprazole (NEXIUM) 40 MG capsule Take 40 mg by mouth daily as needed (acid reflux).     . Ferrous Sulfate (SLOW FE) 142 (45 Fe) MG TBCR Take 1 tablet by mouth 2 (two) times daily.     Marland Kitchen  lactulose (CONSTULOSE) 10 GM/15ML solution Take 10 g by mouth daily as needed for mild constipation.     Marland Kitchen loratadine (CLARITIN) 10 MG tablet Take 1 tablet (10 mg total) by mouth daily. (Patient taking differently: Take 10 mg by mouth daily as needed. ) 30 tablet 0  . metoprolol succinate (TOPROL-XL) 50 MG 24 hr tablet Take 1 tablet (50 mg total) by mouth daily. Take with or immediately following a meal. (Patient taking differently: Take 25 mg by mouth daily. Take with or immediately following a meal.) 30 tablet 0  . mometasone (NASONEX) 50 MCG/ACT nasal spray Place 2 sprays into the nose daily as needed (allergies).     . olmesartan (BENICAR) 20 MG tablet Take 20 mg by mouth daily.     Marland Kitchen olopatadine (PATANOL) 0.1 % ophthalmic solution Place 1 drop into both eyes 2 (two) times daily as needed for allergies.     . polyethylene glycol (MIRALAX / GLYCOLAX) packet Take 17 g by mouth daily as needed for mild constipation or moderate constipation.    . risperiDONE (RISPERDAL) 0.25 MG tablet Take 1 tablet by mouth daily.    . rivaroxaban (XARELTO) 20 MG TABS tablet Take 1 tablet (20 mg total) by mouth daily with supper. 30 tablet 0  . benazepril (LOTENSIN) 5 MG tablet Take 5 mg by mouth daily.      No current facility-administered medications for this visit.   Facility-Administered Medications Ordered in Other Visits  Medication Dose Route Frequency Provider Last Rate Last Admin  . 0.9 %  sodium chloride infusion   Intravenous Continuous Lequita Asal, MD   Stopped at 02/06/18 1429    Review of Systems  Constitutional: Positive for malaise/fatigue and weight loss (24 lbs since last visit 06/2018). Negative for chills and fever.  HENT: Positive for hearing loss. Negative for congestion, ear pain, nosebleeds, sinus pain and sore throat.        Rhinorrhea.  Eyes: Negative for blurred vision and double vision.  Respiratory: Positive for shortness of breath (exertional). Negative for cough, hemoptysis and sputum production.   Cardiovascular: Positive for leg swelling. Negative for chest pain, palpitations, orthopnea and PND.  Gastrointestinal: Positive for constipation (occasional; takes stool softner). Negative for abdominal pain, blood in stool, diarrhea, melena, nausea and vomiting.       Dark stools on oral iron.  Genitourinary: Negative.  Negative for dysuria, frequency, hematuria and urgency.  Musculoskeletal: Positive for joint pain (shoulder pain). Negative for back pain, myalgias and neck pain.  Neurological: Positive for headaches (after COVID vaccine on 09/17/2019). Negative for dizziness, sensory change, speech change, focal weakness and seizures.  Endo/Heme/Allergies: Does not bruise/bleed easily.  Psychiatric/Behavioral: Positive for hallucinations (dementia related) and memory loss.   Performance status (ECOG): 2  Vitals Blood pressure (!) 157/89, pulse 67, temperature (!) 96.9 F (36.1 C), temperature source Tympanic, resp. rate 18, height 6' (1.829 m), weight 244 lb 4.8 oz (110.8 kg), SpO2 100 %.   Physical Exam  Constitutional: She appears well-developed and well-nourished.  Elderly woman sitting comfortably in the exam room in no acute distress.  She has  a rolling walker at her side.  HENT:  Head: Normocephalic and atraumatic.  Mouth/Throat: Oropharynx is clear and moist. No oropharyngeal exudate.  Gray hair.  Mask.  Eyes: Pupils are equal, round, and reactive to light. Conjunctivae and EOM are normal. No scleral icterus.  Glasses.  Brown eyes.  Neck: No JVD present.  Cardiovascular: Normal rate, regular rhythm and normal  heart sounds. Exam reveals no gallop.  No murmur heard. Pulmonary/Chest: Effort normal and breath sounds normal. No respiratory distress. She has no wheezes. She has no rales.  Abdominal: Soft. Bowel sounds are normal. She exhibits no distension. There is no abdominal tenderness. There is no rebound and no guarding.  Musculoskeletal:        General: Tenderness (shoulders) and edema (chronnic BLE 2+ R>L) present.     Cervical back: Normal range of motion and neck supple.  Lymphadenopathy:       Head (right side): No preauricular, no posterior auricular and no occipital adenopathy present.       Head (left side): No preauricular, no posterior auricular and no occipital adenopathy present.    She has no cervical adenopathy.    She has no axillary adenopathy.       Right: No inguinal and no supraclavicular adenopathy present.       Left: No inguinal and no supraclavicular adenopathy present.  Neurological: She is alert.  Skin: Skin is warm. No rash noted. No erythema. No pallor.  Psychiatric: She has a normal mood and affect. Her behavior is normal. Thought content normal.  Nursing note and vitals reviewed.   No visits with results within 3 Day(s) from this visit.  Latest known visit with results is:  Admission on 06/08/2018, Discharged on 06/12/2018  Component Date Value Ref Range Status  . Color, Urine 06/08/2018 YELLOW* YELLOW Final  . APPearance 06/08/2018 HAZY* CLEAR Final  . Specific Gravity, Urine 06/08/2018 1.012  1.005 - 1.030 Final  . pH 06/08/2018 5.0  5.0 - 8.0 Final  . Glucose, UA 06/08/2018 NEGATIVE   NEGATIVE mg/dL Final  . Hgb urine dipstick 06/08/2018 SMALL* NEGATIVE Final  . Bilirubin Urine 06/08/2018 NEGATIVE  NEGATIVE Final  . Ketones, ur 06/08/2018 NEGATIVE  NEGATIVE mg/dL Final  . Protein, ur 06/08/2018 NEGATIVE  NEGATIVE mg/dL Final  . Nitrite 06/08/2018 NEGATIVE  NEGATIVE Final  . Leukocytes, UA 06/08/2018 SMALL* NEGATIVE Final  . RBC / HPF 06/08/2018 0-5  0 - 5 RBC/hpf Final  . WBC, UA 06/08/2018 0-5  0 - 5 WBC/hpf Final  . Bacteria, UA 06/08/2018 NONE SEEN  NONE SEEN Final  . Squamous Epithelial / LPF 06/08/2018 6-10  0 - 5 Final  . Mucus 06/08/2018 PRESENT   Final  . Hyaline Casts, UA 06/08/2018 PRESENT   Final   Performed at [facility], [address], [place], [place] [address]  . Sodium 06/08/2018 140  135 - 145 mmol/L Final  . Potassium 06/08/2018 4.2  3.5 - 5.1 mmol/L Final  . Chloride 06/08/2018 110  98 - 111 mmol/L Final  . CO2 06/08/2018 25  22 - 32 mmol/L Final  . Glucose, Bld 06/08/2018 100* 70 - 99 mg/dL Final  . BUN 06/08/2018 23  8 - 23 mg/dL Final  . Creatinine, Ser 06/08/2018 1.09* 0.44 - 1.00 mg/dL Final  . Calcium 06/08/2018 9.4  8.9 - 10.3 mg/dL Final  . GFR calc non Af Amer 06/08/2018 45* >60 mL/min Final  . GFR calc Af Amer 06/08/2018 52* >60 mL/min Final   Comment: (NOTE) The eGFR has been calculated using the CKD EPI equation. This calculation has not been validated in all clinical situations. eGFR's persistently <60 mL/min signify possible Chronic Kidney Disease.   Georgiann Hahn gap 06/08/2018 5  5 - 15 Final   Performed at [facility], [address] [place], [place] [address]  . WBC 06/08/2018 4.6  4.0 -  10.5 K/uL Final  . RBC 06/08/2018 3.90  3.87 - 5.11 MIL/uL Final  . Hemoglobin 06/08/2018 10.6* 12.0 - 15.0 g/dL Final  . HCT 06/08/2018 34.3* 36.0 - 46.0 % Final  . MCV 06/08/2018 87.9  80.0 - 100.0 fL Final  . MCH 06/08/2018 27.2  26.0 - 34.0 pg Final  . MCHC 06/08/2018 30.9  30.0 - 36.0 g/dL Final  . RDW 06/08/2018  14.8  11.5 - 15.5 % Final  . Platelets 06/08/2018 213  150 - 400 K/uL Final  . nRBC 06/08/2018 0.0  0.0 - 0.2 % Final   Performed at [facility], [address], [place], [place] [address]  . Prothrombin Time 06/09/2018 15.1  11.4 - 15.2 seconds Final  . INR 06/09/2018 1.20   Final   Performed at [facility], [address] [place], [place] [address]  . aPTT 06/09/2018 30  24 - 36 seconds Final   Performed at [facility], [address] [place], [place] [address]  . Heparin Unfractionated 06/09/2018 1.47* 0.30 - 0.70 IU/mL Final   Comment: (NOTE) If heparin results are below expected values, and patient dosage has  been confirmed, suggest follow up testing of antithrombin III levels. Performed at [facility], [address] [place], [place] [address]   . Sodium 06/09/2018 142  135 - 145 mmol/L Final  . Potassium 06/09/2018 3.7  3.5 - 5.1 mmol/L Final  . Chloride 06/09/2018 112* 98 - 111 mmol/L Final  . CO2 06/09/2018 24  22 - 32 mmol/L Final  . Glucose, Bld 06/09/2018 94  70 - 99 mg/dL Final  . BUN 06/09/2018 18  8 - 23 mg/dL Final  . Creatinine, Ser 06/09/2018 0.94  0.44 - 1.00 mg/dL Final  . Calcium 06/09/2018 9.1  8.9 - 10.3 mg/dL Final  . GFR calc non Af Amer 06/09/2018 54* >60 mL/min Final  . GFR calc Af Amer 06/09/2018 >60  >60 mL/min Final   Comment: (NOTE) The eGFR has been calculated using the CKD EPI equation. This calculation has not been validated in all clinical situations. eGFR's persistently <60 mL/min signify possible Chronic Kidney Disease.   Georgiann Hahn gap 06/09/2018 6  5 - 15 Final   Performed at [facility], [address] [place], [place] [address]  . WBC 06/09/2018 4.0  4.0 - 10.5 K/uL Final  . RBC 06/09/2018 3.68* 3.87 - 5.11 MIL/uL Final  . Hemoglobin 06/09/2018 10.0* 12.0 - 15.0 g/dL Final  . HCT 06/09/2018 32.5* 36.0 - 46.0 % Final  . MCV 06/09/2018 88.3  80.0 - 100.0 fL Final  . MCH 06/09/2018  27.2  26.0 - 34.0 pg Final  . MCHC 06/09/2018 30.8  30.0 - 36.0 g/dL Final  . RDW 06/09/2018 14.8  11.5 - 15.5 % Final  . Platelets 06/09/2018 196  150 - 400 K/uL Final  . nRBC 06/09/2018 0.0  0.0 - 0.2 % Final   Performed at [facility], [address] [place], [place] [address]  . aPTT 06/09/2018 82* 24 - 36 seconds Final   Comment:        IF BASELINE aPTT IS ELEVATED, SUGGEST PATIENT RISK ASSESSMENT BE USED TO DETERMINE APPROPRIATE ANTICOAGULANT THERAPY. Performed at [facility], [address], [place], [place] [address]   . Retic Ct Pct 06/11/2018 1.3  0.4 - 3.1 % Final  . RBC. 06/11/2018 3.75* 3.87 - 5.11 MIL/uL Final  . Retic Count, Absolute 06/11/2018 49.9  19.0 -  186.0 K/uL Final  . Immature Retic Fract 06/11/2018 11.9  2.3 - 15.9 % Final   Performed at [facility], [address] [place], [place] [address]  . Ferritin 06/11/2018 77  11 - 307 ng/mL Final   Performed at [facility], [address] [place], [place] [address]  . Iron 06/11/2018 16* 28 - 170 ug/dL Final  . TIBC 06/11/2018 274  250 - 450 ug/dL Final  . Saturation Ratios 06/11/2018 6* 10.4 - 31.8 % Final  . UIBC 06/11/2018 258  ug/dL Final   Performed at [facility], [address] [place], [place] [address]  . Sed Rate 06/11/2018 54* 0 - 30 mm/hr Final   Performed at [facility], [address] [place], [place] [address]    Assessment:  ARIYAN SINNETT is a 86 y.o. female African American woman with stage III chronic kidney disease and iron deficiency.  Hematocrit and MCV were normal on 09/03/2013.    She denies any GI bleeding.  Diet is modest.  EGD on 04/10/2015 revealed a large hiatal hernia and gastritis.  Biopsy was unremarkable.  Colonoscopy on 04/10/2015 revealed diverticulosis in the sigmoid, descending, transverse, and ascending colon.  She had internal hemorrhoids.  She has B12 deficiency.  She received monthly injections at the  [facility] (last 07/2016).  She began oral B12 in 08/2016.  B12 and folate were normal on 02/03/2018.  She is intolerant of oral iron secondary to baseline chronic constipation.   Labs on 09/12/2014 revealed a hematocrit of 30 with an MCV of 67. Ferritin was 6 (low) with a TIBC of 487 (high).  Normal studies included direct [place], folic acid, G95, haptoglobin, erythropoietin, and SPEP.    She has received weekly Venofer:  800 mg (10/21/2014 - 11/11/2014), 400 mg (03/03/2015 and 03/10/2015), 400 mg (10/20/2015 and 10/27/2015), 200 mg (04/16/2016), 600 mg (11/11/2016 - 11/25/2016), 600 mg (05/23/2017 - 06/06/2017), 400 mg (02/06/2018 - 02/13/2018), and 200 mg (06/11/2018).  She receives Venofer if her ferritin is < 30.    Ferritin has been followed:  14 on 12/28/2014, 9 on 02/23/2015, 113 on 03/17/2015, 12 on 10/13/2015, 31 on 01/10/2016, 19 on 04/11/2016, 23 on 07/18/2016, 9 on 11/11/2016, 9 on 05/13/2017, 46 on 08/07/2017, 41 on 11/10/2017, 32 on 02/03/2018, 132 on 05/11/2018, and 77 on 06/11/2018.  Goal ferritin is 100.  She was admitted to [facility] from 06/09/2018 - 06/12/2018 with a right lower extremity DVT.   Duplex revealed an occlusive thrombus in the proximal and mid right femoral vein with nonocclusive thrombus in the distal femoral vein and right popliteal vein.  She was discharged on Xarelto.   She received COVID vaccine 09/17/2019.   Symptomatically, she is fatigued.  She denies any bleeding.  Appetite is modest.  She has lost 24 pounds in 15 months.  Plan: 1.   Labs today:  CBC with diff, ferritin, iron studies, B12, folate, TSH. 2.   Normocytic anemia  Patient has a history of B12 and iron deficiency.  She has been on oral iron BID as well as oral B12.  Ferritin goal 100.  Discuss reinitiation of IV iron if ferritin < 30.   Preauth Venofer.  Suspect some component of stage III chronic kidney disease.   Review consideration of Retacrit.   Side effects reviewed.   Information provided.   Preauth Venofer. 3.   RIGHT lower extremity DVT Continue Xarelto. 4.   Stage III chronic kidney disease Anticipate initiation of Retacrit  if hemoglobin is < 10 and iron stores replete. 5.   B12 deficiency Check B12 level on oral B12. If B12 < 400, begin B12 injections. 6.   Weight loss  Patient has a modest appetite.  She denies any nausea, vomiting or diarrhea.  Encourage caloric intake.  Discuss plan for further investigation if weight loss continues. 7.   Preauth Venofer and B12. 8.   RTC in 1 week for MD assessment, review of workup and +/- Venofer and +/-B12.  I discussed the assessment and treatment plan with the patient.  The patient was provided an opportunity to ask questions and all were answered.  The patient agreed with the plan and demonstrated an understanding of the instructions.  The patient was advised to call back if the symptoms worsen or if the condition fails to improve as anticipated.  I provided 20 minutes (9:40 AM - 10:00 AM) of face-to-face time during this this encounter and > 50% was spent counseling as documented under my assessment and plan. An additional 10-12 minutes were spent reviewing the notes and labs since 06/2018 in [employer] and [employer].   Brixton Schnapp C. Mike Gip, MD, PhD    09/20/2019, 10:00 AM  I, Samul Dada, am acting as scribe for [employer]. Mike Gip, MD, PhD.  I, Jerod Mcquain C. Mike Gip, MD, have reviewed the above documentation for accuracy and completeness, and I agree with the above.

## 2019-09-17 NOTE — Progress Notes (Signed)
Confirmed Name and DOB. Assessment completed with assistance from patient's daughter Cloyd Stagers. Reports issues with some hallucinations at times. Denies any other concerns.

## 2019-09-20 ENCOUNTER — Encounter: Payer: Self-pay | Admitting: Hematology and Oncology

## 2019-09-20 ENCOUNTER — Inpatient Hospital Stay: Payer: Medicare HMO | Attending: Hematology and Oncology | Admitting: Hematology and Oncology

## 2019-09-20 ENCOUNTER — Other Ambulatory Visit: Payer: Self-pay

## 2019-09-20 ENCOUNTER — Inpatient Hospital Stay: Payer: Medicare HMO

## 2019-09-20 VITALS — BP 157/89 | HR 67 | Temp 96.9°F | Resp 18 | Ht 72.0 in | Wt 244.3 lb

## 2019-09-20 DIAGNOSIS — Z86718 Personal history of other venous thrombosis and embolism: Secondary | ICD-10-CM | POA: Diagnosis not present

## 2019-09-20 DIAGNOSIS — Z8249 Family history of ischemic heart disease and other diseases of the circulatory system: Secondary | ICD-10-CM | POA: Diagnosis not present

## 2019-09-20 DIAGNOSIS — R441 Visual hallucinations: Secondary | ICD-10-CM | POA: Diagnosis not present

## 2019-09-20 DIAGNOSIS — R0602 Shortness of breath: Secondary | ICD-10-CM | POA: Insufficient documentation

## 2019-09-20 DIAGNOSIS — I82511 Chronic embolism and thrombosis of right femoral vein: Secondary | ICD-10-CM

## 2019-09-20 DIAGNOSIS — Z7901 Long term (current) use of anticoagulants: Secondary | ICD-10-CM | POA: Diagnosis not present

## 2019-09-20 DIAGNOSIS — I252 Old myocardial infarction: Secondary | ICD-10-CM | POA: Diagnosis not present

## 2019-09-20 DIAGNOSIS — R600 Localized edema: Secondary | ICD-10-CM | POA: Diagnosis not present

## 2019-09-20 DIAGNOSIS — F0390 Unspecified dementia without behavioral disturbance: Secondary | ICD-10-CM | POA: Diagnosis not present

## 2019-09-20 DIAGNOSIS — K219 Gastro-esophageal reflux disease without esophagitis: Secondary | ICD-10-CM | POA: Diagnosis not present

## 2019-09-20 DIAGNOSIS — N1831 Chronic kidney disease, stage 3a: Secondary | ICD-10-CM | POA: Diagnosis not present

## 2019-09-20 DIAGNOSIS — D631 Anemia in chronic kidney disease: Secondary | ICD-10-CM | POA: Diagnosis not present

## 2019-09-20 DIAGNOSIS — Z79899 Other long term (current) drug therapy: Secondary | ICD-10-CM | POA: Diagnosis not present

## 2019-09-20 DIAGNOSIS — R634 Abnormal weight loss: Secondary | ICD-10-CM

## 2019-09-20 DIAGNOSIS — R531 Weakness: Secondary | ICD-10-CM | POA: Insufficient documentation

## 2019-09-20 DIAGNOSIS — D509 Iron deficiency anemia, unspecified: Secondary | ICD-10-CM

## 2019-09-20 DIAGNOSIS — Z791 Long term (current) use of non-steroidal anti-inflammatories (NSAID): Secondary | ICD-10-CM | POA: Insufficient documentation

## 2019-09-20 DIAGNOSIS — E538 Deficiency of other specified B group vitamins: Secondary | ICD-10-CM | POA: Diagnosis not present

## 2019-09-20 DIAGNOSIS — Z7951 Long term (current) use of inhaled steroids: Secondary | ICD-10-CM | POA: Diagnosis not present

## 2019-09-20 DIAGNOSIS — E039 Hypothyroidism, unspecified: Secondary | ICD-10-CM | POA: Diagnosis not present

## 2019-09-20 DIAGNOSIS — K5909 Other constipation: Secondary | ICD-10-CM | POA: Diagnosis not present

## 2019-09-20 DIAGNOSIS — R5383 Other fatigue: Secondary | ICD-10-CM | POA: Diagnosis not present

## 2019-09-20 DIAGNOSIS — N183 Chronic kidney disease, stage 3 unspecified: Secondary | ICD-10-CM | POA: Insufficient documentation

## 2019-09-20 DIAGNOSIS — I129 Hypertensive chronic kidney disease with stage 1 through stage 4 chronic kidney disease, or unspecified chronic kidney disease: Secondary | ICD-10-CM | POA: Insufficient documentation

## 2019-09-20 LAB — CBC WITH DIFFERENTIAL/PLATELET
Abs Immature Granulocytes: 0.01 10*3/uL (ref 0.00–0.07)
Basophils Absolute: 0 10*3/uL (ref 0.0–0.1)
Basophils Relative: 1 %
Eosinophils Absolute: 0.1 10*3/uL (ref 0.0–0.5)
Eosinophils Relative: 1 %
HCT: 34.1 % — ABNORMAL LOW (ref 36.0–46.0)
Hemoglobin: 10.8 g/dL — ABNORMAL LOW (ref 12.0–15.0)
Immature Granulocytes: 0 %
Lymphocytes Relative: 26 %
Lymphs Abs: 1.1 10*3/uL (ref 0.7–4.0)
MCH: 28.1 pg (ref 26.0–34.0)
MCHC: 31.7 g/dL (ref 30.0–36.0)
MCV: 88.6 fL (ref 80.0–100.0)
Monocytes Absolute: 0.4 10*3/uL (ref 0.1–1.0)
Monocytes Relative: 10 %
Neutro Abs: 2.6 10*3/uL (ref 1.7–7.7)
Neutrophils Relative %: 62 %
Platelets: 214 10*3/uL (ref 150–400)
RBC: 3.85 MIL/uL — ABNORMAL LOW (ref 3.87–5.11)
RDW: 14.8 % (ref 11.5–15.5)
WBC: 4.2 10*3/uL (ref 4.0–10.5)
nRBC: 0 % (ref 0.0–0.2)

## 2019-09-20 LAB — SEDIMENTATION RATE: Sed Rate: 29 mm/hr (ref 0–30)

## 2019-09-20 LAB — IRON AND TIBC
Iron: 41 ug/dL (ref 28–170)
Saturation Ratios: 12 % (ref 10.4–31.8)
TIBC: 330 ug/dL (ref 250–450)
UIBC: 289 ug/dL

## 2019-09-20 LAB — TSH: TSH: 2.44 u[IU]/mL (ref 0.350–4.500)

## 2019-09-20 LAB — RETICULOCYTES
Immature Retic Fract: 10.5 % (ref 2.3–15.9)
RBC.: 3.85 MIL/uL — ABNORMAL LOW (ref 3.87–5.11)
Retic Count, Absolute: 62 10*3/uL (ref 19.0–186.0)
Retic Ct Pct: 1.6 % (ref 0.4–3.1)

## 2019-09-20 LAB — FOLATE: Folate: 14.2 ng/mL (ref >5.9)

## 2019-09-20 LAB — VITAMIN B12: Vitamin B-12: 2210 pg/mL — ABNORMAL HIGH (ref 180–914)

## 2019-09-20 LAB — FERRITIN: Ferritin: 29 ng/mL (ref 11–307)

## 2019-09-20 NOTE — Progress Notes (Signed)
Burning with urnation at times.

## 2019-09-21 ENCOUNTER — Telehealth: Payer: Self-pay

## 2019-09-21 NOTE — Telephone Encounter (Signed)
-----   Message from Lequita Asal, MD sent at 09/21/2019  1:03 PM EST ----- Regarding: Please call patient  B12 is high.  How much B12 is she taking?  Please hold B12 for now.  She returns in 1 week for f/u and Venofer.  M ----- Message ----- From: Buel Ream, Lab In [place] Sent: 09/20/2019  10:20 AM EST To: Lequita Asal, MD

## 2019-09-21 NOTE — Telephone Encounter (Signed)
Spoke with the patient daughter Cloyd Stagers to inform her that Ms Helming B-12 was elevated and if she could hold the B-12 until she returns for her visit on 09/27/2019. The patient is currently taking 2,000 mcg daily x 2 weeks. Ms Cloyd Stagers was understanding and agreeable.

## 2019-09-23 NOTE — Progress Notes (Signed)
[facility]  [address] [place], [place] [address] Phone: [phone]  Fax: [phone]   Clinic Day:  09/27/2019  Referring physician: Tracie Harrier, MD  Chief Complaint: Nicole Bishop is a 86 y.o. female with stage III chronic kidney disease and anemia who is seen for review of work up and discussion regarding direction of therapy.   HPI: The patient was last seen in the hematology clinic on 09/20/2019.  At that time, she was fatigued. She denies any bleeding.  Appetite was modest.  She had lost 24 pounds in 15 months.  She was taking oral B12 1,000 mcg daily.  She was taking oral iron BID.  We discussed IV Venofer.   Work up revealed a hematocrit 34.1, hemoglobin 10.8, MCV 88.6, platelets 214,000, WBC 4,200. Ferritin was 29 with an iron saturation of 12% and a TIBC of 330. Retic was 1.6%. TSH was 2.440. Vitamin B12 was 2,210 and folate 14.2. Sed rate 29.   During the interim, she has felt "ok". Her weight is up 4 pounds. She denies any nausea or diarrhea. She likes to eat a late breakfast. Her daughter notes that she is eating well. She has no complaints today.   The patient and her daughter agreed to IV iron.   Past Medical History:  Diagnosis Date  . Anemia   . Anxiety   . Arthritis   . Chronic kidney disease   . Coronary artery disease   . Depression   . Dizziness   . Dyspnea   . Dysrhythmia    gets tachy if anxious or excited  . GERD (gastroesophageal reflux disease)   . History of hiatal hernia   . HOH (hard of hearing)   . Hypertension   . Hypothyroidism   . IDA (iron deficiency anemia) 12/14/2014  . Myocardial infarction ([place]) 1992  . Renal insufficiency   . Sleep apnea   . Tremors of nervous system     Past Surgical History:  Procedure Laterality Date  . CATARACT EXTRACTION W/PHACO Left 07/15/2017   Procedure: CATARACT EXTRACTION PHACO AND INTRAOCULAR LENS PLACEMENT (IOC)-LEFT;  Surgeon: Birder Robson, MD;   Location: [facility] ORS;  Service: Ophthalmology;  Laterality: Left;  [place] 00.36.1 AP% 13.9 CDE 5.02 Fluid Pack lot # [id number]  . CATARACT EXTRACTION W/PHACO Right 08/26/2017   Procedure: CATARACT EXTRACTION PHACO AND INTRAOCULAR LENS PLACEMENT (IOC);  Surgeon: Birder Robson, MD;  Location: [facility] ORS;  Service: Ophthalmology;  Laterality: Right;  [place] 00:41.0 AP% 15.3 CDE 6.27 FLUID PACK LOT # [id number]  . CHOLECYSTECTOMY    . COLONOSCOPY WITH PROPOFOL N/A 04/10/2015   Procedure: COLONOSCOPY WITH PROPOFOL;  Surgeon: Manya Silvas, MD;  Location: [facility] ENDOSCOPY;  Service: Endoscopy;  Laterality: N/A;  . ESOPHAGOGASTRODUODENOSCOPY (EGD) WITH PROPOFOL N/A 04/10/2015   Procedure: ESOPHAGOGASTRODUODENOSCOPY (EGD) WITH PROPOFOL;  Surgeon: Manya Silvas, MD;  Location: [facility] ENDOSCOPY;  Service: Endoscopy;  Laterality: N/A;  . fatty tumor removed on left shoulder    . gallbladdedr removed    . JOINT REPLACEMENT Bilateral    total knee replacements  . REPLACEMENT TOTAL KNEE BILATERAL Bilateral     Family History  Problem Relation Age of Onset  . Arthritis Mother   . Hypertension Mother   . Stroke Mother   . Hypertension Father   . Heart attack Father   . Breast cancer Neg Hx     Social History:  reports that she has never smoked. She has never used smokeless tobacco. She  reports that she does not drink alcohol or use drugs. She lives at home alone. The patient is accompanied by her daughter via Face time today.  Allergies:  Allergies  Allergen Reactions  . Penicillins Rash and Other (See Comments)    Has patient had a PCN reaction causing immediate rash, facial/tongue/throat swelling, SOB or lightheadedness with hypotension: Unknown Has patient had a PCN reaction causing severe rash involving mucus membranes or skin necrosis: Unknown Has patient had a PCN reaction that required hospitalization: Unknown Has patient had a PCN reaction occurring within the last 10 years: Unknown If all of the  above answers are "NO", then may proceed with Cephalosporin use.   . Nsaids Other (See Comments)    GI upset  . Rosuvastatin Other (See Comments)  . Atorvastatin Rash  . Celebrex [Celecoxib] Other (See Comments)    Constipation and stomach upset  . Felodipine Nausea Only  . Oxaprozin Nausea Only  . Rofecoxib Other (See Comments)    GI upset  . Simvastatin Rash    Current Medications: Current Outpatient Medications  Medication Sig Dispense Refill  . acetaminophen (TYLENOL) 650 MG CR tablet Take 650 mg by mouth every 8 (eight) hours as needed for pain.    Marland Kitchen amLODipine (NORVASC) 5 MG tablet Take 5 mg by mouth daily.    . benazepril (LOTENSIN) 5 MG tablet Take 5 mg by mouth daily.    . calcitRIOL (ROCALTROL) 0.25 MCG capsule Take 1 capsule by mouth daily.    . Cholecalciferol (VITAMIN D3) 2000 units capsule Take 4,000 Units by mouth daily.    . CYANOCOBALAMIN PO Take 1,000 mcg by mouth daily.    . diclofenac Sodium (VOLTAREN) 1 % GEL Apply 2 g topically as needed.     . docusate sodium (COLACE) 100 MG capsule Take 100 mg by mouth 2 (two) times daily.     Marland Kitchen esomeprazole (NEXIUM) 40 MG capsule Take 40 mg by mouth daily as needed (acid reflux).     . Ferrous Sulfate (SLOW FE) 142 (45 Fe) MG TBCR Take 1 tablet by mouth 2 (two) times daily.     Marland Kitchen lactulose (CONSTULOSE) 10 GM/15ML solution Take 10 g by mouth daily as needed for mild constipation.     Marland Kitchen loratadine (CLARITIN) 10 MG tablet Take 1 tablet (10 mg total) by mouth daily. (Patient taking differently: Take 10 mg by mouth daily as needed. ) 30 tablet 0  . metoprolol succinate (TOPROL-XL) 50 MG 24 hr tablet Take 1 tablet (50 mg total) by mouth daily. Take with or immediately following a meal. (Patient taking differently: Take 25 mg by mouth daily. Take with or immediately following a meal.) 30 tablet 0  . mometasone (NASONEX) 50 MCG/ACT nasal spray Place 2 sprays into the nose daily as needed (allergies).     . olmesartan (BENICAR) 20 MG  tablet Take 20 mg by mouth daily.     Marland Kitchen olopatadine (PATANOL) 0.1 % ophthalmic solution Place 1 drop into both eyes 2 (two) times daily as needed for allergies.     . polyethylene glycol (MIRALAX / GLYCOLAX) packet Take 17 g by mouth daily as needed for mild constipation or moderate constipation.    . risperiDONE (RISPERDAL) 0.25 MG tablet Take 1 tablet by mouth daily.    . rivaroxaban (XARELTO) 20 MG TABS tablet Take 1 tablet (20 mg total) by mouth daily with supper. 30 tablet 0   No current facility-administered medications for this visit.   Facility-Administered Medications Ordered  in Other Visits  Medication Dose Route Frequency Provider Last Rate Last Admin  . 0.9 %  sodium chloride infusion   Intravenous Continuous Lequita Asal, MD   Stopped at 02/06/18 1429    Review of Systems  Constitutional: Negative.  Negative for chills, diaphoresis, fever, malaise/fatigue and weight loss (up 4 pounds).       Feels "ok".   HENT: Positive for hearing loss. Negative for congestion, ear pain, nosebleeds, sinus pain and sore throat.        Rhinorrhea.  Eyes: Negative.  Negative for blurred vision, double vision and photophobia.  Respiratory: Negative.  Negative for cough, hemoptysis, sputum production and shortness of breath (exertional).   Cardiovascular: Negative.  Negative for chest pain, palpitations and leg swelling.  Gastrointestinal: Positive for constipation (occasional; on stool softeners). Negative for abdominal pain, blood in stool, diarrhea, heartburn, melena (dark stools on oral iron), nausea and vomiting.       Eating well.  Genitourinary: Negative.  Negative for dysuria, frequency, hematuria and urgency.  Musculoskeletal: Negative for back pain, joint pain (shoulder ), myalgias and neck pain.  Skin: Negative.  Negative for itching and rash.  Neurological: Negative for dizziness, tingling, sensory change, weakness and headaches (after COVID vaccine on 09/17/2019).    Endo/Heme/Allergies: Negative.  Does not bruise/bleed easily.  Psychiatric/Behavioral: Positive for hallucinations (dementia related) and memory loss. Negative for depression. The patient is not nervous/anxious and does not have insomnia.   All other systems reviewed and are negative.   Performance status (ECOG): 2  Vitals Blood pressure 128/64, pulse (!) 59, temperature (!) 97.5 F (36.4 C), temperature source Tympanic, resp. rate 18, height 6' (1.829 m), weight 248 lb 4.8 oz (112.6 kg), SpO2 99 %.   Physical Exam  Constitutional: She is oriented to person, place, and time. She appears well-developed and well-nourished.  Elderly woman sitting in a wheelchair in no acute distress.  HENT:  Head: Normocephalic and atraumatic.  Black beret.  Curly gray hair.  Mask.  Eyes: Pupils are equal, round, and reactive to light. Conjunctivae and EOM are normal. No scleral icterus.  Neck: No JVD present.  Cardiovascular: Normal rate, regular rhythm and normal heart sounds.  No murmur heard. Pulmonary/Chest: Effort normal and breath sounds normal. No respiratory distress. She has no wheezes. She has no rales. She exhibits no tenderness.  Abdominal: Soft. Bowel sounds are normal. She exhibits no distension and no mass. There is no abdominal tenderness. There is no rebound and no guarding.  Musculoskeletal:        General: No tenderness or edema.     Cervical back: Neck supple.  Neurological: She is alert and oriented to person, place, and time.  Skin: Skin is warm and dry. No rash noted. She is not diaphoretic. No erythema. No pallor.  Psychiatric: She has a normal mood and affect. Her behavior is normal. Judgment and thought content normal.  Nursing note and vitals reviewed.   No visits with results within 3 Day(s) from this visit.  Latest known visit with results is:  Office Visit on 09/20/2019  Component Date Value Ref Range Status  . Retic Ct Pct 09/20/2019 1.6  0.4 - 3.1 % Final  . RBC.  09/20/2019 3.85* 3.87 - 5.11 MIL/uL Final  . Retic Count, Absolute 09/20/2019 62.0  19.0 - 186.0 K/uL Final  . Immature Retic Fract 09/20/2019 10.5  2.3 - 15.9 % Final   Performed at [facility], [address] [place], [place] [address]  .  TSH 09/20/2019 2.440  0.350 - 4.500 uIU/mL Final   Comment: Performed by a 3rd Generation assay with a functional sensitivity of <=0.01 uIU/mL. Performed at [facility], [address] [place], [place] [address]   . Folate 09/20/2019 14.2  >5.9 ng/mL Final   Performed at [facility], [address] [place], [place] [address]  . Vitamin B-12 09/20/2019 2,210* 180 - 914 pg/mL Final   Comment: (NOTE) This assay is not validated for testing neonatal or myeloproliferative syndrome specimens for Vitamin B12 levels. Performed at [facility], [place] [address], [place], [place] [address]   . Sed Rate 09/20/2019 29  0 - 30 mm/hr Final   Performed at [facility], [address] [place], [place] [address]  . Iron 09/20/2019 41  28 - 170 ug/dL Final  . TIBC 09/20/2019 330  250 - 450 ug/dL Final  . Saturation Ratios 09/20/2019 12  10.4 - 31.8 % Final  . UIBC 09/20/2019 289  ug/dL Final   Performed at [facility], [address] [place], [place] [address]  . Ferritin 09/20/2019 29  11 - 307 ng/mL Final   Performed at [facility], [address] [place], [place] [address]  . WBC 09/20/2019 4.2  4.0 - 10.5 K/uL Final  . RBC 09/20/2019 3.85* 3.87 - 5.11 MIL/uL Final  . Hemoglobin 09/20/2019 10.8* 12.0 - 15.0 g/dL Final  . HCT 09/20/2019 34.1* 36.0 - 46.0 % Final  . MCV 09/20/2019 88.6  80.0 - 100.0 fL Final  . MCH 09/20/2019 28.1  26.0 - 34.0 pg Final  . MCHC 09/20/2019 31.7  30.0 - 36.0 g/dL Final  . RDW 09/20/2019 14.8  11.5 - 15.5 % Final  . Platelets 09/20/2019 214  150 - 400 K/uL Final  . nRBC 09/20/2019 0.0  0.0 - 0.2 % Final  . Neutrophils Relative % 09/20/2019 62  % Final  .  Neutro Abs 09/20/2019 2.6  1.7 - 7.7 K/uL Final  . Lymphocytes Relative 09/20/2019 26  % Final  . Lymphs Abs 09/20/2019 1.1  0.7 - 4.0 K/uL Final  . Monocytes Relative 09/20/2019 10  % Final  . Monocytes Absolute 09/20/2019 0.4  0.1 - 1.0 K/uL Final  . Eosinophils Relative 09/20/2019 1  % Final  . Eosinophils Absolute 09/20/2019 0.1  0.0 - 0.5 K/uL Final  . Basophils Relative 09/20/2019 1  % Final  . Basophils Absolute 09/20/2019 0.0  0.0 - 0.1 K/uL Final  . Immature Granulocytes 09/20/2019 0  % Final  . Abs Immature Granulocytes 09/20/2019 0.01  0.00 - 0.07 K/uL Final   Performed at [facility], [address] [place], [place] [address]    Assessment:  Nicole Bishop is a 86 y.o. female African American woman with stage III chronic kidney diseaseandiron deficiency. Hematocrit and MCV were normal on 09/03/2013.   She denies any GI bleeding. Dietis modest. EGDon 04/10/2015 revealed a large hiatal hernia and gastritis. Biopsy was unremarkable. Colonoscopyon 04/10/2015 revealed diverticulosis in the sigmoid, descending, transverse, and ascending colon. She had internal hemorrhoids.  She hasB12 deficiency. She received monthly injections at the [facility] (last 07/2016). She began oral B12in 08/2016. B12 and folate were normal on 02/03/2018. She is intolerant of oral ironsecondary to baseline chronic constipation.   Labs on 01/25/2016revealed a hematocrit of 30 with an MCV of 67. Ferritin was 6 (low) with a TIBC of 487 (high). Normal studies included direct [place], folic acid, Q59,  haptoglobin, erythropoietin, and SPEP.   Work up on 09/20/2019 revealed a hematocrit 34.1, hemoglobin 10.8, MCV 88.6, platelets 214,000, WBC 4,200. Ferritin was 29 with an iron saturation of 12% and a TIBC of 330. Retic was 1.6%. B12, folate, and TSH were normal.  Sed rate 29.   She has received weekly Venofer: 800 mg(10/21/2014 - 11/11/2014), 400 mg (03/03/2015 and  03/10/2015), 400 mg (10/20/2015 and 10/27/2015), 200 mg (04/16/2016), 600 mg (11/11/2016 - 11/25/2016), 600 mg (05/23/2017- 06/06/2017), 400 mg (02/06/2018 - 02/13/2018), and 200 mg (06/11/2018).She receives Venofer if her ferritin is < 30.   Ferritinhas been followed: 14 on 12/28/2014, 9 on 02/23/2015, 113 on 03/17/2015, 12 on 10/13/2015, 31 on 01/10/2016, 19 on 04/11/2016, 23 on 07/18/2016, 9 on 11/11/2016, 9 on 05/13/2017, 46 on 08/07/2017, 41 on 11/10/2017, 32 on 02/03/2018, 132 on 05/11/2018, 77 on 06/11/2018, and 29 on 09/20/2019. Goal ferritinis 100.  She was admitted to [facility] [place] 06/09/2018 -10/25/2019with a right lower extremity DVT.  Duplex revealed an occlusive thrombus in the proximal and mid right femoral vein with nonocclusive thrombus in the distal femoral vein and right popliteal vein.  She was discharged on Xarelto.   She received COVID vaccine on 09/17/2019.   Symptomatically, she feels "ok".  She has dark stool on oral iron.  Exam is stable.  Plan: 1.   Iron deficiency anemia             Hematocrit 34.1.  Hemoglobin 10.8.  MCV 88.6.   Ferritin 29.  She has been on oral iron BID.             Ferritin goal 100.             Discuss reinitiation of IV iron.   Patient and daughter in agreement.             Stop oral iron.  Venofer today and weekly x 1 (total 2). 2.   B12 deficiency  Patient on B12.  B12 level 2210 (180 -914).  B12 goal 400.  Hold B12 x 2 weeks then restart Monday, Wednesday, Friday.  Follow-up B12 level. 3.   RIGHTlower extremity DVT Patient remains on Xarelto. 4.   Stage III chronic kidney disease Creatinine 1.35 (CrCl 41 ml/min) on 09/09/2019. Consider Retacrit if hemoglobin less than 10 and iron stores replete. 5.   Weight loss             Appetite has improved.    Weight is up 4 pounds.    Continue to monitor. 6.   RTC in 6 weeks for labs (CBC, ferritin, B12). 7.   RTC in 3 months for MD assessment, labs (CBC with diff, BMP,  ferritin, iron studies- day before), and +/- Venofer.  I discussed the assessment and treatment plan with the patient.  The patient was provided an opportunity to ask questions and all were answered.  The patient agreed with the plan and demonstrated an understanding of the instructions.  The patient was advised to call back if the symptoms worsen or if the condition fails to improve as anticipated.   Lequita Asal, MD, PhD    09/27/2019, 10:43 AM  I, Selena Batten, am acting as scribe for [employer]. Mike Gip, MD, PhD.  I, Laranda Burkemper C. Mike Gip, MD, have reviewed the above documentation for accuracy and completeness, and I agree with the above.

## 2019-09-26 ENCOUNTER — Encounter: Payer: Self-pay | Admitting: Hematology and Oncology

## 2019-09-27 ENCOUNTER — Encounter: Payer: Self-pay | Admitting: Hematology and Oncology

## 2019-09-27 ENCOUNTER — Inpatient Hospital Stay: Payer: Medicare HMO

## 2019-09-27 ENCOUNTER — Inpatient Hospital Stay (HOSPITAL_BASED_OUTPATIENT_CLINIC_OR_DEPARTMENT_OTHER): Payer: Medicare HMO | Admitting: Hematology and Oncology

## 2019-09-27 ENCOUNTER — Other Ambulatory Visit: Payer: Self-pay

## 2019-09-27 VITALS — BP 173/87 | HR 58 | Resp 18

## 2019-09-27 VITALS — BP 128/64 | HR 59 | Temp 97.5°F | Resp 18 | Ht 72.0 in | Wt 248.3 lb

## 2019-09-27 DIAGNOSIS — I129 Hypertensive chronic kidney disease with stage 1 through stage 4 chronic kidney disease, or unspecified chronic kidney disease: Secondary | ICD-10-CM | POA: Diagnosis not present

## 2019-09-27 DIAGNOSIS — D509 Iron deficiency anemia, unspecified: Secondary | ICD-10-CM | POA: Diagnosis not present

## 2019-09-27 DIAGNOSIS — F0390 Unspecified dementia without behavioral disturbance: Secondary | ICD-10-CM | POA: Diagnosis not present

## 2019-09-27 DIAGNOSIS — R634 Abnormal weight loss: Secondary | ICD-10-CM

## 2019-09-27 DIAGNOSIS — D631 Anemia in chronic kidney disease: Secondary | ICD-10-CM | POA: Diagnosis not present

## 2019-09-27 DIAGNOSIS — E538 Deficiency of other specified B group vitamins: Secondary | ICD-10-CM | POA: Diagnosis not present

## 2019-09-27 DIAGNOSIS — R531 Weakness: Secondary | ICD-10-CM | POA: Diagnosis not present

## 2019-09-27 DIAGNOSIS — N183 Chronic kidney disease, stage 3 unspecified: Secondary | ICD-10-CM | POA: Diagnosis not present

## 2019-09-27 DIAGNOSIS — K5909 Other constipation: Secondary | ICD-10-CM | POA: Diagnosis not present

## 2019-09-27 DIAGNOSIS — R441 Visual hallucinations: Secondary | ICD-10-CM | POA: Diagnosis not present

## 2019-09-27 DIAGNOSIS — N1831 Chronic kidney disease, stage 3a: Secondary | ICD-10-CM | POA: Diagnosis not present

## 2019-09-27 MED ORDER — SODIUM CHLORIDE 0.9 % IV SOLN
Freq: Once | INTRAVENOUS | Status: AC
  Filled 2019-09-27: qty 250

## 2019-09-27 MED ORDER — IRON SUCROSE 20 MG/ML IV SOLN
200.0000 mg | Freq: Once | INTRAVENOUS | Status: AC
  Administered 2019-09-27: 200 mg via INTRAVENOUS

## 2019-09-27 NOTE — Patient Instructions (Signed)

## 2019-09-27 NOTE — Progress Notes (Signed)
No new changes noted today. The patient Name, DOB and Medication has been verified by the patient daughter.

## 2019-10-04 ENCOUNTER — Ambulatory Visit
Admission: EM | Admit: 2019-10-04 | Discharge: 2019-10-04 | Disposition: A | Discharge status: Home / Self Care | Payer: Medicare HMO | Attending: Family Medicine | Admitting: Family Medicine

## 2019-10-04 ENCOUNTER — Encounter: Payer: Self-pay | Admitting: Emergency Medicine

## 2019-10-04 ENCOUNTER — Other Ambulatory Visit: Payer: Self-pay

## 2019-10-04 ENCOUNTER — Inpatient Hospital Stay: Payer: Medicare HMO

## 2019-10-04 VITALS — BP 220/109 | HR 63 | Temp 97.9°F | Resp 18

## 2019-10-04 DIAGNOSIS — E538 Deficiency of other specified B group vitamins: Secondary | ICD-10-CM | POA: Diagnosis not present

## 2019-10-04 DIAGNOSIS — D631 Anemia in chronic kidney disease: Secondary | ICD-10-CM | POA: Diagnosis not present

## 2019-10-04 DIAGNOSIS — D509 Iron deficiency anemia, unspecified: Secondary | ICD-10-CM

## 2019-10-04 DIAGNOSIS — R030 Elevated blood-pressure reading, without diagnosis of hypertension: Secondary | ICD-10-CM

## 2019-10-04 DIAGNOSIS — F0390 Unspecified dementia without behavioral disturbance: Secondary | ICD-10-CM | POA: Diagnosis not present

## 2019-10-04 DIAGNOSIS — R441 Visual hallucinations: Secondary | ICD-10-CM | POA: Diagnosis not present

## 2019-10-04 DIAGNOSIS — N183 Chronic kidney disease, stage 3 unspecified: Secondary | ICD-10-CM | POA: Diagnosis not present

## 2019-10-04 DIAGNOSIS — K5909 Other constipation: Secondary | ICD-10-CM | POA: Diagnosis not present

## 2019-10-04 DIAGNOSIS — R531 Weakness: Secondary | ICD-10-CM | POA: Diagnosis not present

## 2019-10-04 DIAGNOSIS — I129 Hypertensive chronic kidney disease with stage 1 through stage 4 chronic kidney disease, or unspecified chronic kidney disease: Secondary | ICD-10-CM | POA: Diagnosis not present

## 2019-10-04 MED ORDER — SODIUM CHLORIDE 0.9 % IV SOLN
Freq: Once | INTRAVENOUS | Status: AC
  Filled 2019-10-04: qty 250

## 2019-10-04 MED ORDER — IRON SUCROSE 20 MG/ML IV SOLN
200.0000 mg | Freq: Once | INTRAVENOUS | Status: AC
  Administered 2019-10-04: 200 mg via INTRAVENOUS

## 2019-10-04 NOTE — ED Triage Notes (Signed)
Pt was sent over to [place] from [facility]. She was having an iron infusion and her BP kept going up. Ranged from 178-209/103/120. Pt denies Chest pain, shortness of breath, headache or dizziness. Denies numbness, tingling, or weakness. She had her 2nd iron infusion.

## 2019-10-04 NOTE — Progress Notes (Signed)
Patient here for IV infusion of iron.  BP elevated when patient arrived.  Waited for several minutes and rechecked.  BP down to 158/96. Iron therapy initiated.  Once iron infused, BP continued to climb.  MD notified and patient taken to Urgent Care.  Patient reports that she took her medication this morning and that she did eat bacon.  Patient's daughter was in her car in the parking lot and was able to meet [place] at the entrance to the Urgent Care.

## 2019-10-04 NOTE — Patient Instructions (Signed)

## 2019-10-04 NOTE — ED Provider Notes (Signed)
[facility]    CSN: [id number] Arrival date & time: 10/04/19  1458      History   Chief Complaint Chief Complaint  Patient presents with  . Hypertension    HPI Nicole Bishop is a 86 y.o. female.   Nicole Bishop presents with her daughter with complaints of elevated blood pressure readings today. She was getting her iron infusion at the cancer center and her BP was noted to be >376 systolic. She was referred to UC. She has no complaints and feels well. Denies chest pain , headache, vision changes, dizziness, new or worsening leg/ankle swelling, or shortness of breath . History of htn and takes amlodipine, metoprolol, and benicar. She takes these at night so hasn't taken them yet today. She is on a blood thinner xarelto. History  Of afib and dvt. She follows with her PCP with IM as well as nephrology for CKD.   ROS per HPI, negative if not otherwise mentioned.      Past Medical History:  Diagnosis Date  . Anemia   . Anxiety   . Arthritis   . Chronic kidney disease   . Coronary artery disease   . Depression   . Dizziness   . Dyspnea   . Dysrhythmia    gets tachy if anxious or excited  . GERD (gastroesophageal reflux disease)   . History of hiatal hernia   . HOH (hard of hearing)   . Hypertension   . Hypothyroidism   . IDA (iron deficiency anemia) 12/14/2014  . Myocardial infarction ([place]) 1992  . Renal insufficiency   . Sleep apnea   . Tremors of nervous system     Patient Active Problem List   Diagnosis Date Noted  . Weight loss 09/27/2019  . AAA (abdominal aortic aneurysm) without rupture ([place]) 05/24/2019  . Anemia in chronic kidney disease 05/06/2019  . Edema of lower extremity 05/06/2019  . Hyperparathyroidism due to renal insufficiency ([place]) 05/05/2019  . A-fib ([place]) 11/02/2018  . Chronic venous insufficiency 06/23/2018  . Lymphedema 06/23/2018  . DVT (deep venous thrombosis) ([place]) 06/09/2018  . Major depressive disorder, recurrent, mild  ([place]) 05/06/2018  . Abnormal gait 12/03/2017  . Chronic diastolic CHF (congestive heart failure), NYHA class 3 ([place]) 10/29/2017  . Mild dementia ([place]) 09/29/2017  . Sleep behavior disorder, REM 09/29/2017  . Visual hallucinations 07/16/2017  . SOB (shortness of breath) on exertion 10/30/2016  . Depression, major, single episode, complete remission ([place]) 08/03/2016  . Kidney failure 03/17/2015  . Chronic kidney disease (CKD), stage III (moderate) ([place]) 12/22/2014  . Anemia, iron deficiency 12/22/2014  . IDA (iron deficiency anemia) 12/14/2014  . Chronic constipation 06/03/2014  . Essential (primary) hypertension 05/03/2014  . Acid reflux 04/20/2014  . H/O: obesity 04/20/2014  . Airway hyperreactivity 04/20/2014  . Arthritis of shoulder region, degenerative 01/27/2014  . B12 deficiency 12/27/2013  . Dizziness 03/13/2012  . Back ache 11/04/2011  . Avitaminosis D 11/04/2011  . Combined fat and carbohydrate induced hyperlipemia 10/31/2011  . Absolute anemia 10/31/2011  . Depression 05/21/2011  . Hearing loss 05/21/2011    Past Surgical History:  Procedure Laterality Date  . CATARACT EXTRACTION W/PHACO Left 07/15/2017   Procedure: CATARACT EXTRACTION PHACO AND INTRAOCULAR LENS PLACEMENT (IOC)-LEFT;  Surgeon: Birder Robson, MD;  Location: [facility] ORS;  Service: Ophthalmology;  Laterality: Left;  [place] 00.36.1 AP% 13.9 CDE 5.02 Fluid Pack lot # [id number]  . CATARACT EXTRACTION W/PHACO Right 08/26/2017   Procedure: CATARACT EXTRACTION PHACO AND INTRAOCULAR  LENS PLACEMENT (IOC);  Surgeon: Birder Robson, MD;  Location: [facility] ORS;  Service: Ophthalmology;  Laterality: Right;  [place] 00:41.0 AP% 15.3 CDE 6.27 FLUID PACK LOT # [id number]  . CHOLECYSTECTOMY    . COLONOSCOPY WITH PROPOFOL N/A 04/10/2015   Procedure: COLONOSCOPY WITH PROPOFOL;  Surgeon: Manya Silvas, MD;  Location: [facility] ENDOSCOPY;  Service: Endoscopy;  Laterality: N/A;  . ESOPHAGOGASTRODUODENOSCOPY (EGD) WITH PROPOFOL N/A  04/10/2015   Procedure: ESOPHAGOGASTRODUODENOSCOPY (EGD) WITH PROPOFOL;  Surgeon: Manya Silvas, MD;  Location: [facility] ENDOSCOPY;  Service: Endoscopy;  Laterality: N/A;  . fatty tumor removed on left shoulder    . gallbladdedr removed    . JOINT REPLACEMENT Bilateral    total knee replacements  . REPLACEMENT TOTAL KNEE BILATERAL Bilateral     OB History   No obstetric history on file.      Home Medications    Prior to Admission medications   Medication Sig Start Date End Date Taking? Authorizing Provider  acetaminophen (TYLENOL) 650 MG CR tablet Take 650 mg by mouth every 8 (eight) hours as needed for pain.   Yes [provider]  amLODipine (NORVASC) 5 MG tablet Take 5 mg by mouth daily.   Yes [provider]  calcitRIOL (ROCALTROL) 0.25 MCG capsule Take 1 capsule by mouth daily. 09/25/17  Yes [provider]  Cholecalciferol (VITAMIN D3) 2000 units capsule Take 4,000 Units by mouth daily.   Yes [provider]  CYANOCOBALAMIN PO Take 1,000 mcg by mouth daily.   Yes [provider]  diclofenac Sodium (VOLTAREN) 1 % GEL Apply 2 g topically as needed.  07/27/19 07/26/20 Yes [provider]  docusate sodium (COLACE) 100 MG capsule Take 100 mg by mouth 2 (two) times daily.  06/03/14  Yes [provider]  esomeprazole (NEXIUM) 40 MG capsule Take 40 mg by mouth daily as needed (acid reflux).    Yes [provider]  Ferrous Sulfate (SLOW FE) 142 (45 Fe) MG TBCR Take 1 tablet by mouth 2 (two) times daily.    Yes [provider]  lactulose (CONSTULOSE) 10 GM/15ML solution Take 10 g by mouth daily as needed for mild constipation.  04/01/16  Yes [provider]  loratadine (CLARITIN) 10 MG tablet Take 1 tablet (10 mg total) by mouth daily. Patient taking differently: Take 10 mg by mouth daily as needed.  02/12/13  Yes Jackolyn Confer, MD  metoprolol succinate (TOPROL-XL) 50 MG 24 hr tablet Take 1 tablet (50 mg  total) by mouth daily. Take with or immediately following a meal. Patient taking differently: Take 25 mg by mouth daily. Take with or immediately following a meal. 06/12/18  Yes Mody, Sital, MD  mometasone (NASONEX) 50 MCG/ACT nasal spray Place 2 sprays into the nose daily as needed (allergies).    Yes [provider]  olmesartan (BENICAR) 20 MG tablet Take 20 mg by mouth daily.  09/09/19 09/08/20 Yes [provider]  olopatadine (PATANOL) 0.1 % ophthalmic solution Place 1 drop into both eyes 2 (two) times daily as needed for allergies.    Yes [provider]  polyethylene glycol (MIRALAX / GLYCOLAX) packet Take 17 g by mouth daily as needed for mild constipation or moderate constipation.   Yes [provider]  risperiDONE (RISPERDAL) 0.25 MG tablet Take 1 tablet by mouth daily. 07/27/19  Yes [provider]  rivaroxaban (XARELTO) 20 MG TABS tablet Take 1 tablet (20 mg total) by mouth daily with supper. 06/30/18  Yes Mody,  Sital, MD  benazepril (LOTENSIN) 5 MG tablet Take 5 mg by mouth daily.    [provider]    Family History Family History  Problem Relation Age of Onset  . Arthritis Mother   . Hypertension Mother   . Stroke Mother   . Hypertension Father   . Heart attack Father   . Breast cancer Neg Hx     Social History Social History   Tobacco Use  . Smoking status: Never Smoker  . Smokeless tobacco: Never Used  Substance Use Topics  . Alcohol use: No  . Drug use: No     Allergies   Penicillins, Nsaids, Rosuvastatin, Atorvastatin, Celebrex [celecoxib], Felodipine, Oxaprozin, Rofecoxib, and Simvastatin   Review of Systems Review of Systems   Physical Exam Triage Vital Signs ED Triage Vitals  Enc Vitals Group     BP 10/04/19 1537 (!) 185/94     Pulse Rate 10/04/19 1537 67     Resp 10/04/19 1537 18     Temp 10/04/19 1537 98.8 F (37.1 C)     Temp Source 10/04/19 1537 Oral     SpO2 10/04/19 1537 98 %     Weight  10/04/19 1531 244 lb (110.7 kg)     Height 10/04/19 1531 6\' 1"  (1.854 m)     Head Circumference --      Peak Flow --      Pain Score 10/04/19 1531 0     Pain Loc --      Pain Edu? --      Excl. in [place]? --    No data found.  Updated Vital Signs BP (!) 156/94 (BP Location: Right Arm)   Pulse 67   Temp 98.8 F (37.1 C) (Oral)   Resp 18   Ht 6\' 1"  (1.854 m)   Wt 244 lb (110.7 kg)   SpO2 98%   BMI 32.19 kg/m   Visual Acuity Right Eye Distance:   Left Eye Distance:   Bilateral Distance:    Right Eye Near:   Left Eye Near:    Bilateral Near:     Physical Exam Constitutional:      General: She is not in acute distress.    Appearance: She is well-developed.     Comments: In wheelchair   Cardiovascular:     Rate and Rhythm: Normal rate.     Comments: Scant bilateral ankle edema noted  Pulmonary:     Effort: Pulmonary effort is normal.  Skin:    General: Skin is warm and dry.  Neurological:     Mental Status: She is alert. Mental status is at baseline.      UC Treatments / Results  Labs (all labs ordered are listed, but only abnormal results are displayed) Labs Reviewed - No data to display  EKG   Radiology No results found.  Procedures Procedures (including critical care time)  Medications Ordered in UC Medications - No data to display  Initial Impression / Assessment and Plan / UC Course  I have reviewed the triage vital signs and the nursing notes.  Pertinent labs & imaging results that were available during my care of the patient were reviewed by me and considered in my medical decision making (see chart for details).     No acute complaints today, noted elevated BP's today while getting iron infusion. Patient missed container to provide urine sample and unable to urinate again. Repeat BP has trended down well to 156/94. She has a regular regimen of  medications for her blood pressure and is followed well by her PCP and nephrologist. Encouraged follow  up for recheck to see if this bp is trend and needs adjustment, or not. Return precautions provided. Patient and daughter verbalized understanding and agreeable to plan.   Final Clinical Impressions(s) / UC Diagnoses   Final diagnoses:  Elevated blood pressure reading     Discharge Instructions     Your blood pressure has trended down well here tonight which is reassuring, especially since you continue to feel well.  Please follow up with your internal medicine physician for recheck in the next two weeks to see if any medication adjustments are needed.  If you develop and symptoms- headache, vision changes, shortness of breath , dizziness, worsening of swelling, please seek evaluation.    ED Prescriptions    None     PDMP not reviewed this encounter.   Zigmund Gottron, NP 10/04/19 1709

## 2019-10-04 NOTE — Discharge Instructions (Signed)
Your blood pressure has trended down well here tonight which is reassuring, especially since you continue to feel well.  Please follow up with your internal medicine physician for recheck in the next two weeks to see if any medication adjustments are needed.  If you develop and symptoms- headache, vision changes, shortness of breath , dizziness, worsening of swelling, please seek evaluation.

## 2019-10-21 DIAGNOSIS — F22 Delusional disorders: Secondary | ICD-10-CM | POA: Diagnosis not present

## 2019-10-21 DIAGNOSIS — Z6834 Body mass index (BMI) 34.0-34.9, adult: Secondary | ICD-10-CM | POA: Diagnosis not present

## 2019-10-21 DIAGNOSIS — I5032 Chronic diastolic (congestive) heart failure: Secondary | ICD-10-CM | POA: Diagnosis not present

## 2019-10-21 DIAGNOSIS — I10 Essential (primary) hypertension: Secondary | ICD-10-CM | POA: Diagnosis not present

## 2019-10-21 DIAGNOSIS — F0151 Vascular dementia with behavioral disturbance: Secondary | ICD-10-CM | POA: Diagnosis not present

## 2019-10-21 DIAGNOSIS — E441 Mild protein-calorie malnutrition: Secondary | ICD-10-CM | POA: Diagnosis not present

## 2019-10-27 DIAGNOSIS — R441 Visual hallucinations: Secondary | ICD-10-CM | POA: Diagnosis not present

## 2019-10-27 DIAGNOSIS — M79674 Pain in right toe(s): Secondary | ICD-10-CM | POA: Diagnosis not present

## 2019-10-27 DIAGNOSIS — I5032 Chronic diastolic (congestive) heart failure: Secondary | ICD-10-CM | POA: Diagnosis not present

## 2019-10-27 DIAGNOSIS — F0151 Vascular dementia with behavioral disturbance: Secondary | ICD-10-CM | POA: Diagnosis not present

## 2019-10-27 DIAGNOSIS — B351 Tinea unguium: Secondary | ICD-10-CM | POA: Diagnosis not present

## 2019-10-27 DIAGNOSIS — I10 Essential (primary) hypertension: Secondary | ICD-10-CM | POA: Diagnosis not present

## 2019-10-27 DIAGNOSIS — Z6834 Body mass index (BMI) 34.0-34.9, adult: Secondary | ICD-10-CM | POA: Diagnosis not present

## 2019-10-27 DIAGNOSIS — F22 Delusional disorders: Secondary | ICD-10-CM | POA: Diagnosis not present

## 2019-10-27 DIAGNOSIS — M79675 Pain in left toe(s): Secondary | ICD-10-CM | POA: Diagnosis not present

## 2019-10-28 DIAGNOSIS — Z0000 Encounter for general adult medical examination without abnormal findings: Secondary | ICD-10-CM | POA: Diagnosis not present

## 2019-10-28 DIAGNOSIS — M19011 Primary osteoarthritis, right shoulder: Secondary | ICD-10-CM | POA: Diagnosis not present

## 2019-10-28 DIAGNOSIS — N183 Chronic kidney disease, stage 3 unspecified: Secondary | ICD-10-CM | POA: Diagnosis not present

## 2019-10-28 DIAGNOSIS — F0151 Vascular dementia with behavioral disturbance: Secondary | ICD-10-CM | POA: Diagnosis not present

## 2019-10-28 DIAGNOSIS — I130 Hypertensive heart and chronic kidney disease with heart failure and stage 1 through stage 4 chronic kidney disease, or unspecified chronic kidney disease: Secondary | ICD-10-CM | POA: Diagnosis not present

## 2019-10-28 DIAGNOSIS — F22 Delusional disorders: Secondary | ICD-10-CM | POA: Diagnosis not present

## 2019-10-28 DIAGNOSIS — M19012 Primary osteoarthritis, left shoulder: Secondary | ICD-10-CM | POA: Diagnosis not present

## 2019-10-28 DIAGNOSIS — R262 Difficulty in walking, not elsewhere classified: Secondary | ICD-10-CM | POA: Diagnosis not present

## 2019-10-28 DIAGNOSIS — I5032 Chronic diastolic (congestive) heart failure: Secondary | ICD-10-CM | POA: Diagnosis not present

## 2019-11-04 DIAGNOSIS — I5032 Chronic diastolic (congestive) heart failure: Secondary | ICD-10-CM | POA: Diagnosis not present

## 2019-11-04 DIAGNOSIS — N1832 Chronic kidney disease, stage 3b: Secondary | ICD-10-CM | POA: Diagnosis not present

## 2019-11-04 DIAGNOSIS — I2510 Atherosclerotic heart disease of native coronary artery without angina pectoris: Secondary | ICD-10-CM | POA: Diagnosis not present

## 2019-11-04 DIAGNOSIS — I130 Hypertensive heart and chronic kidney disease with heart failure and stage 1 through stage 4 chronic kidney disease, or unspecified chronic kidney disease: Secondary | ICD-10-CM | POA: Diagnosis not present

## 2019-11-04 DIAGNOSIS — I872 Venous insufficiency (chronic) (peripheral): Secondary | ICD-10-CM | POA: Diagnosis not present

## 2019-11-04 DIAGNOSIS — M8949 Other hypertrophic osteoarthropathy, multiple sites: Secondary | ICD-10-CM | POA: Diagnosis not present

## 2019-11-04 DIAGNOSIS — D631 Anemia in chronic kidney disease: Secondary | ICD-10-CM | POA: Diagnosis not present

## 2019-11-04 DIAGNOSIS — M159 Polyosteoarthritis, unspecified: Secondary | ICD-10-CM | POA: Diagnosis not present

## 2019-11-04 DIAGNOSIS — I890 Lymphedema, not elsewhere classified: Secondary | ICD-10-CM | POA: Diagnosis not present

## 2019-11-05 ENCOUNTER — Other Ambulatory Visit: Payer: Self-pay

## 2019-11-05 DIAGNOSIS — D509 Iron deficiency anemia, unspecified: Secondary | ICD-10-CM

## 2019-11-05 DIAGNOSIS — E538 Deficiency of other specified B group vitamins: Secondary | ICD-10-CM

## 2019-11-08 ENCOUNTER — Other Ambulatory Visit: Payer: Self-pay

## 2019-11-08 ENCOUNTER — Inpatient Hospital Stay: Payer: Medicare HMO | Attending: Hematology and Oncology

## 2019-11-08 ENCOUNTER — Other Ambulatory Visit: Payer: Medicare HMO

## 2019-11-08 ENCOUNTER — Ambulatory Visit: Payer: Medicare HMO

## 2019-11-08 DIAGNOSIS — M8949 Other hypertrophic osteoarthropathy, multiple sites: Secondary | ICD-10-CM | POA: Diagnosis not present

## 2019-11-08 DIAGNOSIS — I5032 Chronic diastolic (congestive) heart failure: Secondary | ICD-10-CM | POA: Diagnosis not present

## 2019-11-08 DIAGNOSIS — N1832 Chronic kidney disease, stage 3b: Secondary | ICD-10-CM | POA: Diagnosis not present

## 2019-11-08 DIAGNOSIS — D509 Iron deficiency anemia, unspecified: Secondary | ICD-10-CM | POA: Diagnosis not present

## 2019-11-08 DIAGNOSIS — I872 Venous insufficiency (chronic) (peripheral): Secondary | ICD-10-CM | POA: Diagnosis not present

## 2019-11-08 DIAGNOSIS — E538 Deficiency of other specified B group vitamins: Secondary | ICD-10-CM

## 2019-11-08 DIAGNOSIS — D631 Anemia in chronic kidney disease: Secondary | ICD-10-CM | POA: Diagnosis not present

## 2019-11-08 DIAGNOSIS — M159 Polyosteoarthritis, unspecified: Secondary | ICD-10-CM | POA: Diagnosis not present

## 2019-11-08 DIAGNOSIS — I890 Lymphedema, not elsewhere classified: Secondary | ICD-10-CM | POA: Diagnosis not present

## 2019-11-08 DIAGNOSIS — I130 Hypertensive heart and chronic kidney disease with heart failure and stage 1 through stage 4 chronic kidney disease, or unspecified chronic kidney disease: Secondary | ICD-10-CM | POA: Diagnosis not present

## 2019-11-08 DIAGNOSIS — I2510 Atherosclerotic heart disease of native coronary artery without angina pectoris: Secondary | ICD-10-CM | POA: Diagnosis not present

## 2019-11-08 LAB — CBC WITH DIFFERENTIAL/PLATELET
Abs Immature Granulocytes: 0.01 10*3/uL (ref 0.00–0.07)
Basophils Absolute: 0 10*3/uL (ref 0.0–0.1)
Basophils Relative: 1 %
Eosinophils Absolute: 0.1 10*3/uL (ref 0.0–0.5)
Eosinophils Relative: 2 %
HCT: 32.9 % — ABNORMAL LOW (ref 36.0–46.0)
Hemoglobin: 10.2 g/dL — ABNORMAL LOW (ref 12.0–15.0)
Immature Granulocytes: 0 %
Lymphocytes Relative: 35 %
Lymphs Abs: 1.2 10*3/uL (ref 0.7–4.0)
MCH: 27.5 pg (ref 26.0–34.0)
MCHC: 31 g/dL (ref 30.0–36.0)
MCV: 88.7 fL (ref 80.0–100.0)
Monocytes Absolute: 0.4 10*3/uL (ref 0.1–1.0)
Monocytes Relative: 11 %
Neutro Abs: 1.8 10*3/uL (ref 1.7–7.7)
Neutrophils Relative %: 51 %
Platelets: 190 10*3/uL (ref 150–400)
RBC: 3.71 MIL/uL — ABNORMAL LOW (ref 3.87–5.11)
RDW: 14.5 % (ref 11.5–15.5)
WBC: 3.6 10*3/uL — ABNORMAL LOW (ref 4.0–10.5)
nRBC: 0 % (ref 0.0–0.2)

## 2019-11-08 LAB — FERRITIN: Ferritin: 50 ng/mL (ref 11–307)

## 2019-11-08 LAB — VITAMIN B12: Vitamin B-12: 748 pg/mL (ref 180–914)

## 2019-11-10 DIAGNOSIS — I5032 Chronic diastolic (congestive) heart failure: Secondary | ICD-10-CM | POA: Diagnosis not present

## 2019-11-10 DIAGNOSIS — M159 Polyosteoarthritis, unspecified: Secondary | ICD-10-CM | POA: Diagnosis not present

## 2019-11-10 DIAGNOSIS — I890 Lymphedema, not elsewhere classified: Secondary | ICD-10-CM | POA: Diagnosis not present

## 2019-11-10 DIAGNOSIS — M8949 Other hypertrophic osteoarthropathy, multiple sites: Secondary | ICD-10-CM | POA: Diagnosis not present

## 2019-11-10 DIAGNOSIS — I2510 Atherosclerotic heart disease of native coronary artery without angina pectoris: Secondary | ICD-10-CM | POA: Diagnosis not present

## 2019-11-10 DIAGNOSIS — I872 Venous insufficiency (chronic) (peripheral): Secondary | ICD-10-CM | POA: Diagnosis not present

## 2019-11-10 DIAGNOSIS — D631 Anemia in chronic kidney disease: Secondary | ICD-10-CM | POA: Diagnosis not present

## 2019-11-10 DIAGNOSIS — I130 Hypertensive heart and chronic kidney disease with heart failure and stage 1 through stage 4 chronic kidney disease, or unspecified chronic kidney disease: Secondary | ICD-10-CM | POA: Diagnosis not present

## 2019-11-10 DIAGNOSIS — N1832 Chronic kidney disease, stage 3b: Secondary | ICD-10-CM | POA: Diagnosis not present

## 2019-11-11 DIAGNOSIS — I130 Hypertensive heart and chronic kidney disease with heart failure and stage 1 through stage 4 chronic kidney disease, or unspecified chronic kidney disease: Secondary | ICD-10-CM | POA: Diagnosis not present

## 2019-11-11 DIAGNOSIS — M159 Polyosteoarthritis, unspecified: Secondary | ICD-10-CM | POA: Diagnosis not present

## 2019-11-11 DIAGNOSIS — M8949 Other hypertrophic osteoarthropathy, multiple sites: Secondary | ICD-10-CM | POA: Diagnosis not present

## 2019-11-11 DIAGNOSIS — I5032 Chronic diastolic (congestive) heart failure: Secondary | ICD-10-CM | POA: Diagnosis not present

## 2019-11-11 DIAGNOSIS — I890 Lymphedema, not elsewhere classified: Secondary | ICD-10-CM | POA: Diagnosis not present

## 2019-11-11 DIAGNOSIS — N1832 Chronic kidney disease, stage 3b: Secondary | ICD-10-CM | POA: Diagnosis not present

## 2019-11-12 DIAGNOSIS — I890 Lymphedema, not elsewhere classified: Secondary | ICD-10-CM | POA: Diagnosis not present

## 2019-11-12 DIAGNOSIS — I872 Venous insufficiency (chronic) (peripheral): Secondary | ICD-10-CM | POA: Diagnosis not present

## 2019-11-12 DIAGNOSIS — I130 Hypertensive heart and chronic kidney disease with heart failure and stage 1 through stage 4 chronic kidney disease, or unspecified chronic kidney disease: Secondary | ICD-10-CM | POA: Diagnosis not present

## 2019-11-12 DIAGNOSIS — I5032 Chronic diastolic (congestive) heart failure: Secondary | ICD-10-CM | POA: Diagnosis not present

## 2019-11-12 DIAGNOSIS — M8949 Other hypertrophic osteoarthropathy, multiple sites: Secondary | ICD-10-CM | POA: Diagnosis not present

## 2019-11-12 DIAGNOSIS — D631 Anemia in chronic kidney disease: Secondary | ICD-10-CM | POA: Diagnosis not present

## 2019-11-12 DIAGNOSIS — N1832 Chronic kidney disease, stage 3b: Secondary | ICD-10-CM | POA: Diagnosis not present

## 2019-11-12 DIAGNOSIS — M159 Polyosteoarthritis, unspecified: Secondary | ICD-10-CM | POA: Diagnosis not present

## 2019-11-12 DIAGNOSIS — I2510 Atherosclerotic heart disease of native coronary artery without angina pectoris: Secondary | ICD-10-CM | POA: Diagnosis not present

## 2019-11-16 DIAGNOSIS — M8949 Other hypertrophic osteoarthropathy, multiple sites: Secondary | ICD-10-CM | POA: Diagnosis not present

## 2019-11-16 DIAGNOSIS — I130 Hypertensive heart and chronic kidney disease with heart failure and stage 1 through stage 4 chronic kidney disease, or unspecified chronic kidney disease: Secondary | ICD-10-CM | POA: Diagnosis not present

## 2019-11-16 DIAGNOSIS — M159 Polyosteoarthritis, unspecified: Secondary | ICD-10-CM | POA: Diagnosis not present

## 2019-11-16 DIAGNOSIS — I2510 Atherosclerotic heart disease of native coronary artery without angina pectoris: Secondary | ICD-10-CM | POA: Diagnosis not present

## 2019-11-16 DIAGNOSIS — I872 Venous insufficiency (chronic) (peripheral): Secondary | ICD-10-CM | POA: Diagnosis not present

## 2019-11-16 DIAGNOSIS — I890 Lymphedema, not elsewhere classified: Secondary | ICD-10-CM | POA: Diagnosis not present

## 2019-11-16 DIAGNOSIS — D631 Anemia in chronic kidney disease: Secondary | ICD-10-CM | POA: Diagnosis not present

## 2019-11-16 DIAGNOSIS — N1832 Chronic kidney disease, stage 3b: Secondary | ICD-10-CM | POA: Diagnosis not present

## 2019-11-16 DIAGNOSIS — I5032 Chronic diastolic (congestive) heart failure: Secondary | ICD-10-CM | POA: Diagnosis not present

## 2019-11-17 DIAGNOSIS — D631 Anemia in chronic kidney disease: Secondary | ICD-10-CM | POA: Diagnosis not present

## 2019-11-17 DIAGNOSIS — I130 Hypertensive heart and chronic kidney disease with heart failure and stage 1 through stage 4 chronic kidney disease, or unspecified chronic kidney disease: Secondary | ICD-10-CM | POA: Diagnosis not present

## 2019-11-17 DIAGNOSIS — I872 Venous insufficiency (chronic) (peripheral): Secondary | ICD-10-CM | POA: Diagnosis not present

## 2019-11-17 DIAGNOSIS — I2510 Atherosclerotic heart disease of native coronary artery without angina pectoris: Secondary | ICD-10-CM | POA: Diagnosis not present

## 2019-11-17 DIAGNOSIS — N1832 Chronic kidney disease, stage 3b: Secondary | ICD-10-CM | POA: Diagnosis not present

## 2019-11-17 DIAGNOSIS — M8949 Other hypertrophic osteoarthropathy, multiple sites: Secondary | ICD-10-CM | POA: Diagnosis not present

## 2019-11-17 DIAGNOSIS — I5032 Chronic diastolic (congestive) heart failure: Secondary | ICD-10-CM | POA: Diagnosis not present

## 2019-11-17 DIAGNOSIS — I890 Lymphedema, not elsewhere classified: Secondary | ICD-10-CM | POA: Diagnosis not present

## 2019-11-17 DIAGNOSIS — M159 Polyosteoarthritis, unspecified: Secondary | ICD-10-CM | POA: Diagnosis not present

## 2019-11-18 DIAGNOSIS — I2510 Atherosclerotic heart disease of native coronary artery without angina pectoris: Secondary | ICD-10-CM | POA: Diagnosis not present

## 2019-11-18 DIAGNOSIS — D631 Anemia in chronic kidney disease: Secondary | ICD-10-CM | POA: Diagnosis not present

## 2019-11-18 DIAGNOSIS — I890 Lymphedema, not elsewhere classified: Secondary | ICD-10-CM | POA: Diagnosis not present

## 2019-11-18 DIAGNOSIS — I130 Hypertensive heart and chronic kidney disease with heart failure and stage 1 through stage 4 chronic kidney disease, or unspecified chronic kidney disease: Secondary | ICD-10-CM | POA: Diagnosis not present

## 2019-11-18 DIAGNOSIS — M8949 Other hypertrophic osteoarthropathy, multiple sites: Secondary | ICD-10-CM | POA: Diagnosis not present

## 2019-11-18 DIAGNOSIS — I5032 Chronic diastolic (congestive) heart failure: Secondary | ICD-10-CM | POA: Diagnosis not present

## 2019-11-18 DIAGNOSIS — I872 Venous insufficiency (chronic) (peripheral): Secondary | ICD-10-CM | POA: Diagnosis not present

## 2019-11-18 DIAGNOSIS — N1832 Chronic kidney disease, stage 3b: Secondary | ICD-10-CM | POA: Diagnosis not present

## 2019-11-18 DIAGNOSIS — M159 Polyosteoarthritis, unspecified: Secondary | ICD-10-CM | POA: Diagnosis not present

## 2019-11-22 ENCOUNTER — Ambulatory Visit
Admission: RE | Admit: 2019-11-22 | Discharge: 2019-11-22 | Disposition: A | Discharge status: Home / Self Care | Payer: Medicare HMO | Source: Ambulatory Visit | Attending: Vascular Surgery | Admitting: Vascular Surgery

## 2019-11-22 ENCOUNTER — Other Ambulatory Visit: Payer: Self-pay

## 2019-11-22 DIAGNOSIS — I713 Abdominal aortic aneurysm, ruptured, unspecified: Secondary | ICD-10-CM

## 2019-11-22 DIAGNOSIS — I714 Abdominal aortic aneurysm, without rupture: Secondary | ICD-10-CM | POA: Diagnosis not present

## 2019-11-22 MED ORDER — IOHEXOL 350 MG/ML SOLN
100.0000 mL | Freq: Once | INTRAVENOUS | Status: AC | PRN
  Administered 2019-11-22: 75 mL via INTRAVENOUS

## 2019-11-23 DIAGNOSIS — I130 Hypertensive heart and chronic kidney disease with heart failure and stage 1 through stage 4 chronic kidney disease, or unspecified chronic kidney disease: Secondary | ICD-10-CM | POA: Diagnosis not present

## 2019-11-23 DIAGNOSIS — I890 Lymphedema, not elsewhere classified: Secondary | ICD-10-CM | POA: Diagnosis not present

## 2019-11-23 DIAGNOSIS — I5032 Chronic diastolic (congestive) heart failure: Secondary | ICD-10-CM | POA: Diagnosis not present

## 2019-11-23 DIAGNOSIS — I2510 Atherosclerotic heart disease of native coronary artery without angina pectoris: Secondary | ICD-10-CM | POA: Diagnosis not present

## 2019-11-23 DIAGNOSIS — M8949 Other hypertrophic osteoarthropathy, multiple sites: Secondary | ICD-10-CM | POA: Diagnosis not present

## 2019-11-23 DIAGNOSIS — N1832 Chronic kidney disease, stage 3b: Secondary | ICD-10-CM | POA: Diagnosis not present

## 2019-11-23 DIAGNOSIS — M159 Polyosteoarthritis, unspecified: Secondary | ICD-10-CM | POA: Diagnosis not present

## 2019-11-23 DIAGNOSIS — D631 Anemia in chronic kidney disease: Secondary | ICD-10-CM | POA: Diagnosis not present

## 2019-11-23 DIAGNOSIS — I872 Venous insufficiency (chronic) (peripheral): Secondary | ICD-10-CM | POA: Diagnosis not present

## 2019-11-24 DIAGNOSIS — I872 Venous insufficiency (chronic) (peripheral): Secondary | ICD-10-CM | POA: Diagnosis not present

## 2019-11-24 DIAGNOSIS — I2510 Atherosclerotic heart disease of native coronary artery without angina pectoris: Secondary | ICD-10-CM | POA: Diagnosis not present

## 2019-11-24 DIAGNOSIS — I130 Hypertensive heart and chronic kidney disease with heart failure and stage 1 through stage 4 chronic kidney disease, or unspecified chronic kidney disease: Secondary | ICD-10-CM | POA: Diagnosis not present

## 2019-11-24 DIAGNOSIS — M8949 Other hypertrophic osteoarthropathy, multiple sites: Secondary | ICD-10-CM | POA: Diagnosis not present

## 2019-11-24 DIAGNOSIS — M159 Polyosteoarthritis, unspecified: Secondary | ICD-10-CM | POA: Diagnosis not present

## 2019-11-24 DIAGNOSIS — D631 Anemia in chronic kidney disease: Secondary | ICD-10-CM | POA: Diagnosis not present

## 2019-11-24 DIAGNOSIS — I890 Lymphedema, not elsewhere classified: Secondary | ICD-10-CM | POA: Diagnosis not present

## 2019-11-24 DIAGNOSIS — I5032 Chronic diastolic (congestive) heart failure: Secondary | ICD-10-CM | POA: Diagnosis not present

## 2019-11-24 DIAGNOSIS — N1832 Chronic kidney disease, stage 3b: Secondary | ICD-10-CM | POA: Diagnosis not present

## 2019-11-25 ENCOUNTER — Ambulatory Visit (INDEPENDENT_AMBULATORY_CARE_PROVIDER_SITE_OTHER): Payer: Medicare HMO | Admitting: Vascular Surgery

## 2019-11-25 ENCOUNTER — Other Ambulatory Visit: Payer: Self-pay

## 2019-11-25 ENCOUNTER — Encounter (INDEPENDENT_AMBULATORY_CARE_PROVIDER_SITE_OTHER): Payer: Self-pay | Admitting: Vascular Surgery

## 2019-11-25 VITALS — BP 162/91 | HR 72 | Ht 72.0 in | Wt 241.0 lb

## 2019-11-25 DIAGNOSIS — I872 Venous insufficiency (chronic) (peripheral): Secondary | ICD-10-CM

## 2019-11-25 DIAGNOSIS — F22 Delusional disorders: Secondary | ICD-10-CM | POA: Diagnosis not present

## 2019-11-25 DIAGNOSIS — F0152 Vascular dementia, unspecified severity, with psychotic disturbance: Secondary | ICD-10-CM

## 2019-11-25 DIAGNOSIS — I714 Abdominal aortic aneurysm, without rupture, unspecified: Secondary | ICD-10-CM

## 2019-11-25 DIAGNOSIS — K219 Gastro-esophageal reflux disease without esophagitis: Secondary | ICD-10-CM

## 2019-11-25 DIAGNOSIS — F0151 Vascular dementia with behavioral disturbance: Secondary | ICD-10-CM | POA: Diagnosis not present

## 2019-11-25 DIAGNOSIS — I10 Essential (primary) hypertension: Secondary | ICD-10-CM

## 2019-11-25 NOTE — Progress Notes (Signed)
MRN : [id number]  Nicole Bishop is a 86 y.o. (27-Dec-1930) female who presents with chief complaint of  Chief Complaint  Patient presents with  . Follow-up    2mo follow up   .  History of Present Illness:  The patient returns to the office for surveillance of a known abdominal aortic aneurysm. Patient denies abdominal pain or back pain, no other abdominal complaints. No changes suggesting embolic episodes.   The patient's dementia continues to worsen and her overall condition is increasingly frail  Patient denies amaurosis fugax or TIA symptoms. There is no history of claudication or rest pain symptoms of the lower extremities. The patient denies angina or shortness of breath.   CT scan of the abdomen and pelvis shows the aorta and iliac arteries with an AAA measured 5.1 cm with 3.1 iliac artery aneurysms.  This is unchanged from the previous scan 6 months ago.   Current Meds  Medication Sig  . acetaminophen (TYLENOL) 650 MG CR tablet Take 650 mg by mouth every 8 (eight) hours as needed for pain.  Marland Kitchen amLODipine (NORVASC) 5 MG tablet Take 5 mg by mouth daily.  . benazepril (LOTENSIN) 5 MG tablet Take 5 mg by mouth daily.  . calcitRIOL (ROCALTROL) 0.25 MCG capsule Take 1 capsule by mouth daily.  . Cholecalciferol (VITAMIN D3) 2000 units capsule Take 4,000 Units by mouth daily.  . CYANOCOBALAMIN PO Take 1,000 mcg by mouth daily.  . diclofenac Sodium (VOLTAREN) 1 % GEL Apply 2 g topically as needed.   . docusate sodium (COLACE) 100 MG capsule Take 100 mg by mouth 2 (two) times daily.   Marland Kitchen esomeprazole (NEXIUM) 40 MG capsule Take 40 mg by mouth daily as needed (acid reflux).   . Ferrous Sulfate (SLOW FE) 142 (45 Fe) MG TBCR Take 1 tablet by mouth 2 (two) times daily.   Marland Kitchen lactulose (CONSTULOSE) 10 GM/15ML solution Take 10 g by mouth daily as needed for mild constipation.   Marland Kitchen loratadine (CLARITIN) 10 MG tablet Take 1 tablet (10 mg total) by mouth daily. (Patient taking differently: Take  10 mg by mouth daily as needed. )  . metoprolol succinate (TOPROL-XL) 50 MG 24 hr tablet Take 1 tablet (50 mg total) by mouth daily. Take with or immediately following a meal. (Patient taking differently: Take 25 mg by mouth daily. Take with or immediately following a meal.)  . mometasone (NASONEX) 50 MCG/ACT nasal spray Place 2 sprays into the nose daily as needed (allergies).   . olmesartan (BENICAR) 20 MG tablet Take 20 mg by mouth daily.   Marland Kitchen olopatadine (PATANOL) 0.1 % ophthalmic solution Place 1 drop into both eyes 2 (two) times daily as needed for allergies.   . polyethylene glycol (MIRALAX / GLYCOLAX) packet Take 17 g by mouth daily as needed for mild constipation or moderate constipation.  . risperiDONE (RISPERDAL) 0.25 MG tablet Take 1 tablet by mouth daily.  . rivaroxaban (XARELTO) 20 MG TABS tablet Take 1 tablet (20 mg total) by mouth daily with supper.    Past Medical History:  Diagnosis Date  . Anemia   . Anxiety   . Arthritis   . Chronic kidney disease   . Coronary artery disease   . Depression   . Dizziness   . Dyspnea   . Dysrhythmia    gets tachy if anxious or excited  . GERD (gastroesophageal reflux disease)   . History of hiatal hernia   . HOH (hard of hearing)   .  Hypertension   . Hypothyroidism   . IDA (iron deficiency anemia) 12/14/2014  . Myocardial infarction ([place]) 1992  . Renal insufficiency   . Sleep apnea   . Tremors of nervous system     Past Surgical History:  Procedure Laterality Date  . CATARACT EXTRACTION W/PHACO Left 07/15/2017   Procedure: CATARACT EXTRACTION PHACO AND INTRAOCULAR LENS PLACEMENT (IOC)-LEFT;  Surgeon: Birder Robson, MD;  Location: [facility] ORS;  Service: Ophthalmology;  Laterality: Left;  [place] 00.36.1 AP% 13.9 CDE 5.02 Fluid Pack lot # [id number]  . CATARACT EXTRACTION W/PHACO Right 08/26/2017   Procedure: CATARACT EXTRACTION PHACO AND INTRAOCULAR LENS PLACEMENT (IOC);  Surgeon: Birder Robson, MD;  Location: [facility] ORS;  Service:  Ophthalmology;  Laterality: Right;  [place] 00:41.0 AP% 15.3 CDE 6.27 FLUID PACK LOT # [id number]  . CHOLECYSTECTOMY    . COLONOSCOPY WITH PROPOFOL N/A 04/10/2015   Procedure: COLONOSCOPY WITH PROPOFOL;  Surgeon: Manya Silvas, MD;  Location: [facility] ENDOSCOPY;  Service: Endoscopy;  Laterality: N/A;  . ESOPHAGOGASTRODUODENOSCOPY (EGD) WITH PROPOFOL N/A 04/10/2015   Procedure: ESOPHAGOGASTRODUODENOSCOPY (EGD) WITH PROPOFOL;  Surgeon: Manya Silvas, MD;  Location: [facility] ENDOSCOPY;  Service: Endoscopy;  Laterality: N/A;  . fatty tumor removed on left shoulder    . gallbladdedr removed    . JOINT REPLACEMENT Bilateral    total knee replacements  . REPLACEMENT TOTAL KNEE BILATERAL Bilateral     Social History Social History   Tobacco Use  . Smoking status: Never Smoker  . Smokeless tobacco: Never Used  Substance Use Topics  . Alcohol use: No  . Drug use: No    Family History Family History  Problem Relation Age of Onset  . Arthritis Mother   . Hypertension Mother   . Stroke Mother   . Hypertension Father   . Heart attack Father   . Breast cancer Neg Hx     Allergies  Allergen Reactions  . Penicillins Rash and Other (See Comments)    Has patient had a PCN reaction causing immediate rash, facial/tongue/throat swelling, SOB or lightheadedness with hypotension: Unknown Has patient had a PCN reaction causing severe rash involving mucus membranes or skin necrosis: Unknown Has patient had a PCN reaction that required hospitalization: Unknown Has patient had a PCN reaction occurring within the last 10 years: Unknown If all of the above answers are "NO", then may proceed with Cephalosporin use.   . Nsaids Other (See Comments)    GI upset  . Rosuvastatin Other (See Comments)  . Atorvastatin Rash  . Celebrex [Celecoxib] Other (See Comments)    Constipation and stomach upset  . Felodipine Nausea Only  . Oxaprozin Nausea Only  . Rofecoxib Other (See Comments)    GI upset  .  Simvastatin Rash     REVIEW OF SYSTEMS (Negative unless checked)  Constitutional: [] Weight loss  [] Fever  [] Chills Cardiac: [] Chest pain   [] Chest pressure   [] Palpitations   [] Shortness of breath when laying flat   [] Shortness of breath with exertion. Vascular:  [] Pain in legs with walking   [] Pain in legs at rest  [] History of DVT   [] Phlebitis   [] Swelling in legs   [] Varicose veins   [] Non-healing ulcers Pulmonary:   [] Uses home oxygen   [] Productive cough   [] Hemoptysis   [] Wheeze  [] COPD   [] Asthma Neurologic:  [] Dizziness   [] Seizures   [] History of stroke   [] History of TIA  [] Aphasia   [] Vissual changes   [] Weakness or numbness in arm   [] Weakness  or numbness in leg Musculoskeletal:   [] Joint swelling   [] Joint pain   [] Low back pain Hematologic:  [] Easy bruising  [] Easy bleeding   [] Hypercoagulable state   [] Anemic Gastrointestinal:  [] Diarrhea   [] Vomiting  [] Gastroesophageal reflux/heartburn   [] Difficulty swallowing. Genitourinary:  [] Chronic kidney disease   [] Difficult urination  [] Frequent urination   [] Blood in urine Skin:  [] Rashes   [] Ulcers  Psychological:  [] History of anxiety   []  History of major depression.  Physical Examination  Vitals:   11/25/19 1550  BP: (!) 162/91  Pulse: 72  Weight: 241 lb (109.3 kg)  Height: 6' (1.829 m)   Body mass index is 32.69 kg/m. Gen: WD/WN, NAD frail-appearing Head: [place]/AT, No temporalis wasting.  Ear/Nose/Throat: Hearing grossly intact, nares w/o erythema or drainage Eyes: PER, EOMI, sclera nonicteric.  Neck: Supple, no large masses.   Pulmonary:  Good air movement, no audible wheezing bilaterally, no use of accessory muscles.  Cardiac: RRR, no JVD Vascular: scattered varicosities present bilaterally.  Moderate venous stasis changes to the legs bilaterally.  3+ soft pitting edema Vessel Right Left  Radial Palpable Palpable  Gastrointestinal: Non-distended. No guarding/no peritoneal signs.  Musculoskeletal: M/S 5/5  throughout.  No deformity or atrophy.  Neurologic: CN 2-12 intact. Symmetrical.  Speech is fluent. Motor exam as listed above. Psychiatric: Judgment intact, Mood & affect appropriate for pt's clinical situation. Dermatologic: Moderate venous rashes no ulcers noted.  No changes consistent with cellulitis. Lymph : No lichenification or skin changes of chronic lymphedema.  CBC Lab Results  Component Value Date   WBC 3.6 (L) 11/08/2019   HGB 10.2 (L) 11/08/2019   HCT 32.9 (L) 11/08/2019   MCV 88.7 11/08/2019   PLT 190 11/08/2019    BMET    Component Value Date/Time   NA 142 06/09/2018 0616   NA 135 11/21/2014 1301   K 3.7 06/09/2018 0616   K 4.1 11/21/2014 1301   CL 112 (H) 06/09/2018 0616   CL 105 11/21/2014 1301   CO2 24 06/09/2018 0616   CO2 23 11/21/2014 1301   GLUCOSE 94 06/09/2018 0616   GLUCOSE 102 (H) 11/21/2014 1301   BUN 18 06/09/2018 0616   BUN 15 11/21/2014 1301   CREATININE 0.94 06/09/2018 0616   CREATININE 1.08 (H) 11/21/2014 1301   CALCIUM 9.1 06/09/2018 0616   CALCIUM 9.0 11/21/2014 1301   GFRNONAA 54 (L) 06/09/2018 0616   GFRNONAA 48 (L) 11/21/2014 1301   GFRAA >60 06/09/2018 0616   GFRAA 56 (L) 11/21/2014 1301   CrCl cannot be calculated (Patient's most recent lab result is older than the maximum 21 days allowed.).  COAG Lab Results  Component Value Date   INR 1.20 06/09/2018   INR 1.1 11/21/2014    Radiology CT Angio Abd/Pel w/ and/or w/o  Result Date: 11/22/2019 CLINICAL DATA:  86 year old female with abdominal aortic aneurysm. EXAM: CTA ABDOMEN AND PELVIS WITHOUT AND WITH CONTRAST TECHNIQUE: Multidetector CT imaging of the abdomen and pelvis was performed using the standard protocol during bolus administration of intravenous contrast. Multiplanar reconstructed images and MIPs were obtained and reviewed to evaluate the vascular anatomy. CONTRAST:  59mL OMNIPAQUE IOHEXOL 350 MG/ML SOLN COMPARISON:  Prior CT scan of the abdomen and pelvis 05/14/2019;  remote prior CT scan 07/04/2008 FINDINGS: VASCULAR Aorta: Fusiform aneurysmal dilation of the infrarenal abdominal aorta with maximal transverse measurements of 5.1 x 4.9 cm (see coronal and sagittal reformatted images). No significant interval change compared to prior imaging from 05/14/2019. Heterogeneous atherosclerotic plaque  again noted throughout the tortuous abdominal aorta. Celiac: Calcified plaque at the origin of the celiac artery without significant stenosis. SMA: Patent without evidence of aneurysm, dissection, vasculitis or significant stenosis. Renals: Solitary renal arteries bilaterally. Heterogeneous calcified and fibrofatty atherosclerotic plaque results in high-grade stenosis of the proximal right renal artery. There may be minimal stenosis secondary to calcified plaque at the origin of the left renal artery. IMA: Patent without evidence of aneurysm, dissection, vasculitis or significant stenosis. Inflow: Scattered atherosclerotic plaque throughout the iliac systems without evidence of focal stenosis or occlusion. There is aneurysmal dilation of the right internal iliac artery measuring up to 3.3 cm. Proximal Outflow: Bilateral common femoral and visualized portions of the superficial and profunda femoral arteries are patent without evidence of aneurysm, dissection, vasculitis or significant stenosis. Veins: No focal venous abnormality. Review of the MIP images confirms the above findings. NON-VASCULAR Lower chest: Large sliding hiatal hernia. The visualized cardiac structures are enlarged. Respiratory motion artifact slightly limits evaluation for small pulmonary nodules. Trace dependent atelectasis. No focal consolidation, emphysematous changes or suspicious mass or nodule. Hepatobiliary: No significant enlargement in the size of a circumscribed low-attenuation cystic lesion arising from the left hepatic lobe dating back to 2009. There has been some interval development of calcification along the  walls of the structure. Stable small subcentimeter probable cyst also in the left hepatic lobe. The gallbladder is surgically absent. Marked dilation of the extrahepatic common bile duct to 2.2 cm is essentially unchanged. No evidence of choledocholithiasis. Pancreas: Unremarkable. No pancreatic ductal dilatation or surrounding inflammatory changes. Spleen: Normal in size without focal abnormality. Adrenals/Urinary Tract: Normal adrenal glands. No evidence of enhancing renal mass, hydronephrosis or nephrolithiasis. Scattered low-attenuation renal lesions bilaterally are too small for accurate characterization but statistically highly likely to represent benign cysts. A few small foci of renal cortical thinning are present consistent with areas of scarring. The ureters are unremarkable. The bladder is decompressed. Stomach/Bowel: Colonic diverticular disease without CT evidence of active inflammation. No evidence of obstruction or focal bowel wall thickening. Normal appendix in the right lower quadrant. The terminal ileum is unremarkable. Lymphatic: No suspicious lymphadenopathy. Reproductive: Uterus and bilateral adnexa are unremarkable. Other: No abdominal wall hernia or abnormality. No abdominopelvic ascites. Musculoskeletal: No acute or significant osseous findings. Multilevel degenerative disc disease. IMPRESSION: VASCULAR 1. No significant interval growth of the 5.1 cm infrarenal abdominal aortic aneurysm compared to 05/14/2019. Aortic Atherosclerosis (ICD10-170.0); Aortic aneurysm NOS (ICD10-I71.9). 2. Aneurysmal dilation of the right internal iliac artery measuring up to 3.1 cm. 3. High-grade stenosis of the proximal right renal artery. NON-VASCULAR 1. No acute abnormality within the abdomen or pelvis. 2. Incidental findings as above without significant interval change. Signed, Criselda Peaches, MD, [facility] Radiology Electronically Signed   By:  Jacqulynn Cadet M.D.   On: 11/22/2019 12:25     Assessment/Plan 1. AAA (abdominal aortic aneurysm) without rupture (HCC) No surgery or intervention at this time.  The patient is frail and has severe dementia and cardiac issues.  Given these associated conditions I have recommended that we not fix the aneurysm until it is at least 5.5 cm.  The patient has an asymptomatic abdominal aortic aneurysm that is greater than 4 cm but less than 5.5 cm in maximal diameter.  I have discussed the natural history of abdominal aortic aneurysm and the small risk of rupture for aneurysm less than 5 cm in size.  However, as these small aneurysms tend to enlarge over  time, continued surveillance with ultrasound or CT scan is mandatory.   I have also discussed optimizing medical management with hypertension and lipid control and the importance of abstinence from tobacco.    Should the patient develop new onset abdominal or back pain or signs of peripheral embolization they are instructed to seek medical attention immediately and to alert the physician providing care that they have an aneurysm.   The family voices their understanding. I have scheduled the patient to return in 6 months with an aortic duplex. - VAS US AORTA/IVC/ILIACS; Future  2. Chronic venous insufficiency No surgery or intervention at this point in time.    I have reviewed my discussion with the patient regarding venous insufficiency and secondary lymph edema and why it  causes symptoms. I have discussed with the patient the chronic skin changes that accompany these problems and the long term sequela such as ulceration and infection.  Patient will continue wearing graduated compression stockings class 1 (20-30 mmHg) on a daily basis a prescription was given to the patient to keep this updated. The patient will  put the stockings on first thing in the morning and removing them in the evening. The patient is instructed specifically not to sleep in  the stockings.  In addition, behavioral modification including elevation during the day will be continued.  Diet and salt restriction was also discussed.  Previous duplex ultrasound of the lower extremities shows normal deep venous system, superficial reflux was not present.   Following the review of the ultrasound the patient will follow up in 12 months to reassess the degree of swelling and the control that graduated compression is offering.   The patient can be assessed for a Lymph Pump at that time.  However, at this time the patient states they are satisfied with the control compression and elevation is yielding.    3. Essential (primary) hypertension Continue antihypertensive medications as already ordered, these medications have been reviewed and there are no changes at this time.   4. Dementia, vascular, with delusions ([place]) Her dementia continues to advance and her mental faculties are diminished.  This has major significance for any large surgeries and so we will use 5.5 cm as the point at which we would consider aneurysm repair.  5. Gastroesophageal reflux disease without esophagitis Continue PPI as already ordered, this medication has been reviewed and there are no changes at this time.  Avoidence of caffeine and alcohol  Moderate elevation of the head of the bed     Hortencia Pilar, MD  11/25/2019 3:58 PM

## 2019-11-30 DIAGNOSIS — M8949 Other hypertrophic osteoarthropathy, multiple sites: Secondary | ICD-10-CM | POA: Diagnosis not present

## 2019-11-30 DIAGNOSIS — I130 Hypertensive heart and chronic kidney disease with heart failure and stage 1 through stage 4 chronic kidney disease, or unspecified chronic kidney disease: Secondary | ICD-10-CM | POA: Diagnosis not present

## 2019-11-30 DIAGNOSIS — I5032 Chronic diastolic (congestive) heart failure: Secondary | ICD-10-CM | POA: Diagnosis not present

## 2019-11-30 DIAGNOSIS — I872 Venous insufficiency (chronic) (peripheral): Secondary | ICD-10-CM | POA: Diagnosis not present

## 2019-11-30 DIAGNOSIS — I890 Lymphedema, not elsewhere classified: Secondary | ICD-10-CM | POA: Diagnosis not present

## 2019-11-30 DIAGNOSIS — M159 Polyosteoarthritis, unspecified: Secondary | ICD-10-CM | POA: Diagnosis not present

## 2019-11-30 DIAGNOSIS — N1832 Chronic kidney disease, stage 3b: Secondary | ICD-10-CM | POA: Diagnosis not present

## 2019-11-30 DIAGNOSIS — D631 Anemia in chronic kidney disease: Secondary | ICD-10-CM | POA: Diagnosis not present

## 2019-11-30 DIAGNOSIS — I2510 Atherosclerotic heart disease of native coronary artery without angina pectoris: Secondary | ICD-10-CM | POA: Diagnosis not present

## 2019-12-01 ENCOUNTER — Encounter (INDEPENDENT_AMBULATORY_CARE_PROVIDER_SITE_OTHER): Payer: Self-pay | Admitting: Vascular Surgery

## 2019-12-02 DIAGNOSIS — R0602 Shortness of breath: Secondary | ICD-10-CM | POA: Diagnosis not present

## 2019-12-02 DIAGNOSIS — R42 Dizziness and giddiness: Secondary | ICD-10-CM | POA: Diagnosis not present

## 2019-12-02 DIAGNOSIS — E782 Mixed hyperlipidemia: Secondary | ICD-10-CM | POA: Diagnosis not present

## 2019-12-02 DIAGNOSIS — I480 Paroxysmal atrial fibrillation: Secondary | ICD-10-CM | POA: Diagnosis not present

## 2019-12-02 DIAGNOSIS — I5032 Chronic diastolic (congestive) heart failure: Secondary | ICD-10-CM | POA: Diagnosis not present

## 2019-12-02 DIAGNOSIS — I10 Essential (primary) hypertension: Secondary | ICD-10-CM | POA: Diagnosis not present

## 2019-12-04 DIAGNOSIS — I5032 Chronic diastolic (congestive) heart failure: Secondary | ICD-10-CM | POA: Diagnosis not present

## 2019-12-04 DIAGNOSIS — D631 Anemia in chronic kidney disease: Secondary | ICD-10-CM | POA: Diagnosis not present

## 2019-12-04 DIAGNOSIS — N1832 Chronic kidney disease, stage 3b: Secondary | ICD-10-CM | POA: Diagnosis not present

## 2019-12-04 DIAGNOSIS — I890 Lymphedema, not elsewhere classified: Secondary | ICD-10-CM | POA: Diagnosis not present

## 2019-12-04 DIAGNOSIS — I130 Hypertensive heart and chronic kidney disease with heart failure and stage 1 through stage 4 chronic kidney disease, or unspecified chronic kidney disease: Secondary | ICD-10-CM | POA: Diagnosis not present

## 2019-12-04 DIAGNOSIS — I872 Venous insufficiency (chronic) (peripheral): Secondary | ICD-10-CM | POA: Diagnosis not present

## 2019-12-04 DIAGNOSIS — M159 Polyosteoarthritis, unspecified: Secondary | ICD-10-CM | POA: Diagnosis not present

## 2019-12-04 DIAGNOSIS — M8949 Other hypertrophic osteoarthropathy, multiple sites: Secondary | ICD-10-CM | POA: Diagnosis not present

## 2019-12-04 DIAGNOSIS — I2510 Atherosclerotic heart disease of native coronary artery without angina pectoris: Secondary | ICD-10-CM | POA: Diagnosis not present

## 2019-12-08 DIAGNOSIS — N1832 Chronic kidney disease, stage 3b: Secondary | ICD-10-CM | POA: Diagnosis not present

## 2019-12-08 DIAGNOSIS — M8949 Other hypertrophic osteoarthropathy, multiple sites: Secondary | ICD-10-CM | POA: Diagnosis not present

## 2019-12-08 DIAGNOSIS — M159 Polyosteoarthritis, unspecified: Secondary | ICD-10-CM | POA: Diagnosis not present

## 2019-12-08 DIAGNOSIS — I872 Venous insufficiency (chronic) (peripheral): Secondary | ICD-10-CM | POA: Diagnosis not present

## 2019-12-08 DIAGNOSIS — I5032 Chronic diastolic (congestive) heart failure: Secondary | ICD-10-CM | POA: Diagnosis not present

## 2019-12-08 DIAGNOSIS — D631 Anemia in chronic kidney disease: Secondary | ICD-10-CM | POA: Diagnosis not present

## 2019-12-08 DIAGNOSIS — I890 Lymphedema, not elsewhere classified: Secondary | ICD-10-CM | POA: Diagnosis not present

## 2019-12-08 DIAGNOSIS — I130 Hypertensive heart and chronic kidney disease with heart failure and stage 1 through stage 4 chronic kidney disease, or unspecified chronic kidney disease: Secondary | ICD-10-CM | POA: Diagnosis not present

## 2019-12-08 DIAGNOSIS — I2510 Atherosclerotic heart disease of native coronary artery without angina pectoris: Secondary | ICD-10-CM | POA: Diagnosis not present

## 2019-12-09 DIAGNOSIS — N1832 Chronic kidney disease, stage 3b: Secondary | ICD-10-CM | POA: Diagnosis not present

## 2019-12-09 DIAGNOSIS — I5032 Chronic diastolic (congestive) heart failure: Secondary | ICD-10-CM | POA: Diagnosis not present

## 2019-12-09 DIAGNOSIS — I130 Hypertensive heart and chronic kidney disease with heart failure and stage 1 through stage 4 chronic kidney disease, or unspecified chronic kidney disease: Secondary | ICD-10-CM | POA: Diagnosis not present

## 2019-12-09 DIAGNOSIS — I890 Lymphedema, not elsewhere classified: Secondary | ICD-10-CM | POA: Diagnosis not present

## 2019-12-09 DIAGNOSIS — I872 Venous insufficiency (chronic) (peripheral): Secondary | ICD-10-CM | POA: Diagnosis not present

## 2019-12-09 DIAGNOSIS — I2510 Atherosclerotic heart disease of native coronary artery without angina pectoris: Secondary | ICD-10-CM | POA: Diagnosis not present

## 2019-12-09 DIAGNOSIS — D631 Anemia in chronic kidney disease: Secondary | ICD-10-CM | POA: Diagnosis not present

## 2019-12-09 DIAGNOSIS — M8949 Other hypertrophic osteoarthropathy, multiple sites: Secondary | ICD-10-CM | POA: Diagnosis not present

## 2019-12-09 DIAGNOSIS — M159 Polyosteoarthritis, unspecified: Secondary | ICD-10-CM | POA: Diagnosis not present

## 2019-12-14 DIAGNOSIS — D631 Anemia in chronic kidney disease: Secondary | ICD-10-CM | POA: Diagnosis not present

## 2019-12-14 DIAGNOSIS — I872 Venous insufficiency (chronic) (peripheral): Secondary | ICD-10-CM | POA: Diagnosis not present

## 2019-12-14 DIAGNOSIS — M8949 Other hypertrophic osteoarthropathy, multiple sites: Secondary | ICD-10-CM | POA: Diagnosis not present

## 2019-12-14 DIAGNOSIS — I5032 Chronic diastolic (congestive) heart failure: Secondary | ICD-10-CM | POA: Diagnosis not present

## 2019-12-14 DIAGNOSIS — I890 Lymphedema, not elsewhere classified: Secondary | ICD-10-CM | POA: Diagnosis not present

## 2019-12-14 DIAGNOSIS — M159 Polyosteoarthritis, unspecified: Secondary | ICD-10-CM | POA: Diagnosis not present

## 2019-12-14 DIAGNOSIS — I130 Hypertensive heart and chronic kidney disease with heart failure and stage 1 through stage 4 chronic kidney disease, or unspecified chronic kidney disease: Secondary | ICD-10-CM | POA: Diagnosis not present

## 2019-12-14 DIAGNOSIS — N1832 Chronic kidney disease, stage 3b: Secondary | ICD-10-CM | POA: Diagnosis not present

## 2019-12-14 DIAGNOSIS — I2510 Atherosclerotic heart disease of native coronary artery without angina pectoris: Secondary | ICD-10-CM | POA: Diagnosis not present

## 2019-12-17 ENCOUNTER — Inpatient Hospital Stay: Payer: Medicare HMO | Attending: Hematology and Oncology

## 2019-12-17 ENCOUNTER — Other Ambulatory Visit: Payer: Self-pay

## 2019-12-17 ENCOUNTER — Telehealth: Payer: Self-pay | Admitting: *Deleted

## 2019-12-17 ENCOUNTER — Encounter: Payer: Self-pay | Admitting: Hematology and Oncology

## 2019-12-17 DIAGNOSIS — N1831 Chronic kidney disease, stage 3a: Secondary | ICD-10-CM | POA: Diagnosis not present

## 2019-12-17 DIAGNOSIS — I10 Essential (primary) hypertension: Secondary | ICD-10-CM | POA: Diagnosis not present

## 2019-12-17 DIAGNOSIS — D631 Anemia in chronic kidney disease: Secondary | ICD-10-CM | POA: Diagnosis not present

## 2019-12-17 NOTE — Progress Notes (Signed)
No new changes noted today. The patient Name and DOB has been verified by phone today. 

## 2019-12-17 NOTE — Telephone Encounter (Signed)
Daughter Nicole Bishop called requesting a return call to discuss this mornings labs. [phone]  CBC with Differential/Platelet Order: [id number] Status:  Final result  Visible to patient:  No (inaccessible in MyChart)  Next appt:  12/20/2019 at 10:15 AM in Oncology (Lequita Asal, MD)  Dx:  Iron deficiency anemia, unspecified i...  Ref Range & Units 1 mo ago  WBC 4.0 - 10.5 K/uL 3.6Low    RBC 3.87 - 5.11 MIL/uL 3.71Low    Hemoglobin 12.0 - 15.0 g/dL 10.2Low    HCT 36.0 - 46.0 % 32.9Low    MCV 80.0 - 100.0 fL 88.7   MCH 26.0 - 34.0 pg 27.5   MCHC 30.0 - 36.0 g/dL 31.0   RDW 11.5 - 15.5 % 14.5   Platelets 150 - 400 K/uL 190   nRBC 0.0 - 0.2 % 0.0   Neutrophils Relative % % 51   Neutro Abs 1.7 - 7.7 K/uL 1.8   Lymphocytes Relative % 35   Lymphs Abs 0.7 - 4.0 K/uL 1.2   Monocytes Relative % 11   Monocytes Absolute 0.1 - 1.0 K/uL 0.4   Eosinophils Relative % 2   Eosinophils Absolute 0.0 - 0.5 K/uL 0.1   Basophils Relative % 1   Basophils Absolute 0.0 - 0.1 K/uL 0.0   Immature Granulocytes % 0   Abs Immature Granulocytes 0.00 - 0.07 K/uL 0.01   Comment: Performed at [facility], [address] [place], [place] [address]  Resulting Agency  [facility] CLIN LAB      Specimen Collected: 11/08/19 13:52 Last Resulted: 11/08/19 14:00     Lab Flowsheet   Order Details   View Encounter   Lab and Collection Details   Routing   Result History         Other Results from 11/08/2019  Vitamin B12  Status:  Final result  Visible to patient:  No (inaccessible in [place])  Next appt:  12/20/2019 at 10:15 AM in Oncology Lequita Asal, MD)  Dx:  B12 deficiency Order: [id number]  Ref Range & Units 1 mo ago  Vitamin B-12 180 - 914 pg/mL 748   Comment: (NOTE)  This assay is not validated for testing neonatal or  myeloproliferative syndrome specimens for Vitamin B12 levels.  Performed at [facility], [place] [address], [place], [place]  [address]   Resulting  Agency  [facility] CLIN LAB      Specimen Collected: 11/08/19 13:52 Last Resulted: 11/08/19 23:55     Lab Flowsheet   Order Details   View Encounter   Lab and Collection Details   Routing   Result History           Ferritin  Status:  Final result  Visible to patient:  No (inaccessible in MyChart)  Next appt:  12/20/2019 at 10:15 AM in Oncology (Lequita Asal, MD)  Dx:  Iron deficiency anemia, unspecified i... Order: [id number]  Ref Range & Units 1 mo ago  Ferritin 11 - 307 ng/mL 50   Comment: Performed at [facility], [place] [place],  [address]  Resulting Agency  [facility] CLIN LAB      Specimen Collected: 11/08/19 13:52 Last Resulted: 11/08/19 21:59

## 2019-12-19 NOTE — Progress Notes (Signed)
[facility]  [address] [place], [place] [address] Phone: [phone]  Fax: [phone]   Clinic Day:  12/20/2019  Referring physician: Tracie Harrier, MD  Chief Complaint: PAISLEA HATTON is a 86 y.o. female with stage III chronic kidney disease and anemia who is seen for 3 month assessment.   HPI: The patient was last seen in the hematology clinic on 09/27/2019. At that time, she felt "ok".  She had dark stool on oral iron.  Exam was stable.  Hematocrit was 34.1, hemoglobin 10.8, MCV 88.6, platelets 214,000, WBC 4200 with an ANC of 2600.  B12 was 2210.  Folate was 14.2.  Ferritin was 29 with an iron saturation of 12% and a TIBC of 330.  She was seen in the [facility] after her blood pressure kept rising (systolic > 626) during an iron infusion at the [facility] on 10/04/2019. She denied chest pain, shortness of breath, headache, or dizziness. Her blood pressure went down and she was released with instructions to follow up with her PCP.   She followed up with Dr. Tracie Harrier on 10/28/2019. Medications remain unchanged. Home Health nurse ordered.   She follows up with Dr. Holley Raring on 01/11/2020.   She does take an oral iron supplement, BID on Monday, Wednesday, and Friday first week and BID Tuesday, Thursday, Saturday the second week. She only takes it with water though. She is taking her B12 every other day.   She got her COVID-19 vaccination. She notes some fatigue as side effects.   She is able to do most of her daily activities by herself. She does have some home help to come help her bathe.    Past Medical History:  Diagnosis Date  . Anemia   . Anxiety   . Arthritis   . Chronic kidney disease   . Coronary artery disease   . Depression   . Dizziness   . Dyspnea   . Dysrhythmia    gets tachy if anxious or excited  . GERD (gastroesophageal reflux disease)   . History of hiatal hernia   . HOH (hard of hearing)   . Hypertension   .  Hypothyroidism   . IDA (iron deficiency anemia) 12/14/2014  . Myocardial infarction ([place]) 1992  . Renal insufficiency   . Sleep apnea   . Tremors of nervous system     Past Surgical History:  Procedure Laterality Date  . CATARACT EXTRACTION W/PHACO Left 07/15/2017   Procedure: CATARACT EXTRACTION PHACO AND INTRAOCULAR LENS PLACEMENT (IOC)-LEFT;  Surgeon: Birder Robson, MD;  Location: [facility] ORS;  Service: Ophthalmology;  Laterality: Left;  [place] 00.36.1 AP% 13.9 CDE 5.02 Fluid Pack lot # [id number]  . CATARACT EXTRACTION W/PHACO Right 08/26/2017   Procedure: CATARACT EXTRACTION PHACO AND INTRAOCULAR LENS PLACEMENT (IOC);  Surgeon: Birder Robson, MD;  Location: [facility] ORS;  Service: Ophthalmology;  Laterality: Right;  [place] 00:41.0 AP% 15.3 CDE 6.27 FLUID PACK LOT # [id number]  . CHOLECYSTECTOMY    . COLONOSCOPY WITH PROPOFOL N/A 04/10/2015   Procedure: COLONOSCOPY WITH PROPOFOL;  Surgeon: Manya Silvas, MD;  Location: [facility] ENDOSCOPY;  Service: Endoscopy;  Laterality: N/A;  . ESOPHAGOGASTRODUODENOSCOPY (EGD) WITH PROPOFOL N/A 04/10/2015   Procedure: ESOPHAGOGASTRODUODENOSCOPY (EGD) WITH PROPOFOL;  Surgeon: Manya Silvas, MD;  Location: [facility] ENDOSCOPY;  Service: Endoscopy;  Laterality: N/A;  . fatty tumor removed on left shoulder    . gallbladdedr removed    . JOINT REPLACEMENT Bilateral    total knee replacements  .  REPLACEMENT TOTAL KNEE BILATERAL Bilateral     Family History  Problem Relation Age of Onset  . Arthritis Mother   . Hypertension Mother   . Stroke Mother   . Hypertension Father   . Heart attack Father   . Breast cancer Neg Hx     Social History:  reports that she has never smoked. She has never used smokeless tobacco. She reports that she does not drink alcohol or use drugs. She lives at home alone. The patient is accompanied by her son via teleconference today.   Allergies:  Allergies  Allergen Reactions  . Penicillins Rash and Other (See Comments)    Has  patient had a PCN reaction causing immediate rash, facial/tongue/throat swelling, SOB or lightheadedness with hypotension: Unknown Has patient had a PCN reaction causing severe rash involving mucus membranes or skin necrosis: Unknown Has patient had a PCN reaction that required hospitalization: Unknown Has patient had a PCN reaction occurring within the last 10 years: Unknown If all of the above answers are "NO", then may proceed with Cephalosporin use.   . Nsaids Other (See Comments)    GI upset  . Rosuvastatin Other (See Comments)  . Atorvastatin Rash  . Celebrex [Celecoxib] Other (See Comments)    Constipation and stomach upset  . Felodipine Nausea Only  . Oxaprozin Nausea Only  . Rofecoxib Other (See Comments)    GI upset  . Simvastatin Rash    Current Medications: Current Outpatient Medications  Medication Sig Dispense Refill  . acetaminophen (TYLENOL) 650 MG CR tablet Take 650 mg by mouth every 8 (eight) hours as needed for pain.    Marland Kitchen amLODipine (NORVASC) 5 MG tablet Take 5 mg by mouth daily.    . benazepril (LOTENSIN) 5 MG tablet Take 5 mg by mouth daily.    . calcitRIOL (ROCALTROL) 0.25 MCG capsule Take 1 capsule by mouth daily.    . Cholecalciferol (VITAMIN D3) 2000 units capsule Take 4,000 Units by mouth daily.    . CYANOCOBALAMIN PO Take 1,000 mcg by mouth daily.    . diclofenac Sodium (VOLTAREN) 1 % GEL Apply 2 g topically as needed.     . docusate sodium (COLACE) 100 MG capsule Take 100 mg by mouth 2 (two) times daily.     Marland Kitchen esomeprazole (NEXIUM) 40 MG capsule Take 40 mg by mouth daily as needed (acid reflux).     . Ferrous Sulfate (SLOW FE) 142 (45 Fe) MG TBCR Take 1 tablet by mouth 2 (two) times daily.     Marland Kitchen lactulose (CONSTULOSE) 10 GM/15ML solution Take 10 g by mouth daily as needed for mild constipation.     Marland Kitchen loratadine (CLARITIN) 10 MG tablet Take 1 tablet (10 mg total) by mouth daily. (Patient taking differently: Take 10 mg by mouth daily as needed. ) 30 tablet 0   . metoprolol succinate (TOPROL-XL) 50 MG 24 hr tablet Take 1 tablet (50 mg total) by mouth daily. Take with or immediately following a meal. (Patient taking differently: Take 25 mg by mouth daily. Take with or immediately following a meal.) 30 tablet 0  . mometasone (NASONEX) 50 MCG/ACT nasal spray Place 2 sprays into the nose daily as needed (allergies).     . olmesartan (BENICAR) 20 MG tablet Take 20 mg by mouth daily.     Marland Kitchen olopatadine (PATANOL) 0.1 % ophthalmic solution Place 1 drop into both eyes 2 (two) times daily as needed for allergies.     . polyethylene glycol (MIRALAX /  GLYCOLAX) packet Take 17 g by mouth daily as needed for mild constipation or moderate constipation.    . risperiDONE (RISPERDAL) 0.25 MG tablet Take 1 tablet by mouth daily.    . rivaroxaban (XARELTO) 20 MG TABS tablet Take 1 tablet (20 mg total) by mouth daily with supper. 30 tablet 0   No current facility-administered medications for this visit.   Facility-Administered Medications Ordered in Other Visits  Medication Dose Route Frequency Provider Last Rate Last Admin  . 0.9 %  sodium chloride infusion   Intravenous Continuous Lequita Asal, MD   Stopped at 02/06/18 1429    Review of Systems  Constitutional: Negative.  Negative for chills, diaphoresis, fever, malaise/fatigue and weight loss (up 2 pounds).       Feels "ok".   HENT: Positive for hearing loss. Negative for congestion, ear pain, nosebleeds, sinus pain and sore throat.        Rhinorrhea.  Eyes: Negative.  Negative for blurred vision, double vision and photophobia.  Respiratory: Positive for shortness of breath (exertional). Negative for cough, hemoptysis and sputum production.   Cardiovascular: Negative.  Negative for chest pain, palpitations and leg swelling.  Gastrointestinal: Positive for constipation (occasional; on stool softeners). Negative for abdominal pain, blood in stool, diarrhea, heartburn, melena (dark stools on oral iron), nausea  and vomiting.       Eating well.  Genitourinary: Negative.  Negative for dysuria, frequency, hematuria and urgency.  Musculoskeletal: Negative for back pain, joint pain (shoulder ), myalgias and neck pain.  Skin: Negative.  Negative for itching and rash.  Neurological: Negative for dizziness, tingling, sensory change, weakness and headaches (after COVID vaccine on 09/17/2019).  Endo/Heme/Allergies: Negative.  Does not bruise/bleed easily.  Psychiatric/Behavioral: Positive for hallucinations (dementia related) and memory loss. Negative for depression. The patient is not nervous/anxious and does not have insomnia.   All other systems reviewed and are negative.   Performance status (ECOG): 2 - Symptomatic, <50% confined to bed   Vitals Blood pressure (!) 169/94, pulse 90, temperature 97.7 F (36.5 C), temperature source Tympanic, resp. rate 16, weight 243 lb 9.7 oz (110.5 kg), SpO2 93 %.   Physical Exam Vitals and nursing note reviewed.  Constitutional:      Appearance: She is well-developed and well-nourished. She is not diaphoretic.     Comments: Elderly woman sitting in a wheelchair in no acute distress.  HENT:     Head: Normocephalic and atraumatic.      Comments: Curly gray hair.  Mask. Eyes:     General: No scleral icterus.    Extraocular Movements: EOM normal.     Conjunctiva/sclera: Conjunctivae normal.     Pupils: Pupils are equal, round, and reactive to light.     Comments: Brown eyes.  Neck:     Vascular: No JVD.  Cardiovascular:     Rate and Rhythm: Normal rate and regular rhythm.     Heart sounds: Normal heart sounds. No murmur heard.   Pulmonary:     Effort: Pulmonary effort is normal. No respiratory distress.     Breath sounds: Normal breath sounds. No wheezing or rales.  Chest:     Chest wall: No tenderness.  Abdominal:     General: Bowel sounds are normal. There is no distension.     Palpations: Abdomen is soft. There is no mass.     Tenderness: There is no  abdominal tenderness. There is no guarding or rebound.  Musculoskeletal:        General:  Edema (+1 bilateral ankle) present. No tenderness.     Cervical back: Neck supple.  Skin:    General: Skin is warm and dry.     Coloration: Skin is not pale.     Findings: No erythema or rash.  Neurological:     Mental Status: She is alert and oriented to person, place, and time.  Psychiatric:        Mood and Affect: Mood and affect normal.        Behavior: Behavior normal.        Thought Content: Thought content normal.        Judgment: Judgment normal.     No visits with results within 3 Day(s) from this visit.  Latest known visit with results is:  Appointment on 11/08/2019  Component Date Value Ref Range Status  . Vitamin B-12 11/08/2019 748  180 - 914 pg/mL Final   Comment: (NOTE) This assay is not validated for testing neonatal or myeloproliferative syndrome specimens for Vitamin B12 levels. Performed at [facility], [place] [address] [place], [place] [address]   . Ferritin 11/08/2019 50  11 - 307 ng/mL Final   Performed at [facility], [address] [place], [place] [address]  . WBC 11/08/2019 3.6* 4.0 - 10.5 K/uL Final  . RBC 11/08/2019 3.71* 3.87 - 5.11 MIL/uL Final  . Hemoglobin 11/08/2019 10.2* 12.0 - 15.0 g/dL Final  . HCT 11/08/2019 32.9* 36.0 - 46.0 % Final  . MCV 11/08/2019 88.7  80.0 - 100.0 fL Final  . MCH 11/08/2019 27.5  26.0 - 34.0 pg Final  . MCHC 11/08/2019 31.0  30.0 - 36.0 g/dL Final  . RDW 11/08/2019 14.5  11.5 - 15.5 % Final  . Platelets 11/08/2019 190  150 - 400 K/uL Final  . nRBC 11/08/2019 0.0  0.0 - 0.2 % Final  . Neutrophils Relative % 11/08/2019 51  % Final  . Neutro Abs 11/08/2019 1.8  1.7 - 7.7 K/uL Final  . Lymphocytes Relative 11/08/2019 35  % Final  . Lymphs Abs 11/08/2019 1.2  0.7 - 4.0 K/uL Final  . Monocytes Relative 11/08/2019 11  % Final  . Monocytes Absolute 11/08/2019 0.4  0.1 - 1.0 K/uL Final  . Eosinophils Relative  11/08/2019 2  % Final  . Eosinophils Absolute 11/08/2019 0.1  0.0 - 0.5 K/uL Final  . Basophils Relative 11/08/2019 1  % Final  . Basophils Absolute 11/08/2019 0.0  0.0 - 0.1 K/uL Final  . Immature Granulocytes 11/08/2019 0  % Final  . Abs Immature Granulocytes 11/08/2019 0.01  0.00 - 0.07 K/uL Final   Performed at [facility], [address], [place], [place] [address]    Assessment:  NARIA ABBEY is a 86 y.o. female African American woman with stage III chronic kidney diseaseandiron deficiency. Hematocrit and MCV were normal on 09/03/2013.   She denies any GI bleeding. Dietis modest. EGDon 04/10/2015 revealed a large hiatal hernia and gastritis. Biopsy was unremarkable. Colonoscopyon 04/10/2015 revealed diverticulosis in the sigmoid, descending, transverse, and ascending colon. She had internal hemorrhoids.  She hasB12 deficiency. She received monthly injections at the [facility] (last 07/2016). She began oral B12in 08/2016. B12 and folate were normal on 02/03/2018. She is intolerant of oral ironsecondary to baseline chronic constipation.   Labs on 01/25/2016revealed a hematocrit of 30 with an MCV of 67. Ferritin was 6 (low) with a TIBC of 487 (high). Normal studies included direct Coombs, folic acid, S50, haptoglobin, erythropoietin,  and SPEP.   Work up on 09/20/2019 revealed a hematocrit 34.1, hemoglobin 10.8, MCV 88.6, platelets 214,000, WBC 4,200. Ferritin was 29 with an iron saturation of 12% and a TIBC of 330. Retic was 1.6%. B12, folate, and TSH were normal.  Sed rate 29.   She has received weekly Venofer: 800 mg(10/21/2014 - 11/11/2014), 400 mg (03/03/2015 and 03/10/2015), 400 mg (10/20/2015 and 10/27/2015), 200 mg (04/16/2016), 600 mg (11/11/2016 - 11/25/2016), 600 mg (05/23/2017- 06/06/2017), 400 mg (02/06/2018 - 02/13/2018), and 200 mg (06/11/2018), and 400 mg (09/27/2019 - 10/04/2019).She receives Venofer if her ferritin is < 30.    Ferritinhas been followed: 14 on 12/28/2014, 9 on 02/23/2015, 113 on 03/17/2015, 12 on 10/13/2015, 31 on 01/10/2016, 19 on 04/11/2016, 23 on 07/18/2016, 9 on 11/11/2016, 9 on 05/13/2017, 46 on 08/07/2017, 41 on 11/10/2017, 32 on 02/03/2018, 132 on 05/11/2018, 77 on 06/11/2018, 29 on 09/20/2019, and 50 on 11/08/2019. Goal ferritinis 100.  She was admitted to [facility] 06/09/2018 -10/25/2019with a right lower extremity DVT.  Duplex revealed an occlusive thrombus in the proximal and mid right femoral vein with nonocclusive thrombus in the distal femoral vein and right popliteal vein.  She was discharged on Xarelto.   She received COVID vaccine on 09/17/2019.   Symptomatically, she feels pretty good.  Exam is stable.  Plan: 1.   Labs today: CBC with diff, BMP, ferritin, iron studies 2.   Iron deficiency anemia             Hematocrit 33.5.  Hemoglobin 10.5.  MCV 88.4.   Ferritin 39.  She has been on oral iron 3 times a week.   Discuss increasing iron to daily as tolerated with OJ or vitamin C.              Ferritin goal 100.             She has received IV iron in the past (last 10/04/2019).             Continue to monitor. 3.   B12 deficiency  Patient on B12 three days a week.  B12 level 2210 on 09/20/2019 and 748 on 11/08/2019.  B12 goal 400.  Continue to monitor.  Check folate annually. 4.   RIGHTlower extremity DVT Patient remains on Xarelto. 5.   Stage III chronic kidney disease Creatinine 1.35 (CrCl 41 ml/min) on 09/09/2019. Consider Retacrit if hemoglobin < 10 and iron stores replete. 6.   Weight loss             Weight is up 2 pounds.    Continue to monitor. 7.   RTC in 3 months for labs (CBC with diff, ferritin, iron studies). 8.   RTC in 6 months for MD assessment, labs (CBC with diff, BMP, ferritin, iron studies).  I discussed the assessment and treatment plan with the patient.  The patient was provided an opportunity to ask questions and all were answered.  The  patient agreed with the plan and demonstrated an understanding of the instructions.  The patient was advised to call back if the symptoms worsen or if the condition fails to improve as anticipated.  I provided 15 minutes (11:11 AM - 11:26 AM) of face-to-face time during this this encounter and > 50% was spent counseling as documented under my assessment and plan.  An additional 5-6 minutes were spent reviewing her chart (Epic and Care Everywhere) including notes, labs, and imaging studies.    Lequita Asal, MD, PhD  12/20/2019, 11:26 AM  I, [employer], am acting as a Education administrator for [employer]. Mike Gip, MD.   I, Jamaris Biernat C. Mike Gip, MD, have reviewed the above documentation for accuracy and completeness, and I agree with the above.

## 2019-12-20 ENCOUNTER — Inpatient Hospital Stay: Payer: Medicare HMO | Attending: Hematology and Oncology | Admitting: Hematology and Oncology

## 2019-12-20 ENCOUNTER — Other Ambulatory Visit: Payer: Self-pay

## 2019-12-20 ENCOUNTER — Inpatient Hospital Stay: Payer: Medicare HMO

## 2019-12-20 ENCOUNTER — Encounter: Payer: Self-pay | Admitting: Hematology and Oncology

## 2019-12-20 VITALS — BP 169/94 | HR 90 | Temp 97.7°F | Resp 16 | Wt 243.6 lb

## 2019-12-20 DIAGNOSIS — I2510 Atherosclerotic heart disease of native coronary artery without angina pectoris: Secondary | ICD-10-CM | POA: Diagnosis not present

## 2019-12-20 DIAGNOSIS — I129 Hypertensive chronic kidney disease with stage 1 through stage 4 chronic kidney disease, or unspecified chronic kidney disease: Secondary | ICD-10-CM | POA: Insufficient documentation

## 2019-12-20 DIAGNOSIS — E538 Deficiency of other specified B group vitamins: Secondary | ICD-10-CM | POA: Diagnosis not present

## 2019-12-20 DIAGNOSIS — I130 Hypertensive heart and chronic kidney disease with heart failure and stage 1 through stage 4 chronic kidney disease, or unspecified chronic kidney disease: Secondary | ICD-10-CM | POA: Diagnosis not present

## 2019-12-20 DIAGNOSIS — K5909 Other constipation: Secondary | ICD-10-CM | POA: Insufficient documentation

## 2019-12-20 DIAGNOSIS — Z7901 Long term (current) use of anticoagulants: Secondary | ICD-10-CM | POA: Insufficient documentation

## 2019-12-20 DIAGNOSIS — D509 Iron deficiency anemia, unspecified: Secondary | ICD-10-CM

## 2019-12-20 DIAGNOSIS — I872 Venous insufficiency (chronic) (peripheral): Secondary | ICD-10-CM | POA: Diagnosis not present

## 2019-12-20 DIAGNOSIS — M8949 Other hypertrophic osteoarthropathy, multiple sites: Secondary | ICD-10-CM | POA: Diagnosis not present

## 2019-12-20 DIAGNOSIS — I890 Lymphedema, not elsewhere classified: Secondary | ICD-10-CM | POA: Diagnosis not present

## 2019-12-20 DIAGNOSIS — M159 Polyosteoarthritis, unspecified: Secondary | ICD-10-CM | POA: Diagnosis not present

## 2019-12-20 DIAGNOSIS — N183 Chronic kidney disease, stage 3 unspecified: Secondary | ICD-10-CM | POA: Insufficient documentation

## 2019-12-20 DIAGNOSIS — Z86718 Personal history of other venous thrombosis and embolism: Secondary | ICD-10-CM | POA: Diagnosis not present

## 2019-12-20 DIAGNOSIS — N1831 Chronic kidney disease, stage 3a: Secondary | ICD-10-CM

## 2019-12-20 DIAGNOSIS — I5032 Chronic diastolic (congestive) heart failure: Secondary | ICD-10-CM | POA: Diagnosis not present

## 2019-12-20 DIAGNOSIS — D631 Anemia in chronic kidney disease: Secondary | ICD-10-CM | POA: Diagnosis not present

## 2019-12-20 DIAGNOSIS — N1832 Chronic kidney disease, stage 3b: Secondary | ICD-10-CM | POA: Diagnosis not present

## 2019-12-20 LAB — CBC WITH DIFFERENTIAL/PLATELET
Abs Immature Granulocytes: 0.01 10*3/uL (ref 0.00–0.07)
Basophils Absolute: 0 10*3/uL (ref 0.0–0.1)
Basophils Relative: 1 %
Eosinophils Absolute: 0.1 10*3/uL (ref 0.0–0.5)
Eosinophils Relative: 2 %
HCT: 33.5 % — ABNORMAL LOW (ref 36.0–46.0)
Hemoglobin: 10.5 g/dL — ABNORMAL LOW (ref 12.0–15.0)
Immature Granulocytes: 0 %
Lymphocytes Relative: 29 %
Lymphs Abs: 1.2 10*3/uL (ref 0.7–4.0)
MCH: 27.7 pg (ref 26.0–34.0)
MCHC: 31.3 g/dL (ref 30.0–36.0)
MCV: 88.4 fL (ref 80.0–100.0)
Monocytes Absolute: 0.4 10*3/uL (ref 0.1–1.0)
Monocytes Relative: 9 %
Neutro Abs: 2.4 10*3/uL (ref 1.7–7.7)
Neutrophils Relative %: 59 %
Platelets: 202 10*3/uL (ref 150–400)
RBC: 3.79 MIL/uL — ABNORMAL LOW (ref 3.87–5.11)
RDW: 14.1 % (ref 11.5–15.5)
WBC: 4.1 10*3/uL (ref 4.0–10.5)
nRBC: 0 % (ref 0.0–0.2)

## 2019-12-20 LAB — IRON AND TIBC
Iron: 46 ug/dL (ref 28–170)
Saturation Ratios: 15 % (ref 10.4–31.8)
TIBC: 318 ug/dL (ref 250–450)
UIBC: 272 ug/dL

## 2019-12-20 LAB — SAMPLE TO BLOOD BANK

## 2019-12-20 LAB — FERRITIN: Ferritin: 39 ng/mL (ref 11–307)

## 2019-12-24 ENCOUNTER — Telehealth: Payer: Self-pay

## 2019-12-24 NOTE — Telephone Encounter (Signed)
Spoke with Ms Nicole Bishop to inform her that Ms [name] Ferritin levels was 39 today. Ms Nicole Bishop was understanding and agreeable.

## 2019-12-27 DIAGNOSIS — I890 Lymphedema, not elsewhere classified: Secondary | ICD-10-CM | POA: Diagnosis not present

## 2019-12-27 DIAGNOSIS — I2510 Atherosclerotic heart disease of native coronary artery without angina pectoris: Secondary | ICD-10-CM | POA: Diagnosis not present

## 2019-12-27 DIAGNOSIS — N1832 Chronic kidney disease, stage 3b: Secondary | ICD-10-CM | POA: Diagnosis not present

## 2019-12-27 DIAGNOSIS — M8949 Other hypertrophic osteoarthropathy, multiple sites: Secondary | ICD-10-CM | POA: Diagnosis not present

## 2019-12-27 DIAGNOSIS — D631 Anemia in chronic kidney disease: Secondary | ICD-10-CM | POA: Diagnosis not present

## 2019-12-27 DIAGNOSIS — I130 Hypertensive heart and chronic kidney disease with heart failure and stage 1 through stage 4 chronic kidney disease, or unspecified chronic kidney disease: Secondary | ICD-10-CM | POA: Diagnosis not present

## 2019-12-27 DIAGNOSIS — I5032 Chronic diastolic (congestive) heart failure: Secondary | ICD-10-CM | POA: Diagnosis not present

## 2019-12-27 DIAGNOSIS — I872 Venous insufficiency (chronic) (peripheral): Secondary | ICD-10-CM | POA: Diagnosis not present

## 2019-12-27 DIAGNOSIS — M159 Polyosteoarthritis, unspecified: Secondary | ICD-10-CM | POA: Diagnosis not present

## 2020-01-12 DIAGNOSIS — Z961 Presence of intraocular lens: Secondary | ICD-10-CM | POA: Diagnosis not present

## 2020-01-13 DIAGNOSIS — I10 Essential (primary) hypertension: Secondary | ICD-10-CM | POA: Diagnosis not present

## 2020-01-13 DIAGNOSIS — N1832 Chronic kidney disease, stage 3b: Secondary | ICD-10-CM | POA: Diagnosis not present

## 2020-01-13 DIAGNOSIS — N2581 Secondary hyperparathyroidism of renal origin: Secondary | ICD-10-CM | POA: Diagnosis not present

## 2020-01-13 DIAGNOSIS — R600 Localized edema: Secondary | ICD-10-CM | POA: Diagnosis not present

## 2020-01-13 DIAGNOSIS — I701 Atherosclerosis of renal artery: Secondary | ICD-10-CM | POA: Diagnosis not present

## 2020-01-13 DIAGNOSIS — D631 Anemia in chronic kidney disease: Secondary | ICD-10-CM | POA: Diagnosis not present

## 2020-02-25 ENCOUNTER — Telehealth: Payer: Self-pay | Admitting: *Deleted

## 2020-02-25 NOTE — Telephone Encounter (Signed)
Terrie called requesting that patient be seen due to weakness and finding patient in the floor twice having to be assisted up. She states she did not fall, but sat down due to weakness. Please advise

## 2020-02-25 NOTE — Telephone Encounter (Signed)
  Please try to reach her.  M

## 2020-02-25 NOTE — Telephone Encounter (Signed)
I have made several attempts to reach Nicole Bishop concerning [name], but it went straight to voicemail. Voicemail messages were left at both her home and cell phone numbers.

## 2020-02-28 ENCOUNTER — Inpatient Hospital Stay: Payer: Medicare HMO | Attending: Hospice and Palliative Medicine

## 2020-02-28 ENCOUNTER — Inpatient Hospital Stay (HOSPITAL_BASED_OUTPATIENT_CLINIC_OR_DEPARTMENT_OTHER): Payer: Medicare HMO | Admitting: Hospice and Palliative Medicine

## 2020-02-28 ENCOUNTER — Inpatient Hospital Stay: Payer: Medicare HMO

## 2020-02-28 ENCOUNTER — Other Ambulatory Visit: Payer: Self-pay

## 2020-02-28 VITALS — BP 119/78 | HR 70 | Resp 18

## 2020-02-28 DIAGNOSIS — I129 Hypertensive chronic kidney disease with stage 1 through stage 4 chronic kidney disease, or unspecified chronic kidney disease: Secondary | ICD-10-CM | POA: Insufficient documentation

## 2020-02-28 DIAGNOSIS — N1832 Chronic kidney disease, stage 3b: Secondary | ICD-10-CM | POA: Diagnosis not present

## 2020-02-28 DIAGNOSIS — I959 Hypotension, unspecified: Secondary | ICD-10-CM | POA: Insufficient documentation

## 2020-02-28 DIAGNOSIS — Z880 Allergy status to penicillin: Secondary | ICD-10-CM | POA: Insufficient documentation

## 2020-02-28 DIAGNOSIS — Z6833 Body mass index (BMI) 33.0-33.9, adult: Secondary | ICD-10-CM | POA: Diagnosis not present

## 2020-02-28 DIAGNOSIS — I252 Old myocardial infarction: Secondary | ICD-10-CM | POA: Diagnosis not present

## 2020-02-28 DIAGNOSIS — D509 Iron deficiency anemia, unspecified: Secondary | ICD-10-CM | POA: Insufficient documentation

## 2020-02-28 DIAGNOSIS — E860 Dehydration: Secondary | ICD-10-CM

## 2020-02-28 DIAGNOSIS — I890 Lymphedema, not elsewhere classified: Secondary | ICD-10-CM | POA: Diagnosis not present

## 2020-02-28 DIAGNOSIS — N179 Acute kidney failure, unspecified: Secondary | ICD-10-CM

## 2020-02-28 DIAGNOSIS — R000 Tachycardia, unspecified: Secondary | ICD-10-CM | POA: Insufficient documentation

## 2020-02-28 DIAGNOSIS — Z886 Allergy status to analgesic agent status: Secondary | ICD-10-CM | POA: Insufficient documentation

## 2020-02-28 DIAGNOSIS — E538 Deficiency of other specified B group vitamins: Secondary | ICD-10-CM | POA: Insufficient documentation

## 2020-02-28 DIAGNOSIS — Z79899 Other long term (current) drug therapy: Secondary | ICD-10-CM | POA: Diagnosis not present

## 2020-02-28 DIAGNOSIS — I2510 Atherosclerotic heart disease of native coronary artery without angina pectoris: Secondary | ICD-10-CM | POA: Diagnosis not present

## 2020-02-28 DIAGNOSIS — R262 Difficulty in walking, not elsewhere classified: Secondary | ICD-10-CM | POA: Diagnosis not present

## 2020-02-28 DIAGNOSIS — I10 Essential (primary) hypertension: Secondary | ICD-10-CM | POA: Diagnosis not present

## 2020-02-28 DIAGNOSIS — F0151 Vascular dementia with behavioral disturbance: Secondary | ICD-10-CM | POA: Diagnosis not present

## 2020-02-28 DIAGNOSIS — M8949 Other hypertrophic osteoarthropathy, multiple sites: Secondary | ICD-10-CM | POA: Diagnosis not present

## 2020-02-28 DIAGNOSIS — K219 Gastro-esophageal reflux disease without esophagitis: Secondary | ICD-10-CM | POA: Diagnosis not present

## 2020-02-28 DIAGNOSIS — Z7901 Long term (current) use of anticoagulants: Secondary | ICD-10-CM | POA: Diagnosis not present

## 2020-02-28 DIAGNOSIS — R7309 Other abnormal glucose: Secondary | ICD-10-CM | POA: Diagnosis not present

## 2020-02-28 DIAGNOSIS — R531 Weakness: Secondary | ICD-10-CM

## 2020-02-28 DIAGNOSIS — N183 Chronic kidney disease, stage 3 unspecified: Secondary | ICD-10-CM | POA: Diagnosis not present

## 2020-02-28 DIAGNOSIS — K5909 Other constipation: Secondary | ICD-10-CM | POA: Diagnosis not present

## 2020-02-28 DIAGNOSIS — H9190 Unspecified hearing loss, unspecified ear: Secondary | ICD-10-CM | POA: Diagnosis not present

## 2020-02-28 LAB — COMPREHENSIVE METABOLIC PANEL
ALT: 12 U/L (ref 0–44)
AST: 14 U/L — ABNORMAL LOW (ref 15–41)
Albumin: 3.7 g/dL (ref 3.5–5.0)
Alkaline Phosphatase: 57 U/L (ref 38–126)
Anion gap: 10 (ref 5–15)
BUN: 23 mg/dL (ref 8–23)
CO2: 22 mmol/L (ref 22–32)
Calcium: 9 mg/dL (ref 8.9–10.3)
Chloride: 107 mmol/L (ref 98–111)
Creatinine, Ser: 1.8 mg/dL — ABNORMAL HIGH (ref 0.44–1.00)
GFR calc Af Amer: 29 mL/min — ABNORMAL LOW (ref >60)
GFR calc non Af Amer: 25 mL/min — ABNORMAL LOW (ref >60)
Glucose, Bld: 99 mg/dL (ref 70–99)
Potassium: 4.4 mmol/L (ref 3.5–5.1)
Sodium: 139 mmol/L (ref 135–145)
Total Bilirubin: 0.7 mg/dL (ref 0.3–1.2)
Total Protein: 7 g/dL (ref 6.5–8.1)

## 2020-02-28 LAB — CBC WITH DIFFERENTIAL/PLATELET
Abs Immature Granulocytes: 0.01 10*3/uL (ref 0.00–0.07)
Basophils Absolute: 0 10*3/uL (ref 0.0–0.1)
Basophils Relative: 1 %
Eosinophils Absolute: 0.1 10*3/uL (ref 0.0–0.5)
Eosinophils Relative: 2 %
HCT: 32.9 % — ABNORMAL LOW (ref 36.0–46.0)
Hemoglobin: 10.7 g/dL — ABNORMAL LOW (ref 12.0–15.0)
Immature Granulocytes: 0 %
Lymphocytes Relative: 24 %
Lymphs Abs: 1 10*3/uL (ref 0.7–4.0)
MCH: 27.8 pg (ref 26.0–34.0)
MCHC: 32.5 g/dL (ref 30.0–36.0)
MCV: 85.5 fL (ref 80.0–100.0)
Monocytes Absolute: 0.4 10*3/uL (ref 0.1–1.0)
Monocytes Relative: 10 %
Neutro Abs: 2.6 10*3/uL (ref 1.7–7.7)
Neutrophils Relative %: 63 %
Platelets: 192 10*3/uL (ref 150–400)
RBC: 3.85 MIL/uL — ABNORMAL LOW (ref 3.87–5.11)
RDW: 14.8 % (ref 11.5–15.5)
WBC: 4.1 10*3/uL (ref 4.0–10.5)
nRBC: 0 % (ref 0.0–0.2)

## 2020-02-28 MED ORDER — SODIUM CHLORIDE 0.9 % IV SOLN
Freq: Once | INTRAVENOUS | Status: AC
  Filled 2020-02-28: qty 250

## 2020-02-28 NOTE — Telephone Encounter (Signed)
Spoke with patient's daughter via telephone. She is still weak and wants to bring her in to be evaluated. Offered her an appointment today at 1:00 pm for labs and evaluation in the symptom management clinic.

## 2020-02-28 NOTE — Progress Notes (Signed)
[facility] [place]  Telephone:([phone] [phone]  Patient Care Team: Tracie Harrier, MD as PCP - General (Internal Medicine) Manya Silvas, MD (Inactive) (Gastroenterology) Ok Edwards, NP as Nurse Practitioner (Gastroenterology) Anthonette Legato, MD (Internal Medicine) Marval Regal, NP as Nurse Practitioner (Nurse Practitioner)   Name of the patient: Nicole Bishop  [id number]  Jun 27, 1931   Date of visit: 02/28/20  Reason for Consult: Nicole Bishop is an 86 year old woman with multiple medical problems including CKD stage III, followed by hematology clinic for IDA and B12 deficiency, managed on oral iron.  Patient presents to the [facility] on 02/28/2020 for evaluation of weakness.  Patient lives at home alone is cared for by children.  Patient reports that she has not been eating or drinking much over the past couple weeks.  Daughter prepares mini water bottles with Crystal Light but patient is only drinking a few each day.  Patient endorses dry mouth.  Patient has had 3 falls in the past week.  Each time is associated with dizziness after she stands requiring her to either sit or slide to the floor.  No reported trauma.  She denies syncopal events.  No vertigo with movement of the head.  Denies any neurologic complaints. Denies recent fevers or illnesses. Denies any easy bleeding or bruising.  Denies chest pain. Denies any nausea, vomiting, or diarrhea.  She has chronic constipation.  Denies urinary complaints. Patient offers no further specific complaints today.  PAST MEDICAL HISTORY: Past Medical History:  Diagnosis Date  . Anemia   . Anxiety   . Arthritis   . Chronic kidney disease   . Coronary artery disease   . Depression   . Dizziness   . Dyspnea   . Dysrhythmia    gets tachy if anxious or excited  . GERD (gastroesophageal reflux disease)   . History of hiatal hernia   . HOH (hard of hearing)     . Hypertension   . Hypothyroidism   . IDA (iron deficiency anemia) 12/14/2014  . Myocardial infarction ([place]) 1992  . Renal insufficiency   . Sleep apnea   . Tremors of nervous system     PAST SURGICAL HISTORY:  Past Surgical History:  Procedure Laterality Date  . CATARACT EXTRACTION W/PHACO Left 07/15/2017   Procedure: CATARACT EXTRACTION PHACO AND INTRAOCULAR LENS PLACEMENT (IOC)-LEFT;  Surgeon: Birder Robson, MD;  Location: [facility] ORS;  Service: Ophthalmology;  Laterality: Left;  [place] 00.36.1 AP% 13.9 CDE 5.02 Fluid Pack lot # [id number]  . CATARACT EXTRACTION W/PHACO Right 08/26/2017   Procedure: CATARACT EXTRACTION PHACO AND INTRAOCULAR LENS PLACEMENT (IOC);  Surgeon: Birder Robson, MD;  Location: [facility] ORS;  Service: Ophthalmology;  Laterality: Right;  [place] 00:41.0 AP% 15.3 CDE 6.27 FLUID PACK LOT # [id number]  . CHOLECYSTECTOMY    . COLONOSCOPY WITH PROPOFOL N/A 04/10/2015   Procedure: COLONOSCOPY WITH PROPOFOL;  Surgeon: Manya Silvas, MD;  Location: [facility] ENDOSCOPY;  Service: Endoscopy;  Laterality: N/A;  . ESOPHAGOGASTRODUODENOSCOPY (EGD) WITH PROPOFOL N/A 04/10/2015   Procedure: ESOPHAGOGASTRODUODENOSCOPY (EGD) WITH PROPOFOL;  Surgeon: Manya Silvas, MD;  Location: [facility] ENDOSCOPY;  Service: Endoscopy;  Laterality: N/A;  . fatty tumor removed on left shoulder    . gallbladdedr removed    . JOINT REPLACEMENT Bilateral    total knee replacements  . REPLACEMENT TOTAL KNEE BILATERAL Bilateral     HEMATOLOGY/ONCOLOGY HISTORY:  Oncology History   No history exists.    ALLERGIES:  is allergic to  penicillins, nsaids, rosuvastatin, atorvastatin, celebrex [celecoxib], felodipine, oxaprozin, rofecoxib, and simvastatin.  MEDICATIONS:  Current Outpatient Medications  Medication Sig Dispense Refill  . acetaminophen (TYLENOL) 650 MG CR tablet Take 650 mg by mouth every 8 (eight) hours as needed for pain.    Marland Kitchen amLODipine (NORVASC) 5 MG tablet Take 5 mg by mouth daily.    .  benazepril (LOTENSIN) 5 MG tablet Take 5 mg by mouth daily.    . calcitRIOL (ROCALTROL) 0.25 MCG capsule Take 1 capsule by mouth daily.    . Cholecalciferol (VITAMIN D3) 2000 units capsule Take 4,000 Units by mouth daily.    . CYANOCOBALAMIN PO Take 1,000 mcg by mouth daily.    . diclofenac Sodium (VOLTAREN) 1 % GEL Apply 2 g topically as needed.     . docusate sodium (COLACE) 100 MG capsule Take 100 mg by mouth 2 (two) times daily.     Marland Kitchen esomeprazole (NEXIUM) 40 MG capsule Take 40 mg by mouth daily as needed (acid reflux).     . Ferrous Sulfate (SLOW FE) 142 (45 Fe) MG TBCR Take 1 tablet by mouth 2 (two) times daily.     Marland Kitchen lactulose (CONSTULOSE) 10 GM/15ML solution Take 10 g by mouth daily as needed for mild constipation.     Marland Kitchen loratadine (CLARITIN) 10 MG tablet Take 1 tablet (10 mg total) by mouth daily. (Patient taking differently: Take 10 mg by mouth daily as needed. ) 30 tablet 0  . metoprolol succinate (TOPROL-XL) 50 MG 24 hr tablet Take 1 tablet (50 mg total) by mouth daily. Take with or immediately following a meal. (Patient taking differently: Take 25 mg by mouth daily. Take with or immediately following a meal.) 30 tablet 0  . mometasone (NASONEX) 50 MCG/ACT nasal spray Place 2 sprays into the nose daily as needed (allergies).     . olmesartan (BENICAR) 20 MG tablet Take 20 mg by mouth daily.     Marland Kitchen olopatadine (PATANOL) 0.1 % ophthalmic solution Place 1 drop into both eyes 2 (two) times daily as needed for allergies.     . polyethylene glycol (MIRALAX / GLYCOLAX) packet Take 17 g by mouth daily as needed for mild constipation or moderate constipation.    . risperiDONE (RISPERDAL) 0.25 MG tablet Take 1 tablet by mouth daily.    . rivaroxaban (XARELTO) 20 MG TABS tablet Take 1 tablet (20 mg total) by mouth daily with supper. 30 tablet 0   No current facility-administered medications for this visit.   Facility-Administered Medications Ordered in Other Visits  Medication Dose Route  Frequency Provider Last Rate Last Admin  . 0.9 %  sodium chloride infusion   Intravenous Continuous Lequita Asal, MD   Stopped at 02/06/18 1429    VITAL SIGNS: There were no vitals taken for this visit. There were no vitals filed for this visit.  Estimated body mass index is 33.04 kg/m as calculated from the following:   Height as of 11/25/19: 6' (1.829 m).   Weight as of 12/20/19: 243 lb 9.7 oz (110.5 kg).  LABS: CBC:    Component Value Date/Time   WBC 4.1 12/20/2019 1131   HGB 10.5 (L) 12/20/2019 1131   HGB 10.7 (L) 11/21/2014 0848   HCT 33.5 (L) 12/20/2019 1131   HCT 34.7 (L) 11/21/2014 0848   PLT 202 12/20/2019 1131   PLT 196 11/21/2014 0848   MCV 88.4 12/20/2019 1131   MCV 73 (L) 11/21/2014 0848   NEUTROABS 2.4 12/20/2019 1131  NEUTROABS 2.8 11/21/2014 0848   LYMPHSABS 1.2 12/20/2019 1131   LYMPHSABS 1.1 11/21/2014 0848   MONOABS 0.4 12/20/2019 1131   MONOABS 0.4 11/21/2014 0848   EOSABS 0.1 12/20/2019 1131   EOSABS 0.1 11/21/2014 0848   BASOSABS 0.0 12/20/2019 1131   BASOSABS 0.1 11/21/2014 0848   Comprehensive Metabolic Panel:    Component Value Date/Time   NA 142 06/09/2018 0616   NA 135 11/21/2014 1301   K 3.7 06/09/2018 0616   K 4.1 11/21/2014 1301   CL 112 (H) 06/09/2018 0616   CL 105 11/21/2014 1301   CO2 24 06/09/2018 0616   CO2 23 11/21/2014 1301   BUN 18 06/09/2018 0616   BUN 15 11/21/2014 1301   CREATININE 0.94 06/09/2018 0616   CREATININE 1.08 (H) 11/21/2014 1301   GLUCOSE 94 06/09/2018 0616   GLUCOSE 102 (H) 11/21/2014 1301   CALCIUM 9.1 06/09/2018 0616   CALCIUM 9.0 11/21/2014 1301   AST 14 (L) 04/30/2018 1602   AST 18 11/21/2014 1301   ALT 9 04/30/2018 1602   ALT 13 (L) 11/21/2014 1301   ALKPHOS 55 04/30/2018 1602   ALKPHOS 74 11/21/2014 1301   BILITOT 0.5 04/30/2018 1602   BILITOT 0.6 11/21/2014 1301   PROT 6.9 04/30/2018 1602   PROT 7.1 11/21/2014 1301   ALBUMIN 3.3 (L) 04/30/2018 1602   ALBUMIN 3.9 11/21/2014 1301     RADIOGRAPHIC STUDIES: No results found.  PERFORMANCE STATUS (ECOG) : 2 - Symptomatic, <50% confined to bed  Review of Systems Unless otherwise noted, a complete review of systems is negative.  Physical Exam General: NAD Cardiovascular: regular rate and rhythm Pulmonary: clear anterior/posterior fields Abdomen: soft, nontender, + bowel sounds GU: no suprapubic tenderness Extremities: no edema, no joint deformities Skin: no rashes Neurological: Weakness but otherwise nonfocal  Assessment and Plan- Patient is a 86 y.o. female with CKD stage III, followed by hematology clinic for IDA and B12 deficiency, managed on oral iron.  Patient presents to the [facility] on 02/28/2020 for evaluation of weakness.  Patient is accompanied by her daughter.  Weakness/dehydration -labs were grossly unchanged.  Serum creatinine up slightly most likely secondary to dehydration.  Patient did demonstrate hypotension/tachycardia with position change from sitting to standing.  Will give normal saline 500 mL x 1.  She did drink a bottle of water while in the clinic today.  Encouraged oral fluids.  Will order home health PT/OT for eval and management of weakness (referral called to Floydene Flock with [facility]).    Will defer antihypertensives to PCP. Patient is scheduled to see PCP (Dr. Ginette Pitman) on 03/02/2020.  Will contact his office to see if he wants to see her sooner.    Constipation -recommended daily MiraLAX with as needed senna  Dementia -it is possible that the poor oral intake is related to patient's  memory impairment.  Discussed with daughter.  Patient will likely require more help over time as her cognition changes.  Will refer to home-based palliative care.    Patient expressed understanding and was in agreement with this plan. She also understands that She can call clinic at any time with any questions, concerns, or complaints.   Thank you for allowing me to participate in the care of this very  pleasant patient.   Time Total: 30 minutes  Visit consisted of counseling and education dealing with the complex and emotionally intense issues of symptom management and palliative care in the setting of serious and potentially life-threatening illness.Greater than  50%  of this time was spent counseling and coordinating care related to the above assessment and plan.  Signed by: Altha Harm, PhD, NP-C

## 2020-02-29 DIAGNOSIS — N1832 Chronic kidney disease, stage 3b: Secondary | ICD-10-CM | POA: Diagnosis not present

## 2020-02-29 DIAGNOSIS — I890 Lymphedema, not elsewhere classified: Secondary | ICD-10-CM | POA: Diagnosis not present

## 2020-02-29 DIAGNOSIS — M8949 Other hypertrophic osteoarthropathy, multiple sites: Secondary | ICD-10-CM | POA: Diagnosis not present

## 2020-02-29 DIAGNOSIS — Z6833 Body mass index (BMI) 33.0-33.9, adult: Secondary | ICD-10-CM | POA: Diagnosis not present

## 2020-02-29 DIAGNOSIS — I10 Essential (primary) hypertension: Secondary | ICD-10-CM | POA: Diagnosis not present

## 2020-02-29 DIAGNOSIS — F0151 Vascular dementia with behavioral disturbance: Secondary | ICD-10-CM | POA: Diagnosis not present

## 2020-02-29 DIAGNOSIS — R262 Difficulty in walking, not elsewhere classified: Secondary | ICD-10-CM | POA: Diagnosis not present

## 2020-02-29 DIAGNOSIS — R7309 Other abnormal glucose: Secondary | ICD-10-CM | POA: Diagnosis not present

## 2020-02-29 DIAGNOSIS — H9190 Unspecified hearing loss, unspecified ear: Secondary | ICD-10-CM | POA: Diagnosis not present

## 2020-03-01 ENCOUNTER — Telehealth: Payer: Self-pay | Admitting: Adult Health Nurse Practitioner

## 2020-03-01 DIAGNOSIS — I714 Abdominal aortic aneurysm, without rupture: Secondary | ICD-10-CM | POA: Diagnosis not present

## 2020-03-01 DIAGNOSIS — I701 Atherosclerosis of renal artery: Secondary | ICD-10-CM | POA: Diagnosis not present

## 2020-03-01 DIAGNOSIS — R269 Unspecified abnormalities of gait and mobility: Secondary | ICD-10-CM | POA: Diagnosis not present

## 2020-03-01 DIAGNOSIS — I5030 Unspecified diastolic (congestive) heart failure: Secondary | ICD-10-CM | POA: Diagnosis not present

## 2020-03-01 DIAGNOSIS — D649 Anemia, unspecified: Secondary | ICD-10-CM | POA: Diagnosis not present

## 2020-03-01 DIAGNOSIS — R8290 Unspecified abnormal findings in urine: Secondary | ICD-10-CM | POA: Diagnosis not present

## 2020-03-01 DIAGNOSIS — Z0000 Encounter for general adult medical examination without abnormal findings: Secondary | ICD-10-CM | POA: Diagnosis not present

## 2020-03-01 DIAGNOSIS — K219 Gastro-esophageal reflux disease without esophagitis: Secondary | ICD-10-CM | POA: Diagnosis not present

## 2020-03-01 DIAGNOSIS — I130 Hypertensive heart and chronic kidney disease with heart failure and stage 1 through stage 4 chronic kidney disease, or unspecified chronic kidney disease: Secondary | ICD-10-CM | POA: Diagnosis not present

## 2020-03-01 DIAGNOSIS — N183 Chronic kidney disease, stage 3 unspecified: Secondary | ICD-10-CM | POA: Diagnosis not present

## 2020-03-01 NOTE — Telephone Encounter (Signed)
Called patient's daughter, Cloyd Stagers, to offer to schedule a Palliative Consult, no answer - left message with reason for call along with my name and contact number requesting a return call.

## 2020-03-06 DIAGNOSIS — E039 Hypothyroidism, unspecified: Secondary | ICD-10-CM | POA: Diagnosis not present

## 2020-03-06 DIAGNOSIS — M1990 Unspecified osteoarthritis, unspecified site: Secondary | ICD-10-CM | POA: Diagnosis not present

## 2020-03-06 DIAGNOSIS — N183 Chronic kidney disease, stage 3 unspecified: Secondary | ICD-10-CM | POA: Diagnosis not present

## 2020-03-06 DIAGNOSIS — I10 Essential (primary) hypertension: Secondary | ICD-10-CM | POA: Diagnosis not present

## 2020-03-06 DIAGNOSIS — F0280 Dementia in other diseases classified elsewhere without behavioral disturbance: Secondary | ICD-10-CM | POA: Diagnosis not present

## 2020-03-06 DIAGNOSIS — I129 Hypertensive chronic kidney disease with stage 1 through stage 4 chronic kidney disease, or unspecified chronic kidney disease: Secondary | ICD-10-CM | POA: Diagnosis not present

## 2020-03-06 DIAGNOSIS — F419 Anxiety disorder, unspecified: Secondary | ICD-10-CM | POA: Diagnosis not present

## 2020-03-06 DIAGNOSIS — D509 Iron deficiency anemia, unspecified: Secondary | ICD-10-CM | POA: Diagnosis not present

## 2020-03-06 DIAGNOSIS — E860 Dehydration: Secondary | ICD-10-CM | POA: Diagnosis not present

## 2020-03-06 DIAGNOSIS — E538 Deficiency of other specified B group vitamins: Secondary | ICD-10-CM | POA: Diagnosis not present

## 2020-03-07 DIAGNOSIS — B351 Tinea unguium: Secondary | ICD-10-CM | POA: Diagnosis not present

## 2020-03-07 DIAGNOSIS — M79674 Pain in right toe(s): Secondary | ICD-10-CM | POA: Diagnosis not present

## 2020-03-07 DIAGNOSIS — M79675 Pain in left toe(s): Secondary | ICD-10-CM | POA: Diagnosis not present

## 2020-03-10 DIAGNOSIS — F419 Anxiety disorder, unspecified: Secondary | ICD-10-CM | POA: Diagnosis not present

## 2020-03-10 DIAGNOSIS — E860 Dehydration: Secondary | ICD-10-CM | POA: Diagnosis not present

## 2020-03-10 DIAGNOSIS — F0280 Dementia in other diseases classified elsewhere without behavioral disturbance: Secondary | ICD-10-CM | POA: Diagnosis not present

## 2020-03-10 DIAGNOSIS — E538 Deficiency of other specified B group vitamins: Secondary | ICD-10-CM | POA: Diagnosis not present

## 2020-03-10 DIAGNOSIS — E039 Hypothyroidism, unspecified: Secondary | ICD-10-CM | POA: Diagnosis not present

## 2020-03-10 DIAGNOSIS — M1990 Unspecified osteoarthritis, unspecified site: Secondary | ICD-10-CM | POA: Diagnosis not present

## 2020-03-10 DIAGNOSIS — N183 Chronic kidney disease, stage 3 unspecified: Secondary | ICD-10-CM | POA: Diagnosis not present

## 2020-03-10 DIAGNOSIS — I129 Hypertensive chronic kidney disease with stage 1 through stage 4 chronic kidney disease, or unspecified chronic kidney disease: Secondary | ICD-10-CM | POA: Diagnosis not present

## 2020-03-10 DIAGNOSIS — D509 Iron deficiency anemia, unspecified: Secondary | ICD-10-CM | POA: Diagnosis not present

## 2020-03-14 DIAGNOSIS — E538 Deficiency of other specified B group vitamins: Secondary | ICD-10-CM | POA: Diagnosis not present

## 2020-03-14 DIAGNOSIS — N183 Chronic kidney disease, stage 3 unspecified: Secondary | ICD-10-CM | POA: Diagnosis not present

## 2020-03-14 DIAGNOSIS — D509 Iron deficiency anemia, unspecified: Secondary | ICD-10-CM | POA: Diagnosis not present

## 2020-03-14 DIAGNOSIS — I129 Hypertensive chronic kidney disease with stage 1 through stage 4 chronic kidney disease, or unspecified chronic kidney disease: Secondary | ICD-10-CM | POA: Diagnosis not present

## 2020-03-14 DIAGNOSIS — F419 Anxiety disorder, unspecified: Secondary | ICD-10-CM | POA: Diagnosis not present

## 2020-03-14 DIAGNOSIS — F0280 Dementia in other diseases classified elsewhere without behavioral disturbance: Secondary | ICD-10-CM | POA: Diagnosis not present

## 2020-03-14 DIAGNOSIS — E039 Hypothyroidism, unspecified: Secondary | ICD-10-CM | POA: Diagnosis not present

## 2020-03-14 DIAGNOSIS — M1990 Unspecified osteoarthritis, unspecified site: Secondary | ICD-10-CM | POA: Diagnosis not present

## 2020-03-14 DIAGNOSIS — E860 Dehydration: Secondary | ICD-10-CM | POA: Diagnosis not present

## 2020-03-15 ENCOUNTER — Telehealth: Payer: Self-pay | Admitting: Adult Health Nurse Practitioner

## 2020-03-15 DIAGNOSIS — D509 Iron deficiency anemia, unspecified: Secondary | ICD-10-CM | POA: Diagnosis not present

## 2020-03-15 DIAGNOSIS — E039 Hypothyroidism, unspecified: Secondary | ICD-10-CM | POA: Diagnosis not present

## 2020-03-15 DIAGNOSIS — F419 Anxiety disorder, unspecified: Secondary | ICD-10-CM | POA: Diagnosis not present

## 2020-03-15 DIAGNOSIS — N183 Chronic kidney disease, stage 3 unspecified: Secondary | ICD-10-CM | POA: Diagnosis not present

## 2020-03-15 DIAGNOSIS — I129 Hypertensive chronic kidney disease with stage 1 through stage 4 chronic kidney disease, or unspecified chronic kidney disease: Secondary | ICD-10-CM | POA: Diagnosis not present

## 2020-03-15 DIAGNOSIS — E860 Dehydration: Secondary | ICD-10-CM | POA: Diagnosis not present

## 2020-03-15 DIAGNOSIS — F0280 Dementia in other diseases classified elsewhere without behavioral disturbance: Secondary | ICD-10-CM | POA: Diagnosis not present

## 2020-03-15 DIAGNOSIS — E538 Deficiency of other specified B group vitamins: Secondary | ICD-10-CM | POA: Diagnosis not present

## 2020-03-15 DIAGNOSIS — M1990 Unspecified osteoarthritis, unspecified site: Secondary | ICD-10-CM | POA: Diagnosis not present

## 2020-03-15 NOTE — Telephone Encounter (Signed)
Called daughter, Cloyd Stagers, to schedule an In-home Palliative Consult, no answer - left message with the reason for the call along with my name and contact number, requesting a return call to let me know if they wish to pursue Palliative services or not.

## 2020-03-16 ENCOUNTER — Other Ambulatory Visit: Payer: Self-pay

## 2020-03-16 DIAGNOSIS — N183 Chronic kidney disease, stage 3 unspecified: Secondary | ICD-10-CM | POA: Diagnosis not present

## 2020-03-16 DIAGNOSIS — I129 Hypertensive chronic kidney disease with stage 1 through stage 4 chronic kidney disease, or unspecified chronic kidney disease: Secondary | ICD-10-CM | POA: Diagnosis not present

## 2020-03-16 DIAGNOSIS — M1990 Unspecified osteoarthritis, unspecified site: Secondary | ICD-10-CM | POA: Diagnosis not present

## 2020-03-16 DIAGNOSIS — F419 Anxiety disorder, unspecified: Secondary | ICD-10-CM | POA: Diagnosis not present

## 2020-03-16 DIAGNOSIS — D509 Iron deficiency anemia, unspecified: Secondary | ICD-10-CM

## 2020-03-16 DIAGNOSIS — N179 Acute kidney failure, unspecified: Secondary | ICD-10-CM

## 2020-03-16 DIAGNOSIS — E039 Hypothyroidism, unspecified: Secondary | ICD-10-CM | POA: Diagnosis not present

## 2020-03-16 DIAGNOSIS — E538 Deficiency of other specified B group vitamins: Secondary | ICD-10-CM | POA: Diagnosis not present

## 2020-03-16 DIAGNOSIS — F0280 Dementia in other diseases classified elsewhere without behavioral disturbance: Secondary | ICD-10-CM | POA: Diagnosis not present

## 2020-03-16 DIAGNOSIS — E860 Dehydration: Secondary | ICD-10-CM | POA: Diagnosis not present

## 2020-03-17 DIAGNOSIS — E538 Deficiency of other specified B group vitamins: Secondary | ICD-10-CM | POA: Diagnosis not present

## 2020-03-17 DIAGNOSIS — I129 Hypertensive chronic kidney disease with stage 1 through stage 4 chronic kidney disease, or unspecified chronic kidney disease: Secondary | ICD-10-CM | POA: Diagnosis not present

## 2020-03-17 DIAGNOSIS — E039 Hypothyroidism, unspecified: Secondary | ICD-10-CM | POA: Diagnosis not present

## 2020-03-17 DIAGNOSIS — N183 Chronic kidney disease, stage 3 unspecified: Secondary | ICD-10-CM | POA: Diagnosis not present

## 2020-03-17 DIAGNOSIS — F419 Anxiety disorder, unspecified: Secondary | ICD-10-CM | POA: Diagnosis not present

## 2020-03-17 DIAGNOSIS — D509 Iron deficiency anemia, unspecified: Secondary | ICD-10-CM | POA: Diagnosis not present

## 2020-03-17 DIAGNOSIS — E860 Dehydration: Secondary | ICD-10-CM | POA: Diagnosis not present

## 2020-03-17 DIAGNOSIS — M1990 Unspecified osteoarthritis, unspecified site: Secondary | ICD-10-CM | POA: Diagnosis not present

## 2020-03-17 DIAGNOSIS — F0280 Dementia in other diseases classified elsewhere without behavioral disturbance: Secondary | ICD-10-CM | POA: Diagnosis not present

## 2020-03-19 DIAGNOSIS — D509 Iron deficiency anemia, unspecified: Secondary | ICD-10-CM | POA: Diagnosis not present

## 2020-03-19 DIAGNOSIS — E538 Deficiency of other specified B group vitamins: Secondary | ICD-10-CM | POA: Diagnosis not present

## 2020-03-19 DIAGNOSIS — I129 Hypertensive chronic kidney disease with stage 1 through stage 4 chronic kidney disease, or unspecified chronic kidney disease: Secondary | ICD-10-CM | POA: Diagnosis not present

## 2020-03-19 DIAGNOSIS — E039 Hypothyroidism, unspecified: Secondary | ICD-10-CM | POA: Diagnosis not present

## 2020-03-19 DIAGNOSIS — F0280 Dementia in other diseases classified elsewhere without behavioral disturbance: Secondary | ICD-10-CM | POA: Diagnosis not present

## 2020-03-19 DIAGNOSIS — E860 Dehydration: Secondary | ICD-10-CM | POA: Diagnosis not present

## 2020-03-19 DIAGNOSIS — N183 Chronic kidney disease, stage 3 unspecified: Secondary | ICD-10-CM | POA: Diagnosis not present

## 2020-03-21 ENCOUNTER — Other Ambulatory Visit: Payer: Self-pay

## 2020-03-21 ENCOUNTER — Inpatient Hospital Stay: Payer: Medicare HMO | Attending: Hematology and Oncology

## 2020-03-21 DIAGNOSIS — D509 Iron deficiency anemia, unspecified: Secondary | ICD-10-CM | POA: Diagnosis not present

## 2020-03-21 LAB — CBC WITH DIFFERENTIAL/PLATELET
Abs Immature Granulocytes: 0.01 10*3/uL (ref 0.00–0.07)
Basophils Absolute: 0.1 10*3/uL (ref 0.0–0.1)
Basophils Relative: 1 %
Eosinophils Absolute: 0.1 10*3/uL (ref 0.0–0.5)
Eosinophils Relative: 2 %
HCT: 32.1 % — ABNORMAL LOW (ref 36.0–46.0)
Hemoglobin: 10.2 g/dL — ABNORMAL LOW (ref 12.0–15.0)
Immature Granulocytes: 0 %
Lymphocytes Relative: 32 %
Lymphs Abs: 1.2 10*3/uL (ref 0.7–4.0)
MCH: 27.7 pg (ref 26.0–34.0)
MCHC: 31.8 g/dL (ref 30.0–36.0)
MCV: 87.2 fL (ref 80.0–100.0)
Monocytes Absolute: 0.4 10*3/uL (ref 0.1–1.0)
Monocytes Relative: 10 %
Neutro Abs: 2 10*3/uL (ref 1.7–7.7)
Neutrophils Relative %: 55 %
Platelets: 234 10*3/uL (ref 150–400)
RBC: 3.68 MIL/uL — ABNORMAL LOW (ref 3.87–5.11)
RDW: 14.6 % (ref 11.5–15.5)
WBC: 3.7 10*3/uL — ABNORMAL LOW (ref 4.0–10.5)
nRBC: 0 % (ref 0.0–0.2)

## 2020-03-21 LAB — FERRITIN: Ferritin: 45 ng/mL (ref 11–307)

## 2020-03-21 LAB — IRON AND TIBC
Iron: 56 ug/dL (ref 28–170)
Saturation Ratios: 19 % (ref 10.4–31.8)
TIBC: 291 ug/dL (ref 250–450)
UIBC: 235 ug/dL

## 2020-03-22 DIAGNOSIS — E538 Deficiency of other specified B group vitamins: Secondary | ICD-10-CM | POA: Diagnosis not present

## 2020-03-22 DIAGNOSIS — E039 Hypothyroidism, unspecified: Secondary | ICD-10-CM | POA: Diagnosis not present

## 2020-03-22 DIAGNOSIS — F419 Anxiety disorder, unspecified: Secondary | ICD-10-CM | POA: Diagnosis not present

## 2020-03-22 DIAGNOSIS — D509 Iron deficiency anemia, unspecified: Secondary | ICD-10-CM | POA: Diagnosis not present

## 2020-03-22 DIAGNOSIS — E860 Dehydration: Secondary | ICD-10-CM | POA: Diagnosis not present

## 2020-03-22 DIAGNOSIS — N183 Chronic kidney disease, stage 3 unspecified: Secondary | ICD-10-CM | POA: Diagnosis not present

## 2020-03-22 DIAGNOSIS — M1990 Unspecified osteoarthritis, unspecified site: Secondary | ICD-10-CM | POA: Diagnosis not present

## 2020-03-22 DIAGNOSIS — F0280 Dementia in other diseases classified elsewhere without behavioral disturbance: Secondary | ICD-10-CM | POA: Diagnosis not present

## 2020-03-22 DIAGNOSIS — I129 Hypertensive chronic kidney disease with stage 1 through stage 4 chronic kidney disease, or unspecified chronic kidney disease: Secondary | ICD-10-CM | POA: Diagnosis not present

## 2020-03-23 DIAGNOSIS — I129 Hypertensive chronic kidney disease with stage 1 through stage 4 chronic kidney disease, or unspecified chronic kidney disease: Secondary | ICD-10-CM | POA: Diagnosis not present

## 2020-03-23 DIAGNOSIS — E039 Hypothyroidism, unspecified: Secondary | ICD-10-CM | POA: Diagnosis not present

## 2020-03-23 DIAGNOSIS — E860 Dehydration: Secondary | ICD-10-CM | POA: Diagnosis not present

## 2020-03-23 DIAGNOSIS — E538 Deficiency of other specified B group vitamins: Secondary | ICD-10-CM | POA: Diagnosis not present

## 2020-03-23 DIAGNOSIS — F0280 Dementia in other diseases classified elsewhere without behavioral disturbance: Secondary | ICD-10-CM | POA: Diagnosis not present

## 2020-03-23 DIAGNOSIS — F419 Anxiety disorder, unspecified: Secondary | ICD-10-CM | POA: Diagnosis not present

## 2020-03-23 DIAGNOSIS — D509 Iron deficiency anemia, unspecified: Secondary | ICD-10-CM | POA: Diagnosis not present

## 2020-03-23 DIAGNOSIS — N183 Chronic kidney disease, stage 3 unspecified: Secondary | ICD-10-CM | POA: Diagnosis not present

## 2020-03-23 DIAGNOSIS — M1990 Unspecified osteoarthritis, unspecified site: Secondary | ICD-10-CM | POA: Diagnosis not present

## 2020-03-24 DIAGNOSIS — F419 Anxiety disorder, unspecified: Secondary | ICD-10-CM | POA: Diagnosis not present

## 2020-03-24 DIAGNOSIS — E860 Dehydration: Secondary | ICD-10-CM | POA: Diagnosis not present

## 2020-03-24 DIAGNOSIS — E538 Deficiency of other specified B group vitamins: Secondary | ICD-10-CM | POA: Diagnosis not present

## 2020-03-24 DIAGNOSIS — N183 Chronic kidney disease, stage 3 unspecified: Secondary | ICD-10-CM | POA: Diagnosis not present

## 2020-03-24 DIAGNOSIS — D509 Iron deficiency anemia, unspecified: Secondary | ICD-10-CM | POA: Diagnosis not present

## 2020-03-24 DIAGNOSIS — E039 Hypothyroidism, unspecified: Secondary | ICD-10-CM | POA: Diagnosis not present

## 2020-03-24 DIAGNOSIS — I129 Hypertensive chronic kidney disease with stage 1 through stage 4 chronic kidney disease, or unspecified chronic kidney disease: Secondary | ICD-10-CM | POA: Diagnosis not present

## 2020-03-24 DIAGNOSIS — F0280 Dementia in other diseases classified elsewhere without behavioral disturbance: Secondary | ICD-10-CM | POA: Diagnosis not present

## 2020-03-24 DIAGNOSIS — M1990 Unspecified osteoarthritis, unspecified site: Secondary | ICD-10-CM | POA: Diagnosis not present

## 2020-03-28 DIAGNOSIS — E538 Deficiency of other specified B group vitamins: Secondary | ICD-10-CM | POA: Diagnosis not present

## 2020-03-28 DIAGNOSIS — E860 Dehydration: Secondary | ICD-10-CM | POA: Diagnosis not present

## 2020-03-28 DIAGNOSIS — I129 Hypertensive chronic kidney disease with stage 1 through stage 4 chronic kidney disease, or unspecified chronic kidney disease: Secondary | ICD-10-CM | POA: Diagnosis not present

## 2020-03-28 DIAGNOSIS — N183 Chronic kidney disease, stage 3 unspecified: Secondary | ICD-10-CM | POA: Diagnosis not present

## 2020-03-28 DIAGNOSIS — D509 Iron deficiency anemia, unspecified: Secondary | ICD-10-CM | POA: Diagnosis not present

## 2020-03-28 DIAGNOSIS — F419 Anxiety disorder, unspecified: Secondary | ICD-10-CM | POA: Diagnosis not present

## 2020-03-28 DIAGNOSIS — F0280 Dementia in other diseases classified elsewhere without behavioral disturbance: Secondary | ICD-10-CM | POA: Diagnosis not present

## 2020-03-28 DIAGNOSIS — M1990 Unspecified osteoarthritis, unspecified site: Secondary | ICD-10-CM | POA: Diagnosis not present

## 2020-03-28 DIAGNOSIS — E039 Hypothyroidism, unspecified: Secondary | ICD-10-CM | POA: Diagnosis not present

## 2020-03-29 DIAGNOSIS — E538 Deficiency of other specified B group vitamins: Secondary | ICD-10-CM | POA: Diagnosis not present

## 2020-03-29 DIAGNOSIS — E860 Dehydration: Secondary | ICD-10-CM | POA: Diagnosis not present

## 2020-03-29 DIAGNOSIS — F0280 Dementia in other diseases classified elsewhere without behavioral disturbance: Secondary | ICD-10-CM | POA: Diagnosis not present

## 2020-03-29 DIAGNOSIS — D509 Iron deficiency anemia, unspecified: Secondary | ICD-10-CM | POA: Diagnosis not present

## 2020-03-29 DIAGNOSIS — F419 Anxiety disorder, unspecified: Secondary | ICD-10-CM | POA: Diagnosis not present

## 2020-03-29 DIAGNOSIS — E039 Hypothyroidism, unspecified: Secondary | ICD-10-CM | POA: Diagnosis not present

## 2020-03-29 DIAGNOSIS — I129 Hypertensive chronic kidney disease with stage 1 through stage 4 chronic kidney disease, or unspecified chronic kidney disease: Secondary | ICD-10-CM | POA: Diagnosis not present

## 2020-03-29 DIAGNOSIS — M1990 Unspecified osteoarthritis, unspecified site: Secondary | ICD-10-CM | POA: Diagnosis not present

## 2020-03-29 DIAGNOSIS — N183 Chronic kidney disease, stage 3 unspecified: Secondary | ICD-10-CM | POA: Diagnosis not present

## 2020-03-30 DIAGNOSIS — F0280 Dementia in other diseases classified elsewhere without behavioral disturbance: Secondary | ICD-10-CM | POA: Diagnosis not present

## 2020-03-30 DIAGNOSIS — M1990 Unspecified osteoarthritis, unspecified site: Secondary | ICD-10-CM | POA: Diagnosis not present

## 2020-03-30 DIAGNOSIS — E538 Deficiency of other specified B group vitamins: Secondary | ICD-10-CM | POA: Diagnosis not present

## 2020-03-30 DIAGNOSIS — D509 Iron deficiency anemia, unspecified: Secondary | ICD-10-CM | POA: Diagnosis not present

## 2020-03-30 DIAGNOSIS — I129 Hypertensive chronic kidney disease with stage 1 through stage 4 chronic kidney disease, or unspecified chronic kidney disease: Secondary | ICD-10-CM | POA: Diagnosis not present

## 2020-03-30 DIAGNOSIS — E039 Hypothyroidism, unspecified: Secondary | ICD-10-CM | POA: Diagnosis not present

## 2020-03-30 DIAGNOSIS — E860 Dehydration: Secondary | ICD-10-CM | POA: Diagnosis not present

## 2020-03-30 DIAGNOSIS — N183 Chronic kidney disease, stage 3 unspecified: Secondary | ICD-10-CM | POA: Diagnosis not present

## 2020-03-30 DIAGNOSIS — F419 Anxiety disorder, unspecified: Secondary | ICD-10-CM | POA: Diagnosis not present

## 2020-03-31 DIAGNOSIS — D509 Iron deficiency anemia, unspecified: Secondary | ICD-10-CM | POA: Diagnosis not present

## 2020-03-31 DIAGNOSIS — F419 Anxiety disorder, unspecified: Secondary | ICD-10-CM | POA: Diagnosis not present

## 2020-03-31 DIAGNOSIS — M1990 Unspecified osteoarthritis, unspecified site: Secondary | ICD-10-CM | POA: Diagnosis not present

## 2020-03-31 DIAGNOSIS — N183 Chronic kidney disease, stage 3 unspecified: Secondary | ICD-10-CM | POA: Diagnosis not present

## 2020-03-31 DIAGNOSIS — E039 Hypothyroidism, unspecified: Secondary | ICD-10-CM | POA: Diagnosis not present

## 2020-03-31 DIAGNOSIS — E538 Deficiency of other specified B group vitamins: Secondary | ICD-10-CM | POA: Diagnosis not present

## 2020-03-31 DIAGNOSIS — I129 Hypertensive chronic kidney disease with stage 1 through stage 4 chronic kidney disease, or unspecified chronic kidney disease: Secondary | ICD-10-CM | POA: Diagnosis not present

## 2020-03-31 DIAGNOSIS — E860 Dehydration: Secondary | ICD-10-CM | POA: Diagnosis not present

## 2020-03-31 DIAGNOSIS — F0280 Dementia in other diseases classified elsewhere without behavioral disturbance: Secondary | ICD-10-CM | POA: Diagnosis not present

## 2020-04-03 ENCOUNTER — Encounter (INDEPENDENT_AMBULATORY_CARE_PROVIDER_SITE_OTHER): Payer: Self-pay | Admitting: Vascular Surgery

## 2020-04-03 ENCOUNTER — Ambulatory Visit (INDEPENDENT_AMBULATORY_CARE_PROVIDER_SITE_OTHER): Payer: Medicare HMO

## 2020-04-03 ENCOUNTER — Other Ambulatory Visit (INDEPENDENT_AMBULATORY_CARE_PROVIDER_SITE_OTHER): Payer: Self-pay | Admitting: Vascular Surgery

## 2020-04-03 ENCOUNTER — Ambulatory Visit (INDEPENDENT_AMBULATORY_CARE_PROVIDER_SITE_OTHER): Payer: Medicare HMO | Admitting: Vascular Surgery

## 2020-04-03 ENCOUNTER — Other Ambulatory Visit: Payer: Self-pay

## 2020-04-03 VITALS — BP 101/65 | HR 67 | Resp 16 | Ht 72.0 in | Wt 238.0 lb

## 2020-04-03 DIAGNOSIS — I714 Abdominal aortic aneurysm, without rupture, unspecified: Secondary | ICD-10-CM

## 2020-04-03 DIAGNOSIS — I482 Chronic atrial fibrillation, unspecified: Secondary | ICD-10-CM | POA: Diagnosis not present

## 2020-04-03 DIAGNOSIS — N289 Disorder of kidney and ureter, unspecified: Secondary | ICD-10-CM

## 2020-04-03 DIAGNOSIS — I701 Atherosclerosis of renal artery: Secondary | ICD-10-CM | POA: Diagnosis not present

## 2020-04-03 DIAGNOSIS — I10 Essential (primary) hypertension: Secondary | ICD-10-CM

## 2020-04-03 DIAGNOSIS — I872 Venous insufficiency (chronic) (peripheral): Secondary | ICD-10-CM

## 2020-04-03 NOTE — Progress Notes (Signed)
MRN : [id number]  Nicole Bishop is a 86 y.o. (1930/11/06) female who presents with chief complaint of  Chief Complaint  Patient presents with   Follow-up    ultrasound  .  History of Present Illness:   Patient is seen for evaluation of right renal artery stenosis.  Her hypertension remains well controlled but her renal function is deteriorating.  Duplex ultrasound today shows a greater than 50% right renal artery stenosis.  Also noted is the AAA which is now 4.4 cm  Current Meds  Medication Sig   amLODipine (NORVASC) 5 MG tablet Take 5 mg by mouth daily.   benazepril (LOTENSIN) 5 MG tablet Take 5 mg by mouth daily.   calcitRIOL (ROCALTROL) 0.25 MCG capsule Take 1 capsule by mouth daily.   Cholecalciferol (VITAMIN D3) 2000 units capsule Take 4,000 Units by mouth daily.   CYANOCOBALAMIN PO Take 1,000 mcg by mouth daily.   diclofenac Sodium (VOLTAREN) 1 % GEL Apply 2 g topically as needed.    docusate sodium (COLACE) 100 MG capsule Take 100 mg by mouth 2 (two) times daily.    esomeprazole (NEXIUM) 40 MG capsule Take 40 mg by mouth daily as needed (acid reflux).    Ferrous Sulfate (SLOW FE) 142 (45 Fe) MG TBCR Take 1 tablet by mouth 2 (two) times daily.    lactulose (CONSTULOSE) 10 GM/15ML solution Take 10 g by mouth daily as needed for mild constipation.    loratadine (CLARITIN) 10 MG tablet Take 1 tablet (10 mg total) by mouth daily. (Patient taking differently: Take 10 mg by mouth daily as needed. )   metoprolol succinate (TOPROL-XL) 50 MG 24 hr tablet Take 1 tablet (50 mg total) by mouth daily. Take with or immediately following a meal. (Patient taking differently: Take 25 mg by mouth daily. Take with or immediately following a meal.)   mometasone (NASONEX) 50 MCG/ACT nasal spray Place 2 sprays into the nose daily as needed (allergies).    olmesartan (BENICAR) 20 MG tablet Take 20 mg by mouth daily.    olopatadine (PATANOL) 0.1 % ophthalmic solution Place 1 drop  into both eyes 2 (two) times daily as needed for allergies.    polyethylene glycol (MIRALAX / GLYCOLAX) packet Take 17 g by mouth daily as needed for mild constipation or moderate constipation.   risperiDONE (RISPERDAL) 0.25 MG tablet Take 1 tablet by mouth daily.   rivaroxaban (XARELTO) 20 MG TABS tablet Take 1 tablet (20 mg total) by mouth daily with supper.    Past Medical History:  Diagnosis Date   Anemia    Anxiety    Arthritis    Chronic kidney disease    Coronary artery disease    Depression    Dizziness    Dyspnea    Dysrhythmia    gets tachy if anxious or excited   GERD (gastroesophageal reflux disease)    History of hiatal hernia    HOH (hard of hearing)    Hypertension    Hypothyroidism    IDA (iron deficiency anemia) 12/14/2014   Myocardial infarction [facility]) 1992   Renal insufficiency    Sleep apnea    Tremors of nervous system     Past Surgical History:  Procedure Laterality Date   CATARACT EXTRACTION W/PHACO Left 07/15/2017   Procedure: CATARACT EXTRACTION PHACO AND INTRAOCULAR LENS PLACEMENT (IOC)-LEFT;  Surgeon: Birder Robson, MD;  Location: [facility] ORS;  Service: Ophthalmology;  Laterality: Left;  [place] 00.36.1 AP% 13.9 CDE 5.02 Fluid Pack lot #  [id number]   CATARACT EXTRACTION W/PHACO Right 08/26/2017   Procedure: CATARACT EXTRACTION PHACO AND INTRAOCULAR LENS PLACEMENT (IOC);  Surgeon: Birder Robson, MD;  Location: [facility] ORS;  Service: Ophthalmology;  Laterality: Right;  [place] 00:41.0 AP% 15.3 CDE 6.27 FLUID PACK LOT # [id number]   CHOLECYSTECTOMY     COLONOSCOPY WITH PROPOFOL N/A 04/10/2015   Procedure: COLONOSCOPY WITH PROPOFOL;  Surgeon: Manya Silvas, MD;  Location: [facility] ENDOSCOPY;  Service: Endoscopy;  Laterality: N/A;   ESOPHAGOGASTRODUODENOSCOPY (EGD) WITH PROPOFOL N/A 04/10/2015   Procedure: ESOPHAGOGASTRODUODENOSCOPY (EGD) WITH PROPOFOL;  Surgeon: Manya Silvas, MD;  Location: [facility] ENDOSCOPY;  Service: Endoscopy;   Laterality: N/A;   fatty tumor removed on left shoulder     gallbladdedr removed     JOINT REPLACEMENT Bilateral    total knee replacements   REPLACEMENT TOTAL KNEE BILATERAL Bilateral     Social History Social History   Tobacco Use   Smoking status: Never Smoker   Smokeless tobacco: Never Used  Scientific laboratory technician Use: Never used  Substance Use Topics   Alcohol use: No   Drug use: No    Family History Family History  Problem Relation Age of Onset   Arthritis Mother    Hypertension Mother    Stroke Mother    Hypertension Father    Heart attack Father    Breast cancer Neg Hx     Allergies  Allergen Reactions   Penicillins Rash and Other (See Comments)    Has patient had a PCN reaction causing immediate rash, facial/tongue/throat swelling, SOB or lightheadedness with hypotension: Unknown Has patient had a PCN reaction causing severe rash involving mucus membranes or skin necrosis: Unknown Has patient had a PCN reaction that required hospitalization: Unknown Has patient had a PCN reaction occurring within the last 10 years: Unknown If all of the above answers are "NO", then may proceed with Cephalosporin use.    Nsaids Other (See Comments)    GI upset   Rosuvastatin Other (See Comments)   Atorvastatin Rash   Celebrex [Celecoxib] Other (See Comments)    Constipation and stomach upset   Felodipine Nausea Only   Oxaprozin Nausea Only   Rofecoxib Other (See Comments)    GI upset   Simvastatin Rash     REVIEW OF SYSTEMS (Negative unless checked)  Constitutional: [] Weight loss  [] Fever  [] Chills Cardiac: [] Chest pain   [] Chest pressure   [] Palpitations   [] Shortness of breath when laying flat   [] Shortness of breath with exertion. Vascular:  [] Pain in legs with walking   [] Pain in legs at rest  [] History of DVT   [] Phlebitis   [] Swelling in legs   [] Varicose veins   [] Non-healing ulcers Pulmonary:   [] Uses home oxygen   [] Productive cough    [] Hemoptysis   [] Wheeze  [] COPD   [] Asthma Neurologic:  [] Dizziness   [] Seizures   [] History of stroke   [] History of TIA  [] Aphasia   [] Vissual changes   [] Weakness or numbness in arm   [] Weakness or numbness in leg Musculoskeletal:   [] Joint swelling   [] Joint pain   [] Low back pain Hematologic:  [] Easy bruising  [] Easy bleeding   [] Hypercoagulable state   [] Anemic Gastrointestinal:  [] Diarrhea   [] Vomiting  [] Gastroesophageal reflux/heartburn   [] Difficulty swallowing. Genitourinary:  [] Chronic kidney disease   [] Difficult urination  [] Frequent urination   [] Blood in urine Skin:  [] Rashes   [] Ulcers  Psychological:  [] History of anxiety   []  History of major depression.  Physical Examination  Vitals:   04/03/20 0937  BP: 101/65  Pulse: 67  Resp: 16  Weight: 238 lb (108 kg)  Height: 6' (1.829 m)   Body mass index is 32.28 kg/m. Gen: WD/WN, NAD Head: [place]/AT, No temporalis wasting.  Ear/Nose/Throat: Hearing grossly intact, nares w/o erythema or drainage Eyes: PER, EOMI, sclera nonicteric.  Neck: Supple, no large masses.   Pulmonary:  Good air movement, no audible wheezing bilaterally, no use of accessory muscles.  Cardiac: RRR, no JVD Vascular:  Vessel Right Left  Radial Palpable Palpable  Gastrointestinal: Non-distended. No guarding/no peritoneal signs.  Musculoskeletal: M/S 5/5 throughout.  No deformity or atrophy.  Neurologic: CN 2-12 intact. Symmetrical.  Speech is fluent. Motor exam as listed above. Psychiatric: Judgment intact, Mood & affect appropriate for pt's clinical situation. Dermatologic: No rashes or ulcers noted.  No changes consistent with cellulitis.   CBC Lab Results  Component Value Date   WBC 3.7 (L) 03/21/2020   HGB 10.2 (L) 03/21/2020   HCT 32.1 (L) 03/21/2020   MCV 87.2 03/21/2020   PLT 234 03/21/2020    BMET    Component Value Date/Time   NA 139 02/28/2020 1425   NA 135 11/21/2014 1301   K 4.4 02/28/2020 1425   K 4.1 11/21/2014 1301   CL  107 02/28/2020 1425   CL 105 11/21/2014 1301   CO2 22 02/28/2020 1425   CO2 23 11/21/2014 1301   GLUCOSE 99 02/28/2020 1425   GLUCOSE 102 (H) 11/21/2014 1301   BUN 23 02/28/2020 1425   BUN 15 11/21/2014 1301   CREATININE 1.80 (H) 02/28/2020 1425   CREATININE 1.08 (H) 11/21/2014 1301   CALCIUM 9.0 02/28/2020 1425   CALCIUM 9.0 11/21/2014 1301   GFRNONAA 25 (L) 02/28/2020 1425   GFRNONAA 48 (L) 11/21/2014 1301   GFRAA 29 (L) 02/28/2020 1425   GFRAA 56 (L) 11/21/2014 1301   CrCl cannot be calculated (Patient's most recent lab result is older than the maximum 21 days allowed.).  COAG Lab Results  Component Value Date   INR 1.20 06/09/2018   INR 1.1 11/21/2014    Radiology No results found.   Assessment/Plan 1. AAA (abdominal aortic aneurysm) without rupture ([place]) Recommend: The aneurysm is about 5.5 cm and therefore she should undergo repair. Patient is status post CT scan of the abdominal aorta last April. I believe the patient is a candidate for endovascular repair if we snorkel both renal arteries.   She will require cardiac clearance.   The patient will continue antiplatelet therapy as prescribed (since the patient is undergoing endovascular repair as opposed to open repair) as well as aggressive management of hyperlipidemia. Exercise is again strongly encouraged.   The patient is reminded that lifetime routine surveillance is a necessity with an endograft.   The risks and benefits of AAA repair are reviewed with the patient.  All questions are answered.  Alternative therapies are also discussed.  The patient agrees to proceed with endovascular aneurysm repair.  Patient will follow-up with me in the office after the surgery.  2. Right renal artery stenosis ([place]) See #1  3. Chronic venous insufficiency No surgery or intervention at this point in time.    I have reviewed my discussion with the patient regarding venous insufficiency and secondary lymph edema and why it   causes symptoms. I have discussed with the patient the chronic skin changes that accompany these problems and the long term sequela such as ulceration and infection.  Patient will  continue wearing graduated compression stockings class 1 (20-30 mmHg) on a daily basis a prescription was given to the patient to keep this updated. The patient will  put the stockings on first thing in the morning and removing them in the evening. The patient is instructed specifically not to sleep in the stockings.  In addition, behavioral modification including elevation during the day will be continued.  Diet and salt restriction was also discussed.  Previous duplex ultrasound of the lower extremities shows normal deep venous system, superficial reflux was not present.   Following the review of the ultrasound the patient will follow up in 12 months to reassess the degree of swelling and the control that graduated compression is offering.   The patient can be assessed for a Lymph Pump at that time.  However, at this time the patient states they are satisfied with the control compression and elevation is yielding.    4. Chronic atrial fibrillation (HCC) Continue antiarrhythmia medications as already ordered, these medications have been reviewed and there are no changes at this time.  Continue anticoagulation as ordered by Cardiology Service   5. Essential (primary) hypertension Continue antihypertensive medications as already ordered, these medications have been reviewed and there are no changes at this time.     Hortencia Pilar, MD  04/03/2020 7:16 PM

## 2020-04-05 DIAGNOSIS — E860 Dehydration: Secondary | ICD-10-CM | POA: Diagnosis not present

## 2020-04-05 DIAGNOSIS — N183 Chronic kidney disease, stage 3 unspecified: Secondary | ICD-10-CM | POA: Diagnosis not present

## 2020-04-05 DIAGNOSIS — F419 Anxiety disorder, unspecified: Secondary | ICD-10-CM | POA: Diagnosis not present

## 2020-04-05 DIAGNOSIS — M1990 Unspecified osteoarthritis, unspecified site: Secondary | ICD-10-CM | POA: Diagnosis not present

## 2020-04-05 DIAGNOSIS — E039 Hypothyroidism, unspecified: Secondary | ICD-10-CM | POA: Diagnosis not present

## 2020-04-05 DIAGNOSIS — D509 Iron deficiency anemia, unspecified: Secondary | ICD-10-CM | POA: Diagnosis not present

## 2020-04-05 DIAGNOSIS — E538 Deficiency of other specified B group vitamins: Secondary | ICD-10-CM | POA: Diagnosis not present

## 2020-04-05 DIAGNOSIS — F0280 Dementia in other diseases classified elsewhere without behavioral disturbance: Secondary | ICD-10-CM | POA: Diagnosis not present

## 2020-04-05 DIAGNOSIS — I129 Hypertensive chronic kidney disease with stage 1 through stage 4 chronic kidney disease, or unspecified chronic kidney disease: Secondary | ICD-10-CM | POA: Diagnosis not present

## 2020-04-06 DIAGNOSIS — F0280 Dementia in other diseases classified elsewhere without behavioral disturbance: Secondary | ICD-10-CM | POA: Diagnosis not present

## 2020-04-06 DIAGNOSIS — N183 Chronic kidney disease, stage 3 unspecified: Secondary | ICD-10-CM | POA: Diagnosis not present

## 2020-04-06 DIAGNOSIS — E860 Dehydration: Secondary | ICD-10-CM | POA: Diagnosis not present

## 2020-04-06 DIAGNOSIS — M1990 Unspecified osteoarthritis, unspecified site: Secondary | ICD-10-CM | POA: Diagnosis not present

## 2020-04-06 DIAGNOSIS — E039 Hypothyroidism, unspecified: Secondary | ICD-10-CM | POA: Diagnosis not present

## 2020-04-06 DIAGNOSIS — I129 Hypertensive chronic kidney disease with stage 1 through stage 4 chronic kidney disease, or unspecified chronic kidney disease: Secondary | ICD-10-CM | POA: Diagnosis not present

## 2020-04-06 DIAGNOSIS — F419 Anxiety disorder, unspecified: Secondary | ICD-10-CM | POA: Diagnosis not present

## 2020-04-06 DIAGNOSIS — D509 Iron deficiency anemia, unspecified: Secondary | ICD-10-CM | POA: Diagnosis not present

## 2020-04-06 DIAGNOSIS — E538 Deficiency of other specified B group vitamins: Secondary | ICD-10-CM | POA: Diagnosis not present

## 2020-04-07 DIAGNOSIS — M1990 Unspecified osteoarthritis, unspecified site: Secondary | ICD-10-CM | POA: Diagnosis not present

## 2020-04-07 DIAGNOSIS — E538 Deficiency of other specified B group vitamins: Secondary | ICD-10-CM | POA: Diagnosis not present

## 2020-04-07 DIAGNOSIS — N183 Chronic kidney disease, stage 3 unspecified: Secondary | ICD-10-CM | POA: Diagnosis not present

## 2020-04-07 DIAGNOSIS — I129 Hypertensive chronic kidney disease with stage 1 through stage 4 chronic kidney disease, or unspecified chronic kidney disease: Secondary | ICD-10-CM | POA: Diagnosis not present

## 2020-04-07 DIAGNOSIS — F0280 Dementia in other diseases classified elsewhere without behavioral disturbance: Secondary | ICD-10-CM | POA: Diagnosis not present

## 2020-04-07 DIAGNOSIS — F419 Anxiety disorder, unspecified: Secondary | ICD-10-CM | POA: Diagnosis not present

## 2020-04-07 DIAGNOSIS — E039 Hypothyroidism, unspecified: Secondary | ICD-10-CM | POA: Diagnosis not present

## 2020-04-07 DIAGNOSIS — D509 Iron deficiency anemia, unspecified: Secondary | ICD-10-CM | POA: Diagnosis not present

## 2020-04-07 DIAGNOSIS — E860 Dehydration: Secondary | ICD-10-CM | POA: Diagnosis not present

## 2020-04-14 DIAGNOSIS — E039 Hypothyroidism, unspecified: Secondary | ICD-10-CM | POA: Diagnosis not present

## 2020-04-14 DIAGNOSIS — M1990 Unspecified osteoarthritis, unspecified site: Secondary | ICD-10-CM | POA: Diagnosis not present

## 2020-04-14 DIAGNOSIS — I129 Hypertensive chronic kidney disease with stage 1 through stage 4 chronic kidney disease, or unspecified chronic kidney disease: Secondary | ICD-10-CM | POA: Diagnosis not present

## 2020-04-14 DIAGNOSIS — F0280 Dementia in other diseases classified elsewhere without behavioral disturbance: Secondary | ICD-10-CM | POA: Diagnosis not present

## 2020-04-14 DIAGNOSIS — E538 Deficiency of other specified B group vitamins: Secondary | ICD-10-CM | POA: Diagnosis not present

## 2020-04-14 DIAGNOSIS — D509 Iron deficiency anemia, unspecified: Secondary | ICD-10-CM | POA: Diagnosis not present

## 2020-04-14 DIAGNOSIS — F419 Anxiety disorder, unspecified: Secondary | ICD-10-CM | POA: Diagnosis not present

## 2020-04-14 DIAGNOSIS — N183 Chronic kidney disease, stage 3 unspecified: Secondary | ICD-10-CM | POA: Diagnosis not present

## 2020-04-14 DIAGNOSIS — E860 Dehydration: Secondary | ICD-10-CM | POA: Diagnosis not present

## 2020-04-20 DIAGNOSIS — M1990 Unspecified osteoarthritis, unspecified site: Secondary | ICD-10-CM | POA: Diagnosis not present

## 2020-04-20 DIAGNOSIS — E039 Hypothyroidism, unspecified: Secondary | ICD-10-CM | POA: Diagnosis not present

## 2020-04-20 DIAGNOSIS — N183 Chronic kidney disease, stage 3 unspecified: Secondary | ICD-10-CM | POA: Diagnosis not present

## 2020-04-20 DIAGNOSIS — E538 Deficiency of other specified B group vitamins: Secondary | ICD-10-CM | POA: Diagnosis not present

## 2020-04-20 DIAGNOSIS — I129 Hypertensive chronic kidney disease with stage 1 through stage 4 chronic kidney disease, or unspecified chronic kidney disease: Secondary | ICD-10-CM | POA: Diagnosis not present

## 2020-04-20 DIAGNOSIS — D509 Iron deficiency anemia, unspecified: Secondary | ICD-10-CM | POA: Diagnosis not present

## 2020-04-20 DIAGNOSIS — E860 Dehydration: Secondary | ICD-10-CM | POA: Diagnosis not present

## 2020-04-20 DIAGNOSIS — F419 Anxiety disorder, unspecified: Secondary | ICD-10-CM | POA: Diagnosis not present

## 2020-04-20 DIAGNOSIS — F0280 Dementia in other diseases classified elsewhere without behavioral disturbance: Secondary | ICD-10-CM | POA: Diagnosis not present

## 2020-04-27 ENCOUNTER — Telehealth: Payer: Self-pay | Admitting: Adult Health Nurse Practitioner

## 2020-04-27 NOTE — Telephone Encounter (Signed)
Spoke with patient's daughter, Cloyd Stagers, regarding the Palliative referral and she has declined Palliative services at this time.  Instructed daughter to contact the MD office if they change their mind and wish to pursue Palliative services in the future, daughter was in agreement with this.  Will notify MD.

## 2020-05-11 DIAGNOSIS — N2581 Secondary hyperparathyroidism of renal origin: Secondary | ICD-10-CM | POA: Diagnosis not present

## 2020-05-11 DIAGNOSIS — I701 Atherosclerosis of renal artery: Secondary | ICD-10-CM | POA: Diagnosis not present

## 2020-05-11 DIAGNOSIS — N1832 Chronic kidney disease, stage 3b: Secondary | ICD-10-CM | POA: Diagnosis not present

## 2020-05-11 DIAGNOSIS — I10 Essential (primary) hypertension: Secondary | ICD-10-CM | POA: Diagnosis not present

## 2020-05-11 DIAGNOSIS — R600 Localized edema: Secondary | ICD-10-CM | POA: Diagnosis not present

## 2020-05-11 DIAGNOSIS — D631 Anemia in chronic kidney disease: Secondary | ICD-10-CM | POA: Diagnosis not present

## 2020-05-16 DIAGNOSIS — N1831 Chronic kidney disease, stage 3a: Secondary | ICD-10-CM | POA: Diagnosis not present

## 2020-05-25 ENCOUNTER — Encounter (INDEPENDENT_AMBULATORY_CARE_PROVIDER_SITE_OTHER): Payer: Self-pay | Admitting: Nurse Practitioner

## 2020-05-25 ENCOUNTER — Ambulatory Visit (INDEPENDENT_AMBULATORY_CARE_PROVIDER_SITE_OTHER): Payer: Medicare HMO

## 2020-05-25 ENCOUNTER — Ambulatory Visit (INDEPENDENT_AMBULATORY_CARE_PROVIDER_SITE_OTHER): Payer: Medicare HMO | Admitting: Nurse Practitioner

## 2020-05-25 ENCOUNTER — Other Ambulatory Visit: Payer: Self-pay

## 2020-05-25 VITALS — BP 90/56 | HR 65 | Resp 16 | Wt 234.0 lb

## 2020-05-25 DIAGNOSIS — E782 Mixed hyperlipidemia: Secondary | ICD-10-CM

## 2020-05-25 DIAGNOSIS — I714 Abdominal aortic aneurysm, without rupture, unspecified: Secondary | ICD-10-CM

## 2020-05-25 DIAGNOSIS — I701 Atherosclerosis of renal artery: Secondary | ICD-10-CM

## 2020-05-30 ENCOUNTER — Encounter (INDEPENDENT_AMBULATORY_CARE_PROVIDER_SITE_OTHER): Payer: Self-pay | Admitting: Nurse Practitioner

## 2020-05-30 NOTE — Progress Notes (Signed)
Subjective:    Patient ID: Nicole Bishop, female    DOB: July 24, 1931, 86 y.o.   MRN: [id number] Chief Complaint  Patient presents with  . Follow-up    ultrasound follow up    The patient returns to the office for surveillance of a known abdominal aortic aneurysm. Patient denies abdominal pain or back pain, no other abdominal complaints. No changes suggesting embolic episodes.   There have been no interval changes in the patient's overall health care since his last visit.  Patient denies amaurosis fugax or TIA symptoms. There is no history of claudication or rest pain symptoms of the lower extremities. The patient denies angina or shortness of breath.   The patient has good blood pressure control at this time as well.  Duplex US of the aorta and iliac arteries shows an AAA measured 4.5 cm at the midportion.  There is evidence of mild abnormal dilatation of the right common and the left common iliac arteries.  Previous CT scan place the abdominal aortic measurement at 5.1 cm.   Review of Systems  Cardiovascular: Positive for leg swelling.  Neurological: Positive for weakness.  Psychiatric/Behavioral: Positive for confusion.  All other systems reviewed and are negative.      Objective:   Physical Exam Vitals reviewed.  HENT:     Head: Normocephalic.  Cardiovascular:     Rate and Rhythm: Normal rate and regular rhythm.     Pulses: Normal pulses.     Heart sounds: Normal heart sounds.  Pulmonary:     Effort: Pulmonary effort is normal.  Neurological:     Mental Status: She is alert. Mental status is at baseline.     Motor: Weakness present.  Psychiatric:        Mood and Affect: Mood normal.        Behavior: Behavior normal.        Thought Content: Thought content normal.        Judgment: Judgment normal.     BP (!) 90/56 (BP Location: Right Arm)   Pulse 65   Resp 16   Wt 234 lb (106.1 kg)   BMI 31.74 kg/m   Past Medical History:  Diagnosis Date  . Anemia   .  Anxiety   . Arthritis   . Chronic kidney disease   . Coronary artery disease   . Depression   . Dizziness   . Dyspnea   . Dysrhythmia    gets tachy if anxious or excited  . GERD (gastroesophageal reflux disease)   . History of hiatal hernia   . HOH (hard of hearing)   . Hypertension   . Hypothyroidism   . IDA (iron deficiency anemia) 12/14/2014  . Myocardial infarction ([place]) 1992  . Renal insufficiency   . Sleep apnea   . Tremors of nervous system     Social History   Socioeconomic History  . Marital status: Widowed    Spouse name: Not on file  . Number of children: 70  . Years of education: Not on file  . Highest education level: 11th grade  Occupational History    Comment: retired  Tobacco Use  . Smoking status: Never Smoker  . Smokeless tobacco: Never Used  Vaping Use  . Vaping Use: Never used  Substance and Sexual Activity  . Alcohol use: No  . Drug use: No  . Sexual activity: Never  Other Topics Concern  . Not on file  Social History Narrative  . Not on file  Social Determinants of Health   Financial Resource Strain:   . Difficulty of Paying Living Expenses: Not on file  Food Insecurity:   . Worried About Charity fundraiser in the Last Year: Not on file  . Ran Out of Food in the Last Year: Not on file  Transportation Needs:   . Lack of Transportation (Medical): Not on file  . Lack of Transportation (Non-Medical): Not on file  Physical Activity:   . Days of Exercise per Week: Not on file  . Minutes of Exercise per Session: Not on file  Stress:   . Feeling of Stress : Not on file  Social Connections:   . Frequency of Communication with Friends and Family: Not on file  . Frequency of Social Gatherings with Friends and Family: Not on file  . Attends Religious Services: Not on file  . Active Member of Clubs or Organizations: Not on file  . Attends Archivist Meetings: Not on file  . Marital Status: Not on file  Intimate Partner Violence:    . Fear of Current or Ex-Partner: Not on file  . Emotionally Abused: Not on file  . Physically Abused: Not on file  . Sexually Abused: Not on file    Past Surgical History:  Procedure Laterality Date  . CATARACT EXTRACTION W/PHACO Left 07/15/2017   Procedure: CATARACT EXTRACTION PHACO AND INTRAOCULAR LENS PLACEMENT (IOC)-LEFT;  Surgeon: Birder Robson, MD;  Location: [facility] ORS;  Service: Ophthalmology;  Laterality: Left;  [place] 00.36.1 AP% 13.9 CDE 5.02 Fluid Pack lot # [id number]  . CATARACT EXTRACTION W/PHACO Right 08/26/2017   Procedure: CATARACT EXTRACTION PHACO AND INTRAOCULAR LENS PLACEMENT (IOC);  Surgeon: Birder Robson, MD;  Location: [facility] ORS;  Service: Ophthalmology;  Laterality: Right;  [place] 00:41.0 AP% 15.3 CDE 6.27 FLUID PACK LOT # [id number]  . CHOLECYSTECTOMY    . COLONOSCOPY WITH PROPOFOL N/A 04/10/2015   Procedure: COLONOSCOPY WITH PROPOFOL;  Surgeon: Manya Silvas, MD;  Location: [facility] ENDOSCOPY;  Service: Endoscopy;  Laterality: N/A;  . ESOPHAGOGASTRODUODENOSCOPY (EGD) WITH PROPOFOL N/A 04/10/2015   Procedure: ESOPHAGOGASTRODUODENOSCOPY (EGD) WITH PROPOFOL;  Surgeon: Manya Silvas, MD;  Location: [facility] ENDOSCOPY;  Service: Endoscopy;  Laterality: N/A;  . fatty tumor removed on left shoulder    . gallbladdedr removed    . JOINT REPLACEMENT Bilateral    total knee replacements  . REPLACEMENT TOTAL KNEE BILATERAL Bilateral     Family History  Problem Relation Age of Onset  . Arthritis Mother   . Hypertension Mother   . Stroke Mother   . Hypertension Father   . Heart attack Father   . Breast cancer Neg Hx     Allergies  Allergen Reactions  . Penicillins Rash and Other (See Comments)    Has patient had a PCN reaction causing immediate rash, facial/tongue/throat swelling, SOB or lightheadedness with hypotension: Unknown Has patient had a PCN reaction causing severe rash involving mucus membranes or skin necrosis: Unknown Has patient had a PCN reaction that  required hospitalization: Unknown Has patient had a PCN reaction occurring within the last 10 years: Unknown If all of the above answers are "NO", then may proceed with Cephalosporin use.   . Nsaids Other (See Comments)    GI upset  . Rosuvastatin Other (See Comments)  . Atorvastatin Rash  . Celebrex [Celecoxib] Other (See Comments)    Constipation and stomach upset  . Felodipine Nausea Only  . Oxaprozin Nausea Only  . Rofecoxib Other (See Comments)  GI upset  . Simvastatin Rash       Assessment & Plan:   1. Right renal artery stenosis (HCC) Currently the patient's blood pressure is under good control.  We will continue with conservative therapy as planned below.  2. Abdominal aortic aneurysm (AAA) without rupture (HCC) Given the patient's severe dementia in addition to her renal dysfunction and other cardiac issues, we will treat this very conservatively.  We will not recommend treatment until the patient's aneurysm is at least 5.5 cm.  Discussed with family that at 5.5 cm the risk of rupture is approximately 15 %/year.  Due to the patient's renal artery stenosis intervention will likely be extended which may also place the patient at risk for possible renal failure.  Due to all these issues will continue with the conservative management of close follow-up.  I discussed with patient the emergent signs symptoms such as distal embolization or feeling of abdominal pain with back pain.  We will have the patient return in 6 months with noninvasive studies.  3. Mixed hyperlipidemia Continue statin as ordered and reviewed, no changes at this time    Current Outpatient Medications on File Prior to Visit  Medication Sig Dispense Refill  . amLODipine (NORVASC) 5 MG tablet Take 5 mg by mouth daily.    . benazepril (LOTENSIN) 5 MG tablet Take 5 mg by mouth daily.    . calcitRIOL (ROCALTROL) 0.25 MCG capsule Take 1 capsule by mouth daily.    . Cholecalciferol (VITAMIN D3) 2000 units capsule  Take 4,000 Units by mouth daily.    . CYANOCOBALAMIN PO Take 1,000 mcg by mouth daily.    . diclofenac Sodium (VOLTAREN) 1 % GEL Apply 2 g topically as needed.     . docusate sodium (COLACE) 100 MG capsule Take 100 mg by mouth 2 (two) times daily.     Marland Kitchen esomeprazole (NEXIUM) 40 MG capsule Take 40 mg by mouth daily as needed (acid reflux).     . Ferrous Sulfate (SLOW FE) 142 (45 Fe) MG TBCR Take 1 tablet by mouth 2 (two) times daily.     Marland Kitchen lactulose (CONSTULOSE) 10 GM/15ML solution Take 10 g by mouth daily as needed for mild constipation.     Marland Kitchen loratadine (CLARITIN) 10 MG tablet Take 1 tablet (10 mg total) by mouth daily. (Patient taking differently: Take 10 mg by mouth daily as needed. ) 30 tablet 0  . metoprolol succinate (TOPROL-XL) 50 MG 24 hr tablet Take 1 tablet (50 mg total) by mouth daily. Take with or immediately following a meal. (Patient taking differently: Take 25 mg by mouth daily. Take with or immediately following a meal.) 30 tablet 0  . mometasone (NASONEX) 50 MCG/ACT nasal spray Place 2 sprays into the nose daily as needed (allergies).     . olmesartan (BENICAR) 20 MG tablet Take 20 mg by mouth daily.     Marland Kitchen olopatadine (PATANOL) 0.1 % ophthalmic solution Place 1 drop into both eyes 2 (two) times daily as needed for allergies.     . polyethylene glycol (MIRALAX / GLYCOLAX) packet Take 17 g by mouth daily as needed for mild constipation or moderate constipation.    . risperiDONE (RISPERDAL) 0.25 MG tablet Take 1 tablet by mouth daily.    . rivaroxaban (XARELTO) 20 MG TABS tablet Take 1 tablet (20 mg total) by mouth daily with supper. 30 tablet 0  . acetaminophen (TYLENOL) 650 MG CR tablet Take 650 mg by mouth every 8 (eight) hours as  needed for pain. (Patient not taking: Reported on 04/03/2020)     Current Facility-Administered Medications on File Prior to Visit  Medication Dose Route Frequency Provider Last Rate Last Admin  . 0.9 %  sodium chloride infusion   Intravenous Continuous  Lequita Asal, MD   Stopped at 02/06/18 1429    There are no Patient Instructions on file for this visit. No follow-ups on file.   Kris Hartmann, NP

## 2020-05-31 DIAGNOSIS — I10 Essential (primary) hypertension: Secondary | ICD-10-CM | POA: Diagnosis not present

## 2020-05-31 DIAGNOSIS — I480 Paroxysmal atrial fibrillation: Secondary | ICD-10-CM | POA: Diagnosis not present

## 2020-05-31 DIAGNOSIS — E782 Mixed hyperlipidemia: Secondary | ICD-10-CM | POA: Diagnosis not present

## 2020-05-31 DIAGNOSIS — Z23 Encounter for immunization: Secondary | ICD-10-CM | POA: Diagnosis not present

## 2020-05-31 DIAGNOSIS — R42 Dizziness and giddiness: Secondary | ICD-10-CM | POA: Diagnosis not present

## 2020-05-31 DIAGNOSIS — I872 Venous insufficiency (chronic) (peripheral): Secondary | ICD-10-CM | POA: Diagnosis not present

## 2020-05-31 DIAGNOSIS — I5032 Chronic diastolic (congestive) heart failure: Secondary | ICD-10-CM | POA: Diagnosis not present

## 2020-06-07 DIAGNOSIS — B351 Tinea unguium: Secondary | ICD-10-CM | POA: Diagnosis not present

## 2020-06-07 DIAGNOSIS — M79674 Pain in right toe(s): Secondary | ICD-10-CM | POA: Diagnosis not present

## 2020-06-07 DIAGNOSIS — G629 Polyneuropathy, unspecified: Secondary | ICD-10-CM | POA: Diagnosis not present

## 2020-06-07 DIAGNOSIS — I872 Venous insufficiency (chronic) (peripheral): Secondary | ICD-10-CM | POA: Diagnosis not present

## 2020-06-07 DIAGNOSIS — M79675 Pain in left toe(s): Secondary | ICD-10-CM | POA: Diagnosis not present

## 2020-06-16 ENCOUNTER — Other Ambulatory Visit: Payer: Self-pay

## 2020-06-16 DIAGNOSIS — N179 Acute kidney failure, unspecified: Secondary | ICD-10-CM

## 2020-06-16 DIAGNOSIS — D509 Iron deficiency anemia, unspecified: Secondary | ICD-10-CM

## 2020-06-20 NOTE — Progress Notes (Signed)
[facility]  [address] [place], [place] [address] Phone: [phone]  Fax: [phone]   Clinic Day:  06/21/2020  Referring physician: Tracie Harrier, MD  Chief Complaint: Nicole Bishop is a 86 y.o. female with stage III chronic kidney disease and anemia who is seen for 6 month assessment.   HPI: The patient was last seen in the hematology clinic on 12/20/2019. At that time, she felt pretty good.  Exam was stable. Hematocrit was 33.5, hemoglobin 10.5, platelets 202,000, WBC 4,100. Ferritin was 39 with an iron saturation of 15% and a TIBC of 318.  The patient saw Billey Chang, NP in the symptom management clinic on 02/28/2020 for weakness. She had not been eating much over the past couple of weeks. She reported 3 falls in the past week due to dizziness. She received IVF. Home health PT/OT was ordered for evaluation and management of weakness. She was referred to home health palliative care due to dementia.  She saw Dr. Delana Meyer on 04/03/2020. Duplex ultrasound showed a greater than 50% right renal artery stenosis. Abdominal aortic aneurysm measured 5.4 cm. Discussed endovascular repair of aneurysm.  She saw Dr. Holley Raring on 05/11/2020. Her chronic kidney disease was felt to be stable. Follow up was planned for 4 months.  She saw Eulogio Ditch, NP on 05/25/2020. Decision was made to treat her abdominal aortic aneurysm conservatively given her severe dementia and renal dysfunction. Treatment was recommended when her aneurysm was at least 5.5 cm.  Follow up was planned for 6 months.  The patient saw Clabe Seal, [place] (cardiology) on 05/31/2020. She continued Xarelto for stroke prevention and right lower extremity DVT. Follow up was planned for 6 months.  Labs on 03/21/2020 revealed a hematocrit of 32.1, hemoglobin 10.2, MCV 87.2, platelets 234,000, WBC 3,700 (ANC 2,000). Ferritin was 45 with an iron saturation of 19% and a TIBC of 291.  During the interim,  she has been "ok".  She has been experiencing dizziness for the past month. She denies any recent falls. Her blood pressure has been running high at home so she started on a new blood pressure medication. She is trying to eat well and has been drinking some Gatorade. She denies bleeding of any kind. She has been taking oral iron 3x/week and is still a little bit constipated.  She has also been weak and has shortness of breath on exertion. She gets tired when she walks around the house. She had PT/OT come to her house, which she enjoyed. Her daughter feels like it helped her. PT and OT are not coming anymore.   The patient's memory is "rocky." She denies hallucinations.  She is taking Xarelto.   Past Medical History:  Diagnosis Date  . Anemia   . Anxiety   . Arthritis   . Chronic kidney disease   . Coronary artery disease   . Depression   . Dizziness   . Dyspnea   . Dysrhythmia    gets tachy if anxious or excited  . GERD (gastroesophageal reflux disease)   . History of hiatal hernia   . HOH (hard of hearing)   . Hypertension   . Hypothyroidism   . IDA (iron deficiency anemia) 12/14/2014  . Myocardial infarction ([facility]) 1992  . Renal insufficiency   . Sleep apnea   . Tremors of nervous system     Past Surgical History:  Procedure Laterality Date  . CATARACT EXTRACTION W/PHACO Left 07/15/2017   Procedure: CATARACT EXTRACTION PHACO AND  INTRAOCULAR LENS PLACEMENT (IOC)-LEFT;  Surgeon: Birder Robson, MD;  Location: [facility] ORS;  Service: Ophthalmology;  Laterality: Left;  [place] 00.36.1 AP% 13.9 CDE 5.02 Fluid Pack lot # [id number]  . CATARACT EXTRACTION W/PHACO Right 08/26/2017   Procedure: CATARACT EXTRACTION PHACO AND INTRAOCULAR LENS PLACEMENT (IOC);  Surgeon: Birder Robson, MD;  Location: [facility] ORS;  Service: Ophthalmology;  Laterality: Right;  [place] 00:41.0 AP% 15.3 CDE 6.27 FLUID PACK LOT # [id number]  . CHOLECYSTECTOMY    . COLONOSCOPY WITH PROPOFOL N/A 04/10/2015   Procedure:  COLONOSCOPY WITH PROPOFOL;  Surgeon: Manya Silvas, MD;  Location: [facility] ENDOSCOPY;  Service: Endoscopy;  Laterality: N/A;  . ESOPHAGOGASTRODUODENOSCOPY (EGD) WITH PROPOFOL N/A 04/10/2015   Procedure: ESOPHAGOGASTRODUODENOSCOPY (EGD) WITH PROPOFOL;  Surgeon: Manya Silvas, MD;  Location: [facility] ENDOSCOPY;  Service: Endoscopy;  Laterality: N/A;  . fatty tumor removed on left shoulder    . gallbladdedr removed    . JOINT REPLACEMENT Bilateral    total knee replacements  . REPLACEMENT TOTAL KNEE BILATERAL Bilateral     Family History  Problem Relation Age of Onset  . Arthritis Mother   . Hypertension Mother   . Stroke Mother   . Hypertension Father   . Heart attack Father   . Breast cancer Neg Hx     Social History:  reports that she has never smoked. She has never used smokeless tobacco. She reports that she does not drink alcohol and does not use drugs. She lives at home alone. The patient is accompanied by her daughter today.   Allergies:  Allergies  Allergen Reactions  . Penicillins Rash and Other (See Comments)    Has patient had a PCN reaction causing immediate rash, facial/tongue/throat swelling, SOB or lightheadedness with hypotension: Unknown Has patient had a PCN reaction causing severe rash involving mucus membranes or skin necrosis: Unknown Has patient had a PCN reaction that required hospitalization: Unknown Has patient had a PCN reaction occurring within the last 10 years: Unknown If all of the above answers are "NO", then may proceed with Cephalosporin use.   . Nsaids Other (See Comments)    GI upset  . Rosuvastatin Other (See Comments)  . Atorvastatin Rash  . Celebrex [Celecoxib] Other (See Comments)    Constipation and stomach upset  . Felodipine Nausea Only  . Oxaprozin Nausea Only  . Rofecoxib Other (See Comments)    GI upset  . Simvastatin Rash    Current Medications: Current Outpatient Medications  Medication Sig Dispense Refill  . acetaminophen  (TYLENOL) 650 MG CR tablet Take 650 mg by mouth every 8 (eight) hours as needed for pain.     Marland Kitchen amLODipine (NORVASC) 5 MG tablet Take 5 mg by mouth daily.    . benazepril (LOTENSIN) 5 MG tablet Take 5 mg by mouth daily.    . calcitRIOL (ROCALTROL) 0.25 MCG capsule Take 1 capsule by mouth daily.    . Cholecalciferol (VITAMIN D3) 2000 units capsule Take 4,000 Units by mouth daily.    . cromolyn (OPTICROM) 4 % ophthalmic solution SMARTSIG:In Eye(s)    . diclofenac Sodium (VOLTAREN) 1 % GEL Apply 2 g topically as needed.     Marland Kitchen esomeprazole (NEXIUM) 40 MG capsule Take 40 mg by mouth daily as needed (acid reflux).     . Ferrous Sulfate (SLOW FE) 142 (45 Fe) MG TBCR Take 1 tablet by mouth 2 (two) times daily.     . hydrALAZINE (APRESOLINE) 25 MG tablet     . lactulose (CONSTULOSE)  10 GM/15ML solution Take 10 g by mouth daily as needed for mild constipation.     Marland Kitchen loratadine (CLARITIN) 10 MG tablet Take 1 tablet (10 mg total) by mouth daily. (Patient taking differently: Take 10 mg by mouth daily as needed. ) 30 tablet 0  . metoprolol succinate (TOPROL-XL) 50 MG 24 hr tablet Take 1 tablet (50 mg total) by mouth daily. Take with or immediately following a meal. (Patient taking differently: Take 25 mg by mouth daily. Take with or immediately following a meal.) 30 tablet 0  . mometasone (NASONEX) 50 MCG/ACT nasal spray Place 2 sprays into the nose daily as needed (allergies).     . olmesartan (BENICAR) 20 MG tablet Take 20 mg by mouth daily.     Marland Kitchen olopatadine (PATANOL) 0.1 % ophthalmic solution Place 1 drop into both eyes 2 (two) times daily as needed for allergies.     . polyethylene glycol (MIRALAX / GLYCOLAX) packet Take 17 g by mouth daily as needed for mild constipation or moderate constipation.    . risperiDONE (RISPERDAL) 0.25 MG tablet Take 1 tablet by mouth daily.    . rivaroxaban (XARELTO) 20 MG TABS tablet Take 1 tablet (20 mg total) by mouth daily with supper. 30 tablet 0  . senna-docusate  (SENOKOT-S) 8.6-50 MG tablet Take 1 tablet by mouth 2 (two) times daily.    Marland Kitchen amLODipine (NORVASC) 10 MG tablet  (Patient not taking: Reported on 06/21/2020)    . CYANOCOBALAMIN PO Take 1,000 mcg by mouth daily. (Patient not taking: Reported on 06/21/2020)    . docusate sodium (COLACE) 100 MG capsule Take 100 mg by mouth 2 (two) times daily.  (Patient not taking: Reported on 06/21/2020)    . QUEtiapine (SEROQUEL) 50 MG tablet  (Patient not taking: Reported on 06/21/2020)     No current facility-administered medications for this visit.   Facility-Administered Medications Ordered in Other Visits  Medication Dose Route Frequency Provider Last Rate Last Admin  . 0.9 %  sodium chloride infusion   Intravenous Continuous Lequita Asal, MD   Stopped at 02/06/18 1429    Review of Systems  Constitutional: Positive for malaise/fatigue. Negative for chills, diaphoresis, fever and weight loss (stable).  HENT: Positive for hearing loss. Negative for congestion, ear discharge, ear pain, nosebleeds, sinus pain, sore throat and tinnitus.   Eyes: Negative.  Negative for blurred vision, double vision and photophobia.  Respiratory: Positive for shortness of breath (exertional). Negative for cough, hemoptysis and sputum production.   Cardiovascular: Negative.  Negative for chest pain, palpitations and leg swelling.  Gastrointestinal: Positive for constipation (mild). Negative for abdominal pain, blood in stool, diarrhea, heartburn, melena, nausea and vomiting.  Genitourinary: Negative.  Negative for dysuria, frequency, hematuria and urgency.  Musculoskeletal: Negative for back pain, joint pain, myalgias and neck pain.  Skin: Negative.  Negative for itching and rash.  Neurological: Positive for weakness. Negative for dizziness, tingling, sensory change and headaches (after COVID vaccine on 09/17/2019).  Endo/Heme/Allergies: Negative.  Does not bruise/bleed easily.  Psychiatric/Behavioral: Positive for memory  loss. Negative for depression and hallucinations. The patient is not nervous/anxious and does not have insomnia.   All other systems reviewed and are negative.   Performance status (ECOG): 2-3  Vitals Blood pressure (!) 143/80, pulse 67, temperature (!) 96.8 F (36 C), temperature source Tympanic, resp. rate 18, weight 243 lb (110.2 kg), SpO2 100 %.   Physical Exam Vitals and nursing note reviewed.  Constitutional:      Appearance:  She is well-developed. She is not diaphoretic.     Comments: Rolling walker by her side. Requires assistance onto exam table.  HENT:     Head: Normocephalic and atraumatic.     Mouth/Throat:     Mouth: Mucous membranes are moist.     Pharynx: Oropharynx is clear.  Eyes:     General: No scleral icterus.    Conjunctiva/sclera: Conjunctivae normal.     Pupils: Pupils are equal, round, and reactive to light.     Comments: Brown eyes.  Neck:     Vascular: No JVD.  Cardiovascular:     Rate and Rhythm: Normal rate and regular rhythm.     Heart sounds: Normal heart sounds. No murmur heard.   Pulmonary:     Effort: Pulmonary effort is normal. No respiratory distress.     Breath sounds: Normal breath sounds. No wheezing or rales.  Chest:     Chest wall: No tenderness.  Abdominal:     General: Bowel sounds are normal. There is no distension.     Palpations: Abdomen is soft. There is no mass.     Tenderness: There is no abdominal tenderness. There is no guarding or rebound.  Musculoskeletal:        General: Tenderness (BLE) present. Normal range of motion.     Cervical back: Neck supple.     Right lower leg: Edema (chronic) present.     Left lower leg: Edema (chronic) present.  Lymphadenopathy:     Head:     Right side of head: No preauricular, posterior auricular or occipital adenopathy.     Left side of head: No preauricular, posterior auricular or occipital adenopathy.     Cervical: No cervical adenopathy.     Upper Body:     Right upper body: No  supraclavicular or axillary adenopathy.     Left upper body: No supraclavicular or axillary adenopathy.     Lower Body: No right inguinal adenopathy. No left inguinal adenopathy.  Skin:    General: Skin is warm and dry.     Coloration: Skin is not pale.     Findings: No erythema or rash.  Neurological:     Mental Status: She is alert and oriented to person, place, and time.  Psychiatric:        Behavior: Behavior normal.        Thought Content: Thought content normal.        Judgment: Judgment normal.    Appointment on 06/21/2020  Component Date Value Ref Range Status  . WBC 06/21/2020 3.8* 4.0 - 10.5 K/uL Final  . RBC 06/21/2020 3.58* 3.87 - 5.11 MIL/uL Final  . Hemoglobin 06/21/2020 9.7* 12.0 - 15.0 g/dL Final  . HCT 06/21/2020 31.1* 36 - 46 % Final  . MCV 06/21/2020 86.9  80.0 - 100.0 fL Final  . MCH 06/21/2020 27.1  26.0 - 34.0 pg Final  . MCHC 06/21/2020 31.2  30.0 - 36.0 g/dL Final  . RDW 06/21/2020 14.9  11.5 - 15.5 % Final  . Platelets 06/21/2020 210  150 - 400 K/uL Final  . nRBC 06/21/2020 0.0  0.0 - 0.2 % Final  . Neutrophils Relative % 06/21/2020 61  % Final  . Neutro Abs 06/21/2020 2.3  1.7 - 7.7 K/uL Final  . Lymphocytes Relative 06/21/2020 25  % Final  . Lymphs Abs 06/21/2020 1.0  0.7 - 4.0 K/uL Final  . Monocytes Relative 06/21/2020 10  % Final  . Monocytes Absolute 06/21/2020 0.4  0.1 - 1.0 K/uL Final  . Eosinophils Relative 06/21/2020 2  % Final  . Eosinophils Absolute 06/21/2020 0.1  0.0 - 0.5 K/uL Final  . Basophils Relative 06/21/2020 1  % Final  . Basophils Absolute 06/21/2020 0.0  0.0 - 0.1 K/uL Final  . Immature Granulocytes 06/21/2020 1  % Final  . Abs Immature Granulocytes 06/21/2020 0.02  0.00 - 0.07 K/uL Final   Performed at [facility], [address], [place], [place] [address]    Assessment:  Nicole Bishop is a 86 y.o. female African American woman with stage III chronic kidney diseaseandiron deficiency. Hematocrit and MCV  were normal on 09/03/2013.   She denies any GI bleeding. Dietis modest. EGDon 04/10/2015 revealed a large hiatal hernia and gastritis. Biopsy was unremarkable. Colonoscopyon 04/10/2015 revealed diverticulosis in the sigmoid, descending, transverse, and ascending colon. She had internal hemorrhoids.  She hasB12 deficiency. She received monthly injections at [facility] (last 07/2016). She began oral B12in 08/2016. B12 was 748 on 11/08/2019.  Folate was 14.2 on 09/20/2019.   Labs on 01/25/2016revealed a hematocrit of 30 with an MCV of 67. Ferritin was 6 (low) with a TIBC of 487 (high). Normal studies included direct Coombs, folic acid, W65, haptoglobin, erythropoietin, and SPEP.   Labs on 09/20/2019 revealed a hematocrit 34.1, hemoglobin 10.8, MCV 88.6, platelets 214,000, WBC 4,200. Ferritin was 29 with an iron saturation of 12% and a TIBC of 330. Retic was 1.6%. B12, folate, and TSH were normal.  Sed rate 29.   She has received weekly Venofer: 800 mg(10/21/2014 - 11/11/2014), 400 mg (03/03/2015 and 03/10/2015), 400 mg (10/20/2015 and 10/27/2015), 200 mg (04/16/2016), 600 mg (11/11/2016 - 11/25/2016), 600 mg (05/23/2017- 06/06/2017), 400 mg (02/06/2018 - 02/13/2018), and 200 mg (06/11/2018), and 400 mg (09/27/2019 - 10/04/2019).She receives Venofer if her ferritin is < 30.   Ferritinhas been followed: 14 on 12/28/2014, 9 on 02/23/2015, 113 on 03/17/2015, 12 on 10/13/2015, 31 on 01/10/2016, 19 on 04/11/2016, 23 on 07/18/2016, 9 on 11/11/2016, 9 on 05/13/2017, 46 on 08/07/2017, 41 on 11/10/2017, 32 on 02/03/2018, 132 on 05/11/2018, 77 on 06/11/2018, 29 on 09/20/2019, and 50 on 11/08/2019. Goal ferritinis 100.  She was admitted to [facility] 06/09/2018 -10/25/2019with a right lower extremity DVT.  Duplex revealed an occlusive thrombus in the proximal and mid right femoral vein with nonocclusive thrombus in the distal femoral vein and right popliteal vein.  She was  discharged on Xarelto.   She received COVID vaccine on 09/17/2019.   Symptomatically, she feels "ok".  She has been experiencing dizziness for the past month. She denies any recent falls. Her blood pressure has been running high at home so she started on a new blood pressure medication. She is trying to eat well and has been drinking some Gatorade. She denies bleeding of any kind. She has been taking oral iron 3x/week and is still a little bit constipated.  Exam is stable.  Plan: 1.   Labs today: CBC with diff, BMP, ferritin, iron studies. 2.   Iron deficiency anemia  Hematocrit 32.1.  Hemoglobin 10.2.  MCV 87.2 on 03/21/2020.   Ferritin 45 with an iron saturation 19% and a TIBC 291.  Hematocrit 31.1.  Hemoglobin   9.7. MCV 86.9 on 06/21/2020   Ferritin 30 with an iron saturation 14% and a TIBC of 337.  She is on oral iron 3 times a week.             Ferritin goal 100.  She has received IV iron in the past (last 10/04/2019).             Continue to monitor. 3.   B12 deficiency  Patient on B12 three days a week.  B12 level 2210 on 09/20/2019 and 748 on 11/08/2019.  B12 goal 400.  Continue to monitor. 4.   RIGHTlower extremity DVT Patient remains on Xarelto. Patient denies any bleeding. 5.   Stage III chronic kidney disease Creatinine 1.65 (CrCl 33.3 ml/min). Consider Retacrit if hemoglobin < 10 and iron stores replete. 6.   Weight loss             Weight remains stable.    Continue to monitor. 7.   RN to call patient with iron studies (and if Venofer needed). 8.   RTC in 2 months for labs (CBC with diff, ferritin, iron studies). 9.   RTC in 4 months for MD assessment, labs (CBC with diff, Cr, ferritin, iron studies).  I discussed the assessment and treatment plan with the patient.  The patient was provided an opportunity to ask questions and all were answered.  The patient agreed with the plan and demonstrated an understanding of the instructions.  The patient was  advised to call back if the symptoms worsen or if the condition fails to improve as anticipated.  I provided 16 minutes of face-to-face time during this this encounter and > 50% was spent counseling as documented under my assessment and plan.  An additional 10 minutes were spent reviewing her chart (Epic and Care Everywhere) including notes, labs, and imaging studies.   Lequita Asal, MD, PhD    06/21/2020, 11:50 AM  I, Mirian Mo Tufford, am acting as a Education administrator for [employer]. Mike Gip, MD.   I, Evanie Buckle C. Mike Gip, MD, have reviewed the above documentation for accuracy and completeness, and I agree with the above.

## 2020-06-21 ENCOUNTER — Inpatient Hospital Stay: Payer: Medicare HMO

## 2020-06-21 ENCOUNTER — Inpatient Hospital Stay: Payer: Medicare HMO | Attending: Hematology and Oncology | Admitting: Hematology and Oncology

## 2020-06-21 ENCOUNTER — Other Ambulatory Visit: Payer: Self-pay

## 2020-06-21 ENCOUNTER — Encounter: Payer: Self-pay | Admitting: Hematology and Oncology

## 2020-06-21 VITALS — BP 143/80 | HR 67 | Temp 96.8°F | Resp 18 | Wt 243.0 lb

## 2020-06-21 DIAGNOSIS — Z7901 Long term (current) use of anticoagulants: Secondary | ICD-10-CM | POA: Insufficient documentation

## 2020-06-21 DIAGNOSIS — I714 Abdominal aortic aneurysm, without rupture: Secondary | ICD-10-CM | POA: Diagnosis not present

## 2020-06-21 DIAGNOSIS — N179 Acute kidney failure, unspecified: Secondary | ICD-10-CM

## 2020-06-21 DIAGNOSIS — N1832 Chronic kidney disease, stage 3b: Secondary | ICD-10-CM

## 2020-06-21 DIAGNOSIS — R531 Weakness: Secondary | ICD-10-CM | POA: Diagnosis not present

## 2020-06-21 DIAGNOSIS — R0602 Shortness of breath: Secondary | ICD-10-CM | POA: Diagnosis not present

## 2020-06-21 DIAGNOSIS — R42 Dizziness and giddiness: Secondary | ICD-10-CM | POA: Diagnosis not present

## 2020-06-21 DIAGNOSIS — K5900 Constipation, unspecified: Secondary | ICD-10-CM | POA: Insufficient documentation

## 2020-06-21 DIAGNOSIS — E538 Deficiency of other specified B group vitamins: Secondary | ICD-10-CM | POA: Insufficient documentation

## 2020-06-21 DIAGNOSIS — N183 Chronic kidney disease, stage 3 unspecified: Secondary | ICD-10-CM | POA: Diagnosis not present

## 2020-06-21 DIAGNOSIS — Z86718 Personal history of other venous thrombosis and embolism: Secondary | ICD-10-CM | POA: Diagnosis not present

## 2020-06-21 DIAGNOSIS — F0390 Unspecified dementia without behavioral disturbance: Secondary | ICD-10-CM | POA: Diagnosis not present

## 2020-06-21 DIAGNOSIS — D509 Iron deficiency anemia, unspecified: Secondary | ICD-10-CM | POA: Insufficient documentation

## 2020-06-21 DIAGNOSIS — R634 Abnormal weight loss: Secondary | ICD-10-CM | POA: Diagnosis not present

## 2020-06-21 DIAGNOSIS — I129 Hypertensive chronic kidney disease with stage 1 through stage 4 chronic kidney disease, or unspecified chronic kidney disease: Secondary | ICD-10-CM | POA: Diagnosis not present

## 2020-06-21 DIAGNOSIS — I82511 Chronic embolism and thrombosis of right femoral vein: Secondary | ICD-10-CM

## 2020-06-21 LAB — CBC WITH DIFFERENTIAL/PLATELET
Abs Immature Granulocytes: 0.02 10*3/uL (ref 0.00–0.07)
Basophils Absolute: 0 10*3/uL (ref 0.0–0.1)
Basophils Relative: 1 %
Eosinophils Absolute: 0.1 10*3/uL (ref 0.0–0.5)
Eosinophils Relative: 2 %
HCT: 31.1 % — ABNORMAL LOW (ref 36.0–46.0)
Hemoglobin: 9.7 g/dL — ABNORMAL LOW (ref 12.0–15.0)
Immature Granulocytes: 1 %
Lymphocytes Relative: 25 %
Lymphs Abs: 1 10*3/uL (ref 0.7–4.0)
MCH: 27.1 pg (ref 26.0–34.0)
MCHC: 31.2 g/dL (ref 30.0–36.0)
MCV: 86.9 fL (ref 80.0–100.0)
Monocytes Absolute: 0.4 10*3/uL (ref 0.1–1.0)
Monocytes Relative: 10 %
Neutro Abs: 2.3 10*3/uL (ref 1.7–7.7)
Neutrophils Relative %: 61 %
Platelets: 210 10*3/uL (ref 150–400)
RBC: 3.58 MIL/uL — ABNORMAL LOW (ref 3.87–5.11)
RDW: 14.9 % (ref 11.5–15.5)
WBC: 3.8 10*3/uL — ABNORMAL LOW (ref 4.0–10.5)
nRBC: 0 % (ref 0.0–0.2)

## 2020-06-21 LAB — IRON AND TIBC
Iron: 48 ug/dL (ref 28–170)
Saturation Ratios: 14 % (ref 10.4–31.8)
TIBC: 337 ug/dL (ref 250–450)
UIBC: 289 ug/dL

## 2020-06-21 LAB — BASIC METABOLIC PANEL
Anion gap: 11 (ref 5–15)
BUN: 31 mg/dL — ABNORMAL HIGH (ref 8–23)
CO2: 24 mmol/L (ref 22–32)
Calcium: 9.3 mg/dL (ref 8.9–10.3)
Chloride: 105 mmol/L (ref 98–111)
Creatinine, Ser: 1.65 mg/dL — ABNORMAL HIGH (ref 0.44–1.00)
GFR, Estimated: 30 mL/min — ABNORMAL LOW (ref >60)
Glucose, Bld: 99 mg/dL (ref 70–99)
Potassium: 4.1 mmol/L (ref 3.5–5.1)
Sodium: 140 mmol/L (ref 135–145)

## 2020-06-21 LAB — FERRITIN: Ferritin: 30 ng/mL (ref 11–307)

## 2020-06-21 NOTE — Progress Notes (Signed)
Patient reports feeling dizzy and week and fatigued.  Sitting bp 143/80 HR 67 Standing bp 113/60 HR 72

## 2020-06-29 DIAGNOSIS — Z79899 Other long term (current) drug therapy: Secondary | ICD-10-CM | POA: Diagnosis not present

## 2020-06-29 DIAGNOSIS — I10 Essential (primary) hypertension: Secondary | ICD-10-CM | POA: Diagnosis not present

## 2020-06-29 DIAGNOSIS — I701 Atherosclerosis of renal artery: Secondary | ICD-10-CM | POA: Diagnosis not present

## 2020-06-29 DIAGNOSIS — F0151 Vascular dementia with behavioral disturbance: Secondary | ICD-10-CM | POA: Diagnosis not present

## 2020-06-29 DIAGNOSIS — I5032 Chronic diastolic (congestive) heart failure: Secondary | ICD-10-CM | POA: Diagnosis not present

## 2020-06-29 DIAGNOSIS — D649 Anemia, unspecified: Secondary | ICD-10-CM | POA: Diagnosis not present

## 2020-06-29 DIAGNOSIS — K219 Gastro-esophageal reflux disease without esophagitis: Secondary | ICD-10-CM | POA: Diagnosis not present

## 2020-06-29 DIAGNOSIS — R269 Unspecified abnormalities of gait and mobility: Secondary | ICD-10-CM | POA: Diagnosis not present

## 2020-06-29 DIAGNOSIS — I714 Abdominal aortic aneurysm, without rupture: Secondary | ICD-10-CM | POA: Diagnosis not present

## 2020-07-06 DIAGNOSIS — I714 Abdominal aortic aneurysm, without rupture: Secondary | ICD-10-CM | POA: Diagnosis not present

## 2020-07-06 DIAGNOSIS — N184 Chronic kidney disease, stage 4 (severe): Secondary | ICD-10-CM | POA: Diagnosis not present

## 2020-07-06 DIAGNOSIS — I5032 Chronic diastolic (congestive) heart failure: Secondary | ICD-10-CM | POA: Diagnosis not present

## 2020-07-06 DIAGNOSIS — Z79899 Other long term (current) drug therapy: Secondary | ICD-10-CM | POA: Diagnosis not present

## 2020-07-06 DIAGNOSIS — F22 Delusional disorders: Secondary | ICD-10-CM | POA: Diagnosis not present

## 2020-07-06 DIAGNOSIS — D649 Anemia, unspecified: Secondary | ICD-10-CM | POA: Diagnosis not present

## 2020-07-06 DIAGNOSIS — I130 Hypertensive heart and chronic kidney disease with heart failure and stage 1 through stage 4 chronic kidney disease, or unspecified chronic kidney disease: Secondary | ICD-10-CM | POA: Diagnosis not present

## 2020-07-06 DIAGNOSIS — F0151 Vascular dementia with behavioral disturbance: Secondary | ICD-10-CM | POA: Diagnosis not present

## 2020-07-06 DIAGNOSIS — M19011 Primary osteoarthritis, right shoulder: Secondary | ICD-10-CM | POA: Diagnosis not present

## 2020-07-27 DIAGNOSIS — M19012 Primary osteoarthritis, left shoulder: Secondary | ICD-10-CM | POA: Diagnosis not present

## 2020-07-27 DIAGNOSIS — M19011 Primary osteoarthritis, right shoulder: Secondary | ICD-10-CM | POA: Diagnosis not present

## 2020-07-27 DIAGNOSIS — M25512 Pain in left shoulder: Secondary | ICD-10-CM | POA: Diagnosis not present

## 2020-07-27 DIAGNOSIS — M25511 Pain in right shoulder: Secondary | ICD-10-CM | POA: Diagnosis not present

## 2020-08-02 DIAGNOSIS — G8929 Other chronic pain: Secondary | ICD-10-CM | POA: Diagnosis not present

## 2020-08-02 DIAGNOSIS — M25512 Pain in left shoulder: Secondary | ICD-10-CM | POA: Diagnosis not present

## 2020-08-02 DIAGNOSIS — M25511 Pain in right shoulder: Secondary | ICD-10-CM | POA: Diagnosis not present

## 2020-08-02 DIAGNOSIS — M19011 Primary osteoarthritis, right shoulder: Secondary | ICD-10-CM | POA: Diagnosis not present

## 2020-08-02 DIAGNOSIS — M19012 Primary osteoarthritis, left shoulder: Secondary | ICD-10-CM | POA: Diagnosis not present

## 2020-08-21 ENCOUNTER — Other Ambulatory Visit: Payer: Self-pay | Admitting: Internal Medicine

## 2020-08-21 DIAGNOSIS — Z1231 Encounter for screening mammogram for malignant neoplasm of breast: Secondary | ICD-10-CM

## 2020-08-23 ENCOUNTER — Inpatient Hospital Stay: Payer: Medicare HMO | Attending: Hematology and Oncology

## 2020-08-23 DIAGNOSIS — I714 Abdominal aortic aneurysm, without rupture: Secondary | ICD-10-CM | POA: Insufficient documentation

## 2020-08-23 DIAGNOSIS — R0602 Shortness of breath: Secondary | ICD-10-CM | POA: Insufficient documentation

## 2020-08-23 DIAGNOSIS — R42 Dizziness and giddiness: Secondary | ICD-10-CM | POA: Insufficient documentation

## 2020-08-23 DIAGNOSIS — D509 Iron deficiency anemia, unspecified: Secondary | ICD-10-CM | POA: Insufficient documentation

## 2020-08-23 DIAGNOSIS — Z7901 Long term (current) use of anticoagulants: Secondary | ICD-10-CM | POA: Insufficient documentation

## 2020-08-23 DIAGNOSIS — E538 Deficiency of other specified B group vitamins: Secondary | ICD-10-CM | POA: Insufficient documentation

## 2020-08-23 DIAGNOSIS — I129 Hypertensive chronic kidney disease with stage 1 through stage 4 chronic kidney disease, or unspecified chronic kidney disease: Secondary | ICD-10-CM | POA: Insufficient documentation

## 2020-08-23 DIAGNOSIS — R634 Abnormal weight loss: Secondary | ICD-10-CM | POA: Insufficient documentation

## 2020-08-23 DIAGNOSIS — N183 Chronic kidney disease, stage 3 unspecified: Secondary | ICD-10-CM | POA: Insufficient documentation

## 2020-08-23 DIAGNOSIS — K5900 Constipation, unspecified: Secondary | ICD-10-CM | POA: Insufficient documentation

## 2020-08-23 DIAGNOSIS — F0390 Unspecified dementia without behavioral disturbance: Secondary | ICD-10-CM | POA: Insufficient documentation

## 2020-08-23 DIAGNOSIS — Z86718 Personal history of other venous thrombosis and embolism: Secondary | ICD-10-CM | POA: Insufficient documentation

## 2020-08-23 DIAGNOSIS — R531 Weakness: Secondary | ICD-10-CM | POA: Insufficient documentation

## 2020-08-24 ENCOUNTER — Telehealth: Payer: Self-pay | Admitting: Hematology and Oncology

## 2020-08-25 NOTE — Telephone Encounter (Signed)
Terrie called to reschedule pt. appt. I scheduled appt. on 08/29/20@9 :45am. She verified that was ok.  By Aubery Lapping

## 2020-08-29 ENCOUNTER — Inpatient Hospital Stay: Payer: Medicare HMO

## 2020-08-29 ENCOUNTER — Other Ambulatory Visit: Payer: Self-pay

## 2020-08-29 DIAGNOSIS — Z7901 Long term (current) use of anticoagulants: Secondary | ICD-10-CM | POA: Diagnosis not present

## 2020-08-29 DIAGNOSIS — N183 Chronic kidney disease, stage 3 unspecified: Secondary | ICD-10-CM | POA: Diagnosis not present

## 2020-08-29 DIAGNOSIS — E538 Deficiency of other specified B group vitamins: Secondary | ICD-10-CM | POA: Diagnosis not present

## 2020-08-29 DIAGNOSIS — Z86718 Personal history of other venous thrombosis and embolism: Secondary | ICD-10-CM | POA: Diagnosis not present

## 2020-08-29 DIAGNOSIS — N1832 Chronic kidney disease, stage 3b: Secondary | ICD-10-CM | POA: Diagnosis not present

## 2020-08-29 DIAGNOSIS — R531 Weakness: Secondary | ICD-10-CM | POA: Diagnosis not present

## 2020-08-29 DIAGNOSIS — K5900 Constipation, unspecified: Secondary | ICD-10-CM | POA: Diagnosis not present

## 2020-08-29 DIAGNOSIS — D509 Iron deficiency anemia, unspecified: Secondary | ICD-10-CM

## 2020-08-29 DIAGNOSIS — D631 Anemia in chronic kidney disease: Secondary | ICD-10-CM | POA: Diagnosis not present

## 2020-08-29 DIAGNOSIS — R0602 Shortness of breath: Secondary | ICD-10-CM | POA: Diagnosis not present

## 2020-08-29 DIAGNOSIS — I10 Essential (primary) hypertension: Secondary | ICD-10-CM | POA: Diagnosis not present

## 2020-08-29 DIAGNOSIS — I129 Hypertensive chronic kidney disease with stage 1 through stage 4 chronic kidney disease, or unspecified chronic kidney disease: Secondary | ICD-10-CM | POA: Diagnosis not present

## 2020-08-29 DIAGNOSIS — I701 Atherosclerosis of renal artery: Secondary | ICD-10-CM | POA: Diagnosis not present

## 2020-08-29 DIAGNOSIS — N2581 Secondary hyperparathyroidism of renal origin: Secondary | ICD-10-CM | POA: Diagnosis not present

## 2020-08-29 DIAGNOSIS — F0390 Unspecified dementia without behavioral disturbance: Secondary | ICD-10-CM | POA: Diagnosis not present

## 2020-08-29 DIAGNOSIS — R634 Abnormal weight loss: Secondary | ICD-10-CM | POA: Diagnosis not present

## 2020-08-29 DIAGNOSIS — I714 Abdominal aortic aneurysm, without rupture: Secondary | ICD-10-CM | POA: Diagnosis not present

## 2020-08-29 DIAGNOSIS — R42 Dizziness and giddiness: Secondary | ICD-10-CM | POA: Diagnosis not present

## 2020-08-29 LAB — CBC WITH DIFFERENTIAL/PLATELET
Abs Immature Granulocytes: 0.01 10*3/uL (ref 0.00–0.07)
Basophils Absolute: 0.1 10*3/uL (ref 0.0–0.1)
Basophils Relative: 1 %
Eosinophils Absolute: 0.1 10*3/uL (ref 0.0–0.5)
Eosinophils Relative: 2 %
HCT: 32.5 % — ABNORMAL LOW (ref 36.0–46.0)
Hemoglobin: 10 g/dL — ABNORMAL LOW (ref 12.0–15.0)
Immature Granulocytes: 0 %
Lymphocytes Relative: 31 %
Lymphs Abs: 1.2 10*3/uL (ref 0.7–4.0)
MCH: 27 pg (ref 26.0–34.0)
MCHC: 30.8 g/dL (ref 30.0–36.0)
MCV: 87.6 fL (ref 80.0–100.0)
Monocytes Absolute: 0.4 10*3/uL (ref 0.1–1.0)
Monocytes Relative: 11 %
Neutro Abs: 2 10*3/uL (ref 1.7–7.7)
Neutrophils Relative %: 55 %
Platelets: 205 10*3/uL (ref 150–400)
RBC: 3.71 MIL/uL — ABNORMAL LOW (ref 3.87–5.11)
RDW: 15.5 % (ref 11.5–15.5)
WBC: 3.8 10*3/uL — ABNORMAL LOW (ref 4.0–10.5)
nRBC: 0 % (ref 0.0–0.2)

## 2020-08-29 LAB — IRON AND TIBC
Iron: 37 ug/dL (ref 28–170)
Saturation Ratios: 10 % — ABNORMAL LOW (ref 10.4–31.8)
TIBC: 356 ug/dL (ref 250–450)
UIBC: 319 ug/dL

## 2020-08-29 LAB — FERRITIN: Ferritin: 26 ng/mL (ref 11–307)

## 2020-08-30 ENCOUNTER — Telehealth: Payer: Self-pay

## 2020-08-30 NOTE — Telephone Encounter (Signed)
Daughter aware.

## 2020-08-30 NOTE — Telephone Encounter (Signed)
-----   Message from Lequita Asal, MD sent at 08/30/2020  8:44 AM EST ----- Regarding: FW: Please call patient  Please schedule Venofer weekly x 3.  M  ----- Message ----- From: Drue Dun, RN Sent: 08/29/2020   1:23 PM EST To: Lequita Asal, MD Subject: RE: Please call patient                        Patients daughter reports that patient has dizziness and sleeps a lot but she hasn't been around her this week, only talked to her on the phone. She is giving her iron twice a day every day. Patients daughter states she thinks she would benefit from the iron infusion due to dizziness  ----- Message ----- From: Lequita Asal, MD Sent: 08/29/2020  12:57 PM EST To: Drue Dun, RN Subject: Please call patient                             Hemoglobin 10.0.  Ferritin 26 on 08/29/2020.  Hemoglobin 9 .7.  Ferritin 30 on 06/21/2020.  How is she doing?  Still taking oral iron 3 x/day?   Continue oral iron.  If symptomatic consider IV iron.  M  ----- Message ----- From: Buel Ream, [facility] [place] Sent: 08/29/2020   9:59 AM EST To: Lequita Asal, MD

## 2020-09-04 ENCOUNTER — Inpatient Hospital Stay: Payer: Medicare HMO

## 2020-09-11 ENCOUNTER — Inpatient Hospital Stay: Payer: Medicare HMO

## 2020-09-11 ENCOUNTER — Other Ambulatory Visit: Payer: Self-pay

## 2020-09-11 VITALS — BP 164/94 | HR 64 | Resp 18

## 2020-09-11 DIAGNOSIS — F0390 Unspecified dementia without behavioral disturbance: Secondary | ICD-10-CM | POA: Diagnosis not present

## 2020-09-11 DIAGNOSIS — D509 Iron deficiency anemia, unspecified: Secondary | ICD-10-CM

## 2020-09-11 DIAGNOSIS — I714 Abdominal aortic aneurysm, without rupture: Secondary | ICD-10-CM | POA: Diagnosis not present

## 2020-09-11 DIAGNOSIS — R42 Dizziness and giddiness: Secondary | ICD-10-CM | POA: Diagnosis not present

## 2020-09-11 DIAGNOSIS — R0602 Shortness of breath: Secondary | ICD-10-CM | POA: Diagnosis not present

## 2020-09-11 DIAGNOSIS — R531 Weakness: Secondary | ICD-10-CM | POA: Diagnosis not present

## 2020-09-11 DIAGNOSIS — N183 Chronic kidney disease, stage 3 unspecified: Secondary | ICD-10-CM | POA: Diagnosis not present

## 2020-09-11 DIAGNOSIS — K5900 Constipation, unspecified: Secondary | ICD-10-CM | POA: Diagnosis not present

## 2020-09-11 DIAGNOSIS — I129 Hypertensive chronic kidney disease with stage 1 through stage 4 chronic kidney disease, or unspecified chronic kidney disease: Secondary | ICD-10-CM | POA: Diagnosis not present

## 2020-09-11 MED ORDER — IRON SUCROSE 20 MG/ML IV SOLN
200.0000 mg | Freq: Once | INTRAVENOUS | Status: AC
  Administered 2020-09-11: 200 mg via INTRAVENOUS

## 2020-09-11 MED ORDER — SODIUM CHLORIDE 0.9 % IV SOLN
INTRAVENOUS | Status: DC
  Filled 2020-09-11: qty 250

## 2020-09-11 MED ORDER — IRON SUCROSE 20 MG/ML IV SOLN
INTRAVENOUS | Status: AC
  Filled 2020-09-11: qty 10

## 2020-09-11 NOTE — Progress Notes (Signed)
Pt received prescribed treatment in clinic, pt stable at d/c. 

## 2020-09-12 ENCOUNTER — Telehealth: Payer: Self-pay | Admitting: Hematology and Oncology

## 2020-09-12 NOTE — Telephone Encounter (Signed)
Daughter called to reschedule patient's appt. due to her missing appt. due to inclement weather*r/s due to patient missing appt. on 09/04/20 due to inclement weather* nb Please schedule Venofer weekly x 3.  rgf.

## 2020-09-12 NOTE — Telephone Encounter (Signed)
Daughter called to reschedule appt. due to patient missing appointment on 09/04/20. *r/s due to patient missing appt. on 09/04/20 due to inclement weather.* nb Please schedule Venofer weekly x 3.  rgf

## 2020-09-12 NOTE — Telephone Encounter (Signed)
Daughter called to reschedule appt. Due to patient missing appt. On 09/04/20. *r/s due to patient missing appt. on 09/04/20 due to inclement weather* nb Please schedule Venofer weekly x 3.  rgf

## 2020-09-12 NOTE — Telephone Encounter (Signed)
Daughter called to r/s patient's appt. missed on 09/04/20 due to inclement weather. She is to have 3 Venofer treatments but missed one.

## 2020-09-18 ENCOUNTER — Inpatient Hospital Stay: Payer: Medicare HMO

## 2020-09-18 ENCOUNTER — Other Ambulatory Visit: Payer: Self-pay

## 2020-09-18 VITALS — BP 156/90 | HR 62 | Temp 96.2°F | Resp 18

## 2020-09-18 DIAGNOSIS — I714 Abdominal aortic aneurysm, without rupture: Secondary | ICD-10-CM | POA: Diagnosis not present

## 2020-09-18 DIAGNOSIS — K5900 Constipation, unspecified: Secondary | ICD-10-CM | POA: Diagnosis not present

## 2020-09-18 DIAGNOSIS — D509 Iron deficiency anemia, unspecified: Secondary | ICD-10-CM | POA: Diagnosis not present

## 2020-09-18 DIAGNOSIS — R42 Dizziness and giddiness: Secondary | ICD-10-CM | POA: Diagnosis not present

## 2020-09-18 DIAGNOSIS — R531 Weakness: Secondary | ICD-10-CM | POA: Diagnosis not present

## 2020-09-18 DIAGNOSIS — N183 Chronic kidney disease, stage 3 unspecified: Secondary | ICD-10-CM | POA: Diagnosis not present

## 2020-09-18 DIAGNOSIS — R0602 Shortness of breath: Secondary | ICD-10-CM | POA: Diagnosis not present

## 2020-09-18 DIAGNOSIS — F0390 Unspecified dementia without behavioral disturbance: Secondary | ICD-10-CM | POA: Diagnosis not present

## 2020-09-18 DIAGNOSIS — I129 Hypertensive chronic kidney disease with stage 1 through stage 4 chronic kidney disease, or unspecified chronic kidney disease: Secondary | ICD-10-CM | POA: Diagnosis not present

## 2020-09-18 MED ORDER — IRON SUCROSE 20 MG/ML IV SOLN
200.0000 mg | Freq: Once | INTRAVENOUS | Status: AC
  Administered 2020-09-18: 200 mg via INTRAVENOUS
  Filled 2020-09-18: qty 10

## 2020-09-18 MED ORDER — SODIUM CHLORIDE 0.9 % IV SOLN
Freq: Once | INTRAVENOUS | Status: AC
  Filled 2020-09-18: qty 250

## 2020-09-18 MED ORDER — IRON SUCROSE 20 MG/ML IV SOLN: 200.0000 mg | Freq: Once | INTRAVENOUS | Status: DC

## 2020-09-25 ENCOUNTER — Inpatient Hospital Stay: Payer: Medicare HMO

## 2020-10-02 ENCOUNTER — Other Ambulatory Visit: Payer: Self-pay

## 2020-10-02 ENCOUNTER — Inpatient Hospital Stay: Payer: Medicare HMO | Attending: Hematology and Oncology

## 2020-10-02 VITALS — BP 165/77 | HR 88 | Temp 96.0°F | Resp 18

## 2020-10-02 DIAGNOSIS — R42 Dizziness and giddiness: Secondary | ICD-10-CM | POA: Diagnosis not present

## 2020-10-02 DIAGNOSIS — E538 Deficiency of other specified B group vitamins: Secondary | ICD-10-CM | POA: Insufficient documentation

## 2020-10-02 DIAGNOSIS — R0602 Shortness of breath: Secondary | ICD-10-CM | POA: Diagnosis not present

## 2020-10-02 DIAGNOSIS — I129 Hypertensive chronic kidney disease with stage 1 through stage 4 chronic kidney disease, or unspecified chronic kidney disease: Secondary | ICD-10-CM | POA: Insufficient documentation

## 2020-10-02 DIAGNOSIS — K5900 Constipation, unspecified: Secondary | ICD-10-CM | POA: Diagnosis not present

## 2020-10-02 DIAGNOSIS — R531 Weakness: Secondary | ICD-10-CM | POA: Insufficient documentation

## 2020-10-02 DIAGNOSIS — R634 Abnormal weight loss: Secondary | ICD-10-CM | POA: Insufficient documentation

## 2020-10-02 DIAGNOSIS — Z86718 Personal history of other venous thrombosis and embolism: Secondary | ICD-10-CM | POA: Insufficient documentation

## 2020-10-02 DIAGNOSIS — F0390 Unspecified dementia without behavioral disturbance: Secondary | ICD-10-CM | POA: Insufficient documentation

## 2020-10-02 DIAGNOSIS — D509 Iron deficiency anemia, unspecified: Secondary | ICD-10-CM | POA: Insufficient documentation

## 2020-10-02 DIAGNOSIS — Z7901 Long term (current) use of anticoagulants: Secondary | ICD-10-CM | POA: Diagnosis not present

## 2020-10-02 DIAGNOSIS — I714 Abdominal aortic aneurysm, without rupture: Secondary | ICD-10-CM | POA: Diagnosis not present

## 2020-10-02 DIAGNOSIS — N183 Chronic kidney disease, stage 3 unspecified: Secondary | ICD-10-CM | POA: Insufficient documentation

## 2020-10-02 MED ORDER — SODIUM CHLORIDE 0.9 % IV SOLN
Freq: Once | INTRAVENOUS | Status: AC
  Filled 2020-10-02: qty 250

## 2020-10-02 MED ORDER — IRON SUCROSE 20 MG/ML IV SOLN
200.0000 mg | Freq: Once | INTRAVENOUS | Status: AC
  Administered 2020-10-02: 200 mg via INTRAVENOUS
  Filled 2020-10-02: qty 10

## 2020-10-02 NOTE — Progress Notes (Signed)
Patient received treatment in clinic.  Stable at discharge. 

## 2020-10-04 DIAGNOSIS — M79675 Pain in left toe(s): Secondary | ICD-10-CM | POA: Diagnosis not present

## 2020-10-04 DIAGNOSIS — M79674 Pain in right toe(s): Secondary | ICD-10-CM | POA: Diagnosis not present

## 2020-10-04 DIAGNOSIS — B351 Tinea unguium: Secondary | ICD-10-CM | POA: Diagnosis not present

## 2020-10-11 ENCOUNTER — Ambulatory Visit
Admission: RE | Admit: 2020-10-11 | Discharge: 2020-10-11 | Disposition: A | Discharge status: Home / Self Care | Payer: Medicare HMO | Source: Ambulatory Visit | Attending: Internal Medicine | Admitting: Internal Medicine

## 2020-10-11 ENCOUNTER — Other Ambulatory Visit: Payer: Self-pay

## 2020-10-11 DIAGNOSIS — Z1231 Encounter for screening mammogram for malignant neoplasm of breast: Secondary | ICD-10-CM | POA: Diagnosis not present

## 2020-10-17 NOTE — Progress Notes (Signed)
[facility]  [address], [address] [place], [place] [address] Phone: [phone]  Fax: [phone]   Clinic Day:  10/18/2020  Referring physician: Tracie Harrier, MD  Chief Complaint: Nicole Bishop is a 87 y.o. female with stage III chronic kidney disease and anemia who is seen for 4 month assessment.   HPI: The patient was last seen in the hematology clinic on 06/21/2020. At that time, she felt "ok".  She had been experiencing dizziness for the past month. She denied any recent falls. Her blood pressure had been running high at home so she started on a new blood pressure medication. She was trying to eat well and had been drinking some Gatorade. She denied bleeding of any kind.  Exam was stable. She continued vitamin B12 and oral iron three times per week. She continued Xarelto.  Labs at last visit revealed a hematocrit of 31.1, hemoglobin 9.7, MCV 86.9, platelets 210,000, WBC 3,800. Ferritin was 30 with an iron saturation of 14% and a TIBC of 337.  The patient saw Dr. Holley Raring on 08/29/2020. Her chronic kidney disease had been fluctuating. She was to continue Benicar 20 mg daily. Follow-up was planned for 4 months.  Screening bilateral mammogram on 10/11/2020 revealed no evidence of malignancy.  Labs on 08/29/2020 revealed a hematocrit of 32.5, hemoglobin 10.0, MCV 87.6, platelets 205,000, WBC 3,800. Ferritin was 26 with an iron saturation of 10% and a TIBC of 356.  She received Venofer on 09/11/2020, 09/18/2020, and 10/02/2020.  During the interim, she has been "pretty good."   She is no longer taking oral B12 three times per week because someone told her to stop. She will start again. She takes oral iron BID. She tolerated Venofer well and felt like it "pepped her up." Her energy is starting to drift down again. She has shortness of breath on exertion. She has constipation occasionally but does not like to take stool softeners. Her headaches have  resolved.  She has not been taking any new medications or herbal products.   Past Medical History:  Diagnosis Date  . Anemia   . Anxiety   . Arthritis   . Chronic kidney disease   . Coronary artery disease   . Depression   . Dizziness   . Dyspnea   . Dysrhythmia    gets tachy if anxious or excited  . GERD (gastroesophageal reflux disease)   . History of hiatal hernia   . HOH (hard of hearing)   . Hypertension   . Hypothyroidism   . IDA (iron deficiency anemia) 12/14/2014  . Myocardial infarction ([place]) 1992  . Renal insufficiency   . Sleep apnea   . Tremors of nervous system     Past Surgical History:  Procedure Laterality Date  . CATARACT EXTRACTION W/PHACO Left 07/15/2017   Procedure: CATARACT EXTRACTION PHACO AND INTRAOCULAR LENS PLACEMENT (IOC)-LEFT;  Surgeon: Birder Robson, MD;  Location: [facility] ORS;  Service: Ophthalmology;  Laterality: Left;  [place] 00.36.1 AP% 13.9 CDE 5.02 Fluid Pack lot # [id number]  . CATARACT EXTRACTION W/PHACO Right 08/26/2017   Procedure: CATARACT EXTRACTION PHACO AND INTRAOCULAR LENS PLACEMENT (IOC);  Surgeon: Birder Robson, MD;  Location: [facility] ORS;  Service: Ophthalmology;  Laterality: Right;  [place] 00:41.0 AP% 15.3 CDE 6.27 FLUID PACK LOT # [id number]  . CHOLECYSTECTOMY    . COLONOSCOPY WITH PROPOFOL N/A 04/10/2015   Procedure: COLONOSCOPY WITH PROPOFOL;  Surgeon: Manya Silvas, MD;  Location: [facility] ENDOSCOPY;  Service: Endoscopy;  Laterality: N/A;  .  ESOPHAGOGASTRODUODENOSCOPY (EGD) WITH PROPOFOL N/A 04/10/2015   Procedure: ESOPHAGOGASTRODUODENOSCOPY (EGD) WITH PROPOFOL;  Surgeon: Manya Silvas, MD;  Location: [facility] ENDOSCOPY;  Service: Endoscopy;  Laterality: N/A;  . fatty tumor removed on left shoulder    . gallbladdedr removed    . JOINT REPLACEMENT Bilateral    total knee replacements  . REPLACEMENT TOTAL KNEE BILATERAL Bilateral     Family History  Problem Relation Age of Onset  . Arthritis Mother   . Hypertension Mother   .  Stroke Mother   . Hypertension Father   . Heart attack Father   . Breast cancer Neg Hx     Social History:  reports that she has never smoked. She has never used smokeless tobacco. She reports that she does not drink alcohol and does not use drugs. She lives at home alone. The patient is accompanied by her daughter today.   Allergies:  Allergies  Allergen Reactions  . Penicillins Rash and Other (See Comments)    Has patient had a PCN reaction causing immediate rash, facial/tongue/throat swelling, SOB or lightheadedness with hypotension: Unknown Has patient had a PCN reaction causing severe rash involving mucus membranes or skin necrosis: Unknown Has patient had a PCN reaction that required hospitalization: Unknown Has patient had a PCN reaction occurring within the last 10 years: Unknown If all of the above answers are "NO", then may proceed with Cephalosporin use.   . Nsaids Other (See Comments)    GI upset  . Rosuvastatin Other (See Comments)  . Atorvastatin Rash  . Celebrex [Celecoxib] Other (See Comments)    Constipation and stomach upset  . Felodipine Nausea Only  . Oxaprozin Nausea Only  . Rofecoxib Other (See Comments)    GI upset  . Simvastatin Rash    Current Medications: Current Outpatient Medications  Medication Sig Dispense Refill  . acetaminophen (TYLENOL) 650 MG CR tablet Take 650 mg by mouth every 8 (eight) hours as needed for pain.     Marland Kitchen amLODipine (NORVASC) 5 MG tablet Take 5 mg by mouth daily.    . benazepril (LOTENSIN) 5 MG tablet Take 5 mg by mouth daily.    . calcitRIOL (ROCALTROL) 0.25 MCG capsule Take 1 capsule by mouth daily.    . Cholecalciferol (VITAMIN D3) 2000 units capsule Take 4,000 Units by mouth daily.    . cromolyn (OPTICROM) 4 % ophthalmic solution SMARTSIG:In Eye(s)    . esomeprazole (NEXIUM) 40 MG capsule Take 40 mg by mouth daily as needed (acid reflux).    . Ferrous Sulfate 142 (45 Fe) MG TBCR Take 1 tablet by mouth 2 (two) times daily.      . hydrALAZINE (APRESOLINE) 25 MG tablet     . lactulose (CHRONULAC) 10 GM/15ML solution Take 10 g by mouth daily as needed for mild constipation.     . metoprolol succinate (TOPROL-XL) 50 MG 24 hr tablet Take 1 tablet (50 mg total) by mouth daily. Take with or immediately following a meal. (Patient taking differently: Take 25 mg by mouth daily. Take with or immediately following a meal.) 30 tablet 0  . mometasone (NASONEX) 50 MCG/ACT nasal spray Place 2 sprays into the nose daily as needed (allergies).    Marland Kitchen olopatadine (PATANOL) 0.1 % ophthalmic solution Place 1 drop into both eyes 2 (two) times daily as needed for allergies.     . polyethylene glycol (MIRALAX / GLYCOLAX) packet Take 17 g by mouth daily as needed for mild constipation or moderate constipation.    Marland Kitchen  risperiDONE (RISPERDAL) 0.25 MG tablet Take 1 tablet by mouth daily.    . rivaroxaban (XARELTO) 20 MG TABS tablet Take 1 tablet (20 mg total) by mouth daily with supper. 30 tablet 0  . senna-docusate (SENOKOT-S) 8.6-50 MG tablet Take 1 tablet by mouth 2 (two) times daily.    Marland Kitchen docusate sodium (COLACE) 100 MG capsule Take 100 mg by mouth 2 (two) times daily.  (Patient not taking: No sig reported)    . loratadine (CLARITIN) 10 MG tablet Take 1 tablet (10 mg total) by mouth daily. (Patient not taking: Reported on 10/18/2020) 30 tablet 0  . olmesartan (BENICAR) 20 MG tablet Take 20 mg by mouth daily.      No current facility-administered medications for this visit.   Facility-Administered Medications Ordered in Other Visits  Medication Dose Route Frequency Provider Last Rate Last Admin  . 0.9 %  sodium chloride infusion   Intravenous Continuous Lequita Asal, MD   Stopped at 02/06/18 1429    Review of Systems  Constitutional: Positive for malaise/fatigue and weight loss (3 lbs). Negative for chills, diaphoresis and fever.  HENT: Negative for congestion, ear discharge, ear pain, nosebleeds, sinus pain, sore throat and tinnitus.    Eyes: Negative.  Negative for blurred vision, double vision and photophobia.  Respiratory: Positive for shortness of breath (exertional). Negative for cough, hemoptysis and sputum production.   Cardiovascular: Negative.  Negative for chest pain, palpitations and leg swelling.  Gastrointestinal: Positive for constipation. Negative for abdominal pain, blood in stool, diarrhea, heartburn, melena, nausea and vomiting.  Genitourinary: Negative.  Negative for dysuria, frequency, hematuria and urgency.  Musculoskeletal: Negative for back pain, joint pain, myalgias and neck pain.  Skin: Negative.  Negative for itching and rash.  Neurological: Negative for dizziness, tingling, sensory change, weakness and headaches.  Endo/Heme/Allergies: Negative.  Does not bruise/bleed easily.  Psychiatric/Behavioral: Positive for memory loss. Negative for depression and hallucinations. The patient is not nervous/anxious and does not have insomnia.   All other systems reviewed and are negative.   Performance status (ECOG): 2  Vital Signs Blood pressure (!) 147/94, pulse 69, resp. rate 18, weight 240 lb 4.8 oz (109 kg), SpO2 100 %.   Physical Exam Vitals and nursing note reviewed.  Constitutional:      Appearance: She is well-developed. She is not diaphoretic.     Comments: Rolling walker by her side. Requires assistance onto exam table.  HENT:     Head: Normocephalic and atraumatic.     Mouth/Throat:     Mouth: Mucous membranes are moist.     Pharynx: Oropharynx is clear.  Eyes:     General: No scleral icterus.    Extraocular Movements: Extraocular movements intact.     Conjunctiva/sclera: Conjunctivae normal.     Pupils: Pupils are equal, round, and reactive to light.     Comments: Brown eyes.  Neck:     Vascular: No JVD.  Cardiovascular:     Rate and Rhythm: Normal rate and regular rhythm.     Heart sounds: Normal heart sounds. No murmur heard.   Pulmonary:     Effort: Pulmonary effort is normal.  No respiratory distress.     Breath sounds: Normal breath sounds. No wheezing or rales.  Chest:     Chest wall: No tenderness.  Breasts:     Right: No axillary adenopathy or supraclavicular adenopathy.     Left: No axillary adenopathy or supraclavicular adenopathy.    Abdominal:     General: Bowel  sounds are normal. There is no distension.     Palpations: Abdomen is soft. There is no mass.     Tenderness: There is no abdominal tenderness. There is no guarding or rebound.  Musculoskeletal:        General: No swelling or tenderness. Normal range of motion.     Cervical back: Neck supple.  Lymphadenopathy:     Head:     Right side of head: No preauricular, posterior auricular or occipital adenopathy.     Left side of head: No preauricular, posterior auricular or occipital adenopathy.     Cervical: No cervical adenopathy.     Upper Body:     Right upper body: No supraclavicular or axillary adenopathy.     Left upper body: No supraclavicular or axillary adenopathy.     Lower Body: No right inguinal adenopathy. No left inguinal adenopathy.  Skin:    General: Skin is warm and dry.     Coloration: Skin is not pale.     Findings: No erythema or rash.  Neurological:     Mental Status: She is alert and oriented to person, place, and time.  Psychiatric:        Behavior: Behavior normal.        Thought Content: Thought content normal.        Judgment: Judgment normal.    Appointment on 10/18/2020  Component Date Value Ref Range Status  . Creatinine, Ser 10/18/2020 1.84* 0.44 - 1.00 mg/dL Final  . GFR, Estimated 10/18/2020 26* >60 mL/min Final   Comment: (NOTE) Calculated using the CKD-EPI Creatinine Equation (2021) Performed at [facility], [address] [place], [place] [address]   . WBC 10/18/2020 3.3* 4.0 - 10.5 K/uL Final  . RBC 10/18/2020 4.11  3.87 - 5.11 MIL/uL Final  . Hemoglobin 10/18/2020 11.3* 12.0 - 15.0 g/dL Final  . HCT 10/18/2020 36.3  36.0 - 46.0 %  Final  . MCV 10/18/2020 88.3  80.0 - 100.0 fL Final  . MCH 10/18/2020 27.5  26.0 - 34.0 pg Final  . MCHC 10/18/2020 31.1  30.0 - 36.0 g/dL Final  . RDW 10/18/2020 15.9* 11.5 - 15.5 % Final  . Platelets 10/18/2020 228  150 - 400 K/uL Final  . nRBC 10/18/2020 0.0  0.0 - 0.2 % Final  . Neutrophils Relative % 10/18/2020 51  % Final  . Neutro Abs 10/18/2020 1.7  1.7 - 7.7 K/uL Final  . Lymphocytes Relative 10/18/2020 35  % Final  . Lymphs Abs 10/18/2020 1.1  0.7 - 4.0 K/uL Final  . Monocytes Relative 10/18/2020 11  % Final  . Monocytes Absolute 10/18/2020 0.4  0.1 - 1.0 K/uL Final  . Eosinophils Relative 10/18/2020 2  % Final  . Eosinophils Absolute 10/18/2020 0.1  0.0 - 0.5 K/uL Final  . Basophils Relative 10/18/2020 1  % Final  . Basophils Absolute 10/18/2020 0.0  0.0 - 0.1 K/uL Final  . Immature Granulocytes 10/18/2020 0  % Final  . Abs Immature Granulocytes 10/18/2020 0.01  0.00 - 0.07 K/uL Final   Performed at [facility], [address] [place], [place] [address]    Assessment:  Nicole Bishop is a 87 y.o. female African American woman with stage III chronic kidney diseaseandiron deficiency. Hematocrit and MCV were normal on 09/03/2013.   She denies any GI bleeding. Dietis modest. EGDon 04/10/2015 revealed a large hiatal hernia and gastritis. Biopsy was unremarkable. Colonoscopyon 04/10/2015 revealed diverticulosis in the sigmoid, descending, transverse,  and ascending colon. She had internal hemorrhoids.  She hasB12 deficiency. She received monthly injections at the [facility] (last 07/2016). She began oral B12in 08/2016. B12 was 748 on 11/08/2019.  Folate was 14.2 on 09/20/2019.   Labs on 01/25/2016revealed a hematocrit of 30 with an MCV of 67. Ferritin was 6 (low) with a TIBC of 487 (high). Normal studies included direct [place], folic acid, E42, haptoglobin, erythropoietin, and SPEP.   Labs on 09/20/2019 revealed a hematocrit 34.1,  hemoglobin 10.8, MCV 88.6, platelets 214,000, WBC 4,200. Ferritin was 29 with an iron saturation of 12% and a TIBC of 330. Retic was 1.6%. B12, folate, and TSH were normal.  Sed rate 29.   She has received weekly Venofer: 800 mg(10/21/2014 - 11/11/2014), 400 mg (03/03/2015 and 03/10/2015), 400 mg (10/20/2015 and 10/27/2015), 200 mg (04/16/2016), 600 mg (11/11/2016 - 11/25/2016), 600 mg (05/23/2017- 06/06/2017), 400 mg (02/06/2018 - 02/13/2018), and 200 mg (06/11/2018), 400 mg (09/27/2019 - 10/04/2019), and 600 mg (09/11/2020, 09/18/2020, and 10/02/2020).She receives Venofer if her ferritin is < 30.   Ferritinhas been followed: 14 on 12/28/2014, 9 on 02/23/2015, 113 on 03/17/2015, 12 on 10/13/2015, 31 on 01/10/2016, 19 on 04/11/2016, 23 on 07/18/2016, 9 on 11/11/2016, 9 on 05/13/2017, 46 on 08/07/2017, 41 on 11/10/2017, 32 on 02/03/2018, 132 on 05/11/2018, 77 on 06/11/2018, 29 on 09/20/2019, 50 on 11/08/2019, 39 on 12/20/2019, 45 on 03/21/2020, 30 on 06/21/2020 and 26 on 08/29/2020. Goal ferritinis 100.  She was admitted to [facility] 06/09/2018 -10/25/2019with a right lower extremity DVT.  Duplex revealed an occlusive thrombus in the proximal and mid right femoral vein with nonocclusive thrombus in the distal femoral vein and right popliteal vein.  She was discharged on Xarelto.   She received COVID vaccine on 09/17/2019.   Symptomatically, she feels "pretty good."   She stopped taking oral B12. She takes oral iron BID. She has shortness of breath on exertion.  She has not been taking any new medications or herbal products.  Exam is stable.  Plan: 1.   Labs today: CBC with diff, Cr, ferritin, iron studies 2.   Iron deficiency anemia  Hematocrit 36.3.  Hemoglobin 11.3.  MCV 88.3 today.   Ferritin 85 with an iron saturation 14 % and a TIBC 333.  She is on oral iron BID.             Ferritin goal 100.             She has received IV iron in the past (last 10/02/2020).             No IV  iron needed today.  Continue to monitor 3.   B12 deficiency  Patient has been off B12.  B12 level 2210 on 09/20/2019 and 748 on 11/08/2019.  B12 goal 400.  Reinstitute B12 3 days a week. 4.   RIGHTlower extremity DVT Patient continue Xarelto. She denies any bleeding. 5.   Stage III chronic kidney disease Creatinine 1.84. Consider Retacrit if hemoglobin < 10 and iron stores replete. 6.   Weight loss             Weight loss of 3 pounds.  Encourage caloric intake.    Continue to monitor. 7.   Mild leukopenia  WBC 3300 with an ANC of 1700.  Patient denies any new medications or herbal products.  Patient encourage to reinstitute oral B12.  Continue to monitor. 8.   RTC in 2 months for labs (CBC with diff, ferritin, iron studies, B12,  folate, TSH). 9.   RTC in 4 months for MD assessment and labs (CBC with diff, Cr, ferritin, iron studies).  I discussed the assessment and treatment plan with the patient.  The patient was provided an opportunity to ask questions and all were answered.  The patient agreed with the plan and demonstrated an understanding of the instructions.  The patient was advised to call back if the symptoms worsen or if the condition fails to improve as anticipated.  I provided 17 minutes of face-to-face time during this this encounter and > 50% was spent counseling as documented under my assessment and plan.  An additional 5 minutes were spent reviewing her chart (Epic and Care Everywhere) including notes, labs, and imaging studies.    Lequita Asal, MD, PhD    10/18/2020, 11:08 AM  I, Mirian Mo Tufford, am acting as a Education administrator for [employer]. Mike Gip, MD.   I, Florabel Faulks C. Mike Gip, MD, have reviewed the above documentation for accuracy and completeness, and I agree with the above.

## 2020-10-18 ENCOUNTER — Other Ambulatory Visit: Payer: Self-pay

## 2020-10-18 ENCOUNTER — Inpatient Hospital Stay: Payer: Medicare HMO

## 2020-10-18 ENCOUNTER — Inpatient Hospital Stay: Payer: Medicare HMO | Attending: Hematology and Oncology | Admitting: Hematology and Oncology

## 2020-10-18 ENCOUNTER — Encounter: Payer: Self-pay | Admitting: Hematology and Oncology

## 2020-10-18 VITALS — BP 147/94 | HR 69 | Resp 18 | Wt 240.3 lb

## 2020-10-18 DIAGNOSIS — Z7901 Long term (current) use of anticoagulants: Secondary | ICD-10-CM | POA: Diagnosis not present

## 2020-10-18 DIAGNOSIS — D509 Iron deficiency anemia, unspecified: Secondary | ICD-10-CM | POA: Diagnosis not present

## 2020-10-18 DIAGNOSIS — E538 Deficiency of other specified B group vitamins: Secondary | ICD-10-CM

## 2020-10-18 DIAGNOSIS — Z86718 Personal history of other venous thrombosis and embolism: Secondary | ICD-10-CM | POA: Diagnosis not present

## 2020-10-18 DIAGNOSIS — Z7951 Long term (current) use of inhaled steroids: Secondary | ICD-10-CM | POA: Diagnosis not present

## 2020-10-18 DIAGNOSIS — I252 Old myocardial infarction: Secondary | ICD-10-CM | POA: Insufficient documentation

## 2020-10-18 DIAGNOSIS — N1832 Chronic kidney disease, stage 3b: Secondary | ICD-10-CM | POA: Diagnosis not present

## 2020-10-18 DIAGNOSIS — D72819 Decreased white blood cell count, unspecified: Secondary | ICD-10-CM | POA: Insufficient documentation

## 2020-10-18 DIAGNOSIS — I10 Essential (primary) hypertension: Secondary | ICD-10-CM | POA: Diagnosis not present

## 2020-10-18 DIAGNOSIS — E039 Hypothyroidism, unspecified: Secondary | ICD-10-CM | POA: Diagnosis not present

## 2020-10-18 DIAGNOSIS — I82511 Chronic embolism and thrombosis of right femoral vein: Secondary | ICD-10-CM

## 2020-10-18 DIAGNOSIS — G4730 Sleep apnea, unspecified: Secondary | ICD-10-CM | POA: Diagnosis not present

## 2020-10-18 DIAGNOSIS — N183 Chronic kidney disease, stage 3 unspecified: Secondary | ICD-10-CM | POA: Insufficient documentation

## 2020-10-18 DIAGNOSIS — Z79899 Other long term (current) drug therapy: Secondary | ICD-10-CM | POA: Insufficient documentation

## 2020-10-18 LAB — CBC WITH DIFFERENTIAL/PLATELET
Abs Immature Granulocytes: 0.01 10*3/uL (ref 0.00–0.07)
Basophils Absolute: 0 10*3/uL (ref 0.0–0.1)
Basophils Relative: 1 %
Eosinophils Absolute: 0.1 10*3/uL (ref 0.0–0.5)
Eosinophils Relative: 2 %
HCT: 36.3 % (ref 36.0–46.0)
Hemoglobin: 11.3 g/dL — ABNORMAL LOW (ref 12.0–15.0)
Immature Granulocytes: 0 %
Lymphocytes Relative: 35 %
Lymphs Abs: 1.1 10*3/uL (ref 0.7–4.0)
MCH: 27.5 pg (ref 26.0–34.0)
MCHC: 31.1 g/dL (ref 30.0–36.0)
MCV: 88.3 fL (ref 80.0–100.0)
Monocytes Absolute: 0.4 10*3/uL (ref 0.1–1.0)
Monocytes Relative: 11 %
Neutro Abs: 1.7 10*3/uL (ref 1.7–7.7)
Neutrophils Relative %: 51 %
Platelets: 228 10*3/uL (ref 150–400)
RBC: 4.11 MIL/uL (ref 3.87–5.11)
RDW: 15.9 % — ABNORMAL HIGH (ref 11.5–15.5)
WBC: 3.3 10*3/uL — ABNORMAL LOW (ref 4.0–10.5)
nRBC: 0 % (ref 0.0–0.2)

## 2020-10-18 LAB — IRON AND TIBC
Iron: 48 ug/dL (ref 28–170)
Saturation Ratios: 14 % (ref 10.4–31.8)
TIBC: 333 ug/dL (ref 250–450)
UIBC: 285 ug/dL

## 2020-10-18 LAB — CREATININE, SERUM
Creatinine, Ser: 1.84 mg/dL — ABNORMAL HIGH (ref 0.44–1.00)
GFR, Estimated: 26 mL/min — ABNORMAL LOW (ref >60)

## 2020-10-18 LAB — FERRITIN: Ferritin: 85 ng/mL (ref 11–307)

## 2020-10-18 NOTE — Progress Notes (Signed)
SOB and dizziness on exertion

## 2020-10-27 DIAGNOSIS — F0151 Vascular dementia with behavioral disturbance: Secondary | ICD-10-CM | POA: Diagnosis not present

## 2020-10-27 DIAGNOSIS — M19012 Primary osteoarthritis, left shoulder: Secondary | ICD-10-CM | POA: Diagnosis not present

## 2020-10-27 DIAGNOSIS — F22 Delusional disorders: Secondary | ICD-10-CM | POA: Diagnosis not present

## 2020-10-27 DIAGNOSIS — I5032 Chronic diastolic (congestive) heart failure: Secondary | ICD-10-CM | POA: Diagnosis not present

## 2020-10-27 DIAGNOSIS — D649 Anemia, unspecified: Secondary | ICD-10-CM | POA: Diagnosis not present

## 2020-10-27 DIAGNOSIS — I10 Essential (primary) hypertension: Secondary | ICD-10-CM | POA: Diagnosis not present

## 2020-10-27 DIAGNOSIS — N184 Chronic kidney disease, stage 4 (severe): Secondary | ICD-10-CM | POA: Diagnosis not present

## 2020-10-27 DIAGNOSIS — R7309 Other abnormal glucose: Secondary | ICD-10-CM | POA: Diagnosis not present

## 2020-10-27 DIAGNOSIS — M19011 Primary osteoarthritis, right shoulder: Secondary | ICD-10-CM | POA: Diagnosis not present

## 2020-10-27 DIAGNOSIS — I714 Abdominal aortic aneurysm, without rupture: Secondary | ICD-10-CM | POA: Diagnosis not present

## 2020-11-03 DIAGNOSIS — I701 Atherosclerosis of renal artery: Secondary | ICD-10-CM | POA: Diagnosis not present

## 2020-11-03 DIAGNOSIS — I5032 Chronic diastolic (congestive) heart failure: Secondary | ICD-10-CM | POA: Diagnosis not present

## 2020-11-03 DIAGNOSIS — F0390 Unspecified dementia without behavioral disturbance: Secondary | ICD-10-CM | POA: Diagnosis not present

## 2020-11-03 DIAGNOSIS — N1832 Chronic kidney disease, stage 3b: Secondary | ICD-10-CM | POA: Diagnosis not present

## 2020-11-03 DIAGNOSIS — I480 Paroxysmal atrial fibrillation: Secondary | ICD-10-CM | POA: Diagnosis not present

## 2020-11-03 DIAGNOSIS — I130 Hypertensive heart and chronic kidney disease with heart failure and stage 1 through stage 4 chronic kidney disease, or unspecified chronic kidney disease: Secondary | ICD-10-CM | POA: Diagnosis not present

## 2020-11-03 DIAGNOSIS — R441 Visual hallucinations: Secondary | ICD-10-CM | POA: Diagnosis not present

## 2020-11-03 DIAGNOSIS — D649 Anemia, unspecified: Secondary | ICD-10-CM | POA: Diagnosis not present

## 2020-11-03 DIAGNOSIS — Z0000 Encounter for general adult medical examination without abnormal findings: Secondary | ICD-10-CM | POA: Diagnosis not present

## 2020-11-03 DIAGNOSIS — I714 Abdominal aortic aneurysm, without rupture: Secondary | ICD-10-CM | POA: Diagnosis not present

## 2020-11-22 ENCOUNTER — Other Ambulatory Visit (INDEPENDENT_AMBULATORY_CARE_PROVIDER_SITE_OTHER): Payer: Self-pay | Admitting: Nurse Practitioner

## 2020-11-22 DIAGNOSIS — I714 Abdominal aortic aneurysm, without rupture, unspecified: Secondary | ICD-10-CM

## 2020-11-23 ENCOUNTER — Encounter (INDEPENDENT_AMBULATORY_CARE_PROVIDER_SITE_OTHER): Payer: Self-pay | Admitting: Nurse Practitioner

## 2020-11-23 ENCOUNTER — Ambulatory Visit (INDEPENDENT_AMBULATORY_CARE_PROVIDER_SITE_OTHER): Payer: Medicare HMO

## 2020-11-23 ENCOUNTER — Other Ambulatory Visit: Payer: Self-pay

## 2020-11-23 ENCOUNTER — Ambulatory Visit (INDEPENDENT_AMBULATORY_CARE_PROVIDER_SITE_OTHER): Payer: Medicare HMO | Admitting: Nurse Practitioner

## 2020-11-23 VITALS — BP 115/74 | HR 76 | Ht 72.0 in | Wt 246.0 lb

## 2020-11-23 DIAGNOSIS — I714 Abdominal aortic aneurysm, without rupture, unspecified: Secondary | ICD-10-CM

## 2020-11-23 DIAGNOSIS — I701 Atherosclerosis of renal artery: Secondary | ICD-10-CM

## 2020-11-23 DIAGNOSIS — E782 Mixed hyperlipidemia: Secondary | ICD-10-CM

## 2020-11-23 DIAGNOSIS — I10 Essential (primary) hypertension: Secondary | ICD-10-CM

## 2020-11-23 NOTE — Progress Notes (Signed)
Subjective:    Patient ID: Nicole Bishop, female    DOB: 1931-03-18, 87 y.o.   MRN: [id number] Chief Complaint  Patient presents with  . Follow-up    6 Mo AAA      The patient returns to the office for surveillance of a known abdominal aortic aneurysm. Patient denies abdominal pain or back pain, no other abdominal complaints. No changes suggesting embolic episodes.   There have been no interval changes in the patient's overall health care since his last visit.  Patient denies amaurosis fugax or TIA symptoms. There is no history of claudication or rest pain symptoms of the lower extremities. The patient denies angina or shortness of breath.   The patient has good blood pressure control at this time as well.  Duplex US of the aorta and iliac arteries shows an AAA measured 4.3 cm at the midportion.  Previous studies 05/25/2020 show measurement 4.5 cm.  There is evidence of mild abnormal dilatation of the right common and the left common iliac arteries.  Previous CT scan place the abdominal aortic measurement at 5.1 cm.    Review of Systems  Gastrointestinal: Negative for abdominal pain.  Neurological: Positive for weakness.  Psychiatric/Behavioral: Positive for confusion.  All other systems reviewed and are negative.      Objective:   Physical Exam Vitals reviewed.  HENT:     Head: Normocephalic.  Cardiovascular:     Rate and Rhythm: Normal rate.     Pulses: Normal pulses.  Pulmonary:     Effort: Pulmonary effort is normal.  Neurological:     Mental Status: She is alert and oriented to person, place, and time.     Motor: Weakness present.     Gait: Gait abnormal.  Psychiatric:        Mood and Affect: Mood normal.        Behavior: Behavior normal.        Thought Content: Thought content normal.        Cognition and Memory: Cognition is impaired. Memory is impaired.        Judgment: Judgment normal.     BP 115/74   Pulse 76   Ht 6' (1.829 m)   Wt 246 lb  (111.6 kg)   BMI 33.36 kg/m   Past Medical History:  Diagnosis Date  . Anemia   . Anxiety   . Arthritis   . Chronic kidney disease   . Coronary artery disease   . Depression   . Dizziness   . Dyspnea   . Dysrhythmia    gets tachy if anxious or excited  . GERD (gastroesophageal reflux disease)   . History of hiatal hernia   . HOH (hard of hearing)   . Hypertension   . Hypothyroidism   . IDA (iron deficiency anemia) 12/14/2014  . Myocardial infarction ([place]) 1992  . Renal insufficiency   . Sleep apnea   . Tremors of nervous system     Social History   Socioeconomic History  . Marital status: Widowed    Spouse name: Not on file  . Number of children: 77  . Years of education: Not on file  . Highest education level: 11th grade  Occupational History    Comment: retired  Tobacco Use  . Smoking status: Never Smoker  . Smokeless tobacco: Never Used  Vaping Use  . Vaping Use: Never used  Substance and Sexual Activity  . Alcohol use: No  . Drug use: No  . Sexual  activity: Never  Other Topics Concern  . Not on file  Social History Narrative  . Not on file   Social Determinants of Health   Financial Resource Strain: Not on file  Food Insecurity: Not on file  Transportation Needs: Not on file  Physical Activity: Not on file  Stress: Not on file  Social Connections: Not on file  Intimate Partner Violence: Not on file    Past Surgical History:  Procedure Laterality Date  . CATARACT EXTRACTION W/PHACO Left 07/15/2017   Procedure: CATARACT EXTRACTION PHACO AND INTRAOCULAR LENS PLACEMENT (IOC)-LEFT;  Surgeon: Birder Robson, MD;  Location: [facility] ORS;  Service: Ophthalmology;  Laterality: Left;  [place] 00.36.1 AP% 13.9 CDE 5.02 Fluid Pack lot # [id number]  . CATARACT EXTRACTION W/PHACO Right 08/26/2017   Procedure: CATARACT EXTRACTION PHACO AND INTRAOCULAR LENS PLACEMENT (IOC);  Surgeon: Birder Robson, MD;  Location: [facility] ORS;  Service: Ophthalmology;  Laterality:  Right;  [place] 00:41.0 AP% 15.3 CDE 6.27 FLUID PACK LOT # [id number]  . CHOLECYSTECTOMY    . COLONOSCOPY WITH PROPOFOL N/A 04/10/2015   Procedure: COLONOSCOPY WITH PROPOFOL;  Surgeon: Manya Silvas, MD;  Location[facility];  Service: Endoscopy;  Laterality: N/A;  . ESOPHAGOGASTRODUODENOSCOPY (EGD) WITH PROPOFOL N/A 04/10/2015   Procedure: ESOPHAGOGASTRODUODENOSCOPY (EGD) WITH PROPOFOL;  Surgeon: Manya Silvas, MD;  Location: [facility] ENDOSCOPY;  Service: Endoscopy;  Laterality: N/A;  . fatty tumor removed on left shoulder    . gallbladdedr removed    . JOINT REPLACEMENT Bilateral    total knee replacements  . REPLACEMENT TOTAL KNEE BILATERAL Bilateral     Family History  Problem Relation Age of Onset  . Arthritis Mother   . Hypertension Mother   . Stroke Mother   . Hypertension Father   . Heart attack Father   . Breast cancer Neg Hx     Allergies  Allergen Reactions  . Penicillins Rash and Other (See Comments)    Has patient had a PCN reaction causing immediate rash, facial/tongue/throat swelling, SOB or lightheadedness with hypotension: Unknown Has patient had a PCN reaction causing severe rash involving mucus membranes or skin necrosis: Unknown Has patient had a PCN reaction that required hospitalization: Unknown Has patient had a PCN reaction occurring within the last 10 years: Unknown If all of the above answers are "NO", then may proceed with Cephalosporin use.   . Nsaids Other (See Comments)    GI upset  . Rosuvastatin Other (See Comments)  . Atorvastatin Rash  . Celebrex [Celecoxib] Other (See Comments)    Constipation and stomach upset  . Felodipine Nausea Only  . Oxaprozin Nausea Only  . Rofecoxib Other (See Comments)    GI upset  . Simvastatin Rash    CBC Latest Ref Rng & Units 10/18/2020 08/29/2020 06/21/2020  WBC 4.0 - 10.5 K/uL 3.3(L) 3.8(L) 3.8(L)  Hemoglobin 12.0 - 15.0 g/dL 11.3(L) 10.0(L) 9.7(L)  Hematocrit 36.0 - 46.0 % 36.3 32.5(L) 31.1(L)  Platelets  150 - 400 K/uL 228 205 210      CMP     Component Value Date/Time   NA 140 06/21/2020 1111   NA 135 11/21/2014 1301   K 4.1 06/21/2020 1111   K 4.1 11/21/2014 1301   CL 105 06/21/2020 1111   CL 105 11/21/2014 1301   CO2 24 06/21/2020 1111   CO2 23 11/21/2014 1301   GLUCOSE 99 06/21/2020 1111   GLUCOSE 102 (H) 11/21/2014 1301   BUN 31 (H) 06/21/2020 1111   BUN 15 11/21/2014 1301  CREATININE 1.84 (H) 10/18/2020 1017   CREATININE 1.08 (H) 11/21/2014 1301   CALCIUM 9.3 06/21/2020 1111   CALCIUM 9.0 11/21/2014 1301   PROT 7.0 02/28/2020 1425   PROT 7.1 11/21/2014 1301   ALBUMIN 3.7 02/28/2020 1425   ALBUMIN 3.9 11/21/2014 1301   AST 14 (L) 02/28/2020 1425   AST 18 11/21/2014 1301   ALT 12 02/28/2020 1425   ALT 13 (L) 11/21/2014 1301   ALKPHOS 57 02/28/2020 1425   ALKPHOS 74 11/21/2014 1301   BILITOT 0.7 02/28/2020 1425   BILITOT 0.6 11/21/2014 1301   GFRNONAA 26 (L) 10/18/2020 1017   GFRNONAA 48 (L) 11/21/2014 1301   GFRAA 29 (L) 02/28/2020 1425   GFRAA 56 (L) 11/21/2014 1301     No results found.     Assessment & Plan:   1. Abdominal aortic aneurysm (AAA) without rupture (HCC) Given the patient's severe dementia in addition to her renal dysfunction and other cardiac issues, we will treat this very conservatively.  We will not recommend treatment until the patient's aneurysm is at least 5.5 cm.  Discussed with family that at 5.5 cm the risk of rupture is approximately 15 %/year.  Due to the patient's renal artery stenosis intervention will likely be extended which may also place the patient at risk for possible renal failure.  Due to all these issues will continue with the conservative management of close follow-up.  I discussed with patient the emergent signs symptoms such as distal embolization or feeling of abdominal pain with back pain.  We will have the patient return in 6 months with noninvasive studies.  2. Mixed hyperlipidemia Continue statin as ordered and  reviewed, no changes at this time   3. Right renal artery stenosis (HCC) Currently the patient's blood pressure is under good control.  We will continue with conservative therapy as planned below.  4. Essential (primary) hypertension Continue antihypertensive medications as already ordered, these medications have been reviewed and there are no changes at this time.    Current Outpatient Medications on File Prior to Visit  Medication Sig Dispense Refill  . acetaminophen (TYLENOL) 650 MG CR tablet Take 650 mg by mouth every 8 (eight) hours as needed for pain.     Marland Kitchen amLODipine (NORVASC) 5 MG tablet Take 5 mg by mouth daily.    . benazepril (LOTENSIN) 5 MG tablet Take 5 mg by mouth daily.    . calcitRIOL (ROCALTROL) 0.25 MCG capsule Take 1 capsule by mouth daily.    . Cholecalciferol (VITAMIN D3) 2000 units capsule Take 4,000 Units by mouth daily.    . cromolyn (OPTICROM) 4 % ophthalmic solution SMARTSIG:In Eye(s)    . docusate sodium (COLACE) 100 MG capsule Take 100 mg by mouth 2 (two) times daily.    Marland Kitchen esomeprazole (NEXIUM) 40 MG capsule Take 40 mg by mouth daily as needed (acid reflux).    . Ferrous Sulfate 142 (45 Fe) MG TBCR Take 1 tablet by mouth 2 (two) times daily.     . hydrALAZINE (APRESOLINE) 25 MG tablet     . lactulose (CHRONULAC) 10 GM/15ML solution Take 10 g by mouth daily as needed for mild constipation.     Marland Kitchen loratadine (CLARITIN) 10 MG tablet Take 1 tablet (10 mg total) by mouth daily. 30 tablet 0  . metoprolol succinate (TOPROL-XL) 50 MG 24 hr tablet Take 1 tablet (50 mg total) by mouth daily. Take with or immediately following a meal. (Patient taking differently: Take 25 mg by mouth daily.  Take with or immediately following a meal.) 30 tablet 0  . mometasone (NASONEX) 50 MCG/ACT nasal spray Place 2 sprays into the nose daily as needed (allergies).    Marland Kitchen olopatadine (PATANOL) 0.1 % ophthalmic solution Place 1 drop into both eyes 2 (two) times daily as needed for allergies.      . polyethylene glycol (MIRALAX / GLYCOLAX) packet Take 17 g by mouth daily as needed for mild constipation or moderate constipation.    . risperiDONE (RISPERDAL) 0.25 MG tablet Take 1 tablet by mouth daily.    . rivaroxaban (XARELTO) 20 MG TABS tablet Take 1 tablet (20 mg total) by mouth daily with supper. 30 tablet 0  . senna-docusate (SENOKOT-S) 8.6-50 MG tablet Take 1 tablet by mouth 2 (two) times daily.    Marland Kitchen olmesartan (BENICAR) 20 MG tablet Take 20 mg by mouth daily.      Current Facility-Administered Medications on File Prior to Visit  Medication Dose Route Frequency Provider Last Rate Last Admin  . 0.9 %  sodium chloride infusion   Intravenous Continuous Lequita Asal, MD   Stopped at 02/06/18 1429    There are no Patient Instructions on file for this visit. No follow-ups on file.   Kris Hartmann, NP

## 2020-11-30 DIAGNOSIS — I5032 Chronic diastolic (congestive) heart failure: Secondary | ICD-10-CM | POA: Diagnosis not present

## 2020-11-30 DIAGNOSIS — N1832 Chronic kidney disease, stage 3b: Secondary | ICD-10-CM | POA: Diagnosis not present

## 2020-11-30 DIAGNOSIS — F0151 Vascular dementia with behavioral disturbance: Secondary | ICD-10-CM | POA: Diagnosis not present

## 2020-11-30 DIAGNOSIS — I890 Lymphedema, not elsewhere classified: Secondary | ICD-10-CM | POA: Diagnosis not present

## 2020-11-30 DIAGNOSIS — R0602 Shortness of breath: Secondary | ICD-10-CM | POA: Diagnosis not present

## 2020-11-30 DIAGNOSIS — I10 Essential (primary) hypertension: Secondary | ICD-10-CM | POA: Diagnosis not present

## 2020-11-30 DIAGNOSIS — I714 Abdominal aortic aneurysm, without rupture: Secondary | ICD-10-CM | POA: Diagnosis not present

## 2020-11-30 DIAGNOSIS — I700 Atherosclerosis of aorta: Secondary | ICD-10-CM | POA: Diagnosis not present

## 2020-11-30 DIAGNOSIS — E782 Mixed hyperlipidemia: Secondary | ICD-10-CM | POA: Diagnosis not present

## 2020-12-15 ENCOUNTER — Other Ambulatory Visit: Payer: Self-pay

## 2020-12-15 DIAGNOSIS — D509 Iron deficiency anemia, unspecified: Secondary | ICD-10-CM

## 2020-12-15 DIAGNOSIS — E538 Deficiency of other specified B group vitamins: Secondary | ICD-10-CM

## 2020-12-15 DIAGNOSIS — N1832 Chronic kidney disease, stage 3b: Secondary | ICD-10-CM

## 2020-12-19 ENCOUNTER — Other Ambulatory Visit: Payer: Self-pay

## 2020-12-19 ENCOUNTER — Inpatient Hospital Stay: Payer: Medicare HMO | Attending: Nurse Practitioner | Admitting: Oncology

## 2020-12-19 DIAGNOSIS — R634 Abnormal weight loss: Secondary | ICD-10-CM | POA: Insufficient documentation

## 2020-12-19 DIAGNOSIS — D509 Iron deficiency anemia, unspecified: Secondary | ICD-10-CM | POA: Insufficient documentation

## 2020-12-19 DIAGNOSIS — Z7901 Long term (current) use of anticoagulants: Secondary | ICD-10-CM | POA: Insufficient documentation

## 2020-12-19 DIAGNOSIS — I252 Old myocardial infarction: Secondary | ICD-10-CM | POA: Insufficient documentation

## 2020-12-19 DIAGNOSIS — Z86718 Personal history of other venous thrombosis and embolism: Secondary | ICD-10-CM | POA: Diagnosis not present

## 2020-12-19 DIAGNOSIS — R0602 Shortness of breath: Secondary | ICD-10-CM | POA: Diagnosis not present

## 2020-12-19 DIAGNOSIS — Z7951 Long term (current) use of inhaled steroids: Secondary | ICD-10-CM | POA: Insufficient documentation

## 2020-12-19 DIAGNOSIS — E538 Deficiency of other specified B group vitamins: Secondary | ICD-10-CM | POA: Diagnosis not present

## 2020-12-19 DIAGNOSIS — G4730 Sleep apnea, unspecified: Secondary | ICD-10-CM | POA: Diagnosis not present

## 2020-12-19 DIAGNOSIS — Z79899 Other long term (current) drug therapy: Secondary | ICD-10-CM | POA: Insufficient documentation

## 2020-12-19 DIAGNOSIS — I10 Essential (primary) hypertension: Secondary | ICD-10-CM | POA: Diagnosis not present

## 2020-12-19 DIAGNOSIS — N1832 Chronic kidney disease, stage 3b: Secondary | ICD-10-CM

## 2020-12-19 DIAGNOSIS — N183 Chronic kidney disease, stage 3 unspecified: Secondary | ICD-10-CM | POA: Insufficient documentation

## 2020-12-19 DIAGNOSIS — D72819 Decreased white blood cell count, unspecified: Secondary | ICD-10-CM | POA: Diagnosis not present

## 2020-12-19 LAB — CBC WITH DIFFERENTIAL/PLATELET
Abs Immature Granulocytes: 0.02 10*3/uL (ref 0.00–0.07)
Basophils Absolute: 0 10*3/uL (ref 0.0–0.1)
Basophils Relative: 1 %
Eosinophils Absolute: 0 10*3/uL (ref 0.0–0.5)
Eosinophils Relative: 1 %
HCT: 35.5 % — ABNORMAL LOW (ref 36.0–46.0)
Hemoglobin: 11 g/dL — ABNORMAL LOW (ref 12.0–15.0)
Immature Granulocytes: 1 %
Lymphocytes Relative: 31 %
Lymphs Abs: 1.3 10*3/uL (ref 0.7–4.0)
MCH: 27.2 pg (ref 26.0–34.0)
MCHC: 31 g/dL (ref 30.0–36.0)
MCV: 87.7 fL (ref 80.0–100.0)
Monocytes Absolute: 0.5 10*3/uL (ref 0.1–1.0)
Monocytes Relative: 11 %
Neutro Abs: 2.4 10*3/uL (ref 1.7–7.7)
Neutrophils Relative %: 55 %
Platelets: 207 10*3/uL (ref 150–400)
RBC: 4.05 MIL/uL (ref 3.87–5.11)
RDW: 15.5 % (ref 11.5–15.5)
WBC: 4.2 10*3/uL (ref 4.0–10.5)
nRBC: 0 % (ref 0.0–0.2)

## 2020-12-19 LAB — IRON AND TIBC
Iron: 108 ug/dL (ref 28–170)
Saturation Ratios: 27 % (ref 10.4–31.8)
TIBC: 396 ug/dL (ref 250–450)
UIBC: 288 ug/dL

## 2020-12-19 LAB — VITAMIN B12: Vitamin B-12: 1318 pg/mL — ABNORMAL HIGH (ref 180–914)

## 2020-12-19 LAB — FOLATE: Folate: 13.5 ng/mL (ref >5.9)

## 2020-12-19 LAB — TSH: TSH: 2.457 u[IU]/mL (ref 0.350–4.500)

## 2020-12-19 LAB — FERRITIN: Ferritin: 19 ng/mL (ref 11–307)

## 2020-12-21 ENCOUNTER — Telehealth: Payer: Self-pay

## 2020-12-21 NOTE — Progress Notes (Signed)
TY

## 2020-12-21 NOTE — Progress Notes (Signed)
She probably needs more iron. I would recommend 2 additional doses over the next couple weeks.   I will add abigail to see if we can get her scheduled.   I don't know if you want to call her and let her know her iron levels are still low and dropping. Is she bleeding from anywhere? How does she feel.   I am also happy to see her next week if she is feeling poorly.   Faythe Casa, NP 12/21/2020 1:25 PM

## 2020-12-21 NOTE — Telephone Encounter (Signed)
Nicole Bishop recommends pt to have an additional two rounds of iron. Per pts daughter pt is not currently bleeding from anywhere but feeling very drained and tired. Daughter also believes pt will need more iron. Informed schedulers of appt and they will reach back out to daughter with the appt details. Pts daughter understood advice.

## 2020-12-27 ENCOUNTER — Other Ambulatory Visit: Payer: Self-pay

## 2020-12-27 ENCOUNTER — Inpatient Hospital Stay (HOSPITAL_BASED_OUTPATIENT_CLINIC_OR_DEPARTMENT_OTHER): Payer: Medicare HMO | Admitting: Oncology

## 2020-12-27 ENCOUNTER — Inpatient Hospital Stay: Payer: Medicare HMO

## 2020-12-27 VITALS — BP 155/98 | HR 71 | Temp 97.2°F | Resp 20 | Wt 253.4 lb

## 2020-12-27 VITALS — BP 159/88 | HR 78

## 2020-12-27 DIAGNOSIS — N1831 Chronic kidney disease, stage 3a: Secondary | ICD-10-CM

## 2020-12-27 DIAGNOSIS — N183 Chronic kidney disease, stage 3 unspecified: Secondary | ICD-10-CM | POA: Diagnosis not present

## 2020-12-27 DIAGNOSIS — Z7901 Long term (current) use of anticoagulants: Secondary | ICD-10-CM | POA: Diagnosis not present

## 2020-12-27 DIAGNOSIS — R634 Abnormal weight loss: Secondary | ICD-10-CM | POA: Diagnosis not present

## 2020-12-27 DIAGNOSIS — E538 Deficiency of other specified B group vitamins: Secondary | ICD-10-CM

## 2020-12-27 DIAGNOSIS — Z86718 Personal history of other venous thrombosis and embolism: Secondary | ICD-10-CM | POA: Diagnosis not present

## 2020-12-27 DIAGNOSIS — I10 Essential (primary) hypertension: Secondary | ICD-10-CM | POA: Diagnosis not present

## 2020-12-27 DIAGNOSIS — D72819 Decreased white blood cell count, unspecified: Secondary | ICD-10-CM | POA: Diagnosis not present

## 2020-12-27 DIAGNOSIS — D631 Anemia in chronic kidney disease: Secondary | ICD-10-CM | POA: Diagnosis not present

## 2020-12-27 DIAGNOSIS — D509 Iron deficiency anemia, unspecified: Secondary | ICD-10-CM | POA: Diagnosis not present

## 2020-12-27 DIAGNOSIS — G4730 Sleep apnea, unspecified: Secondary | ICD-10-CM | POA: Diagnosis not present

## 2020-12-27 MED ORDER — SODIUM CHLORIDE 0.9 % IV SOLN
Freq: Once | INTRAVENOUS | Status: AC
  Filled 2020-12-27: qty 250

## 2020-12-27 MED ORDER — IRON SUCROSE 20 MG/ML IV SOLN
200.0000 mg | Freq: Once | INTRAVENOUS | Status: AC
Start: 2020-12-27 — End: 2020-12-27
  Administered 2020-12-27: 200 mg via INTRAVENOUS
  Filled 2020-12-27: qty 10

## 2020-12-27 NOTE — Progress Notes (Signed)
[facility]  [address] [place], [place] [address] Phone: [phone]  Fax: [phone]   Clinic Day:  12/27/2020  Referring physician: Tracie Harrier, MD  Chief Complaint: Nicole Bishop is a 87 y.o. female with stage III chronic kidney disease and anemia who is seen for 2 month assessment.   HPI: The patient was last seen in the hematology clinic on 10/18/2020. At this time, she was doing  "pretty good."     The patient saw Dr. Holley Raring on 08/29/2020. Her chronic kidney disease had been fluctuating. She was to continue Benicar 20 mg daily. Follow-up was planned for 4 months.  Screening bilateral mammogram on 10/11/2020 revealed no evidence of malignancy.  She received Venofer on 09/11/2020, 09/18/2020, and 10/02/2020.  During the interim, she reports she gets tired easily, "even just sitting folding clothes." She additionally reports "I just feel weak all around."   She had a mammogram completed on 10/15/2020 which showed no evidence of malignancy and recommended a follow-up in 1 year.  Patient is a poor historian.  Much of her history is performed by her daughter.  Past Medical History:  Diagnosis Date  . Anemia   . Anxiety   . Arthritis   . Chronic kidney disease   . Coronary artery disease   . Depression   . Dizziness   . Dyspnea   . Dysrhythmia    gets tachy if anxious or excited  . GERD (gastroesophageal reflux disease)   . History of hiatal hernia   . HOH (hard of hearing)   . Hypertension   . Hypothyroidism   . IDA (iron deficiency anemia) 12/14/2014  . Myocardial infarction ([place]) 1992  . Renal insufficiency   . Sleep apnea   . Tremors of nervous system     Past Surgical History:  Procedure Laterality Date  . CATARACT EXTRACTION W/PHACO Left 07/15/2017   Procedure: CATARACT EXTRACTION PHACO AND INTRAOCULAR LENS PLACEMENT (IOC)-LEFT;  Surgeon: Birder Robson, MD;  Location: [facility] ORS;  Service: Ophthalmology;   Laterality: Left;  [place] 00.36.1 AP% 13.9 CDE 5.02 Fluid Pack lot # [id number]  . CATARACT EXTRACTION W/PHACO Right 08/26/2017   Procedure: CATARACT EXTRACTION PHACO AND INTRAOCULAR LENS PLACEMENT (IOC);  Surgeon: Birder Robson, MD;  Location: [facility] ORS;  Service: Ophthalmology;  Laterality: Right;  [place] 00:41.0 AP% 15.3 CDE 6.27 FLUID PACK LOT # [id number]  . CHOLECYSTECTOMY    . COLONOSCOPY WITH PROPOFOL N/A 04/10/2015   Procedure: COLONOSCOPY WITH PROPOFOL;  Surgeon: Manya Silvas, MD;  Location: [facility] ENDOSCOPY;  Service: Endoscopy;  Laterality: N/A;  . ESOPHAGOGASTRODUODENOSCOPY (EGD) WITH PROPOFOL N/A 04/10/2015   Procedure: ESOPHAGOGASTRODUODENOSCOPY (EGD) WITH PROPOFOL;  Surgeon: Manya Silvas, MD;  Location: [facility] ENDOSCOPY;  Service: Endoscopy;  Laterality: N/A;  . fatty tumor removed on left shoulder    . gallbladdedr removed    . JOINT REPLACEMENT Bilateral    total knee replacements  . REPLACEMENT TOTAL KNEE BILATERAL Bilateral     Family History  Problem Relation Age of Onset  . Arthritis Mother   . Hypertension Mother   . Stroke Mother   . Hypertension Father   . Heart attack Father   . Breast cancer Neg Hx     Social History:  reports that she has never smoked. She has never used smokeless tobacco. She reports that she does not drink alcohol and does not use drugs. She lives at home alone. The patient is accompanied by her daughter today.   Allergies:  Allergies  Allergen Reactions  . Penicillins Rash and Other (See Comments)    Has patient had a PCN reaction causing immediate rash, facial/tongue/throat swelling, SOB or lightheadedness with hypotension: Unknown Has patient had a PCN reaction causing severe rash involving mucus membranes or skin necrosis: Unknown Has patient had a PCN reaction that required hospitalization: Unknown Has patient had a PCN reaction occurring within the last 10 years: Unknown If all of the above answers are "NO", then may proceed with  Cephalosporin use.   . Nsaids Other (See Comments)    GI upset  . Rosuvastatin Other (See Comments)  . Atorvastatin Rash  . Celebrex [Celecoxib] Other (See Comments)    Constipation and stomach upset  . Felodipine Nausea Only  . Oxaprozin Nausea Only  . Rofecoxib Other (See Comments)    GI upset  . Simvastatin Rash    Current Medications: Current Outpatient Medications  Medication Sig Dispense Refill  . acetaminophen (TYLENOL) 650 MG CR tablet Take 650 mg by mouth every 8 (eight) hours as needed for pain.     Marland Kitchen amLODipine (NORVASC) 5 MG tablet Take 5 mg by mouth daily.    . benazepril (LOTENSIN) 5 MG tablet Take 5 mg by mouth daily.    . calcitRIOL (ROCALTROL) 0.25 MCG capsule Take 1 capsule by mouth daily.    . Cholecalciferol (VITAMIN D3) 2000 units capsule Take 4,000 Units by mouth daily.    . cromolyn (OPTICROM) 4 % ophthalmic solution SMARTSIG:In Eye(s)    . docusate sodium (COLACE) 100 MG capsule Take 100 mg by mouth 2 (two) times daily.    Marland Kitchen esomeprazole (NEXIUM) 40 MG capsule Take 40 mg by mouth daily as needed (acid reflux).    . Ferrous Sulfate 142 (45 Fe) MG TBCR Take 1 tablet by mouth 2 (two) times daily.     . hydrALAZINE (APRESOLINE) 25 MG tablet     . lactulose (CHRONULAC) 10 GM/15ML solution Take 10 g by mouth daily as needed for mild constipation.     Marland Kitchen loratadine (CLARITIN) 10 MG tablet Take 1 tablet (10 mg total) by mouth daily. 30 tablet 0  . metoprolol succinate (TOPROL-XL) 50 MG 24 hr tablet Take 1 tablet (50 mg total) by mouth daily. Take with or immediately following a meal. (Patient taking differently: Take 25 mg by mouth daily. Take with or immediately following a meal.) 30 tablet 0  . mometasone (NASONEX) 50 MCG/ACT nasal spray Place 2 sprays into the nose daily as needed (allergies).    . olmesartan (BENICAR) 20 MG tablet Take 20 mg by mouth daily.     Marland Kitchen olopatadine (PATANOL) 0.1 % ophthalmic solution Place 1 drop into both eyes 2 (two) times daily as  needed for allergies.     . polyethylene glycol (MIRALAX / GLYCOLAX) packet Take 17 g by mouth daily as needed for mild constipation or moderate constipation.    . risperiDONE (RISPERDAL) 0.25 MG tablet Take 1 tablet by mouth daily.    . rivaroxaban (XARELTO) 20 MG TABS tablet Take 1 tablet (20 mg total) by mouth daily with supper. 30 tablet 0  . senna-docusate (SENOKOT-S) 8.6-50 MG tablet Take 1 tablet by mouth 2 (two) times daily.     No current facility-administered medications for this visit.   Facility-Administered Medications Ordered in Other Visits  Medication Dose Route Frequency Provider Last Rate Last Admin  . 0.9 %  sodium chloride infusion   Intravenous Continuous Lequita Asal, MD   Stopped at 02/06/18  1429   Review of Systems  Constitutional: Positive for malaise/fatigue. Negative for fever and weight loss.  HENT: Negative for congestion and hearing loss.   Eyes: Negative for blurred vision and double vision.  Respiratory: Positive for shortness of breath. Negative for cough.        SOB on exertion  Cardiovascular: Negative for chest pain and palpitations.  Gastrointestinal: Negative for abdominal pain, constipation, diarrhea, nausea and vomiting.  Genitourinary: Negative for frequency and urgency.  Skin: Negative for rash.  Neurological: Negative for dizziness, tingling and headaches.  Endo/Heme/Allergies: Does not bruise/bleed easily.  Psychiatric/Behavioral: Positive for memory loss. Negative for depression. The patient is not nervous/anxious and does not have insomnia.        "I have trouble with my memory more than I would like"     Performance status (ECOG): 2  Vital Signs Blood pressure (!) 155/98, pulse 71, temperature (!) 97.2 F (36.2 C), resp. rate 20, weight 114.9 kg, SpO2 96 %.   Physical Exam Vitals (Patient sitting in wheelchair) and nursing note reviewed.  HENT:     Head: Normocephalic and atraumatic.     Nose: No congestion.      Mouth/Throat:     Mouth: Mucous membranes are moist.     Pharynx: Oropharynx is clear.  Eyes:     General:        Right eye: No discharge.        Left eye: No discharge.     Extraocular Movements: Extraocular movements intact.     Pupils: Pupils are equal, round, and reactive to light.  Cardiovascular:     Rate and Rhythm: Normal rate and regular rhythm.     Pulses: Normal pulses.     Heart sounds: No murmur heard.   Pulmonary:     Effort: Pulmonary effort is normal.  Abdominal:     General: Bowel sounds are normal. There is no distension.     Tenderness: There is no abdominal tenderness. There is no guarding.  Musculoskeletal:        General: No swelling or deformity.     Right lower leg: 4+ Edema present.     Left lower leg: 4+ Edema present.  Skin:    General: Skin is warm and dry.     Capillary Refill: Capillary refill takes 2 to 3 seconds.  Neurological:     Mental Status: She is alert and oriented to person, place, and time.  Psychiatric:        Mood and Affect: Mood normal.        Thought Content: Thought content normal.       No visits with results within 3 Day(s) from this visit.  Latest known visit with results is:  Clinical Support on 12/19/2020  Component Date Value Ref Range Status  . WBC 12/19/2020 4.2  4.0 - 10.5 K/uL Final  . RBC 12/19/2020 4.05  3.87 - 5.11 MIL/uL Final  . Hemoglobin 12/19/2020 11.0* 12.0 - 15.0 g/dL Final  . HCT 12/19/2020 35.5* 36.0 - 46.0 % Final  . MCV 12/19/2020 87.7  80.0 - 100.0 fL Final  . MCH 12/19/2020 27.2  26.0 - 34.0 pg Final  . MCHC 12/19/2020 31.0  30.0 - 36.0 g/dL Final  . RDW 12/19/2020 15.5  11.5 - 15.5 % Final  . Platelets 12/19/2020 207  150 - 400 K/uL Final  . nRBC 12/19/2020 0.0  0.0 - 0.2 % Final  . Neutrophils Relative % 12/19/2020 55  % Final  .  Neutro Abs 12/19/2020 2.4  1.7 - 7.7 K/uL Final  . Lymphocytes Relative 12/19/2020 31  % Final  . Lymphs Abs 12/19/2020 1.3  0.7 - 4.0 K/uL Final  . Monocytes  Relative 12/19/2020 11  % Final  . Monocytes Absolute 12/19/2020 0.5  0.1 - 1.0 K/uL Final  . Eosinophils Relative 12/19/2020 1  % Final  . Eosinophils Absolute 12/19/2020 0.0  0.0 - 0.5 K/uL Final  . Basophils Relative 12/19/2020 1  % Final  . Basophils Absolute 12/19/2020 0.0  0.0 - 0.1 K/uL Final  . Immature Granulocytes 12/19/2020 1  % Final  . Abs Immature Granulocytes 12/19/2020 0.02  0.00 - 0.07 K/uL Final   Performed at [facility], [address], [place], [place] [address]  . Ferritin 12/19/2020 19  11 - 307 ng/mL Final   Performed at [facility], [address] [place], [place] [address]  . Iron 12/19/2020 108  28 - 170 ug/dL Final  . TIBC 12/19/2020 396  250 - 450 ug/dL Final  . Saturation Ratios 12/19/2020 27  10.4 - 31.8 % Final  . UIBC 12/19/2020 288  ug/dL Final   Performed at [facility], [address] [place], [place] [address]  . Vitamin B-12 12/19/2020 1,318* 180 - 914 pg/mL Final   Comment: (NOTE) This assay is not validated for testing neonatal or myeloproliferative syndrome specimens for Vitamin B12 levels. Performed at [facility], [place] [address], [place], [place] [address]   . Folate 12/19/2020 13.5  >5.9 ng/mL Final   Performed at [facility], [address] [place], [place] [address]  . TSH 12/19/2020 2.457  0.350 - 4.500 uIU/mL Final   Comment: Performed by a 3rd Generation assay with a functional sensitivity of <=0.01 uIU/mL. Performed at [facility], [address] [place], [place] [address]     Assessment:  Nicole Bishop is a 87 y.o. female African American woman with stage III chronic kidney diseaseandiron deficiency. Hematocrit and MCV were normal on 09/03/2013.   She denies any GI bleeding. Dietis modest. EGDon 04/10/2015 revealed a large hiatal hernia and gastritis. Biopsy was unremarkable. Colonoscopyon 04/10/2015 revealed diverticulosis in the sigmoid, descending,  transverse, and ascending colon. She had internal hemorrhoids.  She hasB12 deficiency. She [facility] (last 07/2016). She began oral B12in 08/2016. B12 was 748 on 11/08/2019.  Folate was 14.2 on 09/20/2019.   Labs on 01/25/2016revealed a hematocrit of 30 with an MCV of 67. Ferritin was 6 (low) with a TIBC of 487 (high). Normal studies included direct [place], folic acid, X10, haptoglobin, erythropoietin, and SPEP.   Labs on 09/20/2019 revealed a hematocrit 34.1, hemoglobin 10.8, MCV 88.6, platelets 214,000, WBC 4,200. Ferritin was 29 with an iron saturation of 12% and a TIBC of 330. Retic was 1.6%. B12, folate, and TSH were normal.  Sed rate 29.   She has received weekly Venofer: 800 mg(10/21/2014 - 11/11/2014), 400 mg (03/03/2015 and 03/10/2015), 400 mg (10/20/2015 and 10/27/2015), 200 mg (04/16/2016), 600 mg (11/11/2016 - 11/25/2016), 600 mg (05/23/2017- 06/06/2017), 400 mg (02/06/2018 - 02/13/2018), and 200 mg (06/11/2018), 400 mg (09/27/2019 - 10/04/2019), and 600 mg (09/11/2020, 09/18/2020, and 10/02/2020).She receives Venofer if her ferritin is < 30.   Ferritinhas been followed: 14 on 12/28/2014, 9 on 02/23/2015, 113 on 03/17/2015, 12 on 10/13/2015, 31 on 01/10/2016, 19 on 04/11/2016, 23 on 07/18/2016, 9 on 11/11/2016, 9 on 05/13/2017, 46 on 08/07/2017, 41 on 11/10/2017, 32  on 02/03/2018, 132 on 05/11/2018, 77 on 06/11/2018, 29 on 09/20/2019, 50 on 11/08/2019, 39 on 12/20/2019, 45 on 03/21/2020, 30 on 06/21/2020 and 26 on 08/29/2020. Goal ferritinis 100.  She was admitted to [place] 06/09/2018 -10/25/2019with a right lower extremity DVT.  Duplex revealed an occlusive thrombus in the proximal and mid right femoral vein with nonocclusive thrombus in the distal femoral vein and right popliteal vein.  She was discharged on Xarelto.   She received COVID vaccine on 09/17/2019.   Symptomatically, she feels "pretty good."   She stopped  taking oral B12. She takes oral iron BID. She has shortness of breath on exertion.  She has not been taking any new medications or herbal products.  Exam is stable.  Plan: 1.   Labs today: CBC with diff, Ferritin, Iron studies, B12, TSH, Folate.  2.   Iron deficiency anemia  Labs from 12/27/2020 show hemoglobin of 11, MCV 87.7, ferritin 19 and iron saturations 27%.  She is on oral iron BID.              Ferritin goal 100.             She has received IV iron in the past (last 10/02/2020).   Recommended for additional doses of IV Venofer.  She will get first dose today. 3.   B12 deficiency  Patient reports she has been taking her B12 three times per week.   B12 is 1318 today.   B12 goal 400.  Can reduce B12 level to twice per week. 4.   RIGHTlower extremity DVT Patient continue Xarelto. She denies any bleeding. 5.   Stage III chronic kidney disease Creatinine 1.6 on 11/22/20.  HGB 11.0 (12/19/2020.  Consider Retacrit if hemoglobin < 10 and iron stores replete. 6.   Weight loss  Continue to encourage caloric intake.    Son and daughter preparing meals for Ms. Pieczynski.   Continue to monitor. 7.   Mild leukopenia  WBC 3300 with an ANC of 1700.  Patient denies any new medications or herbal products.  Continue B12 supplements twice weekly. 8. TSH normal  TSH 2.457 (12/19/2020).  Disposition IV Venofer today, and weekly x4 doses (Wednesday's/ afternoon's).  RTC in 3 months for MD assessment and  labs (CBC with diff, ferritin, iron studies, B12, folate, TSH, Cr---day before).  The patient's diagnosis, an outline of the further diagnostic and laboratory studies which will be required, the recommendation for surgery, and alternatives were discussed with her and her accompanying family members.  All questions were answered to their satisfaction.  I personally had a face to face interaction and evaluated the patient jointly with the NP Student, Mrs. Benedetto Goad.  I have reviewed her  history and available records and have performed the key portions of the physical exam including general, HEENT, abdominal exam, pelvic exam with my findings confirming those documented above by the APP student.  I have discussed the case with the APP student and the patient.  I agree with the above documentation, assessment and plan which was fully formulated by me.  Counseling was completed by me.    Benedetto Goad, Student FNP  Greater than 50% was spent in counseling and coordination of care with this patient including but not limited to discussion of the relevant topics above (See A&P) including, but not limited to diagnosis and management of acute and chronic medical conditions.   Faythe Casa, NP 12/28/2020 3:01 PM

## 2020-12-27 NOTE — Progress Notes (Signed)
Pt states at times she is hard of hearing in her left ear.

## 2020-12-28 DIAGNOSIS — N1832 Chronic kidney disease, stage 3b: Secondary | ICD-10-CM | POA: Diagnosis not present

## 2020-12-28 DIAGNOSIS — D631 Anemia in chronic kidney disease: Secondary | ICD-10-CM | POA: Diagnosis not present

## 2020-12-28 DIAGNOSIS — I10 Essential (primary) hypertension: Secondary | ICD-10-CM | POA: Diagnosis not present

## 2020-12-28 DIAGNOSIS — N2581 Secondary hyperparathyroidism of renal origin: Secondary | ICD-10-CM | POA: Diagnosis not present

## 2020-12-28 DIAGNOSIS — N184 Chronic kidney disease, stage 4 (severe): Secondary | ICD-10-CM | POA: Diagnosis not present

## 2020-12-28 DIAGNOSIS — I701 Atherosclerosis of renal artery: Secondary | ICD-10-CM | POA: Diagnosis not present

## 2020-12-29 DIAGNOSIS — N1832 Chronic kidney disease, stage 3b: Secondary | ICD-10-CM | POA: Diagnosis not present

## 2021-01-05 DIAGNOSIS — L851 Acquired keratosis [keratoderma] palmaris et plantaris: Secondary | ICD-10-CM | POA: Diagnosis not present

## 2021-01-05 DIAGNOSIS — B351 Tinea unguium: Secondary | ICD-10-CM | POA: Diagnosis not present

## 2021-01-05 DIAGNOSIS — N183 Chronic kidney disease, stage 3 unspecified: Secondary | ICD-10-CM | POA: Diagnosis not present

## 2021-01-12 DIAGNOSIS — H43813 Vitreous degeneration, bilateral: Secondary | ICD-10-CM | POA: Diagnosis not present

## 2021-02-27 ENCOUNTER — Other Ambulatory Visit: Payer: Self-pay

## 2021-02-27 ENCOUNTER — Inpatient Hospital Stay: Payer: Medicare HMO | Attending: Internal Medicine

## 2021-02-27 ENCOUNTER — Inpatient Hospital Stay (HOSPITAL_BASED_OUTPATIENT_CLINIC_OR_DEPARTMENT_OTHER): Payer: Medicare HMO | Admitting: Internal Medicine

## 2021-02-27 ENCOUNTER — Inpatient Hospital Stay: Payer: Medicare HMO

## 2021-02-27 VITALS — BP 157/98 | HR 73 | Resp 20

## 2021-02-27 DIAGNOSIS — D509 Iron deficiency anemia, unspecified: Secondary | ICD-10-CM

## 2021-02-27 DIAGNOSIS — I10 Essential (primary) hypertension: Secondary | ICD-10-CM | POA: Insufficient documentation

## 2021-02-27 DIAGNOSIS — Z79899 Other long term (current) drug therapy: Secondary | ICD-10-CM | POA: Diagnosis not present

## 2021-02-27 DIAGNOSIS — Z7951 Long term (current) use of inhaled steroids: Secondary | ICD-10-CM | POA: Insufficient documentation

## 2021-02-27 DIAGNOSIS — N1832 Chronic kidney disease, stage 3b: Secondary | ICD-10-CM

## 2021-02-27 DIAGNOSIS — G4730 Sleep apnea, unspecified: Secondary | ICD-10-CM | POA: Diagnosis not present

## 2021-02-27 DIAGNOSIS — Z86718 Personal history of other venous thrombosis and embolism: Secondary | ICD-10-CM | POA: Diagnosis not present

## 2021-02-27 DIAGNOSIS — E538 Deficiency of other specified B group vitamins: Secondary | ICD-10-CM | POA: Diagnosis not present

## 2021-02-27 DIAGNOSIS — Z7901 Long term (current) use of anticoagulants: Secondary | ICD-10-CM | POA: Insufficient documentation

## 2021-02-27 DIAGNOSIS — N183 Chronic kidney disease, stage 3 unspecified: Secondary | ICD-10-CM | POA: Insufficient documentation

## 2021-02-27 DIAGNOSIS — D649 Anemia, unspecified: Secondary | ICD-10-CM | POA: Diagnosis not present

## 2021-02-27 DIAGNOSIS — I252 Old myocardial infarction: Secondary | ICD-10-CM | POA: Insufficient documentation

## 2021-02-27 DIAGNOSIS — R5383 Other fatigue: Secondary | ICD-10-CM | POA: Diagnosis not present

## 2021-02-27 LAB — CBC WITH DIFFERENTIAL/PLATELET
Abs Immature Granulocytes: 0.01 10*3/uL (ref 0.00–0.07)
Basophils Absolute: 0 10*3/uL (ref 0.0–0.1)
Basophils Relative: 1 %
Eosinophils Absolute: 0 10*3/uL (ref 0.0–0.5)
Eosinophils Relative: 1 %
HCT: 35.9 % — ABNORMAL LOW (ref 36.0–46.0)
Hemoglobin: 11.4 g/dL — ABNORMAL LOW (ref 12.0–15.0)
Immature Granulocytes: 0 %
Lymphocytes Relative: 29 %
Lymphs Abs: 1.1 10*3/uL (ref 0.7–4.0)
MCH: 27.4 pg (ref 26.0–34.0)
MCHC: 31.8 g/dL (ref 30.0–36.0)
MCV: 86.3 fL (ref 80.0–100.0)
Monocytes Absolute: 0.4 10*3/uL (ref 0.1–1.0)
Monocytes Relative: 10 %
Neutro Abs: 2.2 10*3/uL (ref 1.7–7.7)
Neutrophils Relative %: 59 %
Platelets: 224 10*3/uL (ref 150–400)
RBC: 4.16 MIL/uL (ref 3.87–5.11)
RDW: 15.9 % — ABNORMAL HIGH (ref 11.5–15.5)
WBC: 3.7 10*3/uL — ABNORMAL LOW (ref 4.0–10.5)
nRBC: 0 % (ref 0.0–0.2)

## 2021-02-27 LAB — IRON AND TIBC
Iron: 37 ug/dL (ref 28–170)
Saturation Ratios: 9 % — ABNORMAL LOW (ref 10.4–31.8)
TIBC: 406 ug/dL (ref 250–450)
UIBC: 369 ug/dL

## 2021-02-27 LAB — VITAMIN B12: Vitamin B-12: 1825 pg/mL — ABNORMAL HIGH (ref 180–914)

## 2021-02-27 LAB — FERRITIN: Ferritin: 23 ng/mL (ref 11–307)

## 2021-02-27 LAB — TSH: TSH: 2.028 u[IU]/mL (ref 0.350–4.500)

## 2021-02-27 LAB — CREATININE, SERUM
Creatinine, Ser: 1.53 mg/dL — ABNORMAL HIGH (ref 0.44–1.00)
GFR, Estimated: 33 mL/min — ABNORMAL LOW (ref >60)

## 2021-02-27 LAB — FOLATE: Folate: 11.4 ng/mL (ref >5.9)

## 2021-02-27 MED ORDER — SODIUM CHLORIDE 0.9 % IV SOLN
Freq: Once | INTRAVENOUS | Status: AC
Start: 2021-02-27 — End: 2021-02-27
  Filled 2021-02-27: qty 250

## 2021-02-27 MED ORDER — IRON SUCROSE 20 MG/ML IV SOLN
200.0000 mg | Freq: Once | INTRAVENOUS | Status: AC
  Administered 2021-02-27: 200 mg via INTRAVENOUS

## 2021-02-27 NOTE — Progress Notes (Signed)
[facility] NOTE  Patient Care Team: Tracie Harrier, MD as PCP - General (Internal Medicine) Manya Silvas, MD (Inactive) (Gastroenterology) Ok Edwards, NP as Nurse Practitioner (Gastroenterology) Anthonette Legato, MD (Internal Medicine) Marval Regal, NP as Nurse Practitioner (Nurse Practitioner)  CHIEF COMPLAINTS/PURPOSE OF CONSULTATION:  Iron deficiency anemia  #Iron deficiency anemia/CKD stage III-on Venofer  # CKD- III    Oncology History   No history exists.     HISTORY OF PRESENTING ILLNESS:  Nicole Bishop 87 y.o.  female with mild iron deficiency anemia secondary to CKD is here for follow-up.  Patient denies any blood in stools or black or stools.  Denies any nausea vomiting abdominal pain.   Patient complains of fatigue on exertion.  None at rest.  Review of Systems  Constitutional:  Positive for malaise/fatigue. Negative for chills, diaphoresis, fever and weight loss.  HENT:  Negative for nosebleeds and sore throat.   Eyes:  Negative for double vision.  Respiratory:  Negative for cough, hemoptysis, sputum production, shortness of breath and wheezing.   Cardiovascular:  Negative for chest pain, palpitations, orthopnea and leg swelling.  Gastrointestinal:  Negative for abdominal pain, blood in stool, constipation, diarrhea, heartburn, melena, nausea and vomiting.  Genitourinary:  Negative for dysuria, frequency and urgency.  Musculoskeletal:  Negative for back pain and joint pain.  Skin: Negative.  Negative for itching and rash.  Neurological:  Negative for dizziness, tingling, focal weakness, weakness and headaches.  Endo/Heme/Allergies:  Does not bruise/bleed easily.  Psychiatric/Behavioral:  Negative for depression. The patient is not nervous/anxious and does not have insomnia.     MEDICAL HISTORY:  Past Medical History:  Diagnosis Date   Anemia    Anxiety    Arthritis    Chronic kidney disease    Coronary  artery disease    Depression    Dizziness    Dyspnea    Dysrhythmia    gets tachy if anxious or excited   GERD (gastroesophageal reflux disease)    History of hiatal hernia    HOH (hard of hearing)    Hypertension    Hypothyroidism    IDA (iron deficiency anemia) 12/14/2014   Myocardial infarction [facility]) 1992   Renal insufficiency    Sleep apnea    Tremors of nervous system     SURGICAL HISTORY: Past Surgical History:  Procedure Laterality Date   CATARACT EXTRACTION W/PHACO Left 07/15/2017   Procedure: CATARACT EXTRACTION PHACO AND INTRAOCULAR LENS PLACEMENT (IOC)-LEFT;  Surgeon: Birder Robson, MD;  Location: [facility] ORS;  Service: Ophthalmology;  Laterality: Left;  [place] 00.36.1 AP% 13.9 CDE 5.02 Fluid Pack lot # [id number]   CATARACT EXTRACTION W/PHACO Right 08/26/2017   Procedure: CATARACT EXTRACTION PHACO AND INTRAOCULAR LENS PLACEMENT (IOC);  Surgeon: Birder Robson, MD;  Location: [facility] ORS;  Service: Ophthalmology;  Laterality: Right;  [place] 00:41.0 AP% 15.3 CDE 6.27 FLUID PACK LOT # [id number]   CHOLECYSTECTOMY     COLONOSCOPY WITH PROPOFOL N/A 04/10/2015   Procedure: COLONOSCOPY WITH PROPOFOL;  Surgeon: Manya Silvas, MD;  Location: [facility] ENDOSCOPY;  Service: Endoscopy;  Laterality: N/A;   ESOPHAGOGASTRODUODENOSCOPY (EGD) WITH PROPOFOL N/A 04/10/2015   Procedure: ESOPHAGOGASTRODUODENOSCOPY (EGD) WITH PROPOFOL;  Surgeon: Manya Silvas, MD;  Location: [facility] ENDOSCOPY;  Service: Endoscopy;  Laterality: N/A;   fatty tumor removed on left shoulder     gallbladdedr removed     JOINT REPLACEMENT Bilateral    total knee replacements   REPLACEMENT TOTAL KNEE BILATERAL Bilateral  SOCIAL HISTORY: Social History   Socioeconomic History   Marital status: Widowed    Spouse name: Not on file   Number of children: 9   Years of education: Not on file   Highest education level: 11th grade  Occupational History    Comment: retired  Tobacco Use   Smoking status: Never    Smokeless tobacco: Never  Vaping Use   Vaping Use: Never used  Substance and Sexual Activity   Alcohol use: No   Drug use: No   Sexual activity: Never  Other Topics Concern   Not on file  Social History Narrative   Not on file   Social Determinants of Health   Financial Resource Strain: Not on file  Food Insecurity: Not on file  Transportation Needs: Not on file  Physical Activity: Not on file  Stress: Not on file  Social Connections: Not on file  Intimate Partner Violence: Not on file    FAMILY HISTORY: Family History  Problem Relation Age of Onset   Arthritis Mother    Hypertension Mother    Stroke Mother    Hypertension Father    Heart attack Father    Breast cancer Neg Hx     ALLERGIES:  is allergic to penicillins, nsaids, rosuvastatin, atorvastatin, celebrex [celecoxib], felodipine, oxaprozin, rofecoxib, and simvastatin.  MEDICATIONS:  Current Outpatient Medications  Medication Sig Dispense Refill   acetaminophen (TYLENOL) 650 MG CR tablet Take 650 mg by mouth every 8 (eight) hours as needed for pain.     amLODipine (NORVASC) 10 MG tablet      amLODipine (NORVASC) 5 MG tablet Take 5 mg by mouth daily.     benazepril (LOTENSIN) 5 MG tablet Take 5 mg by mouth daily.     calcitRIOL (ROCALTROL) 0.25 MCG capsule Take 1 capsule by mouth daily.     calcitRIOL (ROCALTROL) 0.25 MCG capsule Take by mouth.     Cholecalciferol (VITAMIN D3) 2000 units capsule Take 4,000 Units by mouth daily.     cromolyn (OPTICROM) 4 % ophthalmic solution SMARTSIG:In Eye(s)     docusate sodium (COLACE) 100 MG capsule Take 100 mg by mouth 2 (two) times daily.     esomeprazole (NEXIUM) 40 MG capsule Take 40 mg by mouth daily as needed (acid reflux).     Ferrous Sulfate 142 (45 Fe) MG TBCR Take 1 tablet by mouth 2 (two) times daily.      hydrALAZINE (APRESOLINE) 25 MG tablet      hydrALAZINE (APRESOLINE) 25 MG tablet Take 1 tablet by mouth 2 (two) times daily.     lactulose (CHRONULAC) 10  GM/15ML solution Take 10 g by mouth daily as needed for mild constipation.      loratadine (CLARITIN) 10 MG tablet Take 1 tablet (10 mg total) by mouth daily. 30 tablet 0   metoprolol succinate (TOPROL-XL) 50 MG 24 hr tablet Take 1 tablet (50 mg total) by mouth daily. Take with or immediately following a meal. (Patient taking differently: Take 25 mg by mouth daily. Take with or immediately following a meal.) 30 tablet 0   mometasone (NASONEX) 50 MCG/ACT nasal spray Place 2 sprays into the nose daily as needed (allergies).     olopatadine (PATANOL) 0.1 % ophthalmic solution Place 1 drop into both eyes 2 (two) times daily as needed for allergies.      polyethylene glycol (MIRALAX / GLYCOLAX) packet Take 17 g by mouth daily as needed for mild constipation or moderate constipation.     QUEtiapine (  SEROQUEL) 50 MG tablet TAKE 1 TABLET BY MOUTH EVERY DAY AT NIGHT     risperiDONE (RISPERDAL) 0.25 MG tablet Take 1 tablet by mouth daily.     rivaroxaban (XARELTO) 20 MG TABS tablet Take 1 tablet (20 mg total) by mouth daily with supper. 30 tablet 0   senna-docusate (SENOKOT-S) 8.6-50 MG tablet Take 1 tablet by mouth 2 (two) times daily.     olmesartan (BENICAR) 20 MG tablet Take 20 mg by mouth daily.      No current facility-administered medications for this visit.   Facility-Administered Medications Ordered in Other Visits  Medication Dose Route Frequency Provider Last Rate Last Admin   0.9 %  sodium chloride infusion   Intravenous Continuous Lequita Asal, MD   Stopped at 02/06/18 1429      .  PHYSICAL EXAMINATION: ECOG PERFORMANCE STATUS: 1 - Symptomatic but completely ambulatory  Vitals:   02/27/21 1136  BP: (!) 144/99  Pulse: 72  Resp: (!) 22  Temp: 98.9 F (37.2 C)  SpO2: 98%   Filed Weights   02/27/21 1136  Weight: 249 lb 1.9 oz (113 kg)    Physical Exam Vitals and nursing note reviewed.  Constitutional:      Comments: Ambulating: in wheelchair  with family  HENT:      Head: Normocephalic and atraumatic.     Mouth/Throat:     Pharynx: Oropharynx is clear.  Eyes:     Extraocular Movements: Extraocular movements intact.     Pupils: Pupils are equal, round, and reactive to light.  Cardiovascular:     Rate and Rhythm: Normal rate and regular rhythm.  Pulmonary:     Comments: Decreased breath sounds bilaterally.  Abdominal:     Palpations: Abdomen is soft.  Musculoskeletal:        General: Normal range of motion.     Cervical back: Normal range of motion.  Skin:    General: Skin is warm.  Neurological:     General: No focal deficit present.     Mental Status: She is alert and oriented to person, place, and time.  Psychiatric:        Behavior: Behavior normal.        Judgment: Judgment normal.     LABORATORY DATA:  I have reviewed the data as listed Lab Results  Component Value Date   WBC 3.7 (L) 02/27/2021   HGB 11.4 (L) 02/27/2021   HCT 35.9 (L) 02/27/2021   MCV 86.3 02/27/2021   PLT 224 02/27/2021   Recent Labs    02/28/20 1425 06/21/20 1111 10/18/20 1017 02/27/21 1122  NA 139 140  --   --   K 4.4 4.1  --   --   CL 107 105  --   --   CO2 22 24  --   --   GLUCOSE 99 99  --   --   BUN 23 31*  --   --   CREATININE 1.80* 1.65* 1.84* 1.53*  CALCIUM 9.0 9.3  --   --   GFRNONAA 25* 30* 26* 33*  GFRAA 29*  --   --   --   PROT 7.0  --   --   --   ALBUMIN 3.7  --   --   --   AST 14*  --   --   --   ALT 12  --   --   --   ALKPHOS 57  --   --   --  BILITOT 0.7  --   --   --     RADIOGRAPHIC STUDIES: I have personally reviewed the radiological images as listed and agreed with the findings in the report. No results found.  ASSESSMENT & PLAN:   Symptomatic anemia #  Iron deficiency anemia/ likley sec to CKD stage III/-today hemoglobin 11.4.;  Iron studies pending.  Proceed with Venofer given symptomatic improvement post infusion.  # ? B12 deficiency: on PO supp.   #History of LE DVT on Xarelto-.  #CKD stage  III-stable  DISPOSITION: # venofer today- if possible; if not next week- weekly x3 [total] # follow up in  3 months for MD and  labs (CBC with diff,cmp;]; possible venofer- Dr.B    All questions were answered. The patient knows to call the clinic with any problems, questions or concerns.       Cammie Sickle, MD 02/27/2021 1:01 PM

## 2021-02-27 NOTE — Assessment & Plan Note (Addendum)
#    Iron deficiency anemia/ likley sec to CKD stage III/-today hemoglobin 11.4.;  Iron studies pending.  Proceed with Venofer given symptomatic improvement post infusion.  # ? B12 deficiency: on PO supp.   #History of LE DVT on Xarelto-.  #CKD stage III-stable  DISPOSITION: # venofer today- if possible; if not next week- weekly x3 [total] # follow up in  3 months for MD and  labs (CBC with diff,cmp;]; possible venofer- Dr.B

## 2021-03-08 ENCOUNTER — Inpatient Hospital Stay: Payer: Medicare HMO

## 2021-03-08 VITALS — BP 138/79 | HR 67 | Temp 97.9°F | Resp 16

## 2021-03-08 DIAGNOSIS — Z7901 Long term (current) use of anticoagulants: Secondary | ICD-10-CM | POA: Diagnosis not present

## 2021-03-08 DIAGNOSIS — D509 Iron deficiency anemia, unspecified: Secondary | ICD-10-CM | POA: Diagnosis not present

## 2021-03-08 DIAGNOSIS — R5383 Other fatigue: Secondary | ICD-10-CM | POA: Diagnosis not present

## 2021-03-08 DIAGNOSIS — G4730 Sleep apnea, unspecified: Secondary | ICD-10-CM | POA: Diagnosis not present

## 2021-03-08 DIAGNOSIS — I252 Old myocardial infarction: Secondary | ICD-10-CM | POA: Diagnosis not present

## 2021-03-08 DIAGNOSIS — Z86718 Personal history of other venous thrombosis and embolism: Secondary | ICD-10-CM | POA: Diagnosis not present

## 2021-03-08 DIAGNOSIS — N183 Chronic kidney disease, stage 3 unspecified: Secondary | ICD-10-CM | POA: Diagnosis not present

## 2021-03-08 DIAGNOSIS — E538 Deficiency of other specified B group vitamins: Secondary | ICD-10-CM | POA: Diagnosis not present

## 2021-03-08 DIAGNOSIS — I10 Essential (primary) hypertension: Secondary | ICD-10-CM | POA: Diagnosis not present

## 2021-03-08 MED ORDER — SODIUM CHLORIDE 0.9 % IV SOLN
Freq: Once | INTRAVENOUS | Status: AC
  Filled 2021-03-08: qty 250

## 2021-03-08 MED ORDER — IRON SUCROSE 20 MG/ML IV SOLN
200.0000 mg | Freq: Once | INTRAVENOUS | Status: AC
  Administered 2021-03-08: 200 mg via INTRAVENOUS
  Filled 2021-03-08: qty 10

## 2021-03-08 NOTE — Patient Instructions (Signed)

## 2021-03-15 ENCOUNTER — Inpatient Hospital Stay: Payer: Medicare HMO

## 2021-03-15 VITALS — BP 119/93 | HR 84 | Temp 97.2°F | Resp 18

## 2021-03-15 DIAGNOSIS — Z7901 Long term (current) use of anticoagulants: Secondary | ICD-10-CM | POA: Diagnosis not present

## 2021-03-15 DIAGNOSIS — N183 Chronic kidney disease, stage 3 unspecified: Secondary | ICD-10-CM | POA: Diagnosis not present

## 2021-03-15 DIAGNOSIS — G4730 Sleep apnea, unspecified: Secondary | ICD-10-CM | POA: Diagnosis not present

## 2021-03-15 DIAGNOSIS — D509 Iron deficiency anemia, unspecified: Secondary | ICD-10-CM

## 2021-03-15 DIAGNOSIS — I252 Old myocardial infarction: Secondary | ICD-10-CM | POA: Diagnosis not present

## 2021-03-15 DIAGNOSIS — R5383 Other fatigue: Secondary | ICD-10-CM | POA: Diagnosis not present

## 2021-03-15 DIAGNOSIS — Z86718 Personal history of other venous thrombosis and embolism: Secondary | ICD-10-CM | POA: Diagnosis not present

## 2021-03-15 DIAGNOSIS — E538 Deficiency of other specified B group vitamins: Secondary | ICD-10-CM | POA: Diagnosis not present

## 2021-03-15 DIAGNOSIS — I10 Essential (primary) hypertension: Secondary | ICD-10-CM | POA: Diagnosis not present

## 2021-03-15 MED ORDER — IRON SUCROSE 20 MG/ML IV SOLN
200.0000 mg | Freq: Once | INTRAVENOUS | Status: AC
  Administered 2021-03-15: 200 mg via INTRAVENOUS
  Filled 2021-03-15: qty 10

## 2021-03-15 MED ORDER — CYANOCOBALAMIN 1000 MCG/ML IJ SOLN: 1000.0000 ug | Freq: Once | INTRAMUSCULAR | Status: DC

## 2021-03-15 NOTE — Patient Instructions (Signed)
[facility]  Discharge Instructions: Thank you for choosing [facility] to provide your oncology and hematology care.  If you have a lab appointment with the Cancer Center, please go directly to the Cancer Center and check in at the registration area.  Wear comfortable clothing and clothing appropriate for easy access to any Portacath or PICC line.   We strive to give you quality time with your provider. You may need to reschedule your appointment if you arrive late (15 or more minutes).  Arriving late affects you and other patients whose appointments are after yours.  Also, if you miss three or more appointments without notifying the office, you may be dismissed from the clinic at the provider's discretion.      For prescription refill requests, have your pharmacy contact our office and allow 72 hours for refills to be completed.    Today you received the following chemotherapy and/or immunotherapy agents venofer       To help prevent nausea and vomiting after your treatment, we encourage you to take your nausea medication as directed.  BELOW ARE SYMPTOMS THAT SHOULD BE REPORTED IMMEDIATELY: *FEVER GREATER THAN 100.4 F (38 C) OR HIGHER *CHILLS OR SWEATING *NAUSEA AND VOMITING THAT IS NOT CONTROLLED WITH YOUR NAUSEA MEDICATION *UNUSUAL SHORTNESS OF BREATH *UNUSUAL BRUISING OR BLEEDING *URINARY PROBLEMS (pain or burning when urinating, or frequent urination) *BOWEL PROBLEMS (unusual diarrhea, constipation, pain near the anus) TENDERNESS IN MOUTH AND THROAT WITH OR WITHOUT PRESENCE OF ULCERS (sore throat, sores in mouth, or a toothache) UNUSUAL RASH, SWELLING OR PAIN  UNUSUAL VAGINAL DISCHARGE OR ITCHING   Items with * indicate a potential emergency and should be followed up as soon as possible or go to the Emergency Department if any problems should occur.  Please show the CHEMOTHERAPY ALERT CARD or IMMUNOTHERAPY ALERT CARD at check-in  to the Emergency Department and triage nurse.  Should you have questions after your visit or need to cancel or reschedule your appointment, please contact [facility]  [phone] and follow the prompts.  Office hours are 8:00 a.m. to 4:30 p.m. Monday - Friday. Please note that voicemails left after 4:00 p.m. may not be returned until the following business day.  We are closed weekends and major holidays. You have access to a nurse at all times for urgent questions. Please call the main number to the clinic [phone] and follow the prompts.  For any non-urgent questions, you may also contact your provider using MyChart. We now offer e-Visits for anyone 18 and older to request care online for non-urgent symptoms. For details visit [email or web address].   Also download the MyChart app! Go to the app store, search "MyChart", open the app, select [facility], and log in with your MyChart username and password.  Due to Covid, a mask is required upon entering the hospital/clinic. If you do not have a mask, one will be given to you upon arrival. For doctor visits, patients may have 1 support person aged 18 or older with them. For treatment visits, patients cannot have anyone with them due to current Covid guidelines and our immunocompromised population.   Iron Sucrose injection What is this medication? IRON SUCROSE (AHY ern SOO krohs) is an iron complex. Iron is used to make healthy red blood cells, which carry oxygen and nutrients throughout the body. This medicine is used to treat iron deficiency anemia in people with chronickidney disease. This medicine may be used for   other purposes; ask your health care provider orpharmacist if you have questions. COMMON BRAND NAME(S): Venofer What should I tell my care team before I take this medication? They need to know if you have any of these conditions: anemia not caused by low iron levels heart disease high levels of  iron in the blood kidney disease liver disease an unusual or allergic reaction to iron, other medicines, foods, dyes, or preservatives pregnant or trying to get pregnant breast-feeding How should I use this medication? This medicine is for infusion into a vein. It is given by a health careprofessional in a hospital or clinic setting. Talk to your pediatrician regarding the use of this medicine in children. While this drug may be prescribed for children as young as 2 years for selectedconditions, precautions do apply. Overdosage: If you think you have taken too much of this medicine contact apoison control center or emergency room at once. NOTE: This medicine is only for you. Do not share this medicine with others. What if I miss a dose? It is important not to miss your dose. Call your doctor or health careprofessional if you are unable to keep an appointment. What may interact with this medication? Do not take this medicine with any of the following medications: deferoxamine dimercaprol other iron products This medicine may also interact with the following medications: chloramphenicol deferasirox This list may not describe all possible interactions. Give your health care provider a list of all the medicines, herbs, non-prescription drugs, or dietary supplements you use. Also tell them if you smoke, drink alcohol, or use illegaldrugs. Some items may interact with your medicine. What should I watch for while using this medication? Visit your doctor or healthcare professional regularly. Tell your doctor or healthcare professional if your symptoms do not start to get better or if theyget worse. You may need blood work done while you are taking this medicine. You may need to follow a special diet. Talk to your doctor. Foods that contain iron include: whole grains/cereals, dried fruits, beans, or peas, leafy greenvegetables, and organ meats (liver, kidney). What side effects may I notice from  receiving this medication? Side effects that you should report to your doctor or health care professionalas soon as possible: allergic reactions like skin rash, itching or hives, swelling of the face, lips, or tongue breathing problems changes in blood pressure cough fast, irregular heartbeat feeling faint or lightheaded, falls fever or chills flushing, sweating, or hot feelings joint or muscle aches/pains seizures swelling of the ankles or feet unusually weak or tired Side effects that usually do not require medical attention (report to yourdoctor or health care professional if they continue or are bothersome): diarrhea feeling achy headache irritation at site where injected nausea, vomiting stomach upset tiredness This list may not describe all possible side effects. Call your doctor for medical advice about side effects. You may report side effects to FDA at1-800-FDA-1088. Where should I keep my medication? This drug is given in a hospital or clinic and will not be stored at home. NOTE: This sheet is a summary. It may not cover all possible information. If you have questions about this medicine, talk to your doctor, pharmacist, orhealth care provider.  2022 Elsevier/Gold Standard (2011-05-16 17:14:35)  

## 2021-04-02 DIAGNOSIS — I700 Atherosclerosis of aorta: Secondary | ICD-10-CM | POA: Diagnosis not present

## 2021-04-02 DIAGNOSIS — N1832 Chronic kidney disease, stage 3b: Secondary | ICD-10-CM | POA: Diagnosis not present

## 2021-04-02 DIAGNOSIS — M19012 Primary osteoarthritis, left shoulder: Secondary | ICD-10-CM | POA: Diagnosis not present

## 2021-04-02 DIAGNOSIS — R441 Visual hallucinations: Secondary | ICD-10-CM | POA: Diagnosis not present

## 2021-04-02 DIAGNOSIS — F0151 Vascular dementia with behavioral disturbance: Secondary | ICD-10-CM | POA: Diagnosis not present

## 2021-04-02 DIAGNOSIS — R7309 Other abnormal glucose: Secondary | ICD-10-CM | POA: Diagnosis not present

## 2021-04-02 DIAGNOSIS — I5032 Chronic diastolic (congestive) heart failure: Secondary | ICD-10-CM | POA: Diagnosis not present

## 2021-04-02 DIAGNOSIS — F330 Major depressive disorder, recurrent, mild: Secondary | ICD-10-CM | POA: Diagnosis not present

## 2021-04-02 DIAGNOSIS — I10 Essential (primary) hypertension: Secondary | ICD-10-CM | POA: Diagnosis not present

## 2021-04-04 DIAGNOSIS — I5032 Chronic diastolic (congestive) heart failure: Secondary | ICD-10-CM | POA: Diagnosis not present

## 2021-04-04 DIAGNOSIS — F0151 Vascular dementia with behavioral disturbance: Secondary | ICD-10-CM | POA: Diagnosis not present

## 2021-04-04 DIAGNOSIS — M19012 Primary osteoarthritis, left shoulder: Secondary | ICD-10-CM | POA: Diagnosis not present

## 2021-04-04 DIAGNOSIS — I10 Essential (primary) hypertension: Secondary | ICD-10-CM | POA: Diagnosis not present

## 2021-04-04 DIAGNOSIS — N1832 Chronic kidney disease, stage 3b: Secondary | ICD-10-CM | POA: Diagnosis not present

## 2021-04-04 DIAGNOSIS — R441 Visual hallucinations: Secondary | ICD-10-CM | POA: Diagnosis not present

## 2021-04-04 DIAGNOSIS — R7309 Other abnormal glucose: Secondary | ICD-10-CM | POA: Diagnosis not present

## 2021-04-04 DIAGNOSIS — F330 Major depressive disorder, recurrent, mild: Secondary | ICD-10-CM | POA: Diagnosis not present

## 2021-04-04 DIAGNOSIS — I700 Atherosclerosis of aorta: Secondary | ICD-10-CM | POA: Diagnosis not present

## 2021-04-09 DIAGNOSIS — R441 Visual hallucinations: Secondary | ICD-10-CM | POA: Diagnosis not present

## 2021-04-09 DIAGNOSIS — D649 Anemia, unspecified: Secondary | ICD-10-CM | POA: Diagnosis not present

## 2021-04-09 DIAGNOSIS — M79674 Pain in right toe(s): Secondary | ICD-10-CM | POA: Diagnosis not present

## 2021-04-09 DIAGNOSIS — F0281 Dementia in other diseases classified elsewhere with behavioral disturbance: Secondary | ICD-10-CM | POA: Diagnosis not present

## 2021-04-09 DIAGNOSIS — Z79899 Other long term (current) drug therapy: Secondary | ICD-10-CM | POA: Diagnosis not present

## 2021-04-09 DIAGNOSIS — Z0000 Encounter for general adult medical examination without abnormal findings: Secondary | ICD-10-CM | POA: Diagnosis not present

## 2021-04-09 DIAGNOSIS — I480 Paroxysmal atrial fibrillation: Secondary | ICD-10-CM | POA: Diagnosis not present

## 2021-04-09 DIAGNOSIS — I5032 Chronic diastolic (congestive) heart failure: Secondary | ICD-10-CM | POA: Diagnosis not present

## 2021-04-09 DIAGNOSIS — I130 Hypertensive heart and chronic kidney disease with heart failure and stage 1 through stage 4 chronic kidney disease, or unspecified chronic kidney disease: Secondary | ICD-10-CM | POA: Diagnosis not present

## 2021-04-09 DIAGNOSIS — N183 Chronic kidney disease, stage 3 unspecified: Secondary | ICD-10-CM | POA: Diagnosis not present

## 2021-04-09 DIAGNOSIS — B351 Tinea unguium: Secondary | ICD-10-CM | POA: Diagnosis not present

## 2021-04-09 DIAGNOSIS — M79675 Pain in left toe(s): Secondary | ICD-10-CM | POA: Diagnosis not present

## 2021-05-08 DIAGNOSIS — I10 Essential (primary) hypertension: Secondary | ICD-10-CM | POA: Diagnosis not present

## 2021-05-08 DIAGNOSIS — N1832 Chronic kidney disease, stage 3b: Secondary | ICD-10-CM | POA: Diagnosis not present

## 2021-05-08 DIAGNOSIS — N2581 Secondary hyperparathyroidism of renal origin: Secondary | ICD-10-CM | POA: Diagnosis not present

## 2021-05-08 DIAGNOSIS — I701 Atherosclerosis of renal artery: Secondary | ICD-10-CM | POA: Diagnosis not present

## 2021-05-08 DIAGNOSIS — D631 Anemia in chronic kidney disease: Secondary | ICD-10-CM | POA: Diagnosis not present

## 2021-05-30 ENCOUNTER — Inpatient Hospital Stay: Payer: Medicare HMO

## 2021-05-30 ENCOUNTER — Inpatient Hospital Stay: Payer: Medicare HMO | Attending: Internal Medicine

## 2021-05-30 ENCOUNTER — Other Ambulatory Visit: Payer: Self-pay

## 2021-05-30 ENCOUNTER — Inpatient Hospital Stay (HOSPITAL_BASED_OUTPATIENT_CLINIC_OR_DEPARTMENT_OTHER): Payer: Medicare HMO | Admitting: Internal Medicine

## 2021-05-30 ENCOUNTER — Other Ambulatory Visit: Payer: Self-pay | Admitting: Internal Medicine

## 2021-05-30 ENCOUNTER — Encounter: Payer: Self-pay | Admitting: Internal Medicine

## 2021-05-30 ENCOUNTER — Other Ambulatory Visit (INDEPENDENT_AMBULATORY_CARE_PROVIDER_SITE_OTHER): Payer: Self-pay | Admitting: Vascular Surgery

## 2021-05-30 VITALS — BP 148/94 | HR 73 | Resp 16

## 2021-05-30 VITALS — BP 125/84 | HR 62 | Temp 98.5°F | Resp 20 | Wt 242.6 lb

## 2021-05-30 DIAGNOSIS — I252 Old myocardial infarction: Secondary | ICD-10-CM | POA: Insufficient documentation

## 2021-05-30 DIAGNOSIS — E538 Deficiency of other specified B group vitamins: Secondary | ICD-10-CM | POA: Insufficient documentation

## 2021-05-30 DIAGNOSIS — N183 Chronic kidney disease, stage 3 unspecified: Secondary | ICD-10-CM | POA: Insufficient documentation

## 2021-05-30 DIAGNOSIS — D631 Anemia in chronic kidney disease: Secondary | ICD-10-CM | POA: Insufficient documentation

## 2021-05-30 DIAGNOSIS — D509 Iron deficiency anemia, unspecified: Secondary | ICD-10-CM

## 2021-05-30 DIAGNOSIS — Z7951 Long term (current) use of inhaled steroids: Secondary | ICD-10-CM | POA: Insufficient documentation

## 2021-05-30 DIAGNOSIS — I7141 Pararenal abdominal aortic aneurysm, without rupture: Secondary | ICD-10-CM

## 2021-05-30 DIAGNOSIS — D649 Anemia, unspecified: Secondary | ICD-10-CM

## 2021-05-30 DIAGNOSIS — R5383 Other fatigue: Secondary | ICD-10-CM | POA: Insufficient documentation

## 2021-05-30 DIAGNOSIS — Z7901 Long term (current) use of anticoagulants: Secondary | ICD-10-CM | POA: Diagnosis not present

## 2021-05-30 DIAGNOSIS — Z86718 Personal history of other venous thrombosis and embolism: Secondary | ICD-10-CM | POA: Diagnosis not present

## 2021-05-30 DIAGNOSIS — G4730 Sleep apnea, unspecified: Secondary | ICD-10-CM | POA: Insufficient documentation

## 2021-05-30 DIAGNOSIS — Z79899 Other long term (current) drug therapy: Secondary | ICD-10-CM | POA: Insufficient documentation

## 2021-05-30 DIAGNOSIS — N1832 Chronic kidney disease, stage 3b: Secondary | ICD-10-CM

## 2021-05-30 DIAGNOSIS — I129 Hypertensive chronic kidney disease with stage 1 through stage 4 chronic kidney disease, or unspecified chronic kidney disease: Secondary | ICD-10-CM | POA: Insufficient documentation

## 2021-05-30 LAB — CBC WITH DIFFERENTIAL/PLATELET
Abs Immature Granulocytes: 0.01 10*3/uL (ref 0.00–0.07)
Basophils Absolute: 0 10*3/uL (ref 0.0–0.1)
Basophils Relative: 1 %
Eosinophils Absolute: 0.1 10*3/uL (ref 0.0–0.5)
Eosinophils Relative: 2 %
HCT: 39 % (ref 36.0–46.0)
Hemoglobin: 12.1 g/dL (ref 12.0–15.0)
Immature Granulocytes: 0 %
Lymphocytes Relative: 31 %
Lymphs Abs: 1.1 10*3/uL (ref 0.7–4.0)
MCH: 27.3 pg (ref 26.0–34.0)
MCHC: 31 g/dL (ref 30.0–36.0)
MCV: 88 fL (ref 80.0–100.0)
Monocytes Absolute: 0.4 10*3/uL (ref 0.1–1.0)
Monocytes Relative: 12 %
Neutro Abs: 1.9 10*3/uL (ref 1.7–7.7)
Neutrophils Relative %: 54 %
Platelets: 215 10*3/uL (ref 150–400)
RBC: 4.43 MIL/uL (ref 3.87–5.11)
RDW: 16 % — ABNORMAL HIGH (ref 11.5–15.5)
WBC: 3.5 10*3/uL — ABNORMAL LOW (ref 4.0–10.5)
nRBC: 0 % (ref 0.0–0.2)

## 2021-05-30 LAB — COMPREHENSIVE METABOLIC PANEL
ALT: 10 U/L (ref 0–44)
AST: 14 U/L — ABNORMAL LOW (ref 15–41)
Albumin: 3.9 g/dL (ref 3.5–5.0)
Alkaline Phosphatase: 63 U/L (ref 38–126)
Anion gap: 10 (ref 5–15)
BUN: 32 mg/dL — ABNORMAL HIGH (ref 8–23)
CO2: 25 mmol/L (ref 22–32)
Calcium: 9.1 mg/dL (ref 8.9–10.3)
Chloride: 103 mmol/L (ref 98–111)
Creatinine, Ser: 1.72 mg/dL — ABNORMAL HIGH (ref 0.44–1.00)
GFR, Estimated: 28 mL/min — ABNORMAL LOW (ref >60)
Glucose, Bld: 96 mg/dL (ref 70–99)
Potassium: 3.9 mmol/L (ref 3.5–5.1)
Sodium: 138 mmol/L (ref 135–145)
Total Bilirubin: 0.5 mg/dL (ref 0.3–1.2)
Total Protein: 7.4 g/dL (ref 6.5–8.1)

## 2021-05-30 MED ORDER — IRON SUCROSE 20 MG/ML IV SOLN
200.0000 mg | Freq: Once | INTRAVENOUS | Status: AC
  Administered 2021-05-30: 200 mg via INTRAVENOUS
  Filled 2021-05-30: qty 10

## 2021-05-30 MED ORDER — SODIUM CHLORIDE 0.9 % IV SOLN
INTRAVENOUS | Status: DC
  Filled 2021-05-30: qty 250

## 2021-05-30 NOTE — Progress Notes (Signed)
Patient here for follow up she reports feeling very tired for a long time. She has fallen in the past few weeks and injured her finger on her right hand.

## 2021-05-30 NOTE — Progress Notes (Signed)
[place] NOTE  Patient Care Team: Tracie Harrier, MD as PCP - General (Internal Medicine) Manya Silvas, MD (Inactive) (Gastroenterology) Ok Edwards, NP as Nurse Practitioner (Gastroenterology) Anthonette Legato, MD (Internal Medicine) Marval Regal, NP as Nurse Practitioner (Nurse Practitioner)  CHIEF COMPLAINTS/PURPOSE OF CONSULTATION:  Iron deficiency anemia  #Iron deficiency anemia/CKD stage III-on Venofer  # CKD- III [[facility], Nephrology]    Oncology History   No history exists.   Results for TEQUISHA, MAAHS (MRN [id number]) as of 05/30/2021 11:08  Ref. Range 10/18/2020 10:17 11/23/2020 09:25 12/19/2020 11:26 02/27/2021 11:22 05/30/2021 10:26  Iron Latest Ref Range: 28 - 170 ug/dL 48  108 37   UIBC Latest Units: ug/dL 285  288 369   TIBC Latest Ref Range: 250 - 450 ug/dL 333  396 406   Saturation Ratios Latest Ref Range: 10.4 - 31.8 % 14  27 9  (L)   Ferritin Latest Ref Range: 11 - 307 ng/mL 85  19 23     HISTORY OF PRESENTING ILLNESS: alone; in a wheel chair.  Nicole Bishop 87 y.o.  female with mild iron deficiency anemia secondary to CKD-III is here for follow-up.  Patient denies any blood in stools or black-colored stools.  She complains of fatigue with exertion.  None at rest.  No nausea no abdominal pain.   Review of Systems  Constitutional:  Positive for malaise/fatigue. Negative for chills, diaphoresis, fever and weight loss.  HENT:  Negative for nosebleeds and sore throat.   Eyes:  Negative for double vision.  Respiratory:  Negative for cough, hemoptysis, sputum production, shortness of breath and wheezing.   Cardiovascular:  Negative for chest pain, palpitations, orthopnea and leg swelling.  Gastrointestinal:  Negative for abdominal pain, blood in stool, constipation, diarrhea, heartburn, melena, nausea and vomiting.  Genitourinary:  Negative for dysuria, frequency and urgency.  Musculoskeletal:  Negative for back  pain and joint pain.  Skin: Negative.  Negative for itching and rash.  Neurological:  Negative for dizziness, tingling, focal weakness, weakness and headaches.  Endo/Heme/Allergies:  Does not bruise/bleed easily.  Psychiatric/Behavioral:  Negative for depression. The patient is not nervous/anxious and does not have insomnia.     MEDICAL HISTORY:  Past Medical History:  Diagnosis Date   Anemia    Anxiety    Arthritis    Chronic kidney disease    Coronary artery disease    Depression    Dizziness    Dyspnea    Dysrhythmia    gets tachy if anxious or excited   GERD (gastroesophageal reflux disease)    History of hiatal hernia    HOH (hard of hearing)    Hypertension    Hypothyroidism    IDA (iron deficiency anemia) 12/14/2014   Myocardial infarction [facility]) 1992   Renal insufficiency    Sleep apnea    Tremors of nervous system     SURGICAL HISTORY: Past Surgical History:  Procedure Laterality Date   CATARACT EXTRACTION W/PHACO Left 07/15/2017   Procedure: CATARACT EXTRACTION PHACO AND INTRAOCULAR LENS PLACEMENT (IOC)-LEFT;  Surgeon: Birder Robson, MD;  Location: [facility] ORS;  Service: Ophthalmology;  Laterality: Left;  [place] 00.36.1 AP% 13.9 CDE 5.02 Fluid Pack lot # [id number]   CATARACT EXTRACTION W/PHACO Right 08/26/2017   Procedure: CATARACT EXTRACTION PHACO AND INTRAOCULAR LENS PLACEMENT (IOC);  Surgeon: Birder Robson, MD;  Location: [facility] ORS;  Service: Ophthalmology;  Laterality: Right;  [place] 00:41.0 AP% 15.3 CDE 6.27 FLUID PACK LOT # [id number]  CHOLECYSTECTOMY     COLONOSCOPY WITH PROPOFOL N/A 04/10/2015   Procedure: COLONOSCOPY WITH PROPOFOL;  Surgeon: Manya Silvas, MD;  Location: [facility] ENDOSCOPY;  Service: Endoscopy;  Laterality: N/A;   ESOPHAGOGASTRODUODENOSCOPY (EGD) WITH PROPOFOL N/A 04/10/2015   Procedure: ESOPHAGOGASTRODUODENOSCOPY (EGD) WITH PROPOFOL;  Surgeon: Manya Silvas, MD;  Location: [facility] ENDOSCOPY;  Service: Endoscopy;  Laterality: N/A;   fatty tumor  removed on left shoulder     gallbladdedr removed     JOINT REPLACEMENT Bilateral    total knee replacements   REPLACEMENT TOTAL KNEE BILATERAL Bilateral     SOCIAL HISTORY: Social History   Socioeconomic History   Marital status: Widowed    Spouse name: Not on file   Number of children: 9   Years of education: Not on file   Highest education level: 11th grade  Occupational History    Comment: retired  Tobacco Use   Smoking status: Never   Smokeless tobacco: Never  Vaping Use   Vaping Use: Never used  Substance and Sexual Activity   Alcohol use: No   Drug use: No   Sexual activity: Never  Other Topics Concern   Not on file  Social History Narrative   Not on file   Social Determinants of Health   Financial Resource Strain: Not on file  Food Insecurity: Not on file  Transportation Needs: Not on file  Physical Activity: Not on file  Stress: Not on file  Social Connections: Not on file  Intimate Partner Violence: Not on file    FAMILY HISTORY: Family History  Problem Relation Age of Onset   Arthritis Mother    Hypertension Mother    Stroke Mother    Hypertension Father    Heart attack Father    Breast cancer Neg Hx     ALLERGIES:  is allergic to penicillins, nsaids, rosuvastatin, atorvastatin, celebrex [celecoxib], felodipine, oxaprozin, rofecoxib, and simvastatin.  MEDICATIONS:  Current Outpatient Medications  Medication Sig Dispense Refill   acetaminophen (TYLENOL) 650 MG CR tablet Take 650 mg by mouth every 8 (eight) hours as needed for pain.     amLODipine (NORVASC) 10 MG tablet      amLODipine (NORVASC) 5 MG tablet Take 5 mg by mouth daily.     benazepril (LOTENSIN) 5 MG tablet Take 5 mg by mouth daily.     calcitRIOL (ROCALTROL) 0.25 MCG capsule Take 1 capsule by mouth daily.     calcitRIOL (ROCALTROL) 0.25 MCG capsule Take by mouth.     Cholecalciferol (VITAMIN D3) 2000 units capsule Take 4,000 Units by mouth daily.     cromolyn (OPTICROM) 4 %  ophthalmic solution SMARTSIG:In Eye(s)     docusate sodium (COLACE) 100 MG capsule Take 100 mg by mouth 2 (two) times daily.     esomeprazole (NEXIUM) 40 MG capsule Take 40 mg by mouth daily as needed (acid reflux).     Ferrous Sulfate 142 (45 Fe) MG TBCR Take 1 tablet by mouth 2 (two) times daily.      hydrALAZINE (APRESOLINE) 25 MG tablet      hydrALAZINE (APRESOLINE) 25 MG tablet Take 1 tablet by mouth 2 (two) times daily.     lactulose (CHRONULAC) 10 GM/15ML solution Take 10 g by mouth daily as needed for mild constipation.      loratadine (CLARITIN) 10 MG tablet Take 1 tablet (10 mg total) by mouth daily. 30 tablet 0   metoprolol succinate (TOPROL-XL) 50 MG 24 hr tablet Take 1 tablet (50 mg total)  by mouth daily. Take with or immediately following a meal. (Patient taking differently: Take 25 mg by mouth daily. Take with or immediately following a meal.) 30 tablet 0   mometasone (NASONEX) 50 MCG/ACT nasal spray Place 2 sprays into the nose daily as needed (allergies).     olmesartan (BENICAR) 20 MG tablet Take 20 mg by mouth daily.      olopatadine (PATANOL) 0.1 % ophthalmic solution Place 1 drop into both eyes 2 (two) times daily as needed for allergies.      polyethylene glycol (MIRALAX / GLYCOLAX) packet Take 17 g by mouth daily as needed for mild constipation or moderate constipation.     QUEtiapine (SEROQUEL) 50 MG tablet TAKE 1 TABLET BY MOUTH EVERY DAY AT NIGHT     risperiDONE (RISPERDAL) 0.25 MG tablet Take 1 tablet by mouth daily.     rivaroxaban (XARELTO) 20 MG TABS tablet Take 1 tablet (20 mg total) by mouth daily with supper. 30 tablet 0   senna-docusate (SENOKOT-S) 8.6-50 MG tablet Take 1 tablet by mouth 2 (two) times daily.     No current facility-administered medications for this visit.   Facility-Administered Medications Ordered in Other Visits  Medication Dose Route Frequency Provider Last Rate Last Admin   0.9 %  sodium chloride infusion   Intravenous Continuous Lequita Asal, MD   Stopped at 02/06/18 1429      .  PHYSICAL EXAMINATION: ECOG PERFORMANCE STATUS: 1 - Symptomatic but completely ambulatory  Vitals:   05/30/21 1051  BP: 125/84  Pulse: 62  Resp: 20  Temp: 98.5 F (36.9 C)   Filed Weights   05/30/21 1051  Weight: 242 lb 9.6 oz (110 kg)    Physical Exam Vitals and nursing note reviewed.  Constitutional:      Comments: Ambulating: in wheelchair  with family  HENT:     Head: Normocephalic and atraumatic.     Mouth/Throat:     Pharynx: Oropharynx is clear.  Eyes:     Extraocular Movements: Extraocular movements intact.     Pupils: Pupils are equal, round, and reactive to light.  Cardiovascular:     Rate and Rhythm: Normal rate and regular rhythm.  Pulmonary:     Comments: Decreased breath sounds bilaterally.  Abdominal:     Palpations: Abdomen is soft.  Musculoskeletal:        General: Normal range of motion.     Cervical back: Normal range of motion.  Skin:    General: Skin is warm.  Neurological:     General: No focal deficit present.     Mental Status: She is alert and oriented to person, place, and time.  Psychiatric:        Behavior: Behavior normal.        Judgment: Judgment normal.     LABORATORY DATA:  I have reviewed the data as listed Lab Results  Component Value Date   WBC 3.5 (L) 05/30/2021   HGB 12.1 05/30/2021   HCT 39.0 05/30/2021   MCV 88.0 05/30/2021   PLT 215 05/30/2021   Recent Labs    06/21/20 1111 10/18/20 1017 02/27/21 1122 05/30/21 1026  NA 140  --   --  138  K 4.1  --   --  3.9  CL 105  --   --  103  CO2 24  --   --  25  GLUCOSE 99  --   --  96  BUN 31*  --   --  32*  CREATININE 1.65* 1.84* 1.53* 1.72*  CALCIUM 9.3  --   --  9.1  GFRNONAA 30* 26* 33* 28*  PROT  --   --   --  7.4  ALBUMIN  --   --   --  3.9  AST  --   --   --  14*  ALT  --   --   --  10  ALKPHOS  --   --   --  63  BILITOT  --   --   --  0.5    RADIOGRAPHIC STUDIES: I have personally reviewed the  radiological images as listed and agreed with the findings in the report. No results found.  ASSESSMENT & PLAN:   Symptomatic anemia #  Iron deficiency anemia/ likley sec to CKD stage III/-today hemoglobin 12.  July, 2022- Iron sat -9%; Proceed with Venofer given symptomatic improvement post infusion. Continue PO iron BID.   # ? B12 deficiency: on PO supp.   #History of LE DVT on Xarelto-.  #CKD stage III-stable  DISPOSITION: # venofer today- # follow up in  6 months for MD and  labs (CBC with diff,cmp;iron studies/ ferritin]; possible venofer- Dr.B    All questions were answered. The patient knows to call the clinic with any problems, questions or concerns.       Cammie Sickle, MD 05/30/2021 11:24 AM

## 2021-05-30 NOTE — Assessment & Plan Note (Signed)
#    Iron deficiency anemia/ likley sec to CKD stage III/-today hemoglobin 12.  July, 2022- Iron sat -9%; Proceed with Venofer given symptomatic improvement post infusion. Continue PO iron BID.   # ? B12 deficiency: on PO supp.   #History of LE DVT on Xarelto-.  #CKD stage III-stable  DISPOSITION: # venofer today- # follow up in  6 months for MD and  labs (CBC with diff,cmp;iron studies/ ferritin]; possible venofer- Dr.B

## 2021-05-31 ENCOUNTER — Encounter (INDEPENDENT_AMBULATORY_CARE_PROVIDER_SITE_OTHER): Payer: Self-pay | Admitting: Vascular Surgery

## 2021-05-31 ENCOUNTER — Ambulatory Visit (INDEPENDENT_AMBULATORY_CARE_PROVIDER_SITE_OTHER): Payer: Medicare HMO | Admitting: Vascular Surgery

## 2021-05-31 ENCOUNTER — Ambulatory Visit (INDEPENDENT_AMBULATORY_CARE_PROVIDER_SITE_OTHER): Payer: Medicare HMO

## 2021-05-31 VITALS — BP 130/89 | HR 77 | Resp 16 | Wt 241.8 lb

## 2021-05-31 DIAGNOSIS — I10 Essential (primary) hypertension: Secondary | ICD-10-CM | POA: Diagnosis not present

## 2021-05-31 DIAGNOSIS — K219 Gastro-esophageal reflux disease without esophagitis: Secondary | ICD-10-CM | POA: Diagnosis not present

## 2021-05-31 DIAGNOSIS — I7141 Pararenal abdominal aortic aneurysm, without rupture: Secondary | ICD-10-CM

## 2021-05-31 DIAGNOSIS — I482 Chronic atrial fibrillation, unspecified: Secondary | ICD-10-CM

## 2021-05-31 DIAGNOSIS — N1832 Chronic kidney disease, stage 3b: Secondary | ICD-10-CM

## 2021-05-31 DIAGNOSIS — I872 Venous insufficiency (chronic) (peripheral): Secondary | ICD-10-CM

## 2021-05-31 DIAGNOSIS — I7133 Infrarenal abdominal aortic aneurysm, ruptured: Secondary | ICD-10-CM | POA: Diagnosis not present

## 2021-05-31 DIAGNOSIS — I7143 Infrarenal abdominal aortic aneurysm, without rupture: Secondary | ICD-10-CM

## 2021-05-31 NOTE — Progress Notes (Signed)
MRN : [id number]  Nicole Bishop is a 87 y.o. ([birth year]) female who presents with chief complaint of check up on AAA.  History of Present Illness:  The patient returns to the office for surveillance of a known abdominal aortic aneurysm. Patient denies abdominal pain or back pain, no other abdominal complaints. No changes suggesting embolic episodes.    There have been no interval changes in the patient's overall health care since his last visit.   Patient denies amaurosis fugax or TIA symptoms. There is no history of claudication or rest pain symptoms of the lower extremities. The patient denies angina or shortness of breath.    The patient has good blood pressure control at this time as well.   Duplex US of the aorta and iliac arteries shows an AAA measured 5.7 cm at the midportion.  Previous studies 05/25/2020 show measurement 4.5 cm.  There is evidence of mild abnormal dilatation of the right common and the left common iliac arteries.  Previous CT scan place the abdominal aortic measurement at 5.1 cm.  Current Meds  Medication Sig   acetaminophen (TYLENOL) 650 MG CR tablet Take 650 mg by mouth every 8 (eight) hours as needed for pain.   amLODipine (NORVASC) 5 MG tablet Take 5 mg by mouth daily.   benazepril (LOTENSIN) 5 MG tablet Take 5 mg by mouth daily.   calcitRIOL (ROCALTROL) 0.25 MCG capsule Take 1 capsule by mouth daily.   calcitRIOL (ROCALTROL) 0.25 MCG capsule Take by mouth.   Cholecalciferol (VITAMIN D3) 2000 units capsule Take 4,000 Units by mouth daily.   cromolyn (OPTICROM) 4 % ophthalmic solution SMARTSIG:In Eye(s)   docusate sodium (COLACE) 100 MG capsule Take 100 mg by mouth 2 (two) times daily.   esomeprazole (NEXIUM) 40 MG capsule Take 40 mg by mouth daily as needed (acid reflux).   Ferrous Sulfate 142 (45 Fe) MG TBCR Take 1 tablet by mouth 2 (two) times daily.    hydrALAZINE (APRESOLINE) 25 MG tablet    hydrALAZINE (APRESOLINE) 25 MG tablet Take 1 tablet by mouth  2 (two) times daily.   lactulose (CHRONULAC) 10 GM/15ML solution Take 10 g by mouth daily as needed for mild constipation.    loratadine (CLARITIN) 10 MG tablet Take 1 tablet (10 mg total) by mouth daily.   metoprolol succinate (TOPROL-XL) 50 MG 24 hr tablet Take 1 tablet (50 mg total) by mouth daily. Take with or immediately following a meal. (Patient taking differently: Take 25 mg by mouth daily. Take with or immediately following a meal.)   mometasone (NASONEX) 50 MCG/ACT nasal spray Place 2 sprays into the nose daily as needed (allergies).   olopatadine (PATANOL) 0.1 % ophthalmic solution Place 1 drop into both eyes 2 (two) times daily as needed for allergies.    polyethylene glycol (MIRALAX / GLYCOLAX) packet Take 17 g by mouth daily as needed for mild constipation or moderate constipation.   QUEtiapine (SEROQUEL) 50 MG tablet TAKE 1 TABLET BY MOUTH EVERY DAY AT NIGHT   risperiDONE (RISPERDAL) 0.25 MG tablet Take 1 tablet by mouth daily.   rivaroxaban (XARELTO) 20 MG TABS tablet Take 1 tablet (20 mg total) by mouth daily with supper.   senna-docusate (SENOKOT-S) 8.6-50 MG tablet Take 1 tablet by mouth 2 (two) times daily.    Past Medical History:  Diagnosis Date   Anemia    Anxiety    Arthritis    Chronic kidney disease    Coronary artery disease  Depression    Dizziness    Dyspnea    Dysrhythmia    gets tachy if anxious or excited   GERD (gastroesophageal reflux disease)    History of hiatal hernia    HOH (hard of hearing)    Hypertension    Hypothyroidism    IDA (iron deficiency anemia) 12/14/2014   Myocardial infarction [facility]) 1992   Renal insufficiency    Sleep apnea    Tremors of nervous system     Past Surgical History:  Procedure Laterality Date   CATARACT EXTRACTION W/PHACO Left 07/15/2017   Procedure: CATARACT EXTRACTION PHACO AND INTRAOCULAR LENS PLACEMENT (IOC)-LEFT;  Surgeon: Birder Robson, MD;  Location: [facility] ORS;  Service: Ophthalmology;  Laterality:  Left;  [place] 00.36.1 AP% 13.9 CDE 5.02 Fluid Pack lot # [id number]   CATARACT EXTRACTION W/PHACO Right 08/26/2017   Procedure: CATARACT EXTRACTION PHACO AND INTRAOCULAR LENS PLACEMENT (IOC);  Surgeon: Birder Robson, MD;  Location: [facility] ORS;  Service: Ophthalmology;  Laterality: Right;  [place] 00:41.0 AP% 15.3 CDE 6.27 FLUID PACK LOT # [id number]   CHOLECYSTECTOMY     COLONOSCOPY WITH PROPOFOL N/A 04/10/2015   Procedure: COLONOSCOPY WITH PROPOFOL;  Surgeon: Manya Silvas, MD;  [facility] ENDOSCOPY;  Service: Endoscopy;  Laterality: N/A;   ESOPHAGOGASTRODUODENOSCOPY (EGD) WITH PROPOFOL N/A 04/10/2015   Procedure: ESOPHAGOGASTRODUODENOSCOPY (EGD) WITH PROPOFOL;  Surgeon: Manya Silvas, MD;  Location: [facility] ENDOSCOPY;  Service: Endoscopy;  Laterality: N/A;   fatty tumor removed on left shoulder     gallbladdedr removed     JOINT REPLACEMENT Bilateral    total knee replacements   REPLACEMENT TOTAL KNEE BILATERAL Bilateral     Social History Social History   Tobacco Use   Smoking status: Never   Smokeless tobacco: Never  Vaping Use   Vaping Use: Never used  Substance Use Topics   Alcohol use: No   Drug use: No    Family History Family History  Problem Relation Age of Onset   Arthritis Mother    Hypertension Mother    Stroke Mother    Hypertension Father    Heart attack Father    Breast cancer Neg Hx     Allergies  Allergen Reactions   Penicillins Rash and Other (See Comments)    Has patient had a PCN reaction causing immediate rash, facial/tongue/throat swelling, SOB or lightheadedness with hypotension: Unknown Has patient had a PCN reaction causing severe rash involving mucus membranes or skin necrosis: Unknown Has patient had a PCN reaction that required hospitalization: Unknown Has patient had a PCN reaction occurring within the last 10 years: Unknown If all of the above answers are "NO", then may proceed with Cephalosporin use.    Nsaids Other (See Comments)    GI  upset   Rosuvastatin Other (See Comments)   Atorvastatin Rash   Celebrex [Celecoxib] Other (See Comments)    Constipation and stomach upset   Felodipine Nausea Only   Oxaprozin Nausea Only   Rofecoxib Other (See Comments)    GI upset   Simvastatin Rash     REVIEW OF SYSTEMS (Negative unless checked)  Constitutional: [] Weight loss  [] Fever  [] Chills Cardiac: [] Chest pain   [] Chest pressure   [] Palpitations   [] Shortness of breath when laying flat   [] Shortness of breath with exertion. Vascular:  [] Pain in legs with walking   [] Pain in legs at rest  [] History of DVT   [] Phlebitis   [x] Swelling in legs   [] Varicose veins   [] Non-healing ulcers Pulmonary:   []   Uses home oxygen   [] Productive cough   [] Hemoptysis   [] Wheeze  [] COPD   [] Asthma Neurologic:  [] Dizziness   [] Seizures   [] History of stroke   [] History of TIA  [] Aphasia   [] Vissual changes   [] Weakness or numbness in arm   [] Weakness or numbness in leg Musculoskeletal:   [] Joint swelling   [] Joint pain   [] Low back pain Hematologic:  [] Easy bruising  [] Easy bleeding   [] Hypercoagulable state   [] Anemic Gastrointestinal:  [] Diarrhea   [] Vomiting  [x] Gastroesophageal reflux/heartburn   [] Difficulty swallowing. Genitourinary:  [] Chronic kidney disease   [] Difficult urination  [] Frequent urination   [] Blood in urine Skin:  [] Rashes   [] Ulcers  Psychological:  [] History of anxiety   []  History of major depression.  Physical Examination  Vitals:   05/31/21 1034  BP: 130/89  Pulse: 77  Resp: 16  Weight: 241 lb 12.8 oz (109.7 kg)   Body mass index is 32.79 kg/m. Gen: WD/WN, NAD Head: [place]/AT, No temporalis wasting.  Ear/Nose/Throat: Hearing grossly intact, nares w/o erythema or drainage Eyes: PER, EOMI, sclera nonicteric.  Neck: Supple, no masses.  No bruit or JVD.  Pulmonary:  Good air movement, no audible wheezing, no use of accessory muscles.  Cardiac: RRR, normal S1, S2, no Murmurs. Vascular:   Vessel Right Left  Radial  Palpable Palpable  Gastrointestinal: soft, non-distended. No guarding/no peritoneal signs.  Musculoskeletal: M/S 5/5 throughout.  No visible deformity.  Neurologic: CN 2-12 intact. Pain and light touch intact in extremities.  Symmetrical.  Speech is fluent. Motor exam as listed above. Psychiatric: Judgment intact, Mood & affect appropriate for pt's clinical situation. Dermatologic: No rashes or ulcers noted.  No changes consistent with cellulitis.   CBC Lab Results  Component Value Date   WBC 3.5 (L) 05/30/2021   HGB 12.1 05/30/2021   HCT 39.0 05/30/2021   MCV 88.0 05/30/2021   PLT 215 05/30/2021    BMET    Component Value Date/Time   NA 138 05/30/2021 1026   NA 135 11/21/2014 1301   K 3.9 05/30/2021 1026   K 4.1 11/21/2014 1301   CL 103 05/30/2021 1026   CL 105 11/21/2014 1301   CO2 25 05/30/2021 1026   CO2 23 11/21/2014 1301   GLUCOSE 96 05/30/2021 1026   GLUCOSE 102 (H) 11/21/2014 1301   BUN 32 (H) 05/30/2021 1026   BUN 15 11/21/2014 1301   CREATININE 1.72 (H) 05/30/2021 1026   CREATININE 1.08 (H) 11/21/2014 1301   CALCIUM 9.1 05/30/2021 1026   CALCIUM 9.0 11/21/2014 1301   GFRNONAA 28 (L) 05/30/2021 1026   GFRNONAA 48 (L) 11/21/2014 1301   GFRAA 29 (L) 02/28/2020 1425   GFRAA 56 (L) 11/21/2014 1301   Estimated Creatinine Clearance: 31.3 mL/min (A) (by C-G formula based on SCr of 1.72 mg/dL (H)).  COAG Lab Results  Component Value Date   INR 1.20 06/09/2018   INR 1.1 11/21/2014    Radiology No results found.   Assessment/Plan 1. Infrarenal abdominal aortic aneurysm (AAA) without rupture Recommend: The patient has an abdominal aortic aneurysm that is >5.0 cm by duplex scan and based on this study it appears that it is suitable for endovascular treatment.  The patient is otherwise in reasonable health.   Therefore, the patient should undergo endovascular repair of the AAA to prevent future leathal rupture.   Patient will require CT angiography of the  abdomen and pelvis in order to appropriately plan repair of the AAA.  The risks  and benefits as well as the alternative therapies was discussed in detail with the patient. All questions were answered. The patient agrees to move forward the AAA repair and therefore with CT scan.  The patient will follow up with me in the office after the CT scan to review the study.   - CT ABDOMEN PELVIS WO CONTRAST; Future  2. Stage 3b chronic kidney disease ([place]) At the present time the patient not on dialysis.  Avoid nephrotoxic medications and dehydration. So I will obtain the CT without IV contrast.  Further plans per nephrology   3. Essential (primary) hypertension Continue antihypertensive medications as already ordered, these medications have been reviewed and there are no changes at this time.   4. Chronic atrial fibrillation (HCC) Continue antiarrhythmia medications as already ordered, these medications have been reviewed and there are no changes at this time.  Continue anticoagulation as ordered by Cardiology Service   5. Chronic venous insufficiency No surgery or intervention at this point in time.  I have reviewed my discussion with the patient regarding venous insufficiency and why it causes symptoms. I have discussed with the patient the chronic skin changes that accompany venous insufficiency and the long term sequela such as ulceration. Patient will contnue wearing graduated compression stockings on a daily basis, as this has provided excellent control of his edema. The patient will put the stockings on first thing in the morning and removing them in the evening. The patient is reminded not to sleep in the stockings.  In addition, behavioral modification including elevation during the day will be initiated.  6. Gastroesophageal reflux disease without esophagitis Continue PPI as already ordered, this medication has been reviewed and there are no changes at this time.  Avoidence of  caffeine and alcohol  Moderate elevation of the head of the bed       Hortencia Pilar, MD  05/31/2021 12:59 PM

## 2021-06-10 ENCOUNTER — Emergency Department: Payer: Medicare HMO

## 2021-06-10 ENCOUNTER — Encounter: Payer: Self-pay | Admitting: *Deleted

## 2021-06-10 ENCOUNTER — Other Ambulatory Visit: Payer: Self-pay

## 2021-06-10 DIAGNOSIS — I130 Hypertensive heart and chronic kidney disease with heart failure and stage 1 through stage 4 chronic kidney disease, or unspecified chronic kidney disease: Secondary | ICD-10-CM | POA: Insufficient documentation

## 2021-06-10 DIAGNOSIS — Z96653 Presence of artificial knee joint, bilateral: Secondary | ICD-10-CM | POA: Diagnosis not present

## 2021-06-10 DIAGNOSIS — N183 Chronic kidney disease, stage 3 unspecified: Secondary | ICD-10-CM | POA: Insufficient documentation

## 2021-06-10 DIAGNOSIS — Z7901 Long term (current) use of anticoagulants: Secondary | ICD-10-CM | POA: Insufficient documentation

## 2021-06-10 DIAGNOSIS — W1839XA Other fall on same level, initial encounter: Secondary | ICD-10-CM | POA: Diagnosis not present

## 2021-06-10 DIAGNOSIS — M25562 Pain in left knee: Secondary | ICD-10-CM | POA: Diagnosis not present

## 2021-06-10 DIAGNOSIS — E039 Hypothyroidism, unspecified: Secondary | ICD-10-CM | POA: Diagnosis not present

## 2021-06-10 DIAGNOSIS — F0390 Unspecified dementia without behavioral disturbance: Secondary | ICD-10-CM | POA: Diagnosis not present

## 2021-06-10 DIAGNOSIS — Z79899 Other long term (current) drug therapy: Secondary | ICD-10-CM | POA: Insufficient documentation

## 2021-06-10 DIAGNOSIS — I5032 Chronic diastolic (congestive) heart failure: Secondary | ICD-10-CM | POA: Insufficient documentation

## 2021-06-10 DIAGNOSIS — I2510 Atherosclerotic heart disease of native coronary artery without angina pectoris: Secondary | ICD-10-CM | POA: Insufficient documentation

## 2021-06-10 DIAGNOSIS — S8992XA Unspecified injury of left lower leg, initial encounter: Secondary | ICD-10-CM | POA: Diagnosis not present

## 2021-06-10 DIAGNOSIS — S80212A Abrasion, left knee, initial encounter: Secondary | ICD-10-CM | POA: Diagnosis not present

## 2021-06-10 NOTE — ED Triage Notes (Signed)
Pt to ED after falling in such a way that her left leg was pinned in the bed frame. Pt had to have leg cut out by the fire department. PT has redness, bruising and an abrasion to the left knee with a small amount of pain. Pt has been able to bear weight since the fall but has a knee replacement and was concerned.

## 2021-06-11 ENCOUNTER — Emergency Department
Admission: EM | Admit: 2021-06-11 | Discharge: 2021-06-11 | Disposition: A | Discharge status: Home / Self Care | Payer: Medicare HMO | Attending: Emergency Medicine | Admitting: Emergency Medicine

## 2021-06-11 DIAGNOSIS — M25562 Pain in left knee: Secondary | ICD-10-CM

## 2021-06-11 MED ORDER — ACETAMINOPHEN 500 MG PO TABS
1000.0000 mg | ORAL_TABLET | Freq: Once | ORAL | Status: AC
  Administered 2021-06-11: 1000 mg via ORAL
  Filled 2021-06-11: qty 2

## 2021-06-11 NOTE — ED Notes (Signed)
Pt able to ambulate in room independently with walker sans complications.

## 2021-06-11 NOTE — ED Notes (Addendum)
l °

## 2021-06-11 NOTE — Discharge Instructions (Signed)
Use Tylenol for pain and fevers.  Up to 1000 mg per dose, up to 4 times per day.  Do not take more than 4000 mg of Tylenol/acetaminophen within 24 hours..  

## 2021-06-11 NOTE — ED Provider Notes (Signed)
[facility] Emergency Department Provider Note ____________________________________________   Event Date/Time   First MD Initiated Contact with Patient 06/11/21 0217     (approximate)  I have reviewed the triage vital signs and the nursing notes.  HISTORY  Chief Complaint Knee Injury   HPI Nicole Bishop is a 87 y.o. femalewho presents to the ED for evaluation of left knee injury.   Chart review indicates below hx.  Patient lives at home alone, daughter lives nearby and helps check in on her.  Patient is ambulatory with a walker typically.  Patient presents to the ED for evaluation of an accidental injury to her left knee.  She reports her leg slipping and getting caught between her bed and the wall and having difficulty getting out of this situation.  EMS was called and had to pull her out from between these.  She reports pain to her left knee that is since resolved without intervention.  Reports feeling fine now and has no complaints.  Reports history of left knee replacement in the past and was to get checked out due to this hardware.   Past Medical History:  Diagnosis Date   Anemia    Anxiety    Arthritis    Chronic kidney disease    Coronary artery disease    Depression    Dizziness    Dyspnea    Dysrhythmia    gets tachy if anxious or excited   GERD (gastroesophageal reflux disease)    History of hiatal hernia    HOH (hard of hearing)    Hypertension    Hypothyroidism    IDA (iron deficiency anemia) 12/14/2014   Myocardial infarction [facility]) 1992   Renal insufficiency    Sleep apnea    Tremors of nervous system     Patient Active Problem List   Diagnosis Date Noted   Right renal artery stenosis ([place]) 03/01/2020   Weight loss 09/27/2019   Dementia, vascular, with delusions 07/27/2019   Abdominal aortic aneurysm (AAA) without rupture 05/24/2019   Anemia in chronic kidney disease 05/06/2019   Edema of lower extremity 05/06/2019    Hyperparathyroidism due to renal insufficiency ([place]) 05/05/2019   A-fib ([place]) 11/02/2018   Chronic venous insufficiency 06/23/2018   Lymphedema 06/23/2018   DVT (deep venous thrombosis) ([place]) 06/09/2018   Major depressive disorder, recurrent, mild ([place]) 05/06/2018   Abnormal gait 12/03/2017   Chronic diastolic CHF (congestive heart failure), NYHA class 3 ([place]) 10/29/2017   Mild dementia 09/29/2017   Sleep behavior disorder, REM 09/29/2017   Visual hallucinations 07/16/2017   SOB (shortness of breath) on exertion 10/30/2016   Depression, major, single episode, complete remission ([place]) 08/03/2016   Renal failure 03/17/2015   Chronic kidney disease (CKD), stage III (moderate) (HCC) 12/22/2014   Anemia, iron deficiency 12/22/2014   IDA (iron deficiency anemia) 12/14/2014   Chronic constipation 06/03/2014   Essential (primary) hypertension 05/03/2014   Acid reflux 04/20/2014   H/O: obesity 04/20/2014   Airway hyperreactivity 04/20/2014   Arthritis of shoulder region, degenerative 01/27/2014   B12 deficiency 12/27/2013   Dizziness 03/13/2012   Back ache 11/04/2011   Avitaminosis D 11/04/2011   Mixed hyperlipidemia 10/31/2011   Symptomatic anemia 10/31/2011   Depression 05/21/2011   Hearing loss 05/21/2011    Past Surgical History:  Procedure Laterality Date   CATARACT EXTRACTION W/PHACO Left 07/15/2017   Procedure: CATARACT EXTRACTION PHACO AND INTRAOCULAR LENS PLACEMENT (IOC)-LEFT;  Surgeon: Birder Robson, MD;  Location: [facility] ORS;  Service: Ophthalmology;  Laterality: Left;  [place] 00.36.1 AP% 13.9 CDE 5.02 Fluid Pack lot # [id number]   CATARACT EXTRACTION W/PHACO Right 08/26/2017   Procedure: CATARACT EXTRACTION PHACO AND INTRAOCULAR LENS PLACEMENT (IOC);  Surgeon: Birder Robson, MD;  Location: [facility] ORS;  Service: Ophthalmology;  Laterality: Right;  [place] 00:41.0 AP% 15.3 CDE 6.27 FLUID PACK LOT # [id number]   CHOLECYSTECTOMY     COLONOSCOPY WITH PROPOFOL N/A 04/10/2015    Procedure: COLONOSCOPY WITH PROPOFOL;  Surgeon: Manya Silvas, MD;  Location: [facility] ENDOSCOPY;  Service: Endoscopy;  Laterality: N/A;   ESOPHAGOGASTRODUODENOSCOPY (EGD) WITH PROPOFOL N/A 04/10/2015   Procedure: ESOPHAGOGASTRODUODENOSCOPY (EGD) WITH PROPOFOL;  Surgeon: Manya Silvas, MD;  Location: [facility] ENDOSCOPY;  Service: Endoscopy;  Laterality: N/A;   fatty tumor removed on left shoulder     gallbladdedr removed     JOINT REPLACEMENT Bilateral    total knee replacements   REPLACEMENT TOTAL KNEE BILATERAL Bilateral     Prior to Admission medications   Medication Sig Start Date End Date Taking? Authorizing Provider  acetaminophen (TYLENOL) 650 MG CR tablet Take 650 mg by mouth every 8 (eight) hours as needed for pain.    [provider]  amLODipine (NORVASC) 10 MG tablet  12/28/20   [provider]  amLODipine (NORVASC) 5 MG tablet Take 5 mg by mouth daily.    [provider]  benazepril (LOTENSIN) 5 MG tablet Take 5 mg by mouth daily.    [provider]  calcitRIOL (ROCALTROL) 0.25 MCG capsule Take 1 capsule by mouth daily. 09/25/17   [provider]  calcitRIOL (ROCALTROL) 0.25 MCG capsule Take by mouth. 12/29/20   [provider]  Cholecalciferol (VITAMIN D3) 2000 units capsule Take 4,000 Units by mouth daily.    [provider]  cromolyn (OPTICROM) 4 % ophthalmic solution SMARTSIG:In Eye(s) 01/12/20   [provider]  docusate sodium (COLACE) 100 MG capsule Take 100 mg by mouth 2 (two) times daily. 06/03/14   [provider]  esomeprazole (NEXIUM) 40 MG capsule Take 40 mg by mouth daily as needed (acid reflux).    [provider]  Ferrous Sulfate 142 (45 Fe) MG TBCR Take 1 tablet by mouth 2 (two) times daily.     [provider]  hydrALAZINE (APRESOLINE) 25 MG tablet  04/05/20   [provider]  hydrALAZINE (APRESOLINE) 25 MG tablet Take 1 tablet by mouth 2 (two) times daily. 02/23/21    [provider]  lactulose (CHRONULAC) 10 GM/15ML solution Take 10 g by mouth daily as needed for mild constipation.  04/01/16   [provider]  loratadine (CLARITIN) 10 MG tablet Take 1 tablet (10 mg total) by mouth daily. 02/12/13   Jackolyn Confer, MD  metoprolol succinate (TOPROL-XL) 50 MG 24 hr tablet Take 1 tablet (50 mg total) by mouth daily. Take with or immediately following a meal. Patient taking differently: Take 25 mg by mouth daily. Take with or immediately following a meal. 06/12/18   Mody, Sital, MD  mometasone (NASONEX) 50 MCG/ACT nasal spray Place 2 sprays into the nose daily as needed (allergies).    [provider]  olmesartan (BENICAR) 20 MG tablet Take 20 mg by mouth daily.  09/09/19 05/30/21  [provider]  olopatadine (PATANOL) 0.1 % ophthalmic solution Place 1 drop into both eyes 2 (two) times daily as needed for allergies.     [provider]  polyethylene glycol (MIRALAX / GLYCOLAX) packet Take 17 g by  mouth daily as needed for mild constipation or moderate constipation.    [provider]  QUEtiapine (SEROQUEL) 50 MG tablet TAKE 1 TABLET BY MOUTH EVERY DAY AT NIGHT 12/20/20   [provider]  risperiDONE (RISPERDAL) 0.25 MG tablet Take 1 tablet by mouth daily. 07/27/19   [provider]  rivaroxaban (XARELTO) 20 MG TABS tablet Take 1 tablet (20 mg total) by mouth daily with supper. 06/30/18   Bettey Costa, MD  senna-docusate (SENOKOT-S) 8.6-50 MG tablet Take 1 tablet by mouth 2 (two) times daily. 06/15/20   [provider]    Allergies Penicillins, Nsaids, Rosuvastatin, Atorvastatin, Celebrex [celecoxib], Felodipine, Oxaprozin, Rofecoxib, and Simvastatin  Family History  Problem Relation Age of Onset   Arthritis Mother    Hypertension Mother    Stroke Mother    Hypertension Father    Heart attack Father    Breast cancer Neg Hx     Social History Social History   Tobacco Use    Smoking status: Never   Smokeless tobacco: Never  Vaping Use   Vaping Use: Never used  Substance Use Topics   Alcohol use: No   Drug use: No    Review of Systems  Constitutional: No fever/chills Eyes: No visual changes. ENT: No sore throat. Cardiovascular: Denies chest pain. Respiratory: Denies shortness of breath. Gastrointestinal: No abdominal pain.  No nausea, no vomiting.  No diarrhea.  No constipation. Genitourinary: Negative for dysuria. Musculoskeletal: Negative for back pain. Positive for left knee pain.  Skin: Negative for rash. Neurological: Negative for headaches, focal weakness or numbness. ____________________________________________   PHYSICAL EXAM:  VITAL SIGNS: Vitals:   06/10/21 2016 06/11/21 0042  BP: 100/71 127/86  Pulse: 71 76  Resp: 16 15  Temp: 98.4 F (36.9 C)   SpO2: 95% 97%    Constitutional: Alert and oriented. Well appearing and in no acute distress. Eyes: Conjunctivae are normal. PERRL. EOMI. Head: Atraumatic. Nose: No congestion/rhinnorhea. Mouth/Throat: Mucous membranes are moist.  Oropharynx non-erythematous. Neck: No stridor. No cervical spine tenderness to palpation. Cardiovascular: Normal rate, regular rhythm. Grossly normal heart sounds.  Good peripheral circulation. Respiratory: Normal respiratory effort.  No retractions. Lungs CTAB. Gastrointestinal: Soft , nondistended, nontender to palpation. No CVA tenderness. Musculoskeletal: No lower extremity tenderness nor edema.  No joint effusions.  Well-healed midline surgical incisions to bilateral knees.  Horizontal superficial abrasion to the superior aspect of the left knee.  No laceration or evidence of open injury.  No significant tenderness throughout the knee joint.  Full active and passive ROM. Neurologic:  Normal speech and language. No gross focal neurologic deficits are appreciated. No gait instability noted. Skin:  Skin is warm, dry and intact. No rash noted. Psychiatric:  Mood and affect are normal. Speech and behavior are normal. ____________________________________________   LABS (all labs ordered are listed, but only abnormal results are displayed)  Labs Reviewed - No data to display ____________________________________________  12 Lead EKG   ____________________________________________  RADIOLOGY  ED MD interpretation: Plain film of the knee reviewed by me without evidence of fracture or dislocation.  Hardware in place.  Official radiology report(s): DG Knee Complete 4 Views Left  Result Date: 06/10/2021 CLINICAL DATA:  Fall with injury EXAM: LEFT KNEE - COMPLETE 4+ VIEW COMPARISON:  06/08/2018 FINDINGS: Prior left knee replacement. No acute bony abnormality. Specifically, no fracture, subluxation, or dislocation. No joint effusion. IMPRESSION: Prior left knee replacement.  No acute bony abnormality. Electronically Signed   By: Rolm Baptise M.D.  On: 06/10/2021 20:42    ____________________________________________   PROCEDURES and INTERVENTIONS  Procedure(s) performed (including Critical Care):  Procedures  Medications  acetaminophen (TYLENOL) tablet 1,000 mg (1,000 mg Oral Given 06/11/21 0242)    ____________________________________________   MDM / ED COURSE   87 year old woman presents to the ED with mechanical knee injury, without evidence of acute pathology, and amenable to return home.  She looks clinically well and has a benign examination.  Abrasion noted to the quadriceps tendon just superior to her patella, but otherwise no evidence of acute trauma.  No tenderness.  No evidence of laceration or open injury.  Full active and passive ROM.  No signs of neurologic vascular deficits or additional trauma.  Ambulatory at her baseline with a walker.  We will discharge with return precautions.     ____________________________________________   FINAL CLINICAL IMPRESSION(S) / ED DIAGNOSES  Final diagnoses:  Acute pain of left  knee     ED Discharge Orders     None        Kaire Stary   Note:  This document was prepared using Dragon voice recognition software and may include unintentional dictation errors.    Vladimir Crofts, MD 06/11/21 [phone]

## 2021-06-14 DIAGNOSIS — I890 Lymphedema, not elsewhere classified: Secondary | ICD-10-CM | POA: Diagnosis not present

## 2021-06-14 DIAGNOSIS — N1832 Chronic kidney disease, stage 3b: Secondary | ICD-10-CM | POA: Diagnosis not present

## 2021-06-14 DIAGNOSIS — E782 Mixed hyperlipidemia: Secondary | ICD-10-CM | POA: Diagnosis not present

## 2021-06-14 DIAGNOSIS — I872 Venous insufficiency (chronic) (peripheral): Secondary | ICD-10-CM | POA: Diagnosis not present

## 2021-06-14 DIAGNOSIS — I5032 Chronic diastolic (congestive) heart failure: Secondary | ICD-10-CM | POA: Diagnosis not present

## 2021-06-14 DIAGNOSIS — R0602 Shortness of breath: Secondary | ICD-10-CM | POA: Diagnosis not present

## 2021-06-14 DIAGNOSIS — I10 Essential (primary) hypertension: Secondary | ICD-10-CM | POA: Diagnosis not present

## 2021-07-31 DIAGNOSIS — Z79899 Other long term (current) drug therapy: Secondary | ICD-10-CM | POA: Diagnosis not present

## 2021-07-31 DIAGNOSIS — D649 Anemia, unspecified: Secondary | ICD-10-CM | POA: Diagnosis not present

## 2021-07-31 DIAGNOSIS — I700 Atherosclerosis of aorta: Secondary | ICD-10-CM | POA: Diagnosis not present

## 2021-07-31 DIAGNOSIS — Z0000 Encounter for general adult medical examination without abnormal findings: Secondary | ICD-10-CM | POA: Diagnosis not present

## 2021-07-31 DIAGNOSIS — N1832 Chronic kidney disease, stage 3b: Secondary | ICD-10-CM | POA: Diagnosis not present

## 2021-07-31 DIAGNOSIS — R7309 Other abnormal glucose: Secondary | ICD-10-CM | POA: Diagnosis not present

## 2021-07-31 DIAGNOSIS — F330 Major depressive disorder, recurrent, mild: Secondary | ICD-10-CM | POA: Diagnosis not present

## 2021-07-31 DIAGNOSIS — R441 Visual hallucinations: Secondary | ICD-10-CM | POA: Diagnosis not present

## 2021-07-31 DIAGNOSIS — R42 Dizziness and giddiness: Secondary | ICD-10-CM | POA: Diagnosis not present

## 2021-08-02 DIAGNOSIS — N1832 Chronic kidney disease, stage 3b: Secondary | ICD-10-CM | POA: Diagnosis not present

## 2021-08-02 DIAGNOSIS — R300 Dysuria: Secondary | ICD-10-CM | POA: Diagnosis not present

## 2021-08-02 DIAGNOSIS — D649 Anemia, unspecified: Secondary | ICD-10-CM | POA: Diagnosis not present

## 2021-08-02 DIAGNOSIS — R42 Dizziness and giddiness: Secondary | ICD-10-CM | POA: Diagnosis not present

## 2021-08-02 DIAGNOSIS — I700 Atherosclerosis of aorta: Secondary | ICD-10-CM | POA: Diagnosis not present

## 2021-08-02 DIAGNOSIS — R441 Visual hallucinations: Secondary | ICD-10-CM | POA: Diagnosis not present

## 2021-08-02 DIAGNOSIS — F330 Major depressive disorder, recurrent, mild: Secondary | ICD-10-CM | POA: Diagnosis not present

## 2021-08-02 DIAGNOSIS — Z0000 Encounter for general adult medical examination without abnormal findings: Secondary | ICD-10-CM | POA: Diagnosis not present

## 2021-08-02 DIAGNOSIS — R7309 Other abnormal glucose: Secondary | ICD-10-CM | POA: Diagnosis not present

## 2021-08-22 ENCOUNTER — Other Ambulatory Visit: Payer: Self-pay

## 2021-08-22 DIAGNOSIS — M19031 Primary osteoarthritis, right wrist: Secondary | ICD-10-CM | POA: Diagnosis not present

## 2021-08-22 DIAGNOSIS — M25552 Pain in left hip: Secondary | ICD-10-CM | POA: Diagnosis not present

## 2021-08-22 DIAGNOSIS — I129 Hypertensive chronic kidney disease with stage 1 through stage 4 chronic kidney disease, or unspecified chronic kidney disease: Secondary | ICD-10-CM | POA: Insufficient documentation

## 2021-08-22 DIAGNOSIS — M25462 Effusion, left knee: Secondary | ICD-10-CM | POA: Diagnosis not present

## 2021-08-22 DIAGNOSIS — N189 Chronic kidney disease, unspecified: Secondary | ICD-10-CM | POA: Diagnosis not present

## 2021-08-22 DIAGNOSIS — I2510 Atherosclerotic heart disease of native coronary artery without angina pectoris: Secondary | ICD-10-CM | POA: Insufficient documentation

## 2021-08-22 DIAGNOSIS — E039 Hypothyroidism, unspecified: Secondary | ICD-10-CM | POA: Diagnosis not present

## 2021-08-22 DIAGNOSIS — R9431 Abnormal electrocardiogram [ECG] [EKG]: Secondary | ICD-10-CM | POA: Diagnosis not present

## 2021-08-22 DIAGNOSIS — W19XXXA Unspecified fall, initial encounter: Secondary | ICD-10-CM | POA: Diagnosis not present

## 2021-08-22 DIAGNOSIS — W1839XA Other fall on same level, initial encounter: Secondary | ICD-10-CM | POA: Insufficient documentation

## 2021-08-22 DIAGNOSIS — Z043 Encounter for examination and observation following other accident: Secondary | ICD-10-CM | POA: Diagnosis not present

## 2021-08-22 DIAGNOSIS — R55 Syncope and collapse: Secondary | ICD-10-CM | POA: Diagnosis not present

## 2021-08-22 DIAGNOSIS — R42 Dizziness and giddiness: Secondary | ICD-10-CM | POA: Insufficient documentation

## 2021-08-22 DIAGNOSIS — S99912A Unspecified injury of left ankle, initial encounter: Secondary | ICD-10-CM | POA: Diagnosis not present

## 2021-08-22 DIAGNOSIS — S0990XA Unspecified injury of head, initial encounter: Secondary | ICD-10-CM | POA: Diagnosis not present

## 2021-08-22 DIAGNOSIS — S8992XA Unspecified injury of left lower leg, initial encounter: Secondary | ICD-10-CM | POA: Diagnosis present

## 2021-08-22 DIAGNOSIS — R52 Pain, unspecified: Secondary | ICD-10-CM | POA: Diagnosis not present

## 2021-08-22 DIAGNOSIS — S8002XA Contusion of left knee, initial encounter: Secondary | ICD-10-CM | POA: Diagnosis not present

## 2021-08-22 DIAGNOSIS — R600 Localized edema: Secondary | ICD-10-CM | POA: Diagnosis not present

## 2021-08-22 DIAGNOSIS — I517 Cardiomegaly: Secondary | ICD-10-CM | POA: Diagnosis not present

## 2021-08-22 DIAGNOSIS — M7989 Other specified soft tissue disorders: Secondary | ICD-10-CM | POA: Diagnosis not present

## 2021-08-22 DIAGNOSIS — S4991XA Unspecified injury of right shoulder and upper arm, initial encounter: Secondary | ICD-10-CM | POA: Diagnosis not present

## 2021-08-22 DIAGNOSIS — M79641 Pain in right hand: Secondary | ICD-10-CM | POA: Diagnosis not present

## 2021-08-22 DIAGNOSIS — S8012XA Contusion of left lower leg, initial encounter: Secondary | ICD-10-CM | POA: Diagnosis not present

## 2021-08-22 NOTE — ED Triage Notes (Addendum)
Pt to ED via [facility] from [place].  Per EMS someone heard pt yelling "help" and called 911.  When they arrived, pt was lying on floor in bathroom with left leg up against wall.  Pt c/o right arm pain and left leg pain.  Pt states everything went Nicole Bishop and when she came too, the room was spinning. Pt A&Ox4 on arrival to ED, NAD noted.  EMS VS: Cbg=152 125/85 98% RA

## 2021-08-23 ENCOUNTER — Emergency Department: Payer: Medicare HMO

## 2021-08-23 ENCOUNTER — Emergency Department
Admission: EM | Admit: 2021-08-23 | Discharge: 2021-08-23 | Disposition: A | Discharge status: Home / Self Care | Payer: Medicare HMO | Attending: Emergency Medicine | Admitting: Emergency Medicine

## 2021-08-23 DIAGNOSIS — S4991XA Unspecified injury of right shoulder and upper arm, initial encounter: Secondary | ICD-10-CM | POA: Diagnosis not present

## 2021-08-23 DIAGNOSIS — M7989 Other specified soft tissue disorders: Secondary | ICD-10-CM | POA: Diagnosis not present

## 2021-08-23 DIAGNOSIS — R42 Dizziness and giddiness: Secondary | ICD-10-CM

## 2021-08-23 DIAGNOSIS — R55 Syncope and collapse: Secondary | ICD-10-CM

## 2021-08-23 DIAGNOSIS — T07XXXA Unspecified multiple injuries, initial encounter: Secondary | ICD-10-CM

## 2021-08-23 DIAGNOSIS — M79641 Pain in right hand: Secondary | ICD-10-CM | POA: Diagnosis not present

## 2021-08-23 DIAGNOSIS — M25552 Pain in left hip: Secondary | ICD-10-CM | POA: Diagnosis not present

## 2021-08-23 DIAGNOSIS — S99912A Unspecified injury of left ankle, initial encounter: Secondary | ICD-10-CM | POA: Diagnosis not present

## 2021-08-23 DIAGNOSIS — M19031 Primary osteoarthritis, right wrist: Secondary | ICD-10-CM | POA: Diagnosis not present

## 2021-08-23 DIAGNOSIS — I517 Cardiomegaly: Secondary | ICD-10-CM | POA: Diagnosis not present

## 2021-08-23 DIAGNOSIS — M25462 Effusion, left knee: Secondary | ICD-10-CM | POA: Diagnosis not present

## 2021-08-23 DIAGNOSIS — S8002XA Contusion of left knee, initial encounter: Secondary | ICD-10-CM | POA: Diagnosis not present

## 2021-08-23 DIAGNOSIS — R600 Localized edema: Secondary | ICD-10-CM | POA: Diagnosis not present

## 2021-08-23 DIAGNOSIS — Z043 Encounter for examination and observation following other accident: Secondary | ICD-10-CM | POA: Diagnosis not present

## 2021-08-23 LAB — CBC
HCT: 35.2 % — ABNORMAL LOW (ref 36.0–46.0)
Hemoglobin: 11.1 g/dL — ABNORMAL LOW (ref 12.0–15.0)
MCH: 28.5 pg (ref 26.0–34.0)
MCHC: 31.5 g/dL (ref 30.0–36.0)
MCV: 90.3 fL (ref 80.0–100.0)
Platelets: 176 10*3/uL (ref 150–400)
RBC: 3.9 MIL/uL (ref 3.87–5.11)
RDW: 14.8 % (ref 11.5–15.5)
WBC: 4.6 10*3/uL (ref 4.0–10.5)
nRBC: 0 % (ref 0.0–0.2)

## 2021-08-23 LAB — URINALYSIS, ROUTINE W REFLEX MICROSCOPIC
Bacteria, UA: NONE SEEN
Bilirubin Urine: NEGATIVE
Glucose, UA: NEGATIVE mg/dL
Hgb urine dipstick: NEGATIVE
Ketones, ur: NEGATIVE mg/dL
Nitrite: NEGATIVE
Protein, ur: NEGATIVE mg/dL
Specific Gravity, Urine: 1.013 (ref 1.005–1.030)
pH: 5 (ref 5.0–8.0)

## 2021-08-23 LAB — BASIC METABOLIC PANEL
Anion gap: 9 (ref 5–15)
BUN: 26 mg/dL — ABNORMAL HIGH (ref 8–23)
CO2: 20 mmol/L — ABNORMAL LOW (ref 22–32)
Calcium: 8.9 mg/dL (ref 8.9–10.3)
Chloride: 107 mmol/L (ref 98–111)
Creatinine, Ser: 1.55 mg/dL — ABNORMAL HIGH (ref 0.44–1.00)
GFR, Estimated: 32 mL/min — ABNORMAL LOW (ref >60)
Glucose, Bld: 113 mg/dL — ABNORMAL HIGH (ref 70–99)
Potassium: 3.4 mmol/L — ABNORMAL LOW (ref 3.5–5.1)
Sodium: 136 mmol/L (ref 135–145)

## 2021-08-23 LAB — TROPONIN I (HIGH SENSITIVITY): Troponin I (High Sensitivity): 8 ng/L (ref <18)

## 2021-08-23 NOTE — Discharge Instructions (Addendum)
Your lab tests, xrays, and CT scan of the head were all okay today. Please follow up with your doctor for further monitoring of your symptoms.

## 2021-08-23 NOTE — ED Notes (Signed)
Pt was 2 person assist to get from chair to toilet and back to chair

## 2021-08-23 NOTE — ED Provider Notes (Signed)
[facility] Provider Note    Event Date/Time   First MD Initiated Contact with Patient 08/23/21 [phone]     (approximate)   History   Fall   HPI  Nicole Bishop is a 88 y.o. female with a past history of CAD, CKD, GERD, hypertension who was brought to the ED today after a fall.  She reports being in her usual state of health yesterday at her independent living center, finished eating a meal and taking her blood pressure medicine, and then went back to her room.  She went to the bathroom to change close, and started to feel lightheaded.  She attempted to walk back to her chair in her room, but felt too dizzy to make it.  Not sure if she passed out.  She complains of pain around the left knee, denies any other complaints at this time.  Denies any worrisome prodromal symptoms such as chest pain shortness of breath abdominal pain back pain or sudden headache.  No paresthesias motor weakness or vision changes.    Past Medical History:  Diagnosis Date   Anemia    Anxiety    Arthritis    Chronic kidney disease    Coronary artery disease    Depression    Dizziness    Dyspnea    Dysrhythmia    gets tachy if anxious or excited   GERD (gastroesophageal reflux disease)    History of hiatal hernia    HOH (hard of hearing)    Hypertension    Hypothyroidism    IDA (iron deficiency anemia) 12/14/2014   Myocardial infarction [facility]) 1992   Renal insufficiency    Sleep apnea    Tremors of nervous system      Physical Exam   Triage Vital Signs: ED Triage Vitals  Enc Vitals Group     BP 08/22/21 2350 123/85     Pulse Rate 08/22/21 2350 68     Resp 08/22/21 2350 18     Temp 08/22/21 2350 97.8 F (36.6 C)     Temp Source 08/22/21 2350 Oral     SpO2 08/22/21 2350 95 %     Weight 08/22/21 2356 230 lb (104.3 kg)     Height 08/22/21 2356 6' (1.829 m)     Head Circumference --      Peak Flow --      Pain Score --      Pain Loc --      Pain Edu? --      Excl.  in [place]? --     Most recent vital signs: Vitals:   08/23/21 0700 08/23/21 1100  BP: (!) 147/108 (!) 150/104  Pulse: (!) 131 80  Resp: 20 14  Temp:  98 F (36.7 C)  SpO2: 91% 94%     General: Awake, no distress.  CV:  Good peripheral perfusion.  Regular rate and rhythm. Resp:  Normal effort.  No wheezing or crackles Abd:  No distention.  Other:  No bony point tenderness.  Intact range of motion.  Knee is stable.  No lower extremity edema.  No midline spinal tenderness.  Head is atraumatic.   ED Results / Procedures / Treatments   Labs (all labs ordered are listed, but only abnormal results are displayed) Labs Reviewed  BASIC METABOLIC PANEL - Abnormal; Notable for the following components:      Result Value   Potassium 3.4 (*)    CO2 20 (*)    Glucose, Bld  113 (*)    BUN 26 (*)    Creatinine, Ser 1.55 (*)    GFR, Estimated 32 (*)    All other components within normal limits  CBC - Abnormal; Notable for the following components:   Hemoglobin 11.1 (*)    HCT 35.2 (*)    All other components within normal limits  URINALYSIS, ROUTINE W REFLEX MICROSCOPIC - Abnormal; Notable for the following components:   Color, Urine YELLOW (*)    APPearance CLEAR (*)    Leukocytes,Ua TRACE (*)    All other components within normal limits  CBG MONITORING, ED  TROPONIN I (HIGH SENSITIVITY)     EKG  Interpreted by me Atrial fibrillation, rate of 78.  Normal axis and intervals.  Normal QRS ST segments and T waves.  No ischemic changes.   RADIOLOGY CT head viewed and interpreted by me, no obvious ICH.  Radiology report reviewed.  X-ray of left hip knee and tibia-fibula reviewed and interpreted by me, no fractures.  Radiology reports reviewed as well.    PROCEDURES:  Critical Care performed: No  Procedures   MEDICATIONS ORDERED IN ED: Medications - No data to display   IMPRESSION / MDM / [place] / ED COURSE  I reviewed the triage vital signs and the nursing  notes.                              Differential diagnosis includes, but is not limited to, intracranial hemorrhage, long bone fracture, anemia, dehydration, electrolyte abnormality, UTI   Patient presents with dizziness, most likely orthostatic symptoms.  Chemistry panel unremarkable with creatinine at baseline CKD level.  Sodium and potassium are essentially normal.  CBC shows normal hemoglobin, no leukocytosis.  Imaging reviewed as above.  No acute traumatic findings such as fracture or intracranial hemorrhage.  Awaiting urinalysis after which patient will be suitable for discharge back home with antibiotics as needed.  Doubt ACS PE dissection aneurysm stroke or sepsis.  Clinical Course as of 08/23/21 1216  Thu Aug 23, 2021  1215 UA negative. [PS]    Clinical Course User Index [PS] Carrie Mew, MD     FINAL CLINICAL IMPRESSION(S) / ED DIAGNOSES   Final diagnoses:  Dizziness  Multiple contusions     Rx / DC Orders   ED Discharge Orders     None        Note:  This document was prepared using Dragon voice recognition software and may include unintentional dictation errors.   Carrie Mew, MD 08/23/21 1217

## 2021-08-23 NOTE — ED Notes (Signed)
See triage note  presents s/p fall  states she got caught in walker   fell having pain to left leg  states she is not able to walk

## 2021-08-27 DIAGNOSIS — F0152 Vascular dementia, unspecified severity, with psychotic disturbance: Secondary | ICD-10-CM | POA: Diagnosis not present

## 2021-08-27 DIAGNOSIS — I5032 Chronic diastolic (congestive) heart failure: Secondary | ICD-10-CM | POA: Diagnosis not present

## 2021-08-27 DIAGNOSIS — I4891 Unspecified atrial fibrillation: Secondary | ICD-10-CM | POA: Diagnosis not present

## 2021-08-27 DIAGNOSIS — N1832 Chronic kidney disease, stage 3b: Secondary | ICD-10-CM | POA: Diagnosis not present

## 2021-08-27 DIAGNOSIS — I130 Hypertensive heart and chronic kidney disease with heart failure and stage 1 through stage 4 chronic kidney disease, or unspecified chronic kidney disease: Secondary | ICD-10-CM | POA: Diagnosis not present

## 2021-08-27 DIAGNOSIS — R42 Dizziness and giddiness: Secondary | ICD-10-CM | POA: Diagnosis not present

## 2021-08-27 DIAGNOSIS — F330 Major depressive disorder, recurrent, mild: Secondary | ICD-10-CM | POA: Diagnosis not present

## 2021-08-27 DIAGNOSIS — Z09 Encounter for follow-up examination after completed treatment for conditions other than malignant neoplasm: Secondary | ICD-10-CM | POA: Diagnosis not present

## 2021-09-20 ENCOUNTER — Encounter (HOSPITAL_COMMUNITY): Payer: Self-pay | Admitting: Radiology

## 2021-09-24 ENCOUNTER — Other Ambulatory Visit: Payer: Self-pay | Admitting: Internal Medicine

## 2021-09-24 DIAGNOSIS — Z1231 Encounter for screening mammogram for malignant neoplasm of breast: Secondary | ICD-10-CM

## 2021-10-30 ENCOUNTER — Other Ambulatory Visit: Payer: Self-pay

## 2021-10-30 ENCOUNTER — Ambulatory Visit
Admission: RE | Admit: 2021-10-30 | Discharge: 2021-10-30 | Disposition: A | Discharge status: Home / Self Care | Payer: Medicare HMO | Source: Ambulatory Visit | Attending: Internal Medicine | Admitting: Internal Medicine

## 2021-10-30 DIAGNOSIS — Z1231 Encounter for screening mammogram for malignant neoplasm of breast: Secondary | ICD-10-CM | POA: Insufficient documentation

## 2021-11-28 ENCOUNTER — Inpatient Hospital Stay: Payer: Medicare HMO

## 2021-11-28 ENCOUNTER — Inpatient Hospital Stay: Payer: Medicare HMO | Attending: Internal Medicine

## 2021-11-28 ENCOUNTER — Inpatient Hospital Stay (HOSPITAL_BASED_OUTPATIENT_CLINIC_OR_DEPARTMENT_OTHER): Payer: Medicare HMO | Admitting: Medical Oncology

## 2021-11-28 VITALS — BP 109/80 | HR 70 | Temp 98.2°F | Resp 18 | Ht 72.0 in | Wt 234.5 lb

## 2021-11-28 DIAGNOSIS — I252 Old myocardial infarction: Secondary | ICD-10-CM | POA: Diagnosis not present

## 2021-11-28 DIAGNOSIS — D631 Anemia in chronic kidney disease: Secondary | ICD-10-CM | POA: Insufficient documentation

## 2021-11-28 DIAGNOSIS — Z7951 Long term (current) use of inhaled steroids: Secondary | ICD-10-CM | POA: Insufficient documentation

## 2021-11-28 DIAGNOSIS — R5383 Other fatigue: Secondary | ICD-10-CM | POA: Insufficient documentation

## 2021-11-28 DIAGNOSIS — N1832 Chronic kidney disease, stage 3b: Secondary | ICD-10-CM | POA: Diagnosis not present

## 2021-11-28 DIAGNOSIS — E538 Deficiency of other specified B group vitamins: Secondary | ICD-10-CM

## 2021-11-28 DIAGNOSIS — G4730 Sleep apnea, unspecified: Secondary | ICD-10-CM | POA: Insufficient documentation

## 2021-11-28 DIAGNOSIS — D649 Anemia, unspecified: Secondary | ICD-10-CM

## 2021-11-28 DIAGNOSIS — I129 Hypertensive chronic kidney disease with stage 1 through stage 4 chronic kidney disease, or unspecified chronic kidney disease: Secondary | ICD-10-CM | POA: Diagnosis not present

## 2021-11-28 DIAGNOSIS — D509 Iron deficiency anemia, unspecified: Secondary | ICD-10-CM | POA: Insufficient documentation

## 2021-11-28 DIAGNOSIS — Z86718 Personal history of other venous thrombosis and embolism: Secondary | ICD-10-CM | POA: Insufficient documentation

## 2021-11-28 DIAGNOSIS — Z7901 Long term (current) use of anticoagulants: Secondary | ICD-10-CM | POA: Insufficient documentation

## 2021-11-28 DIAGNOSIS — Z79899 Other long term (current) drug therapy: Secondary | ICD-10-CM | POA: Insufficient documentation

## 2021-11-28 DIAGNOSIS — F03A3 Unspecified dementia, mild, with mood disturbance: Secondary | ICD-10-CM | POA: Insufficient documentation

## 2021-11-28 DIAGNOSIS — F03A0 Unspecified dementia, mild, without behavioral disturbance, psychotic disturbance, mood disturbance, and anxiety: Secondary | ICD-10-CM

## 2021-11-28 LAB — CBC WITH DIFFERENTIAL/PLATELET
Abs Immature Granulocytes: 0.02 10*3/uL (ref 0.00–0.07)
Basophils Absolute: 0.1 10*3/uL (ref 0.0–0.1)
Basophils Relative: 1 %
Eosinophils Absolute: 0 10*3/uL (ref 0.0–0.5)
Eosinophils Relative: 0 %
HCT: 43.2 % (ref 36.0–46.0)
Hemoglobin: 13.7 g/dL (ref 12.0–15.0)
Immature Granulocytes: 0 %
Lymphocytes Relative: 32 %
Lymphs Abs: 1.8 10*3/uL (ref 0.7–4.0)
MCH: 27.7 pg (ref 26.0–34.0)
MCHC: 31.7 g/dL (ref 30.0–36.0)
MCV: 87.3 fL (ref 80.0–100.0)
Monocytes Absolute: 0.3 10*3/uL (ref 0.1–1.0)
Monocytes Relative: 6 %
Neutro Abs: 3.4 10*3/uL (ref 1.7–7.7)
Neutrophils Relative %: 61 %
Platelets: 267 10*3/uL (ref 150–400)
RBC: 4.95 MIL/uL (ref 3.87–5.11)
RDW: 15 % (ref 11.5–15.5)
WBC: 5.6 10*3/uL (ref 4.0–10.5)
nRBC: 0 % (ref 0.0–0.2)

## 2021-11-28 LAB — COMPREHENSIVE METABOLIC PANEL
ALT: 11 U/L (ref 0–44)
AST: 20 U/L (ref 15–41)
Albumin: 3.8 g/dL (ref 3.5–5.0)
Alkaline Phosphatase: 72 U/L (ref 38–126)
Anion gap: 13 (ref 5–15)
BUN: 32 mg/dL — ABNORMAL HIGH (ref 8–23)
CO2: 22 mmol/L (ref 22–32)
Calcium: 9.8 mg/dL (ref 8.9–10.3)
Chloride: 101 mmol/L (ref 98–111)
Creatinine, Ser: 1.83 mg/dL — ABNORMAL HIGH (ref 0.44–1.00)
GFR, Estimated: 26 mL/min — ABNORMAL LOW (ref >60)
Glucose, Bld: 93 mg/dL (ref 70–99)
Potassium: 3.7 mmol/L (ref 3.5–5.1)
Sodium: 136 mmol/L (ref 135–145)
Total Bilirubin: 0.8 mg/dL (ref 0.3–1.2)
Total Protein: 7.8 g/dL (ref 6.5–8.1)

## 2021-11-28 LAB — IRON AND TIBC
Iron: 76 ug/dL (ref 28–170)
Saturation Ratios: 26 % (ref 10.4–31.8)
TIBC: 294 ug/dL (ref 250–450)
UIBC: 218 ug/dL

## 2021-11-28 LAB — FERRITIN: Ferritin: 191 ng/mL (ref 11–307)

## 2021-11-28 NOTE — Progress Notes (Addendum)
[facility] ?CONSULT NOTE ? ?Patient Care Team: ?Tracie Harrier, MD as PCP - General (Internal Medicine) ?Manya Silvas, MD (Inactive) (Gastroenterology) ?Ok Edwards, NP as Nurse Practitioner (Gastroenterology) ?Anthonette Legato, MD (Internal Medicine) ?Marval Regal, NP as Nurse Practitioner (Nurse Practitioner) ? ?CHIEF COMPLAINTS/PURPOSE OF CONSULTATION:  ?Iron deficiency anemia ? ?#Iron deficiency anemia/CKD stage III-on Venofer ? ?# CKD- III [[facility]] ? ?  ?Oncology History  ? No history exists.  ? ?Results for Nicole Bishop (MRN [id number]) as of 05/30/2021 11:08 ? Ref. Range 10/18/2020 10:17 11/23/2020 09:25 12/19/2020 11:26 02/27/2021 11:22 05/30/2021 10:26  ?Iron Latest Ref Range: 28 - 170 ug/dL 48  108 37   ?UIBC Latest Units: ug/dL 285  288 369   ?TIBC Latest Ref Range: 250 - 450 ug/dL 333  396 406   ?Saturation Ratios Latest Ref Range: 10.4 - 31.8 % '14  27 9 '$ (L)   ?Ferritin Latest Ref Range: 11 - 307 ng/mL 85  19 23   ? ? ?HISTORY OF PRESENTING ILLNESS: alone; in a wheel chair. ? ?Nicole Bishop 88 y.o.  female with mild iron deficiency anemia secondary to CKD-III is here for follow-up.  She presents alongside her daughter Nicole Bishop who is caring for her until her other daughter lives back in about a month. ? ?Today they state that patient has been struggling with fatigue and worsening dementia over the past few months.  Her mood has worsened over the past month as well as per her daughter she has become very irritable and "hateful".  Daughter states that she was a former Emergency planning/management officer and that she has been managing her mom's medications however her mom is not taking her medications at certain times due to apprehensions about being given the medications by the daughter.  They state that they are working with her primary care provider regarding her fatigue, depression and dementia. ? ?In terms of her iron deficiency they have not noticed any bleeding  episodes.  She is taking the p.o. iron most days. No nausea no abdominal pain. ? ? ?Review of Systems  ?Constitutional:  Positive for malaise/fatigue. Negative for chills, diaphoresis, fever and weight loss.  ?HENT:  Negative for nosebleeds and sore throat.   ?Eyes:  Negative for double vision.  ?Respiratory:  Negative for cough, hemoptysis, sputum production, shortness of breath and wheezing.   ?Cardiovascular:  Negative for chest pain, palpitations, orthopnea and leg swelling.  ?Gastrointestinal:  Negative for abdominal pain, blood in stool, constipation, diarrhea, heartburn, melena, nausea and vomiting.  ?Genitourinary:  Negative for dysuria, frequency and urgency.  ?Musculoskeletal:  Negative for back pain and joint pain.  ?Skin: Negative.  Negative for itching and rash.  ?Neurological:  Negative for dizziness, tingling, focal weakness, weakness and headaches.  ?Endo/Heme/Allergies:  Does not bruise/bleed easily.  ?Psychiatric/Behavioral:  Negative for depression. The patient is not nervous/anxious and does not have insomnia.    ? ?MEDICAL HISTORY:  ?Past Medical History:  ?Diagnosis Date  ? Anemia   ? Anxiety   ? Arthritis   ? Chronic kidney disease   ? Coronary artery disease   ? Depression   ? Dizziness   ? Dyspnea   ? Dysrhythmia   ? gets tachy if anxious or excited  ? GERD (gastroesophageal reflux disease)   ? History of hiatal hernia   ? HOH (hard of hearing)   ? Hypertension   ? Hypothyroidism   ? IDA (iron deficiency anemia) 12/14/2014  ? Myocardial infarction (  [place]) 1992  ? Renal insufficiency   ? Sleep apnea   ? Tremors of nervous system   ? ? ?SURGICAL HISTORY: ?Past Surgical History:  ?Procedure Laterality Date  ? CATARACT EXTRACTION W/PHACO Left 07/15/2017  ? Procedure: CATARACT EXTRACTION PHACO AND INTRAOCULAR LENS PLACEMENT (IOC)-LEFT;  Surgeon: Birder Robson, MD;  Location: [facility] ORS;  Service: Ophthalmology;  Laterality: Left;  [place] 00.36.1 ?AP% 13.9 ?CDE 5.02 ?Fluid Pack lot # [id number]  ?  CATARACT EXTRACTION W/PHACO Right 08/26/2017  ? Procedure: CATARACT EXTRACTION PHACO AND INTRAOCULAR LENS PLACEMENT (IOC);  Surgeon: Birder Robson, MD;  Location: [facility] ORS;  Service: Ophthalmology;  Laterality: Right;  [place] 00:41.0 ?AP% 15.3 ?CDE 6.27 ?FLUID PACK LOT # [id number]  ? CHOLECYSTECTOMY    ? COLONOSCOPY WITH PROPOFOL N/A 04/10/2015  ? Procedure: COLONOSCOPY WITH PROPOFOL;  Surgeon: Manya Silvas, MD;  Location: [facility] ENDOSCOPY;  Service: Endoscopy;  Laterality: N/A;  ? ESOPHAGOGASTRODUODENOSCOPY (EGD) WITH PROPOFOL N/A 04/10/2015  ? Procedure: ESOPHAGOGASTRODUODENOSCOPY (EGD) WITH PROPOFOL;  Surgeon: Manya Silvas, MD;  Location: [facility] END[facility]COPY;  Service: Endoscopy;  Laterality: N/A;  ? fatty tumor removed on left shoulder    ? gallbladdedr removed    ? JOINT REPLACEMENT Bilateral   ? total knee replacements  ? REPLACEMENT TOTAL KNEE BILATERAL Bilateral   ? ? ?SOCIAL HISTORY: ?Social History  ? ?Socioeconomic History  ? Marital status: Widowed  ?  Spouse name: Not on file  ? Number of children: 9  ? Years of education: Not on file  ? Highest education level: 11th grade  ?Occupational History  ?  Comment: retired  ?Tobacco Use  ? Smoking status: Never  ? Smokeless tobacco: Never  ?Vaping Use  ? Vaping Use: Never used  ?Substance and Sexual Activity  ? Alcohol use: No  ? Drug use: No  ? Sexual activity: Never  ?Other Topics Concern  ? Not on file  ?Social History Narrative  ? Not on file  ? ?Social Determinants of Health  ? ?Financial Resource Strain: Not on file  ?Food Insecurity: Not on file  ?Transportation Needs: Not on file  ?Physical Activity: Not on file  ?Stress: Not on file  ?Social Connections: Not on file  ?Intimate Partner Violence: Not on file  ? ? ?FAMILY HISTORY: ?Family History  ?Problem Relation Age of Onset  ? Arthritis Mother   ? Hypertension Mother   ? Stroke Mother   ? Hypertension Father   ? Heart attack Father   ? Breast cancer Neg Hx   ? ? ?ALLERGIES:  is allergic to penicillins,  nsaids, rosuvastatin, atorvastatin, celebrex [celecoxib], felodipine, oxaprozin, rofecoxib, and simvastatin. ? ?MEDICATIONS:  ?Current Outpatient Medications  ?Medication Sig Dispense Refill  ? acetaminophen (TYLENOL) 650 MG CR tablet Take 650 mg by mouth every 8 (eight) hours as needed for pain.    ? amLODipine (NORVASC) 10 MG tablet     ? benazepril (LOTENSIN) 5 MG tablet Take 5 mg by mouth daily.    ? calcitRIOL (ROCALTROL) 0.25 MCG capsule Take 1 capsule by mouth daily.    ? chlorthalidone (HYGROTON) 25 MG tablet Take by mouth.    ? Cholecalciferol (VITAMIN D3) 2000 units capsule Take 4,000 Units by mouth daily.    ? cromolyn (OPTICROM) 4 % ophthalmic solution SMARTSIG:In Eye(s)    ? docusate sodium (COLACE) 100 MG capsule Take 100 mg by mouth 2 (two) times daily.    ? esomeprazole (NEXIUM) 40 MG capsule Take 40 mg by mouth daily as needed (acid  reflux).    ? Ferrous Sulfate 142 (45 Fe) MG TBCR Take 1 tablet by mouth 2 (two) times daily.     ? hydrALAZINE (APRESOLINE) 25 MG tablet Take 1 tablet by mouth 2 (two) times daily.    ? lactulose (CHRONULAC) 10 GM/15ML solution Take 10 g by mouth daily as needed for mild constipation.     ? loratadine (CLARITIN) 10 MG tablet Take 1 tablet (10 mg total) by mouth daily. 30 tablet 0  ? metoprolol succinate (TOPROL-XL) 50 MG 24 hr tablet Take 1 tablet (50 mg total) by mouth daily. Take with or immediately following a meal. (Patient taking differently: Take 25 mg by mouth daily. Take with or immediately following a meal.) 30 tablet 0  ? mometasone (NASONEX) 50 MCG/ACT nasal spray Place 2 sprays into the nose daily as needed (allergies).    ? olopatadine (PATANOL) 0.1 % ophthalmic solution Place 1 drop into both eyes 2 (two) times daily as needed for allergies.     ? polyethylene glycol (MIRALAX / GLYCOLAX) packet Take 17 g by mouth daily as needed for mild constipation or moderate constipation.    ? potassium chloride (KLOR-CON) 10 MEQ tablet Take by mouth.    ? QUEtiapine  (SEROQUEL) 50 MG tablet TAKE 1 TABLET BY MOUTH EVERY DAY AT NIGHT    ? risperiDONE (RISPERDAL) 0.25 MG tablet Take 1 tablet by mouth daily.    ? rivaroxaban (XARELTO) 20 MG TABS tablet Take 1 tablet (20 mg total) b

## 2021-11-28 NOTE — Progress Notes (Signed)
Pt and family reports extreme fatigue x 1 month unrelieved by rest. Pt reports dizziness. Pt has some dementia per Daughter. ?

## 2021-12-04 ENCOUNTER — Telehealth: Payer: Self-pay | Admitting: Medical Oncology

## 2021-12-04 DIAGNOSIS — N1832 Chronic kidney disease, stage 3b: Secondary | ICD-10-CM | POA: Diagnosis not present

## 2021-12-04 DIAGNOSIS — R413 Other amnesia: Secondary | ICD-10-CM | POA: Diagnosis not present

## 2021-12-04 DIAGNOSIS — E782 Mixed hyperlipidemia: Secondary | ICD-10-CM | POA: Diagnosis not present

## 2021-12-04 DIAGNOSIS — R410 Disorientation, unspecified: Secondary | ICD-10-CM | POA: Diagnosis not present

## 2021-12-04 DIAGNOSIS — R0602 Shortness of breath: Secondary | ICD-10-CM | POA: Diagnosis not present

## 2021-12-04 DIAGNOSIS — I10 Essential (primary) hypertension: Secondary | ICD-10-CM | POA: Diagnosis not present

## 2021-12-04 DIAGNOSIS — I5032 Chronic diastolic (congestive) heart failure: Secondary | ICD-10-CM | POA: Diagnosis not present

## 2021-12-04 NOTE — Telephone Encounter (Signed)
Left voicemail for daughter Cloyd Stagers  ?

## 2021-12-05 ENCOUNTER — Other Ambulatory Visit: Payer: Self-pay | Admitting: Family Medicine

## 2021-12-05 DIAGNOSIS — R410 Disorientation, unspecified: Secondary | ICD-10-CM | POA: Diagnosis not present

## 2021-12-05 DIAGNOSIS — R413 Other amnesia: Secondary | ICD-10-CM

## 2021-12-06 ENCOUNTER — Encounter: Payer: Self-pay | Admitting: Licensed Clinical Social Worker

## 2021-12-06 NOTE — Progress Notes (Signed)
[facility] CSW Progress Note ? ?Clinical Social Worker contacted caregiver by phone to discuss referral. The patient's daughter Nicole Bishop,Nicole Bishop (Daughter) [phone] (Mobile) stated she was unable to talk because she in at a doctor's appointment.  Ms. Assunta Found stated she would call CSW when she was available. ? ? ? ?Gurpreet Mikhail , LCSW ?

## 2021-12-15 ENCOUNTER — Other Ambulatory Visit: Payer: Self-pay | Admitting: Family Medicine

## 2021-12-15 DIAGNOSIS — R413 Other amnesia: Secondary | ICD-10-CM

## 2021-12-15 DIAGNOSIS — R410 Disorientation, unspecified: Secondary | ICD-10-CM

## 2021-12-27 DIAGNOSIS — I10 Essential (primary) hypertension: Secondary | ICD-10-CM | POA: Diagnosis not present

## 2021-12-27 DIAGNOSIS — R600 Localized edema: Secondary | ICD-10-CM | POA: Diagnosis not present

## 2021-12-27 DIAGNOSIS — I701 Atherosclerosis of renal artery: Secondary | ICD-10-CM | POA: Diagnosis not present

## 2021-12-27 DIAGNOSIS — N184 Chronic kidney disease, stage 4 (severe): Secondary | ICD-10-CM | POA: Diagnosis not present

## 2021-12-27 DIAGNOSIS — N2581 Secondary hyperparathyroidism of renal origin: Secondary | ICD-10-CM | POA: Diagnosis not present

## 2021-12-27 DIAGNOSIS — D631 Anemia in chronic kidney disease: Secondary | ICD-10-CM | POA: Diagnosis not present

## 2021-12-31 DIAGNOSIS — R262 Difficulty in walking, not elsewhere classified: Secondary | ICD-10-CM | POA: Diagnosis not present

## 2021-12-31 DIAGNOSIS — I5032 Chronic diastolic (congestive) heart failure: Secondary | ICD-10-CM | POA: Diagnosis not present

## 2021-12-31 DIAGNOSIS — F32A Depression, unspecified: Secondary | ICD-10-CM | POA: Diagnosis not present

## 2021-12-31 DIAGNOSIS — I480 Paroxysmal atrial fibrillation: Secondary | ICD-10-CM | POA: Diagnosis not present

## 2021-12-31 DIAGNOSIS — R531 Weakness: Secondary | ICD-10-CM | POA: Diagnosis not present

## 2021-12-31 DIAGNOSIS — I701 Atherosclerosis of renal artery: Secondary | ICD-10-CM | POA: Diagnosis not present

## 2021-12-31 DIAGNOSIS — F03A2 Unspecified dementia, mild, with psychotic disturbance: Secondary | ICD-10-CM | POA: Diagnosis not present

## 2021-12-31 DIAGNOSIS — Z0000 Encounter for general adult medical examination without abnormal findings: Secondary | ICD-10-CM | POA: Diagnosis not present

## 2021-12-31 DIAGNOSIS — I130 Hypertensive heart and chronic kidney disease with heart failure and stage 1 through stage 4 chronic kidney disease, or unspecified chronic kidney disease: Secondary | ICD-10-CM | POA: Diagnosis not present

## 2021-12-31 DIAGNOSIS — N183 Chronic kidney disease, stage 3 unspecified: Secondary | ICD-10-CM | POA: Diagnosis not present

## 2022-01-15 DIAGNOSIS — M791 Myalgia, unspecified site: Secondary | ICD-10-CM | POA: Diagnosis not present

## 2022-01-15 DIAGNOSIS — F0152 Vascular dementia, unspecified severity, with psychotic disturbance: Secondary | ICD-10-CM | POA: Diagnosis not present

## 2022-01-15 DIAGNOSIS — I130 Hypertensive heart and chronic kidney disease with heart failure and stage 1 through stage 4 chronic kidney disease, or unspecified chronic kidney disease: Secondary | ICD-10-CM | POA: Diagnosis not present

## 2022-01-15 DIAGNOSIS — N2581 Secondary hyperparathyroidism of renal origin: Secondary | ICD-10-CM | POA: Diagnosis not present

## 2022-01-15 DIAGNOSIS — D631 Anemia in chronic kidney disease: Secondary | ICD-10-CM | POA: Diagnosis not present

## 2022-01-15 DIAGNOSIS — N1832 Chronic kidney disease, stage 3b: Secondary | ICD-10-CM | POA: Diagnosis not present

## 2022-01-15 DIAGNOSIS — I5032 Chronic diastolic (congestive) heart failure: Secondary | ICD-10-CM | POA: Diagnosis not present

## 2022-01-24 DIAGNOSIS — Z780 Asymptomatic menopausal state: Secondary | ICD-10-CM | POA: Diagnosis not present

## 2022-02-08 DIAGNOSIS — B351 Tinea unguium: Secondary | ICD-10-CM | POA: Diagnosis not present

## 2022-02-08 DIAGNOSIS — M79674 Pain in right toe(s): Secondary | ICD-10-CM | POA: Diagnosis not present

## 2022-02-08 DIAGNOSIS — M79675 Pain in left toe(s): Secondary | ICD-10-CM | POA: Diagnosis not present

## 2022-04-03 DIAGNOSIS — I701 Atherosclerosis of renal artery: Secondary | ICD-10-CM | POA: Diagnosis not present

## 2022-04-03 DIAGNOSIS — I10 Essential (primary) hypertension: Secondary | ICD-10-CM | POA: Diagnosis not present

## 2022-04-03 DIAGNOSIS — N2581 Secondary hyperparathyroidism of renal origin: Secondary | ICD-10-CM | POA: Diagnosis not present

## 2022-04-03 DIAGNOSIS — N184 Chronic kidney disease, stage 4 (severe): Secondary | ICD-10-CM | POA: Diagnosis not present

## 2022-04-03 DIAGNOSIS — D631 Anemia in chronic kidney disease: Secondary | ICD-10-CM | POA: Diagnosis not present

## 2022-04-04 DIAGNOSIS — R0602 Shortness of breath: Secondary | ICD-10-CM | POA: Diagnosis not present

## 2022-04-04 DIAGNOSIS — I872 Venous insufficiency (chronic) (peripheral): Secondary | ICD-10-CM | POA: Diagnosis not present

## 2022-04-04 DIAGNOSIS — I5032 Chronic diastolic (congestive) heart failure: Secondary | ICD-10-CM | POA: Diagnosis not present

## 2022-04-04 DIAGNOSIS — I10 Essential (primary) hypertension: Secondary | ICD-10-CM | POA: Diagnosis not present

## 2022-04-04 DIAGNOSIS — I890 Lymphedema, not elsewhere classified: Secondary | ICD-10-CM | POA: Diagnosis not present

## 2022-04-04 DIAGNOSIS — E782 Mixed hyperlipidemia: Secondary | ICD-10-CM | POA: Diagnosis not present

## 2022-05-09 DIAGNOSIS — F0392 Unspecified dementia, unspecified severity, with psychotic disturbance: Secondary | ICD-10-CM | POA: Diagnosis not present

## 2022-05-09 DIAGNOSIS — Z0000 Encounter for general adult medical examination without abnormal findings: Secondary | ICD-10-CM | POA: Diagnosis not present

## 2022-05-09 DIAGNOSIS — Z23 Encounter for immunization: Secondary | ICD-10-CM | POA: Diagnosis not present

## 2022-05-09 DIAGNOSIS — D649 Anemia, unspecified: Secondary | ICD-10-CM | POA: Diagnosis not present

## 2022-05-09 DIAGNOSIS — I130 Hypertensive heart and chronic kidney disease with heart failure and stage 1 through stage 4 chronic kidney disease, or unspecified chronic kidney disease: Secondary | ICD-10-CM | POA: Diagnosis not present

## 2022-05-09 DIAGNOSIS — M19012 Primary osteoarthritis, left shoulder: Secondary | ICD-10-CM | POA: Diagnosis not present

## 2022-05-09 DIAGNOSIS — I5030 Unspecified diastolic (congestive) heart failure: Secondary | ICD-10-CM | POA: Diagnosis not present

## 2022-05-09 DIAGNOSIS — E782 Mixed hyperlipidemia: Secondary | ICD-10-CM | POA: Diagnosis not present

## 2022-05-09 DIAGNOSIS — R7309 Other abnormal glucose: Secondary | ICD-10-CM | POA: Diagnosis not present

## 2022-05-30 ENCOUNTER — Other Ambulatory Visit: Payer: Self-pay

## 2022-05-30 DIAGNOSIS — E538 Deficiency of other specified B group vitamins: Secondary | ICD-10-CM

## 2022-05-31 ENCOUNTER — Inpatient Hospital Stay (HOSPITAL_BASED_OUTPATIENT_CLINIC_OR_DEPARTMENT_OTHER): Payer: Medicare HMO | Admitting: Internal Medicine

## 2022-05-31 ENCOUNTER — Inpatient Hospital Stay: Payer: Medicare HMO | Attending: Internal Medicine

## 2022-05-31 DIAGNOSIS — D631 Anemia in chronic kidney disease: Secondary | ICD-10-CM | POA: Insufficient documentation

## 2022-05-31 DIAGNOSIS — E039 Hypothyroidism, unspecified: Secondary | ICD-10-CM | POA: Insufficient documentation

## 2022-05-31 DIAGNOSIS — Z86718 Personal history of other venous thrombosis and embolism: Secondary | ICD-10-CM | POA: Diagnosis not present

## 2022-05-31 DIAGNOSIS — N183 Chronic kidney disease, stage 3 unspecified: Secondary | ICD-10-CM | POA: Insufficient documentation

## 2022-05-31 DIAGNOSIS — Z7901 Long term (current) use of anticoagulants: Secondary | ICD-10-CM | POA: Diagnosis not present

## 2022-05-31 DIAGNOSIS — D509 Iron deficiency anemia, unspecified: Secondary | ICD-10-CM | POA: Insufficient documentation

## 2022-05-31 DIAGNOSIS — Z79899 Other long term (current) drug therapy: Secondary | ICD-10-CM | POA: Diagnosis not present

## 2022-05-31 DIAGNOSIS — D649 Anemia, unspecified: Secondary | ICD-10-CM | POA: Diagnosis not present

## 2022-05-31 DIAGNOSIS — E538 Deficiency of other specified B group vitamins: Secondary | ICD-10-CM | POA: Insufficient documentation

## 2022-05-31 LAB — CBC WITH DIFFERENTIAL/PLATELET
Abs Immature Granulocytes: 0.01 10*3/uL (ref 0.00–0.07)
Basophils Absolute: 0 10*3/uL (ref 0.0–0.1)
Basophils Relative: 1 %
Eosinophils Absolute: 0.1 10*3/uL (ref 0.0–0.5)
Eosinophils Relative: 2 %
HCT: 41.3 % (ref 36.0–46.0)
Hemoglobin: 13.1 g/dL (ref 12.0–15.0)
Immature Granulocytes: 0 %
Lymphocytes Relative: 34 %
Lymphs Abs: 1.1 10*3/uL (ref 0.7–4.0)
MCH: 28.2 pg (ref 26.0–34.0)
MCHC: 31.7 g/dL (ref 30.0–36.0)
MCV: 89 fL (ref 80.0–100.0)
Monocytes Absolute: 0.3 10*3/uL (ref 0.1–1.0)
Monocytes Relative: 10 %
Neutro Abs: 1.7 10*3/uL (ref 1.7–7.7)
Neutrophils Relative %: 53 %
Platelets: 176 10*3/uL (ref 150–400)
RBC: 4.64 MIL/uL (ref 3.87–5.11)
RDW: 14.4 % (ref 11.5–15.5)
WBC: 3.2 10*3/uL — ABNORMAL LOW (ref 4.0–10.5)
nRBC: 0 % (ref 0.0–0.2)

## 2022-05-31 LAB — IRON AND TIBC
Iron: 66 ug/dL (ref 28–170)
Saturation Ratios: 21 % (ref 10.4–31.8)
TIBC: 321 ug/dL (ref 250–450)
UIBC: 255 ug/dL

## 2022-05-31 LAB — FERRITIN: Ferritin: 72 ng/mL (ref 11–307)

## 2022-05-31 LAB — COMPREHENSIVE METABOLIC PANEL
ALT: 12 U/L (ref 0–44)
AST: 17 U/L (ref 15–41)
Albumin: 3.9 g/dL (ref 3.5–5.0)
Alkaline Phosphatase: 63 U/L (ref 38–126)
Anion gap: 6 (ref 5–15)
BUN: 33 mg/dL — ABNORMAL HIGH (ref 8–23)
CO2: 23 mmol/L (ref 22–32)
Calcium: 9.7 mg/dL (ref 8.9–10.3)
Chloride: 111 mmol/L (ref 98–111)
Creatinine, Ser: 1.42 mg/dL — ABNORMAL HIGH (ref 0.44–1.00)
GFR, Estimated: 35 mL/min — ABNORMAL LOW (ref >60)
Glucose, Bld: 85 mg/dL (ref 70–99)
Potassium: 4 mmol/L (ref 3.5–5.1)
Sodium: 140 mmol/L (ref 135–145)
Total Bilirubin: 0.8 mg/dL (ref 0.3–1.2)
Total Protein: 7.4 g/dL (ref 6.5–8.1)

## 2022-05-31 LAB — VITAMIN B12: Vitamin B-12: 1035 pg/mL — ABNORMAL HIGH (ref 180–914)

## 2022-05-31 NOTE — Progress Notes (Signed)
Patient here today for follow up regarding anemia, family present. Patient and family deny any concerns.

## 2022-05-31 NOTE — Progress Notes (Signed)
[facility] NOTE  Patient Care Team: Tracie Harrier, MD as PCP - General (Internal Medicine) Manya Silvas, MD (Inactive) (Gastroenterology) Ok Edwards, NP as Nurse Practitioner (Gastroenterology) Anthonette Legato, MD (Internal Medicine) Marval Regal, NP as Nurse Practitioner (Nurse Practitioner) Cammie Sickle, MD as Consulting Physician (Internal Medicine)  CHIEF COMPLAINTS/PURPOSE OF CONSULTATION:  Iron deficiency anemia  #Iron deficiency anemia/CKD stage III-on Venofer  # CKD- III [[facility]]    Oncology History   No history exists.   Results for Nicole Bishop (MRN [id number]) as of 05/30/2021 11:08  Ref. Range 10/18/2020 10:17 11/23/2020 09:25 12/19/2020 11:26 02/27/2021 11:22 05/30/2021 10:26  Iron Latest Ref Range: 28 - 170 ug/dL 48  108 37   UIBC Latest Units: ug/dL 285  288 369   TIBC Latest Ref Range: 250 - 450 ug/dL 333  396 406   Saturation Ratios Latest Ref Range: 10.4 - 31.8 % '14  27 9 '$ (L)   Ferritin Latest Ref Range: 11 - 307 ng/mL 85  19 23     HISTORY OF PRESENTING ILLNESS: Accompanied by her son.  In a wheel chair.  Nicole Bishop 88 y.o.  female with mild iron deficiency anemia secondary to CKD-III/IV is here for follow-up.  Patient denies any blood in stools or black-colored stools.  She complains of fatigue with exertion.  None at rest.  No nausea no abdominal pain.   Review of Systems  Constitutional:  Positive for malaise/fatigue. Negative for chills, diaphoresis, fever and weight loss.  HENT:  Negative for nosebleeds and sore throat.   Eyes:  Negative for double vision.  Respiratory:  Negative for cough, hemoptysis, sputum production, shortness of breath and wheezing.   Cardiovascular:  Negative for chest pain, palpitations, orthopnea and leg swelling.  Gastrointestinal:  Negative for abdominal pain, blood in stool, constipation, diarrhea, heartburn, melena, nausea and vomiting.   Genitourinary:  Negative for dysuria, frequency and urgency.  Musculoskeletal:  Negative for back pain and joint pain.  Skin: Negative.  Negative for itching and rash.  Neurological:  Negative for dizziness, tingling, focal weakness, weakness and headaches.  Endo/Heme/Allergies:  Does not bruise/bleed easily.  Psychiatric/Behavioral:  Negative for depression. The patient is not nervous/anxious and does not have insomnia.      MEDICAL HISTORY:  Past Medical History:  Diagnosis Date   Anemia    Anxiety    Arthritis    Chronic kidney disease    Coronary artery disease    Depression    Dizziness    Dyspnea    Dysrhythmia    gets tachy if anxious or excited   GERD (gastroesophageal reflux disease)    History of hiatal hernia    HOH (hard of hearing)    Hypertension    Hypothyroidism    IDA (iron deficiency anemia) 12/14/2014   Myocardial infarction [facility]) 1992   Renal insufficiency    Sleep apnea    Tremors of nervous system     SURGICAL HISTORY: Past Surgical History:  Procedure Laterality Date   CATARACT EXTRACTION W/PHACO Left 07/15/2017   Procedure: CATARACT EXTRACTION PHACO AND INTRAOCULAR LENS PLACEMENT (IOC)-LEFT;  Surgeon: Birder Robson, MD;  Location: [facility] ORS;  Service: Ophthalmology;  Laterality: Left;  [place] 00.36.1 AP% 13.9 CDE 5.02 Fluid Pack lot # [id number]   CATARACT EXTRACTION W/PHACO Right 08/26/2017   Procedure: CATARACT EXTRACTION PHACO AND INTRAOCULAR LENS PLACEMENT (IOC);  Surgeon: Birder Robson, MD;  Location: [facility] ORS;  Service: Ophthalmology;  Laterality: Right;  [place] 00:41.0 AP% 15.3 CDE 6.27 FLUID PACK LOT # [id number]   CHOLECYSTECTOMY     COLONOSCOPY WITH PROPOFOL N/A 04/10/2015   Procedure: COLONOSCOPY WITH PROPOFOL;  Surgeon: Manya Silvas, MD;  Location: [facility] ENDOSCOPY;  Service: Endoscopy;  Laterality: N/A;   ESOPHAGOGASTRODUODENOSCOPY (EGD) WITH PROPOFOL N/A 04/10/2015   Procedure: ESOPHAGOGASTRODUODENOSCOPY (EGD) WITH PROPOFOL;  Surgeon:  Manya Silvas, MD;  Location: [facility] ENDOSCOPY;  Service: Endoscopy;  Laterality: N/A;   fatty tumor removed on left shoulder     gallbladdedr removed     JOINT REPLACEMENT Bilateral    total knee replacements   REPLACEMENT TOTAL KNEE BILATERAL Bilateral     SOCIAL HISTORY: Social History   Socioeconomic History   Marital status: Widowed    Spouse name: Not on file   Number of children: 9   Years of education: Not on file   Highest education level: 11th grade  Occupational History    Comment: retired  Tobacco Use   Smoking status: Never   Smokeless tobacco: Never  Vaping Use   Vaping Use: Never used  Substance and Sexual Activity   Alcohol use: No   Drug use: No   Sexual activity: Never  Other Topics Concern   Not on file  Social History Narrative   Not on file   Social Determinants of Health   Financial Resource Strain: Not on file  Food Insecurity: Not on file  Transportation Needs: Not on file  Physical Activity: Not on file  Stress: Not on file  Social Connections: Not on file  Intimate Partner Violence: Not At Risk (07/31/2017)   Humiliation, Afraid, Rape, and Kick questionnaire    Fear of Current or Ex-Partner: No    Emotionally Abused: No    Physically Abused: No    Sexually Abused: No    FAMILY HISTORY: Family History  Problem Relation Age of Onset   Arthritis Mother    Hypertension Mother    Stroke Mother    Hypertension Father    Heart attack Father    Breast cancer Neg Hx     ALLERGIES:  is allergic to penicillins, nsaids, rosuvastatin, atorvastatin, celebrex [celecoxib], felodipine, oxaprozin, rofecoxib, and simvastatin.  MEDICATIONS:  Current Outpatient Medications  Medication Sig Dispense Refill   acetaminophen (TYLENOL) 650 MG CR tablet Take 650 mg by mouth every 8 (eight) hours as needed for pain.     amLODipine (NORVASC) 10 MG tablet      benazepril (LOTENSIN) 5 MG tablet Take 5 mg by mouth daily.     calcitRIOL (ROCALTROL) 0.25  MCG capsule Take 1 capsule by mouth daily.     chlorthalidone (HYGROTON) 25 MG tablet Take by mouth.     Cholecalciferol (VITAMIN D3) 2000 units capsule Take 4,000 Units by mouth daily.     cromolyn (OPTICROM) 4 % ophthalmic solution SMARTSIG:In Eye(s)     docusate sodium (COLACE) 100 MG capsule Take 100 mg by mouth 2 (two) times daily.     esomeprazole (NEXIUM) 40 MG capsule Take 40 mg by mouth daily as needed (acid reflux).     Ferrous Sulfate 142 (45 Fe) MG TBCR Take 1 tablet by mouth 2 (two) times daily.      hydrALAZINE (APRESOLINE) 25 MG tablet Take 1 tablet by mouth 2 (two) times daily.     lactulose (CHRONULAC) 10 GM/15ML solution Take 10 g by mouth daily as needed for mild constipation.      loratadine (CLARITIN) 10 MG tablet Take 1 tablet (10  mg total) by mouth daily. 30 tablet 0   metoprolol succinate (TOPROL-XL) 50 MG 24 hr tablet Take 1 tablet (50 mg total) by mouth daily. Take with or immediately following a meal. (Patient taking differently: Take 25 mg by mouth daily. Take with or immediately following a meal.) 30 tablet 0   mometasone (NASONEX) 50 MCG/ACT nasal spray Place 2 sprays into the nose daily as needed (allergies).     olopatadine (PATANOL) 0.1 % ophthalmic solution Place 1 drop into both eyes 2 (two) times daily as needed for allergies.      polyethylene glycol (MIRALAX / GLYCOLAX) packet Take 17 g by mouth daily as needed for mild constipation or moderate constipation.     potassium chloride (KLOR-CON) 10 MEQ tablet Take by mouth.     QUEtiapine (SEROQUEL) 50 MG tablet TAKE 1 TABLET BY MOUTH EVERY DAY AT NIGHT     risperiDONE (RISPERDAL) 0.25 MG tablet Take 1 tablet by mouth daily.     rivaroxaban (XARELTO) 20 MG TABS tablet Take 1 tablet (20 mg total) by mouth daily with supper. 30 tablet 0   senna-docusate (SENOKOT-S) 8.6-50 MG tablet Take 1 tablet by mouth 2 (two) times daily.     amLODipine (NORVASC) 5 MG tablet Take 5 mg by mouth daily. (Patient not taking: Reported  on 11/28/2021)     olmesartan (BENICAR) 20 MG tablet Take 20 mg by mouth daily.      No current facility-administered medications for this visit.   Facility-Administered Medications Ordered in Other Visits  Medication Dose Route Frequency Provider Last Rate Last Admin   0.9 %  sodium chloride infusion   Intravenous Continuous Lequita Asal, MD   Stopped at 02/06/18 1429      .  PHYSICAL EXAMINATION: ECOG PERFORMANCE STATUS: 1 - Symptomatic but completely ambulatory  Vitals:   05/31/22 1341  BP: (!) 151/105  Pulse: 78  Resp: 18  Temp: (!) 97 F (36.1 C)  SpO2: 95%   There were no vitals filed for this visit.   Physical Exam Vitals and nursing note reviewed.  Constitutional:      Comments: Ambulating: in wheelchair  with family  HENT:     Head: Normocephalic and atraumatic.     Mouth/Throat:     Pharynx: Oropharynx is clear.  Eyes:     Extraocular Movements: Extraocular movements intact.     Pupils: Pupils are equal, round, and reactive to light.  Cardiovascular:     Rate and Rhythm: Normal rate and regular rhythm.  Pulmonary:     Comments: Decreased breath sounds bilaterally.  Abdominal:     Palpations: Abdomen is soft.  Musculoskeletal:        General: Normal range of motion.     Cervical back: Normal range of motion.  Skin:    General: Skin is warm.  Neurological:     General: No focal deficit present.     Mental Status: She is alert and oriented to person, place, and time.  Psychiatric:        Behavior: Behavior normal.        Judgment: Judgment normal.      LABORATORY DATA:  I have reviewed the data as listed Lab Results  Component Value Date   WBC 3.2 (L) 05/31/2022   HGB 13.1 05/31/2022   HCT 41.3 05/31/2022   MCV 89.0 05/31/2022   PLT 176 05/31/2022   Recent Labs    08/23/21 0001 11/28/21 1158 05/31/22 1308  NA 136 136  140  K 3.4* 3.7 4.0  CL 107 101 111  CO2 20* 22 23  GLUCOSE 113* 93 85  BUN 26* 32* 33*  CREATININE 1.55*  1.83* 1.42*  CALCIUM 8.9 9.8 9.7  GFRNONAA 32* 26* 35*  PROT  --  7.8 7.4  ALBUMIN  --  3.8 3.9  AST  --  20 17  ALT  --  11 12  ALKPHOS  --  72 63  BILITOT  --  0.8 0.8    RADIOGRAPHIC STUDIES: I have personally reviewed the radiological images as listed and agreed with the findings in the report. No results found.  ASSESSMENT & PLAN:   Symptomatic anemia #  Iron deficiency anemia/ likley sec to CKD stage III/July, 2022- Iron sat -9%; today hemoglobin 13; hold iron infusion.  Continue oral iron twice a day.  Iron studies today pending.  # ? B12 deficiency: on PO supp.   # History of LE DVT on Xarelto-.  #CKD stage III-IVstable  #Elevated blood pressure-however as per family blood pressures are improved at home.  Continue monitoring closely if worse defer to PCP  DISPOSITION: # HOLD venofer today- # follow up in  6 months for MD and  labs (CBC with diff,cmp;iron studies/ ferritin; B12 levels]; possible venofer-Dr.B    All questions were answered. The patient knows to call the clinic with any problems, questions or concerns.       Cammie Sickle, MD 05/31/2022 3:38 PM

## 2022-05-31 NOTE — Assessment & Plan Note (Addendum)
#    Iron deficiency anemia/ likley sec to CKD stage III/July, 2022- Iron sat -9%; today hemoglobin 13; hold iron infusion.  Continue oral iron twice a day.  Iron studies today pending.  # ? B12 deficiency: on PO supp.   # History of LE DVT on Xarelto-.  #CKD stage III-IVstable  #Elevated blood pressure-however as per family blood pressures are improved at home.  Continue monitoring closely if worse defer to PCP  DISPOSITION: # HOLD venofer today- # follow up in  6 months for MD and  labs (CBC with diff,cmp;iron studies/ ferritin; B12 levels]; possible venofer-Dr.B

## 2022-10-10 ENCOUNTER — Other Ambulatory Visit: Payer: Self-pay | Admitting: Internal Medicine

## 2022-10-10 DIAGNOSIS — Z1231 Encounter for screening mammogram for malignant neoplasm of breast: Secondary | ICD-10-CM

## 2022-11-14 DIAGNOSIS — Z6833 Body mass index (BMI) 33.0-33.9, adult: Secondary | ICD-10-CM | POA: Diagnosis not present

## 2022-11-14 DIAGNOSIS — R29898 Other symptoms and signs involving the musculoskeletal system: Secondary | ICD-10-CM | POA: Diagnosis not present

## 2022-11-14 DIAGNOSIS — E782 Mixed hyperlipidemia: Secondary | ICD-10-CM | POA: Diagnosis not present

## 2022-11-14 DIAGNOSIS — E559 Vitamin D deficiency, unspecified: Secondary | ICD-10-CM | POA: Diagnosis not present

## 2022-11-14 DIAGNOSIS — N184 Chronic kidney disease, stage 4 (severe): Secondary | ICD-10-CM | POA: Diagnosis not present

## 2022-11-14 DIAGNOSIS — I10 Essential (primary) hypertension: Secondary | ICD-10-CM | POA: Diagnosis not present

## 2022-11-14 DIAGNOSIS — F5101 Primary insomnia: Secondary | ICD-10-CM | POA: Diagnosis not present

## 2022-11-14 DIAGNOSIS — R7309 Other abnormal glucose: Secondary | ICD-10-CM | POA: Diagnosis not present

## 2022-11-14 DIAGNOSIS — R8290 Unspecified abnormal findings in urine: Secondary | ICD-10-CM | POA: Diagnosis not present

## 2022-11-18 DIAGNOSIS — F0152 Vascular dementia, unspecified severity, with psychotic disturbance: Secondary | ICD-10-CM | POA: Diagnosis not present

## 2022-11-18 DIAGNOSIS — R0602 Shortness of breath: Secondary | ICD-10-CM | POA: Diagnosis not present

## 2022-11-18 DIAGNOSIS — I10 Essential (primary) hypertension: Secondary | ICD-10-CM | POA: Diagnosis not present

## 2022-11-18 DIAGNOSIS — I890 Lymphedema, not elsewhere classified: Secondary | ICD-10-CM | POA: Diagnosis not present

## 2022-11-18 DIAGNOSIS — I700 Atherosclerosis of aorta: Secondary | ICD-10-CM | POA: Diagnosis not present

## 2022-11-18 DIAGNOSIS — N184 Chronic kidney disease, stage 4 (severe): Secondary | ICD-10-CM | POA: Diagnosis not present

## 2022-11-18 DIAGNOSIS — E782 Mixed hyperlipidemia: Secondary | ICD-10-CM | POA: Diagnosis not present

## 2022-11-18 DIAGNOSIS — I5032 Chronic diastolic (congestive) heart failure: Secondary | ICD-10-CM | POA: Diagnosis not present

## 2022-11-18 DIAGNOSIS — I872 Venous insufficiency (chronic) (peripheral): Secondary | ICD-10-CM | POA: Diagnosis not present

## 2022-11-21 DIAGNOSIS — N183 Chronic kidney disease, stage 3 unspecified: Secondary | ICD-10-CM | POA: Diagnosis not present

## 2022-11-21 DIAGNOSIS — I714 Abdominal aortic aneurysm, without rupture, unspecified: Secondary | ICD-10-CM | POA: Diagnosis not present

## 2022-11-21 DIAGNOSIS — I5030 Unspecified diastolic (congestive) heart failure: Secondary | ICD-10-CM | POA: Diagnosis not present

## 2022-11-21 DIAGNOSIS — D631 Anemia in chronic kidney disease: Secondary | ICD-10-CM | POA: Diagnosis not present

## 2022-11-21 DIAGNOSIS — Z23 Encounter for immunization: Secondary | ICD-10-CM | POA: Diagnosis not present

## 2022-11-21 DIAGNOSIS — I130 Hypertensive heart and chronic kidney disease with heart failure and stage 1 through stage 4 chronic kidney disease, or unspecified chronic kidney disease: Secondary | ICD-10-CM | POA: Diagnosis not present

## 2022-11-21 DIAGNOSIS — R7309 Other abnormal glucose: Secondary | ICD-10-CM | POA: Diagnosis not present

## 2022-11-21 DIAGNOSIS — F0392 Unspecified dementia, unspecified severity, with psychotic disturbance: Secondary | ICD-10-CM | POA: Diagnosis not present

## 2022-11-21 DIAGNOSIS — Z0000 Encounter for general adult medical examination without abnormal findings: Secondary | ICD-10-CM | POA: Diagnosis not present

## 2022-11-29 MED FILL — Iron Sucrose Inj 20 MG/ML (Fe Equiv): INTRAVENOUS | Qty: 10 | Status: AC

## 2022-12-02 ENCOUNTER — Inpatient Hospital Stay: Payer: Medicare HMO | Admitting: Internal Medicine

## 2022-12-02 ENCOUNTER — Inpatient Hospital Stay: Payer: Medicare HMO

## 2022-12-24 MED FILL — Iron Sucrose Inj 20 MG/ML (Fe Equiv): INTRAVENOUS | Qty: 10 | Status: AC

## 2022-12-25 ENCOUNTER — Encounter: Payer: Self-pay | Admitting: Internal Medicine

## 2022-12-25 ENCOUNTER — Inpatient Hospital Stay: Payer: Medicare HMO

## 2022-12-25 ENCOUNTER — Inpatient Hospital Stay: Payer: Medicare HMO | Attending: Internal Medicine | Admitting: Internal Medicine

## 2022-12-25 VITALS — BP 140/85 | HR 76 | Temp 98.4°F | Ht 72.0 in | Wt 217.0 lb

## 2022-12-25 DIAGNOSIS — I252 Old myocardial infarction: Secondary | ICD-10-CM | POA: Diagnosis not present

## 2022-12-25 DIAGNOSIS — D649 Anemia, unspecified: Secondary | ICD-10-CM | POA: Diagnosis not present

## 2022-12-25 DIAGNOSIS — E538 Deficiency of other specified B group vitamins: Secondary | ICD-10-CM | POA: Diagnosis not present

## 2022-12-25 DIAGNOSIS — Z7901 Long term (current) use of anticoagulants: Secondary | ICD-10-CM | POA: Insufficient documentation

## 2022-12-25 DIAGNOSIS — D509 Iron deficiency anemia, unspecified: Secondary | ICD-10-CM | POA: Diagnosis not present

## 2022-12-25 DIAGNOSIS — N183 Chronic kidney disease, stage 3 unspecified: Secondary | ICD-10-CM | POA: Diagnosis not present

## 2022-12-25 DIAGNOSIS — Z79899 Other long term (current) drug therapy: Secondary | ICD-10-CM | POA: Insufficient documentation

## 2022-12-25 DIAGNOSIS — Z86718 Personal history of other venous thrombosis and embolism: Secondary | ICD-10-CM | POA: Insufficient documentation

## 2022-12-25 DIAGNOSIS — D631 Anemia in chronic kidney disease: Secondary | ICD-10-CM | POA: Diagnosis not present

## 2022-12-25 LAB — COMPREHENSIVE METABOLIC PANEL
ALT: 10 U/L (ref 0–44)
AST: 15 U/L (ref 15–41)
Albumin: 3.9 g/dL (ref 3.5–5.0)
Alkaline Phosphatase: 65 U/L (ref 38–126)
Anion gap: 12 (ref 5–15)
BUN: 33 mg/dL — ABNORMAL HIGH (ref 8–23)
CO2: 22 mmol/L (ref 22–32)
Calcium: 9.5 mg/dL (ref 8.9–10.3)
Chloride: 105 mmol/L (ref 98–111)
Creatinine, Ser: 1.48 mg/dL — ABNORMAL HIGH (ref 0.44–1.00)
GFR, Estimated: 34 mL/min — ABNORMAL LOW (ref >60)
Glucose, Bld: 87 mg/dL (ref 70–99)
Potassium: 3.8 mmol/L (ref 3.5–5.1)
Sodium: 139 mmol/L (ref 135–145)
Total Bilirubin: 1 mg/dL (ref 0.3–1.2)
Total Protein: 6.9 g/dL (ref 6.5–8.1)

## 2022-12-25 LAB — FERRITIN: Ferritin: 97 ng/mL (ref 11–307)

## 2022-12-25 LAB — CBC WITH DIFFERENTIAL/PLATELET
Abs Immature Granulocytes: 0.01 10*3/uL (ref 0.00–0.07)
Basophils Absolute: 0 10*3/uL (ref 0.0–0.1)
Basophils Relative: 1 %
Eosinophils Absolute: 0.1 10*3/uL (ref 0.0–0.5)
Eosinophils Relative: 2 %
HCT: 38.9 % (ref 36.0–46.0)
Hemoglobin: 12.3 g/dL (ref 12.0–15.0)
Immature Granulocytes: 0 %
Lymphocytes Relative: 34 %
Lymphs Abs: 1.2 10*3/uL (ref 0.7–4.0)
MCH: 28.1 pg (ref 26.0–34.0)
MCHC: 31.6 g/dL (ref 30.0–36.0)
MCV: 88.8 fL (ref 80.0–100.0)
Monocytes Absolute: 0.4 10*3/uL (ref 0.1–1.0)
Monocytes Relative: 10 %
Neutro Abs: 1.8 10*3/uL (ref 1.7–7.7)
Neutrophils Relative %: 53 %
Platelets: 194 10*3/uL (ref 150–400)
RBC: 4.38 MIL/uL (ref 3.87–5.11)
RDW: 14.9 % (ref 11.5–15.5)
WBC: 3.5 10*3/uL — ABNORMAL LOW (ref 4.0–10.5)
nRBC: 0 % (ref 0.0–0.2)

## 2022-12-25 LAB — IRON AND TIBC
Iron: 79 ug/dL (ref 28–170)
Saturation Ratios: 26 % (ref 10.4–31.8)
TIBC: 301 ug/dL (ref 250–450)
UIBC: 222 ug/dL

## 2022-12-25 LAB — VITAMIN B12: Vitamin B-12: 3115 pg/mL — ABNORMAL HIGH (ref 180–914)

## 2022-12-25 NOTE — Assessment & Plan Note (Signed)
#    Iron deficiency anemia/ likley sec to CKD stage III/OCT 2023 Iron sat -21%;ferritin- 72 today.  # Today hemoglobin 12.3.  hold iron infusion.  Continue oral iron twice a day.  Iron studies today pending.  # ? B12 deficiency: on PO supp.   # History of LE DVT on Xarelto-.  #CKD stage III-IVstable  #Elevated blood pressure-however as per family blood pressures are improved at home.  Continue monitoring closely if worse defer to PCP  DISPOSITION: # HOLD venofer today- # follow up in  6 months for MD and  labs (CBC with diff,cmp;iron studies/ ferritin; B12 levels]; possible venofer-Dr.B

## 2022-12-25 NOTE — Progress Notes (Signed)
Fatigue/weakness: yes Dyspena: no Light headedness: yes Blood in stool: no  C/o of both ears itching.  C/o one hand darker in color than the other.

## 2022-12-25 NOTE — Progress Notes (Signed)
[facility] CONSULT NOTE  Patient Care Team: Barbette Reichmann, MD as PCP - General (Internal Medicine) Scot Jun, MD (Inactive) (Gastroenterology) Louellen Molder, NP as Nurse Practitioner (Gastroenterology) Mady Haagensen, MD (Internal Medicine) Theadore Nan, NP as Nurse Practitioner (Nurse Practitioner) Earna Coder, MD as Consulting Physician (Internal Medicine)  CHIEF COMPLAINTS/PURPOSE OF CONSULTATION:  Iron deficiency anemia  #Iron deficiency anemia/CKD stage III-on Venofer  # CKD- III [[facility], Nephrology]    Oncology History   No history exists.   Results for KADAJAH, WILBOURN (MRN [id number]) as of 05/30/2021 11:08  Ref. Range 10/18/2020 10:17 11/23/2020 09:25 12/19/2020 11:26 02/27/2021 11:22 05/30/2021 10:26  Iron Latest Ref Range: 28 - 170 ug/dL 48  119 37   UIBC Latest Units: ug/dL 147  829 562   TIBC Latest Ref Range: 250 - 450 ug/dL 130  865 784   Saturation Ratios Latest Ref Range: 10.4 - 31.8 % 14  27 9  (L)   Ferritin Latest Ref Range: 11 - 307 ng/mL 85  19 23     HISTORY OF PRESENTING ILLNESS: Accompanied by her son.  In a wheel chair.  Heywood Bene Kozicki 89 y.o.  female with mild iron deficiency anemia secondary to CKD-III/IV is here for follow-up.  Patient complains of chronic mild fatigue.  Especially with him.  Complains of joint pains.  She walks with a walker.   Patient denies any blood in stools or black-colored stools.  No nausea no abdominal pain.  She states to be compliant with the iron pills.  Review of Systems  Constitutional:  Positive for malaise/fatigue. Negative for chills, diaphoresis, fever and weight loss.  HENT:  Negative for nosebleeds and sore throat.   Eyes:  Negative for double vision.  Respiratory:  Negative for cough, hemoptysis, sputum production, shortness of breath and wheezing.   Cardiovascular:  Negative for chest pain, palpitations, orthopnea and leg swelling.  Gastrointestinal:   Negative for abdominal pain, blood in stool, constipation, diarrhea, heartburn, melena, nausea and vomiting.  Genitourinary:  Negative for dysuria, frequency and urgency.  Musculoskeletal:  Negative for back pain and joint pain.  Skin: Negative.  Negative for itching and rash.  Neurological:  Negative for dizziness, tingling, focal weakness, weakness and headaches.  Endo/Heme/Allergies:  Does not bruise/bleed easily.  Psychiatric/Behavioral:  Negative for depression. The patient is not nervous/anxious and does not have insomnia.      MEDICAL HISTORY:  Past Medical History:  Diagnosis Date   Anemia    Anxiety    Arthritis    Chronic kidney disease    Coronary artery disease    Depression    Dizziness    Dyspnea    Dysrhythmia    gets tachy if anxious or excited   GERD (gastroesophageal reflux disease)    History of hiatal hernia    HOH (hard of hearing)    Hypertension    Hypothyroidism    IDA (iron deficiency anemia) 12/14/2014   Myocardial infarction [facility]) 1992   Renal insufficiency    Sleep apnea    Tremors of nervous system     SURGICAL HISTORY: Past Surgical History:  Procedure Laterality Date   CATARACT EXTRACTION W/PHACO Left 07/15/2017   Procedure: CATARACT EXTRACTION PHACO AND INTRAOCULAR LENS PLACEMENT (IOC)-LEFT;  Surgeon: Galen Manila, MD;  Location: [facility] ORS;  Service: Ophthalmology;  Laterality: Left;  [place] 00.36.1 AP% 13.9 CDE 5.02 Fluid Pack lot # [id number]   CATARACT EXTRACTION W/PHACO Right 08/26/2017   Procedure: CATARACT EXTRACTION  PHACO AND INTRAOCULAR LENS PLACEMENT (IOC);  Surgeon: Galen Manila, MD;  Location: [facility] ORS;  Service: Ophthalmology;  Laterality: Right;  [place] 00:41.0 AP% 15.3 CDE 6.27 FLUID PACK LOT # [id number]   CHOLECYSTECTOMY     COLONOSCOPY WITH PROPOFOL N/A 04/10/2015   Procedure: COLONOSCOPY WITH PROPOFOL;  Surgeon: Scot Jun, MD;  Location: [facility] ENDOSCOPY;  Service: Endoscopy;  Laterality: N/A;    ESOPHAGOGASTRODUODENOSCOPY (EGD) WITH PROPOFOL N/A 04/10/2015   Procedure: ESOPHAGOGASTRODUODENOSCOPY (EGD) WITH PROPOFOL;  Surgeon: Scot Jun, MD;  Location: [facility] ENDOSCOPY;  Service: Endoscopy;  Laterality: N/A;   fatty tumor removed on left shoulder     gallbladdedr removed     JOINT REPLACEMENT Bilateral    total knee replacements   REPLACEMENT TOTAL KNEE BILATERAL Bilateral     SOCIAL HISTORY: Social History   Socioeconomic History   Marital status: Widowed    Spouse name: Not on file   Number of children: 9   Years of education: Not on file   Highest education level: 11th grade  Occupational History    Comment: retired  Tobacco Use   Smoking status: Never   Smokeless tobacco: Never  Vaping Use   Vaping Use: Never used  Substance and Sexual Activity   Alcohol use: No   Drug use: No   Sexual activity: Never  Other Topics Concern   Not on file  Social History Narrative   Not on file   Social Determinants of Health   Financial Resource Strain: Not on file  Food Insecurity: Not on file  Transportation Needs: Not on file  Physical Activity: Not on file  Stress: Not on file  Social Connections: Not on file  Intimate Partner Violence: Not At Risk (07/31/2017)   Humiliation, Afraid, Rape, and Kick questionnaire    Fear of Current or Ex-Partner: No    Emotionally Abused: No    Physically Abused: No    Sexually Abused: No    FAMILY HISTORY: Family History  Problem Relation Age of Onset   Arthritis Mother    Hypertension Mother    Stroke Mother    Hypertension Father    Heart attack Father    Breast cancer Neg Hx     ALLERGIES:  is allergic to penicillins, nsaids, rosuvastatin, atorvastatin, celebrex [celecoxib], felodipine, oxaprozin, rofecoxib, and simvastatin.  MEDICATIONS:  Current Outpatient Medications  Medication Sig Dispense Refill   acetaminophen (TYLENOL) 650 MG CR tablet Take 650 mg by mouth every 8 (eight) hours as needed for pain.      amLODipine (NORVASC) 10 MG tablet      benazepril (LOTENSIN) 5 MG tablet Take 5 mg by mouth daily.     calcitRIOL (ROCALTROL) 0.25 MCG capsule Take 1 capsule by mouth daily.     chlorthalidone (HYGROTON) 25 MG tablet Take by mouth.     Cholecalciferol (VITAMIN D3) 2000 units capsule Take 4,000 Units by mouth daily.     cromolyn (OPTICROM) 4 % ophthalmic solution SMARTSIG:In Eye(s)     docusate sodium (COLACE) 100 MG capsule Take 100 mg by mouth 2 (two) times daily.     esomeprazole (NEXIUM) 40 MG capsule Take 40 mg by mouth daily as needed (acid reflux).     Ferrous Sulfate 142 (45 Fe) MG TBCR Take 1 tablet by mouth 2 (two) times daily.      hydrALAZINE (APRESOLINE) 25 MG tablet Take 1 tablet by mouth 2 (two) times daily.     lactulose (CHRONULAC) 10 GM/15ML solution Take 10 g  by mouth daily as needed for mild constipation.      loratadine (CLARITIN) 10 MG tablet Take 1 tablet (10 mg total) by mouth daily. 30 tablet 0   metoprolol succinate (TOPROL-XL) 50 MG 24 hr tablet Take 1 tablet (50 mg total) by mouth daily. Take with or immediately following a meal. (Patient taking differently: Take 25 mg by mouth daily. Take with or immediately following a meal.) 30 tablet 0   mometasone (NASONEX) 50 MCG/ACT nasal spray Place 2 sprays into the nose daily as needed (allergies).     olopatadine (PATANOL) 0.1 % ophthalmic solution Place 1 drop into both eyes 2 (two) times daily as needed for allergies.      polyethylene glycol (MIRALAX / GLYCOLAX) packet Take 17 g by mouth daily as needed for mild constipation or moderate constipation.     potassium chloride (KLOR-CON) 10 MEQ tablet Take by mouth.     QUEtiapine (SEROQUEL) 50 MG tablet TAKE 1 TABLET BY MOUTH EVERY DAY AT NIGHT     risperiDONE (RISPERDAL) 0.25 MG tablet Take 1 tablet by mouth daily.     rivaroxaban (XARELTO) 20 MG TABS tablet Take 1 tablet (20 mg total) by mouth daily with supper. 30 tablet 0   senna-docusate (SENOKOT-S) 8.6-50 MG tablet Take  1 tablet by mouth 2 (two) times daily.     amLODipine (NORVASC) 5 MG tablet Take 5 mg by mouth daily. (Patient not taking: Reported on 11/28/2021)     olmesartan (BENICAR) 20 MG tablet Take 20 mg by mouth daily.      No current facility-administered medications for this visit.   Facility-Administered Medications Ordered in Other Visits  Medication Dose Route Frequency Provider Last Rate Last Admin   0.9 %  sodium chloride infusion   Intravenous Continuous Rosey Bath, MD   Stopped at 02/06/18 1429      .  PHYSICAL EXAMINATION: ECOG PERFORMANCE STATUS: 1 - Symptomatic but completely ambulatory  Vitals:   12/25/22 1440  BP: (!) 140/85  Pulse: 76  Temp: 98.4 F (36.9 C)  SpO2: 97%   Filed Weights   12/25/22 1440  Weight: 217 lb (98.4 kg)     Physical Exam Vitals and nursing note reviewed.  Constitutional:      Comments: Ambulating: in wheelchair  with family  HENT:     Head: Normocephalic and atraumatic.     Mouth/Throat:     Pharynx: Oropharynx is clear.  Eyes:     Extraocular Movements: Extraocular movements intact.     Pupils: Pupils are equal, round, and reactive to light.  Cardiovascular:     Rate and Rhythm: Normal rate and regular rhythm.  Pulmonary:     Comments: Decreased breath sounds bilaterally.  Abdominal:     Palpations: Abdomen is soft.  Musculoskeletal:        General: Normal range of motion.     Cervical back: Normal range of motion.  Skin:    General: Skin is warm.  Neurological:     General: No focal deficit present.     Mental Status: She is alert and oriented to person, place, and time.  Psychiatric:        Behavior: Behavior normal.        Judgment: Judgment normal.      LABORATORY DATA:  I have reviewed the data as listed Lab Results  Component Value Date   WBC 3.5 (L) 12/25/2022   HGB 12.3 12/25/2022   HCT 38.9 12/25/2022   MCV  88.8 12/25/2022   PLT 194 12/25/2022   Recent Labs    05/31/22 1308 12/25/22 1443   NA 140 139  K 4.0 3.8  CL 111 105  CO2 23 22  GLUCOSE 85 87  BUN 33* 33*  CREATININE 1.42* 1.48*  CALCIUM 9.7 9.5  GFRNONAA 35* 34*  PROT 7.4 6.9  ALBUMIN 3.9 3.9  AST 17 15  ALT 12 10  ALKPHOS 63 65  BILITOT 0.8 1.0    RADIOGRAPHIC STUDIES: I have personally reviewed the radiological images as listed and agreed with the findings in the report. No results found.  ASSESSMENT & PLAN:   Symptomatic anemia #  Iron deficiency anemia/ likley sec to CKD stage III/OCT 2023 Iron sat -21%;ferritin- 72 today.  # Today hemoglobin 12.3.  hold iron infusion.  Continue oral iron twice a day.  Iron studies today pending.  # ? B12 deficiency: on PO supp.   # History of LE DVT on Xarelto-.  #CKD stage III-IVstable  #Elevated blood pressure-however as per family blood pressures are improved at home.  Continue monitoring closely if worse defer to PCP  DISPOSITION: # HOLD venofer today- # follow up in  6 months for MD and  labs (CBC with diff,cmp;iron studies/ ferritin; B12 levels]; possible venofer-Dr.B    All questions were answered. The patient knows to call the clinic with any problems, questions or concerns.       Earna Coder, MD 12/25/2022 3:30 PM

## 2023-01-08 IMAGING — MG MM DIGITAL SCREENING BILAT W/ TOMO AND CAD
8 series · 8 of 24 positions shown · non-contrast
Comparison: Previous exam(s).

CLINICAL DATA: Screening.

EXAM:
DIGITAL SCREENING BILATERAL MAMMOGRAM WITH TOMOSYNTHESIS AND CAD
TECHNIQUE: Bilateral screening digital craniocaudal and mediolateral oblique
mammograms were obtained. Bilateral screening digital breast
tomosynthesis was performed. The images were evaluated with
computer-aided detection.

[R CC synth-2D]
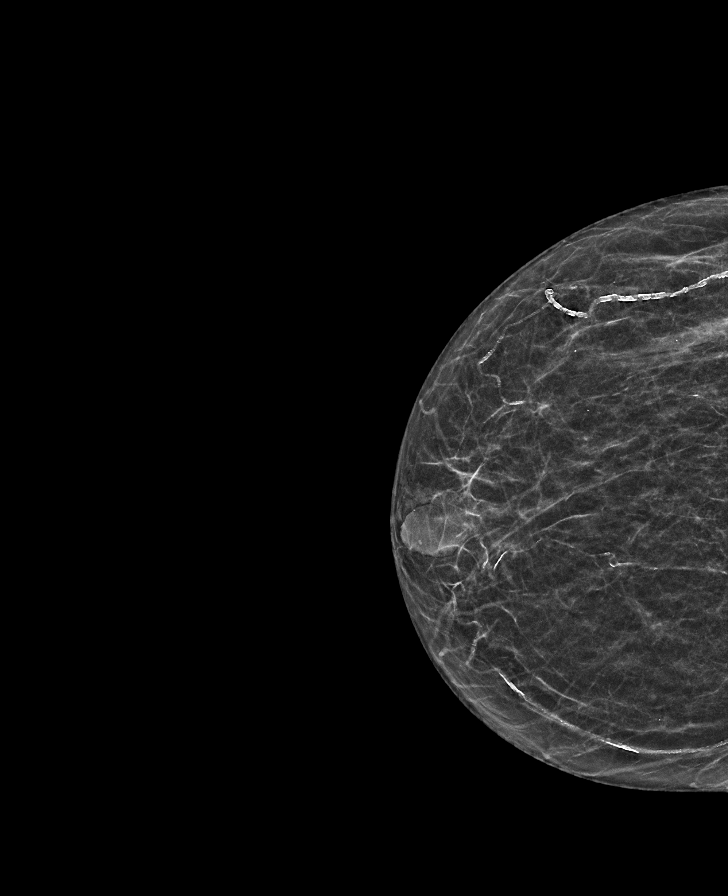

[L CC synth-2D]
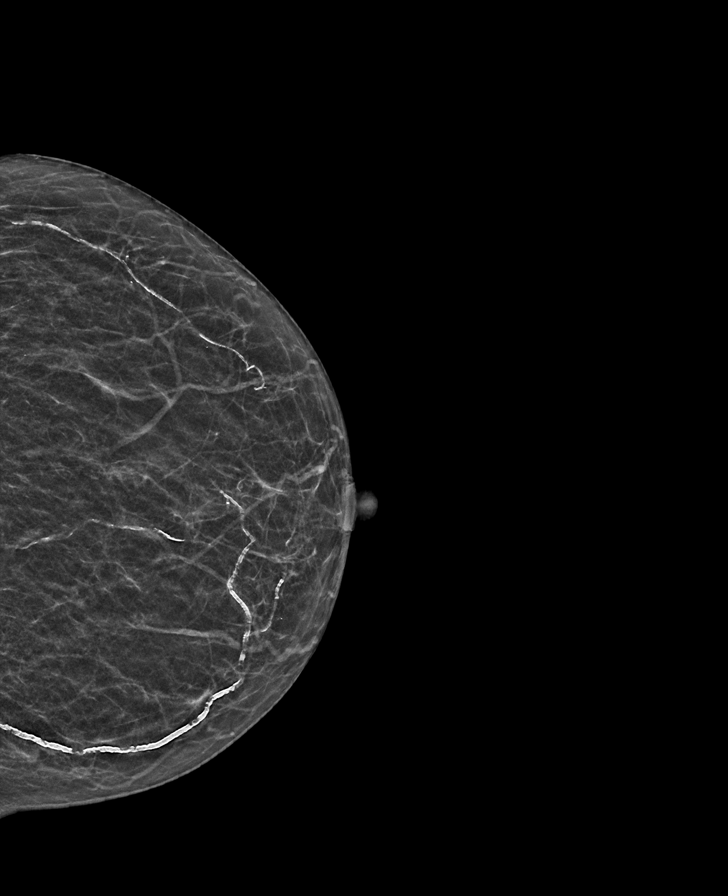

[L MLO synth-2D]
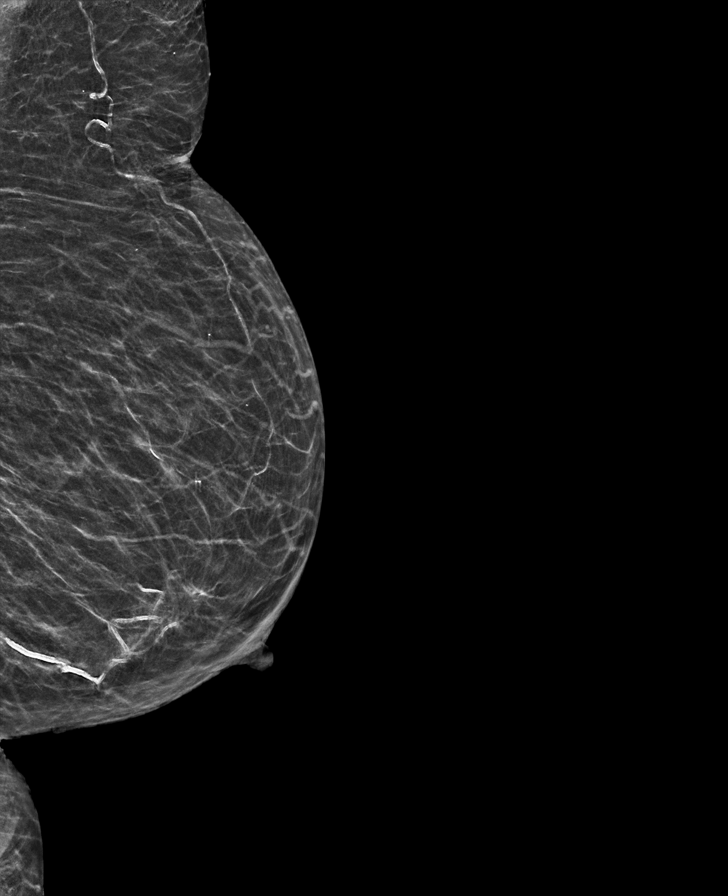

[R MLO synth-2D]
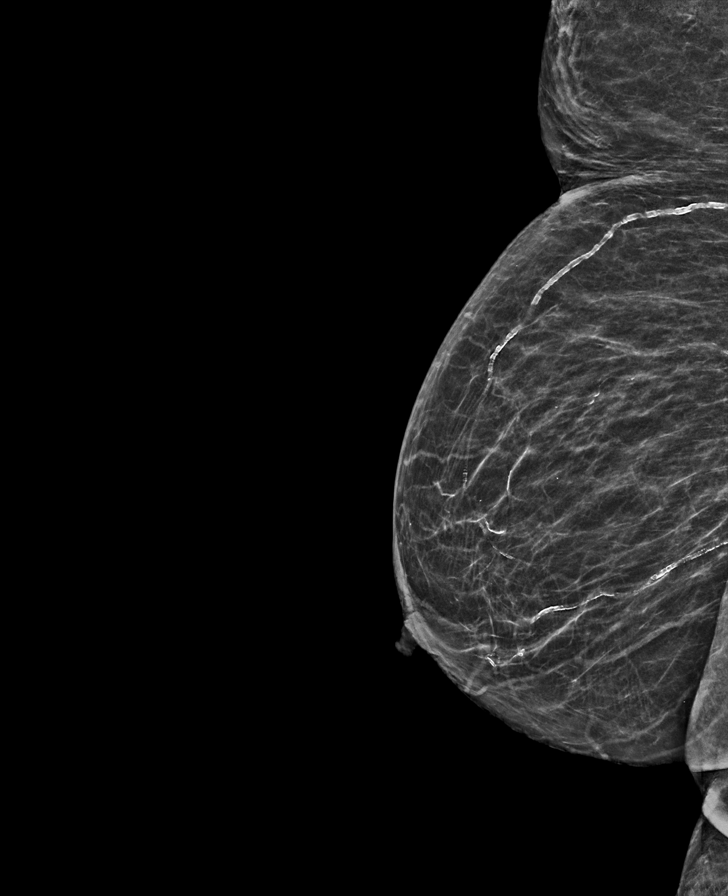

[R CC tomo · tomo slice 23/45.0]
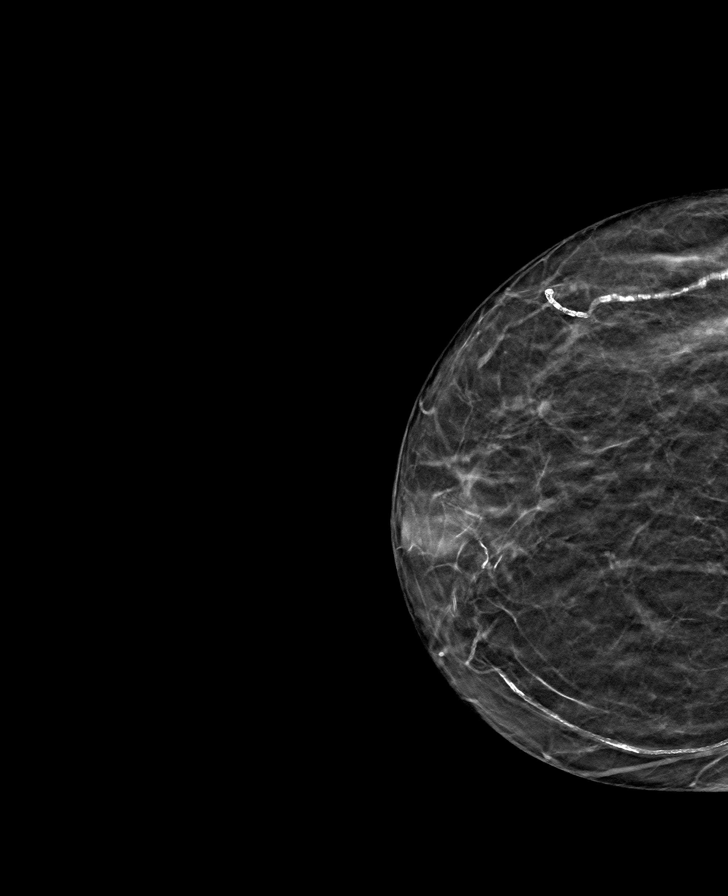

[L MLO tomo · tomo slice 19/37.0]
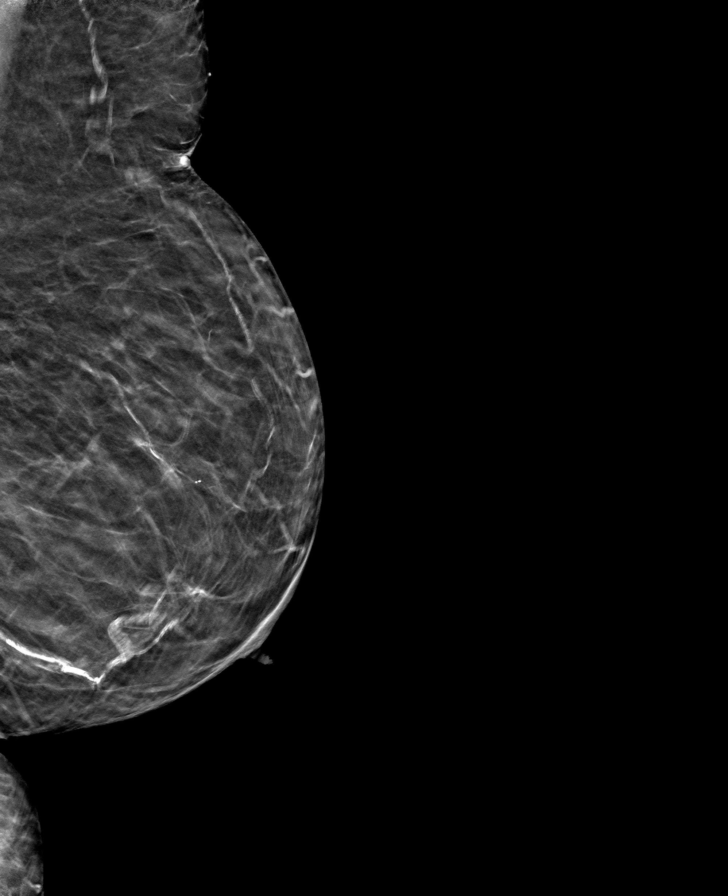

[R MLO tomo · tomo slice 26/51.0]
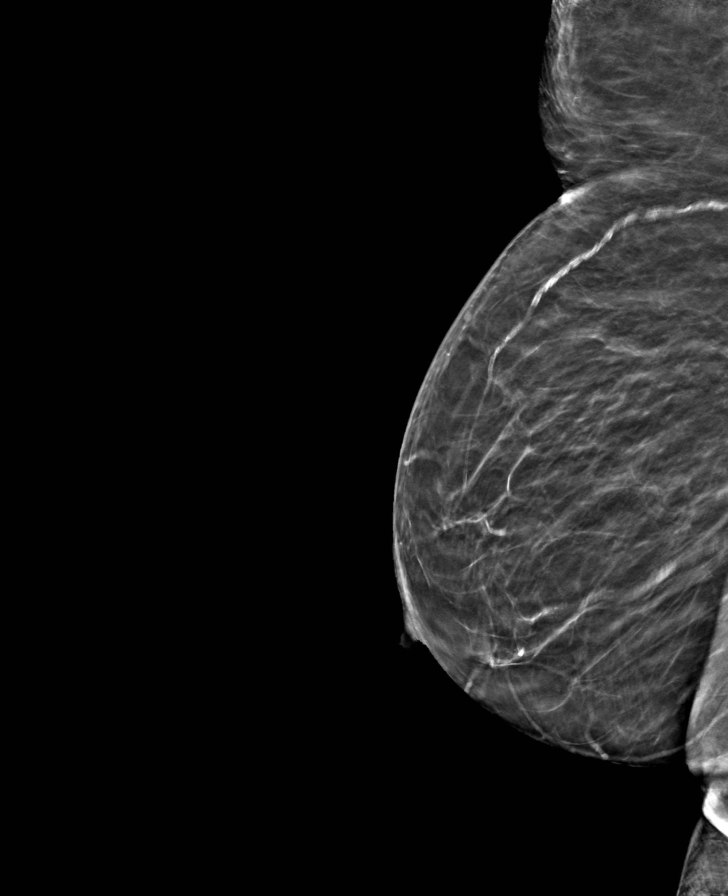

[L CC tomo · tomo slice 16/31.0]
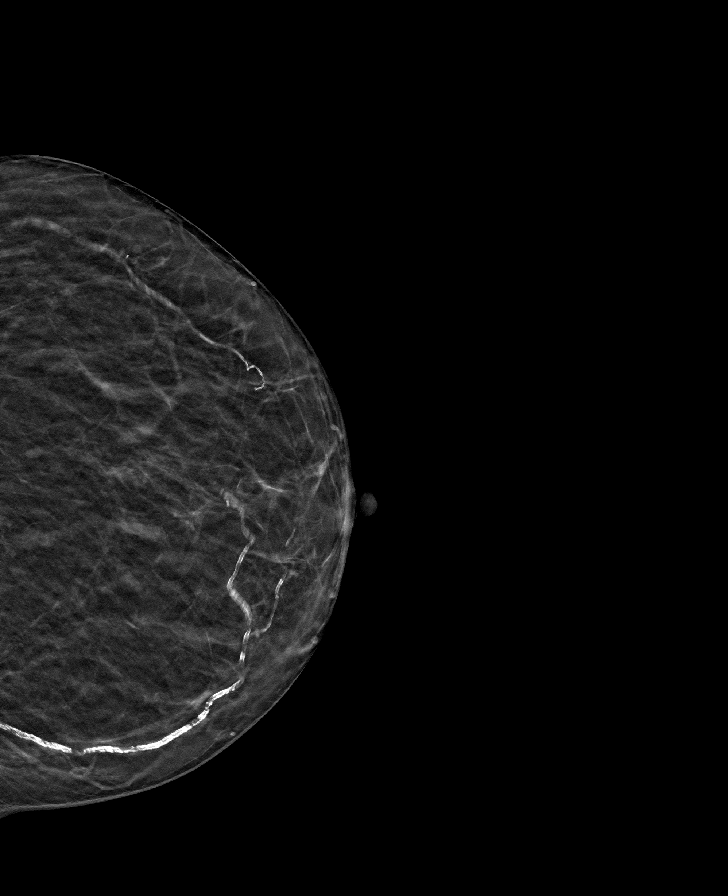

[8 of 24 positions shown; findings below may reference images not displayed]

ACR Breast Density Category b: There are scattered areas of
fibroglandular density.
FINDINGS: There are no findings suspicious for malignancy.
IMPRESSION: No mammographic evidence of malignancy. A result letter of this
screening mammogram will be mailed directly to the patient.

RECOMMENDATION:
Screening mammogram in one year. (Code:51-O-LD2)

BI-RADS CATEGORY  1: Negative.

## 2023-04-11 DIAGNOSIS — I480 Paroxysmal atrial fibrillation: Secondary | ICD-10-CM | POA: Diagnosis not present

## 2023-04-11 DIAGNOSIS — I700 Atherosclerosis of aorta: Secondary | ICD-10-CM | POA: Diagnosis not present

## 2023-04-11 DIAGNOSIS — I130 Hypertensive heart and chronic kidney disease with heart failure and stage 1 through stage 4 chronic kidney disease, or unspecified chronic kidney disease: Secondary | ICD-10-CM | POA: Diagnosis not present

## 2023-04-11 DIAGNOSIS — R262 Difficulty in walking, not elsewhere classified: Secondary | ICD-10-CM | POA: Diagnosis not present

## 2023-04-11 DIAGNOSIS — M79675 Pain in left toe(s): Secondary | ICD-10-CM | POA: Diagnosis not present

## 2023-04-11 DIAGNOSIS — F03A2 Unspecified dementia, mild, with psychotic disturbance: Secondary | ICD-10-CM | POA: Diagnosis not present

## 2023-04-11 DIAGNOSIS — I701 Atherosclerosis of renal artery: Secondary | ICD-10-CM | POA: Diagnosis not present

## 2023-04-11 DIAGNOSIS — R413 Other amnesia: Secondary | ICD-10-CM | POA: Diagnosis not present

## 2023-04-11 DIAGNOSIS — M79674 Pain in right toe(s): Secondary | ICD-10-CM | POA: Diagnosis not present

## 2023-04-11 DIAGNOSIS — I5032 Chronic diastolic (congestive) heart failure: Secondary | ICD-10-CM | POA: Diagnosis not present

## 2023-04-11 DIAGNOSIS — I7143 Infrarenal abdominal aortic aneurysm, without rupture: Secondary | ICD-10-CM | POA: Diagnosis not present

## 2023-04-11 DIAGNOSIS — L819 Disorder of pigmentation, unspecified: Secondary | ICD-10-CM | POA: Diagnosis not present

## 2023-04-11 DIAGNOSIS — B351 Tinea unguium: Secondary | ICD-10-CM | POA: Diagnosis not present

## 2023-04-11 DIAGNOSIS — R441 Visual hallucinations: Secondary | ICD-10-CM | POA: Diagnosis not present

## 2023-04-11 DIAGNOSIS — N1832 Chronic kidney disease, stage 3b: Secondary | ICD-10-CM | POA: Diagnosis not present

## 2023-04-11 DIAGNOSIS — I872 Venous insufficiency (chronic) (peripheral): Secondary | ICD-10-CM | POA: Diagnosis not present

## 2023-04-11 DIAGNOSIS — Z6828 Body mass index (BMI) 28.0-28.9, adult: Secondary | ICD-10-CM | POA: Diagnosis not present

## 2023-04-11 DIAGNOSIS — I890 Lymphedema, not elsewhere classified: Secondary | ICD-10-CM | POA: Diagnosis not present

## 2023-04-11 DIAGNOSIS — R7309 Other abnormal glucose: Secondary | ICD-10-CM | POA: Diagnosis not present

## 2023-04-11 DIAGNOSIS — N183 Chronic kidney disease, stage 3 unspecified: Secondary | ICD-10-CM | POA: Diagnosis not present

## 2023-04-11 DIAGNOSIS — G629 Polyneuropathy, unspecified: Secondary | ICD-10-CM | POA: Diagnosis not present

## 2023-04-11 DIAGNOSIS — I10 Essential (primary) hypertension: Secondary | ICD-10-CM | POA: Diagnosis not present

## 2023-06-16 DIAGNOSIS — I5032 Chronic diastolic (congestive) heart failure: Secondary | ICD-10-CM | POA: Diagnosis not present

## 2023-06-16 DIAGNOSIS — I890 Lymphedema, not elsewhere classified: Secondary | ICD-10-CM | POA: Diagnosis not present

## 2023-06-16 DIAGNOSIS — R0602 Shortness of breath: Secondary | ICD-10-CM | POA: Diagnosis not present

## 2023-06-16 DIAGNOSIS — I10 Essential (primary) hypertension: Secondary | ICD-10-CM | POA: Diagnosis not present

## 2023-06-16 DIAGNOSIS — I872 Venous insufficiency (chronic) (peripheral): Secondary | ICD-10-CM | POA: Diagnosis not present

## 2023-06-16 DIAGNOSIS — E782 Mixed hyperlipidemia: Secondary | ICD-10-CM | POA: Diagnosis not present

## 2023-06-16 DIAGNOSIS — N184 Chronic kidney disease, stage 4 (severe): Secondary | ICD-10-CM | POA: Diagnosis not present

## 2023-06-27 ENCOUNTER — Ambulatory Visit: Payer: Medicare HMO | Admitting: Internal Medicine

## 2023-06-27 ENCOUNTER — Ambulatory Visit: Payer: Medicare HMO

## 2023-06-27 ENCOUNTER — Other Ambulatory Visit: Payer: Medicare HMO

## 2023-07-01 ENCOUNTER — Inpatient Hospital Stay: Payer: Medicare HMO | Admitting: Nurse Practitioner

## 2023-07-01 ENCOUNTER — Inpatient Hospital Stay: Payer: Medicare HMO

## 2023-07-10 ENCOUNTER — Encounter: Payer: Self-pay | Admitting: Nurse Practitioner

## 2023-07-10 ENCOUNTER — Inpatient Hospital Stay: Payer: Medicare HMO | Attending: Internal Medicine

## 2023-07-10 ENCOUNTER — Other Ambulatory Visit: Payer: Self-pay

## 2023-07-10 ENCOUNTER — Inpatient Hospital Stay: Payer: Medicare HMO

## 2023-07-10 ENCOUNTER — Inpatient Hospital Stay: Payer: Medicare HMO | Admitting: Nurse Practitioner

## 2023-07-10 VITALS — BP 165/107 | HR 62 | Temp 95.9°F | Resp 20 | Ht 72.0 in | Wt 207.5 lb

## 2023-07-10 DIAGNOSIS — E538 Deficiency of other specified B group vitamins: Secondary | ICD-10-CM | POA: Insufficient documentation

## 2023-07-10 DIAGNOSIS — D631 Anemia in chronic kidney disease: Secondary | ICD-10-CM | POA: Diagnosis not present

## 2023-07-10 DIAGNOSIS — N1832 Chronic kidney disease, stage 3b: Secondary | ICD-10-CM | POA: Diagnosis not present

## 2023-07-10 DIAGNOSIS — I252 Old myocardial infarction: Secondary | ICD-10-CM | POA: Diagnosis not present

## 2023-07-10 DIAGNOSIS — Z7901 Long term (current) use of anticoagulants: Secondary | ICD-10-CM | POA: Diagnosis not present

## 2023-07-10 DIAGNOSIS — R030 Elevated blood-pressure reading, without diagnosis of hypertension: Secondary | ICD-10-CM | POA: Insufficient documentation

## 2023-07-10 DIAGNOSIS — R5383 Other fatigue: Secondary | ICD-10-CM | POA: Diagnosis not present

## 2023-07-10 DIAGNOSIS — Z79899 Other long term (current) drug therapy: Secondary | ICD-10-CM | POA: Diagnosis not present

## 2023-07-10 DIAGNOSIS — D649 Anemia, unspecified: Secondary | ICD-10-CM

## 2023-07-10 DIAGNOSIS — D509 Iron deficiency anemia, unspecified: Secondary | ICD-10-CM | POA: Diagnosis not present

## 2023-07-10 DIAGNOSIS — N183 Chronic kidney disease, stage 3 unspecified: Secondary | ICD-10-CM | POA: Diagnosis not present

## 2023-07-10 DIAGNOSIS — F0390 Unspecified dementia without behavioral disturbance: Secondary | ICD-10-CM | POA: Insufficient documentation

## 2023-07-10 DIAGNOSIS — Z86718 Personal history of other venous thrombosis and embolism: Secondary | ICD-10-CM | POA: Insufficient documentation

## 2023-07-10 LAB — CBC WITH DIFFERENTIAL (CANCER CENTER ONLY)
Abs Immature Granulocytes: 0.01 10*3/uL (ref 0.00–0.07)
Basophils Absolute: 0 10*3/uL (ref 0.0–0.1)
Basophils Relative: 1 %
Eosinophils Absolute: 0.1 10*3/uL (ref 0.0–0.5)
Eosinophils Relative: 2 %
HCT: 35.8 % — ABNORMAL LOW (ref 36.0–46.0)
Hemoglobin: 11.1 g/dL — ABNORMAL LOW (ref 12.0–15.0)
Immature Granulocytes: 0 %
Lymphocytes Relative: 31 %
Lymphs Abs: 1 10*3/uL (ref 0.7–4.0)
MCH: 28.6 pg (ref 26.0–34.0)
MCHC: 31 g/dL (ref 30.0–36.0)
MCV: 92.3 fL (ref 80.0–100.0)
Monocytes Absolute: 0.3 10*3/uL (ref 0.1–1.0)
Monocytes Relative: 11 %
Neutro Abs: 1.7 10*3/uL (ref 1.7–7.7)
Neutrophils Relative %: 55 %
Platelet Count: 165 10*3/uL (ref 150–400)
RBC: 3.88 MIL/uL (ref 3.87–5.11)
RDW: 16.7 % — ABNORMAL HIGH (ref 11.5–15.5)
WBC Count: 3.1 10*3/uL — ABNORMAL LOW (ref 4.0–10.5)
nRBC: 0 % (ref 0.0–0.2)

## 2023-07-10 LAB — CMP (CANCER CENTER ONLY)
ALT: 13 U/L (ref 0–44)
AST: 15 U/L (ref 15–41)
Albumin: 3.6 g/dL (ref 3.5–5.0)
Alkaline Phosphatase: 79 U/L (ref 38–126)
Anion gap: 10 (ref 5–15)
BUN: 28 mg/dL — ABNORMAL HIGH (ref 8–23)
CO2: 26 mmol/L (ref 22–32)
Calcium: 9.4 mg/dL (ref 8.9–10.3)
Chloride: 106 mmol/L (ref 98–111)
Creatinine: 1.44 mg/dL — ABNORMAL HIGH (ref 0.44–1.00)
GFR, Estimated: 35 mL/min — ABNORMAL LOW (ref >60)
Glucose, Bld: 100 mg/dL — ABNORMAL HIGH (ref 70–99)
Potassium: 3.6 mmol/L (ref 3.5–5.1)
Sodium: 142 mmol/L (ref 135–145)
Total Bilirubin: 0.7 mg/dL (ref <1.2)
Total Protein: 7 g/dL (ref 6.5–8.1)

## 2023-07-10 LAB — IRON AND TIBC
Iron: 69 ug/dL (ref 28–170)
Saturation Ratios: 22 % (ref 10.4–31.8)
TIBC: 319 ug/dL (ref 250–450)
UIBC: 250 ug/dL

## 2023-07-10 LAB — FERRITIN: Ferritin: 117 ng/mL (ref 11–307)

## 2023-07-10 LAB — VITAMIN B12: Vitamin B-12: 2520 pg/mL — ABNORMAL HIGH (ref 180–914)

## 2023-07-10 NOTE — Progress Notes (Signed)
[facility] CONSULT NOTE  Patient Care Team: Barbette Reichmann, MD as PCP - General (Internal Medicine) Scot Jun, MD (Inactive) (Gastroenterology) Louellen Molder, NP as Nurse Practitioner (Gastroenterology) Mady Haagensen, MD (Internal Medicine) Theadore Nan, NP as Nurse Practitioner (Nurse Practitioner) Earna Coder, MD as Consulting Physician (Internal Medicine)  CHIEF COMPLAINTS/PURPOSE OF CONSULTATION:  Iron deficiency anemia  #Iron deficiency anemia/CKD stage III-on Venofer  # CKD- III [[facility], Neph[facility]]   Oncology History   No history exists.   Results for AMARIELLE, LOVEN (MRN [id number]) as of 05/30/2021 11:08  Ref. Range 10/18/2020 10:17 11/23/2020 09:25 12/19/2020 11:26 02/27/2021 11:22 05/30/2021 10:26  Iron Latest Ref Range: 28 - 170 ug/dL 48  027 37   UIBC Latest Units: ug/dL 253  664 403   TIBC Latest Ref Range: 250 - 450 ug/dL 474  259 563   Saturation Ratios Latest Ref Range: 10.4 - 31.8 % 14  27 9  (L)   Ferritin Latest Ref Range: 11 - 307 ng/mL 85  19 23     HISTORY OF PRESENTING ILLNESS: Accompanied by her son.  In a wheel chair.  Nicole Bishop 89 y.o.  female with mild iron deficiency anemia secondary to CKD-III/IV is here for follow-up. Her aches and pains are stable. Denies black or bloody stools. Daughter concerned of her reluctance to stay active. Patient says she doesn't want to.Reports compliance with oral iron.   Review of Systems  Unable to perform ROS: Dementia  Constitutional:  Positive for malaise/fatigue. Negative for weight loss.  Respiratory:  Negative for shortness of breath.   Gastrointestinal:  Negative for blood in stool and melena.  Musculoskeletal:  Positive for joint pain and myalgias. Negative for falls.  Psychiatric/Behavioral:  Negative for depression. The patient is not nervous/anxious.      MEDICAL HISTORY:  Past Medical History:  Diagnosis Date   Anemia    Anxiety    Arthritis     Chronic kidney disease    Coronary artery disease    Depression    Dizziness    Dyspnea    Dysrhythmia    gets tachy if anxious or excited   GERD (gastroesophageal reflux disease)    History of hiatal hernia    HOH (hard of hearing)    Hypertension    Hypothyroidism    IDA (iron deficiency anemia) 12/14/2014   Myocardial infarction [facility]) 1992   Renal insufficiency    Sleep apnea    Tremors of nervous system     SURGICAL HISTORY: Past Surgical History:  Procedure Laterality Date   CATARACT EXTRACTION W/PHACO Left 07/15/2017   Procedure: CATARACT EXTRACTION PHACO AND INTRAOCULAR LENS PLACEMENT (IOC)-LEFT;  Surgeon: Galen Manila, MD;  Location: [facility] ORS;  Service: Ophthalmology;  Laterality: Left;  [place] 00.36.1 AP% 13.9 CDE 5.02 Fluid Pack lot # [id number]   CATARACT EXTRACTION W/PHACO Right 08/26/2017   Procedure: CATARACT EXTRACTION PHACO AND INTRAOCULAR LENS PLACEMENT (IOC);  Surgeon: Galen Manila, MD;  Location: [facility] ORS;  Service: Ophthalmology;  Laterality: Right;  [place] 00:41.0 AP% 15.3 CDE 6.27 FLUID PACK LOT # [id number]   CHOLECYSTECTOMY     COLONOSCOPY WITH PROPOFOL N/A 04/10/2015   Procedure: COLONOSCOPY WITH PROPOFOL;  Surgeon: Scot Jun, MD;  Location: [facility] ENDOSCOPY;  Service: Endoscopy;  Laterality: N/A;   ESOPHAGOGASTRODUODENOSCOPY (EGD) WITH PROPOFOL N/A 04/10/2015   Procedure: ESOPHAGOGASTRODUODENOSCOPY (EGD) WITH PROPOFOL;  Surgeon: Scot Jun, MD;  Location: [facility] ENDOSCOPY;  Service: Endoscopy;  Laterality: N/A;  fatty tumor removed on left shoulder     gallbladdedr removed     JOINT REPLACEMENT Bilateral    total knee replacements   REPLACEMENT TOTAL KNEE BILATERAL Bilateral     SOCIAL HISTORY: Social History   Socioeconomic History   Marital status: Widowed    Spouse name: Not on file   Number of children: 9   Years of education: Not on file   Highest education level: 11th grade  Occupational History    Comment: retired  Tobacco  Use   Smoking status: Never   Smokeless tobacco: Never  Vaping Use   Vaping status: Never Used  Substance and Sexual Activity   Alcohol use: No   Drug use: No   Sexual activity: Never  Other Topics Concern   Not on file  Social History Narrative   Not on file   Social Determinants of Health   Financial Resource Strain: Low Risk  (04/11/2023)   Received from [facility]   Overall Financial Resource Strain (CARDIA)    Difficulty of Paying Living Expenses: Not hard at all  Food Insecurity: No Food Insecurity (04/11/2023)   Received from [facility]   Hunger Vital Sign    Worried About Running Out of Food in the Last Year: Never true    Ran Out of Food in the Last Year: Never true  Transportation Needs: No Transportation Needs (04/11/2023)   Received from [facility] - Transportation    In the past 12 months, has lack of transportation kept you from medical appointments or from getting medications?: No    Lack of Transportation (Non-Medical): No  Physical Activity: Not on file  Stress: Not on file  Social Connections: Not on file  Intimate Partner Violence: Not At Risk (07/31/2017)   Humiliation, Afraid, Rape, and Kick questionnaire    Fear of Current or Ex-Partner: No    Emotionally Abused: No    Physically Abused: No    Sexually Abused: No    FAMILY HISTORY: Family History  Problem Relation Age of Onset   Arthritis Mother    Hypertension Mother    Stroke Mother    Hypertension Father    Heart attack Father    Breast cancer Neg Hx     ALLERGIES:  is allergic to penicillins, nsaids, rosuvastatin, atorvastatin, celebrex [celecoxib], felodipine, oxaprozin, rofecoxib, and simvastatin.  MEDICATIONS:  Current Outpatient Medications  Medication Sig Dispense Refill   acetaminophen (TYLENOL) 650 MG CR tablet Take 650 mg by mouth every 8 (eight) hours as needed for pain.     amLODipine (NORVASC) 10 MG tablet       calcitRIOL (ROCALTROL) 0.25 MCG capsule Take 1 capsule by mouth daily.     Cholecalciferol (VITAMIN D3) 2000 units capsule Take 4,000 Units by mouth daily.     cromolyn (OPTICROM) 4 % ophthalmic solution SMARTSIG:In Eye(s)     docusate sodium (COLACE) 100 MG capsule Take 100 mg by mouth 2 (two) times daily.     Ferrous Sulfate 142 (45 Fe) MG TBCR Take 1 tablet by mouth 2 (two) times daily.      loratadine (CLARITIN) 10 MG tablet Take 1 tablet (10 mg total) by mouth daily. 30 tablet 0   metoprolol succinate (TOPROL-XL) 25 MG 24 hr tablet Take 12.5 mg by mouth daily.     mometasone (NASONEX) 50 MCG/ACT nasal spray Place 2 sprays into the nose daily as needed (allergies).     olopatadine (  PATANOL) 0.1 % ophthalmic solution Place 1 drop into both eyes 2 (two) times daily as needed for allergies.      polyethylene glycol (MIRALAX / GLYCOLAX) packet Take 17 g by mouth daily as needed for mild constipation or moderate constipation.     rivaroxaban (XARELTO) 20 MG TABS tablet Take 1 tablet (20 mg total) by mouth daily with supper. 30 tablet 0   traZODone (DESYREL) 50 MG tablet Take 50 mg by mouth at bedtime.     amLODipine (NORVASC) 5 MG tablet Take 5 mg by mouth daily. (Patient not taking: Reported on 11/28/2021)     No current facility-administered medications for this visit.   Facility-Administered Medications Ordered in Other Visits  Medication Dose Route Frequency Provider Last Rate Last Admin   0.9 %  sodium chloride infusion   Intravenous Continuous Rosey Bath, MD   Stopped at 02/06/18 1429    PHYSICAL EXAMINATION: ECOG PERFORMANCE STATUS: 3 - Symptomatic, >50% confined to bed  Vitals:   07/10/23 1512  Resp: 20   Filed Weights   07/10/23 1512  Weight: 207 lb 8 oz (94.1 kg)   Physical Exam Vitals reviewed.  Constitutional:      Appearance: She is not ill-appearing.     Comments: Frail appearing. In wheelchair.   Pulmonary:     Effort: No respiratory distress.  Skin:     Coloration: Skin is not pale.  Neurological:     Mental Status: She is alert. Mental status is at baseline.  Psychiatric:        Behavior: Behavior normal.    LABORATORY DATA:  I have reviewed the data as listed Lab Results  Component Value Date   WBC 3.1 (L) 07/10/2023   HGB 11.1 (L) 07/10/2023   HCT 35.8 (L) 07/10/2023   MCV 92.3 07/10/2023   PLT 165 07/10/2023   Recent Labs    12/25/22 1443 07/10/23 1455  NA 139 142  K 3.8 3.6  CL 105 106  CO2 22 26  GLUCOSE 87 100*  BUN 33* 28*  CREATININE 1.48* 1.44*  CALCIUM 9.5 9.4  GFRNONAA 34* 35*  PROT 6.9 7.0  ALBUMIN 3.9 3.6  AST 15 15  ALT 10 13  ALKPHOS 65 79  BILITOT 1.0 0.7   Iron/TIBC/Ferritin/ %Sat    Component Value Date/Time   IRON 79 12/25/2022 1443   IRON 45 11/21/2014 0848   TIBC 301 12/25/2022 1443   TIBC 358 11/21/2014 0848   FERRITIN 97 12/25/2022 1443   FERRITIN 92 11/21/2014 0848   IRONPCTSAT 26 12/25/2022 1443   IRONPCTSAT 12.6 11/21/2014 0848     RADIOGRAPHIC STUDIES: I have personally reviewed the radiological images as listed and agreed with the findings in the report. No results found.  ASSESSMENT & PLAN:   #  Iron deficiency anemia/ likley sec to CKD stage III/OCT 2023 Iron sat -21%;ferritin- 72 today.   # Today hemoglobin 11.1. Last venofer 2022. Hold Iron infusion today. Continue oral iron twice a day. Tolerating well. Iron studies pending.    # ? B12 deficiency: on PO supp.    # History of LE DVT on Xarelto-.   #CKD stage III-IVstable   #Elevated blood pressure-however as per family blood pressures are improved at home.  Continue monitoring closely if worse defer to PCP  # Dementia & Frailty- daughter is caregiver and expressed concern of strain. Reviewed options locally such as [facility], senior center, dept of aging.    DISPOSITION: # HOLD venofer today-  6 month- lab (cbc, ferritin, iron studies, b12, cmp), Dr Donneta Romberg, +/- venofer- la  No problem-specific Assessment &  Plan notes found for this encounter.  All questions were answered. The patient knows to call the clinic with any problems, questions or concerns.    Alinda Dooms, NP 07/10/2023

## 2023-07-30 DIAGNOSIS — N2581 Secondary hyperparathyroidism of renal origin: Secondary | ICD-10-CM | POA: Diagnosis not present

## 2023-07-30 DIAGNOSIS — I10 Essential (primary) hypertension: Secondary | ICD-10-CM | POA: Diagnosis not present

## 2023-07-30 DIAGNOSIS — N1832 Chronic kidney disease, stage 3b: Secondary | ICD-10-CM | POA: Diagnosis not present

## 2023-08-01 DIAGNOSIS — Z823 Family history of stroke: Secondary | ICD-10-CM

## 2023-08-01 DIAGNOSIS — Z883 Allergy status to other anti-infective agents status: Secondary | ICD-10-CM

## 2023-08-01 DIAGNOSIS — J90 Pleural effusion, not elsewhere classified: Secondary | ICD-10-CM | POA: Diagnosis not present

## 2023-08-01 DIAGNOSIS — Z888 Allergy status to other drugs, medicaments and biological substances status: Secondary | ICD-10-CM

## 2023-08-01 DIAGNOSIS — Z79899 Other long term (current) drug therapy: Secondary | ICD-10-CM

## 2023-08-01 DIAGNOSIS — F05 Delirium due to known physiological condition: Secondary | ICD-10-CM | POA: Diagnosis not present

## 2023-08-01 DIAGNOSIS — R4701 Aphasia: Secondary | ICD-10-CM | POA: Diagnosis present

## 2023-08-01 DIAGNOSIS — I5032 Chronic diastolic (congestive) heart failure: Secondary | ICD-10-CM | POA: Diagnosis present

## 2023-08-01 DIAGNOSIS — G9341 Metabolic encephalopathy: Secondary | ICD-10-CM | POA: Diagnosis not present

## 2023-08-01 DIAGNOSIS — E785 Hyperlipidemia, unspecified: Secondary | ICD-10-CM | POA: Diagnosis present

## 2023-08-01 DIAGNOSIS — F0150 Vascular dementia without behavioral disturbance: Secondary | ICD-10-CM | POA: Diagnosis present

## 2023-08-01 DIAGNOSIS — Z96653 Presence of artificial knee joint, bilateral: Secondary | ICD-10-CM | POA: Diagnosis present

## 2023-08-01 DIAGNOSIS — I5031 Acute diastolic (congestive) heart failure: Secondary | ICD-10-CM | POA: Diagnosis not present

## 2023-08-01 DIAGNOSIS — F419 Anxiety disorder, unspecified: Secondary | ICD-10-CM | POA: Diagnosis present

## 2023-08-01 DIAGNOSIS — R531 Weakness: Secondary | ICD-10-CM | POA: Diagnosis not present

## 2023-08-01 DIAGNOSIS — I2510 Atherosclerotic heart disease of native coronary artery without angina pectoris: Secondary | ICD-10-CM | POA: Diagnosis present

## 2023-08-01 DIAGNOSIS — E039 Hypothyroidism, unspecified: Secondary | ICD-10-CM | POA: Diagnosis present

## 2023-08-01 DIAGNOSIS — R29818 Other symptoms and signs involving the nervous system: Secondary | ICD-10-CM | POA: Diagnosis not present

## 2023-08-01 DIAGNOSIS — J690 Pneumonitis due to inhalation of food and vomit: Principal | ICD-10-CM | POA: Diagnosis present

## 2023-08-01 DIAGNOSIS — R1310 Dysphagia, unspecified: Secondary | ICD-10-CM | POA: Diagnosis present

## 2023-08-01 DIAGNOSIS — E876 Hypokalemia: Secondary | ICD-10-CM | POA: Diagnosis present

## 2023-08-01 DIAGNOSIS — F32A Depression, unspecified: Secondary | ICD-10-CM | POA: Diagnosis present

## 2023-08-01 DIAGNOSIS — K219 Gastro-esophageal reflux disease without esophagitis: Secondary | ICD-10-CM | POA: Diagnosis present

## 2023-08-01 DIAGNOSIS — E860 Dehydration: Secondary | ICD-10-CM | POA: Diagnosis present

## 2023-08-01 DIAGNOSIS — I130 Hypertensive heart and chronic kidney disease with heart failure and stage 1 through stage 4 chronic kidney disease, or unspecified chronic kidney disease: Secondary | ICD-10-CM | POA: Diagnosis not present

## 2023-08-01 DIAGNOSIS — Z8249 Family history of ischemic heart disease and other diseases of the circulatory system: Secondary | ICD-10-CM

## 2023-08-01 DIAGNOSIS — J9811 Atelectasis: Secondary | ICD-10-CM | POA: Diagnosis not present

## 2023-08-01 DIAGNOSIS — I10 Essential (primary) hypertension: Secondary | ICD-10-CM | POA: Diagnosis not present

## 2023-08-01 DIAGNOSIS — E872 Acidosis, unspecified: Secondary | ICD-10-CM | POA: Diagnosis not present

## 2023-08-01 DIAGNOSIS — Z6828 Body mass index (BMI) 28.0-28.9, adult: Secondary | ICD-10-CM

## 2023-08-01 DIAGNOSIS — I517 Cardiomegaly: Secondary | ICD-10-CM | POA: Diagnosis not present

## 2023-08-01 DIAGNOSIS — R059 Cough, unspecified: Secondary | ICD-10-CM | POA: Diagnosis present

## 2023-08-01 DIAGNOSIS — Z8261 Family history of arthritis: Secondary | ICD-10-CM

## 2023-08-01 DIAGNOSIS — Z881 Allergy status to other antibiotic agents status: Secondary | ICD-10-CM

## 2023-08-01 DIAGNOSIS — I252 Old myocardial infarction: Secondary | ICD-10-CM

## 2023-08-01 DIAGNOSIS — I480 Paroxysmal atrial fibrillation: Secondary | ICD-10-CM | POA: Diagnosis present

## 2023-08-01 DIAGNOSIS — R918 Other nonspecific abnormal finding of lung field: Secondary | ICD-10-CM | POA: Diagnosis not present

## 2023-08-01 DIAGNOSIS — R279 Unspecified lack of coordination: Secondary | ICD-10-CM | POA: Diagnosis not present

## 2023-08-01 DIAGNOSIS — Z86718 Personal history of other venous thrombosis and embolism: Secondary | ICD-10-CM | POA: Diagnosis not present

## 2023-08-01 DIAGNOSIS — Z66 Do not resuscitate: Secondary | ICD-10-CM | POA: Diagnosis present

## 2023-08-01 DIAGNOSIS — N1832 Chronic kidney disease, stage 3b: Secondary | ICD-10-CM | POA: Diagnosis present

## 2023-08-01 DIAGNOSIS — Z7901 Long term (current) use of anticoagulants: Secondary | ICD-10-CM

## 2023-08-01 DIAGNOSIS — J189 Pneumonia, unspecified organism: Secondary | ICD-10-CM | POA: Diagnosis not present

## 2023-08-01 DIAGNOSIS — Z515 Encounter for palliative care: Secondary | ICD-10-CM | POA: Diagnosis not present

## 2023-08-01 DIAGNOSIS — Z880 Allergy status to penicillin: Secondary | ICD-10-CM

## 2023-08-01 DIAGNOSIS — Z743 Need for continuous supervision: Secondary | ICD-10-CM | POA: Diagnosis not present

## 2023-08-01 DIAGNOSIS — E663 Overweight: Secondary | ICD-10-CM | POA: Diagnosis present

## 2023-08-01 DIAGNOSIS — Z886 Allergy status to analgesic agent status: Secondary | ICD-10-CM

## 2023-08-01 LAB — BASIC METABOLIC PANEL
Anion gap: 10 (ref 5–15)
BUN: 30 mg/dL — ABNORMAL HIGH (ref 8–23)
CO2: 21 mmol/L — ABNORMAL LOW (ref 22–32)
Calcium: 8.8 mg/dL — ABNORMAL LOW (ref 8.9–10.3)
Chloride: 104 mmol/L (ref 98–111)
Creatinine, Ser: 1.65 mg/dL — ABNORMAL HIGH (ref 0.44–1.00)
GFR, Estimated: 29 mL/min — ABNORMAL LOW (ref >60)
Glucose, Bld: 108 mg/dL — ABNORMAL HIGH (ref 70–99)
Potassium: 3.4 mmol/L — ABNORMAL LOW (ref 3.5–5.1)
Sodium: 135 mmol/L (ref 135–145)

## 2023-08-01 LAB — CBC
HCT: 34 % — ABNORMAL LOW (ref 36.0–46.0)
Hemoglobin: 11.1 g/dL — ABNORMAL LOW (ref 12.0–15.0)
MCH: 29.1 pg (ref 26.0–34.0)
MCHC: 32.6 g/dL (ref 30.0–36.0)
MCV: 89 fL (ref 80.0–100.0)
Platelets: 184 10*3/uL (ref 150–400)
RBC: 3.82 MIL/uL — ABNORMAL LOW (ref 3.87–5.11)
RDW: 15.9 % — ABNORMAL HIGH (ref 11.5–15.5)
WBC: 4.5 10*3/uL (ref 4.0–10.5)
nRBC: 0 % (ref 0.0–0.2)

## 2023-08-01 NOTE — ED Triage Notes (Signed)
Pt comes from EMS from a facility, has been having generalized weakness and foul smelling urine for the past 3 days.

## 2023-08-02 ENCOUNTER — Inpatient Hospital Stay: Payer: Medicare HMO

## 2023-08-02 ENCOUNTER — Emergency Department: Payer: Medicare HMO

## 2023-08-02 ENCOUNTER — Inpatient Hospital Stay (HOSPITAL_COMMUNITY)
Admit: 2023-08-02 | Discharge: 2023-08-02 | Disposition: A | Discharge status: Home / Self Care | Payer: Medicare HMO | Attending: Internal Medicine

## 2023-08-02 ENCOUNTER — Inpatient Hospital Stay
Admission: EM | Admit: 2023-08-02 | Discharge: 2023-08-07 | DRG: 177 | Disposition: A | Discharge status: Skilled Nursing Facility | Payer: Medicare HMO | Attending: Internal Medicine | Admitting: Internal Medicine

## 2023-08-02 DIAGNOSIS — J189 Pneumonia, unspecified organism: Secondary | ICD-10-CM | POA: Insufficient documentation

## 2023-08-02 DIAGNOSIS — R1310 Dysphagia, unspecified: Secondary | ICD-10-CM | POA: Diagnosis present

## 2023-08-02 DIAGNOSIS — J90 Pleural effusion, not elsewhere classified: Secondary | ICD-10-CM | POA: Diagnosis not present

## 2023-08-02 DIAGNOSIS — J9811 Atelectasis: Secondary | ICD-10-CM | POA: Diagnosis not present

## 2023-08-02 DIAGNOSIS — J690 Pneumonitis due to inhalation of food and vomit: Secondary | ICD-10-CM | POA: Diagnosis present

## 2023-08-02 DIAGNOSIS — F32A Depression, unspecified: Secondary | ICD-10-CM | POA: Diagnosis present

## 2023-08-02 DIAGNOSIS — I5031 Acute diastolic (congestive) heart failure: Secondary | ICD-10-CM

## 2023-08-02 DIAGNOSIS — E876 Hypokalemia: Secondary | ICD-10-CM | POA: Diagnosis present

## 2023-08-02 DIAGNOSIS — Z7901 Long term (current) use of anticoagulants: Secondary | ICD-10-CM | POA: Diagnosis not present

## 2023-08-02 DIAGNOSIS — I5032 Chronic diastolic (congestive) heart failure: Secondary | ICD-10-CM | POA: Diagnosis present

## 2023-08-02 DIAGNOSIS — Z96653 Presence of artificial knee joint, bilateral: Secondary | ICD-10-CM | POA: Diagnosis present

## 2023-08-02 DIAGNOSIS — R29818 Other symptoms and signs involving the nervous system: Secondary | ICD-10-CM | POA: Diagnosis not present

## 2023-08-02 DIAGNOSIS — N1831 Chronic kidney disease, stage 3a: Secondary | ICD-10-CM | POA: Diagnosis present

## 2023-08-02 DIAGNOSIS — I517 Cardiomegaly: Secondary | ICD-10-CM | POA: Diagnosis not present

## 2023-08-02 DIAGNOSIS — F0150 Vascular dementia without behavioral disturbance: Secondary | ICD-10-CM | POA: Diagnosis present

## 2023-08-02 DIAGNOSIS — N1832 Chronic kidney disease, stage 3b: Secondary | ICD-10-CM | POA: Diagnosis present

## 2023-08-02 DIAGNOSIS — F05 Delirium due to known physiological condition: Secondary | ICD-10-CM | POA: Diagnosis present

## 2023-08-02 DIAGNOSIS — E872 Acidosis, unspecified: Secondary | ICD-10-CM | POA: Diagnosis present

## 2023-08-02 DIAGNOSIS — Z515 Encounter for palliative care: Secondary | ICD-10-CM | POA: Diagnosis not present

## 2023-08-02 DIAGNOSIS — E785 Hyperlipidemia, unspecified: Secondary | ICD-10-CM | POA: Diagnosis present

## 2023-08-02 DIAGNOSIS — G9341 Metabolic encephalopathy: Secondary | ICD-10-CM | POA: Diagnosis present

## 2023-08-02 DIAGNOSIS — R531 Weakness: Principal | ICD-10-CM

## 2023-08-02 DIAGNOSIS — R059 Cough, unspecified: Secondary | ICD-10-CM | POA: Diagnosis present

## 2023-08-02 DIAGNOSIS — Z66 Do not resuscitate: Secondary | ICD-10-CM | POA: Diagnosis present

## 2023-08-02 DIAGNOSIS — I480 Paroxysmal atrial fibrillation: Secondary | ICD-10-CM | POA: Diagnosis present

## 2023-08-02 DIAGNOSIS — Z8249 Family history of ischemic heart disease and other diseases of the circulatory system: Secondary | ICD-10-CM | POA: Diagnosis not present

## 2023-08-02 DIAGNOSIS — R4182 Altered mental status, unspecified: Secondary | ICD-10-CM | POA: Insufficient documentation

## 2023-08-02 DIAGNOSIS — F419 Anxiety disorder, unspecified: Secondary | ICD-10-CM | POA: Diagnosis present

## 2023-08-02 DIAGNOSIS — E860 Dehydration: Secondary | ICD-10-CM | POA: Diagnosis present

## 2023-08-02 DIAGNOSIS — R918 Other nonspecific abnormal finding of lung field: Secondary | ICD-10-CM | POA: Diagnosis not present

## 2023-08-02 DIAGNOSIS — E039 Hypothyroidism, unspecified: Secondary | ICD-10-CM | POA: Diagnosis present

## 2023-08-02 DIAGNOSIS — I2510 Atherosclerotic heart disease of native coronary artery without angina pectoris: Secondary | ICD-10-CM | POA: Diagnosis present

## 2023-08-02 DIAGNOSIS — Z86718 Personal history of other venous thrombosis and embolism: Secondary | ICD-10-CM | POA: Diagnosis not present

## 2023-08-02 DIAGNOSIS — I130 Hypertensive heart and chronic kidney disease with heart failure and stage 1 through stage 4 chronic kidney disease, or unspecified chronic kidney disease: Secondary | ICD-10-CM | POA: Diagnosis present

## 2023-08-02 DIAGNOSIS — R4701 Aphasia: Secondary | ICD-10-CM | POA: Diagnosis present

## 2023-08-02 LAB — URINALYSIS, ROUTINE W REFLEX MICROSCOPIC
Bilirubin Urine: NEGATIVE
Glucose, UA: NEGATIVE mg/dL
Hgb urine dipstick: NEGATIVE
Ketones, ur: NEGATIVE mg/dL
Leukocytes,Ua: NEGATIVE
Nitrite: NEGATIVE
Protein, ur: NEGATIVE mg/dL
Specific Gravity, Urine: 1.018 (ref 1.005–1.030)
pH: 5 (ref 5.0–8.0)

## 2023-08-02 LAB — ECHOCARDIOGRAM COMPLETE
AR max vel: 2.14 cm2
AV Peak grad: 10.5 mm[Hg]
Ao pk vel: 1.62 m/s
Area-P 1/2: 2.8 cm2
S' Lateral: 2.2 cm

## 2023-08-02 LAB — CBG MONITORING, ED: Glucose-Capillary: 102 mg/dL — ABNORMAL HIGH (ref 70–99)

## 2023-08-02 MED ORDER — SODIUM CHLORIDE 0.9 % IV SOLN
2.0000 g | INTRAVENOUS | Status: DC
  Administered 2023-08-03 – 2023-08-07 (×5): 2 g via INTRAVENOUS
  Filled 2023-08-02 (×5): qty 20

## 2023-08-02 MED ORDER — AZITHROMYCIN 500 MG PO TABS
500.0000 mg | ORAL_TABLET | Freq: Every day | ORAL | Status: DC
  Administered 2023-08-03 – 2023-08-04 (×2): 500 mg via ORAL
  Filled 2023-08-02 (×2): qty 1

## 2023-08-02 MED ORDER — SODIUM CHLORIDE 0.9 % IV SOLN: INTRAVENOUS | Status: AC

## 2023-08-02 MED ORDER — DOCUSATE SODIUM 100 MG PO CAPS
100.0000 mg | ORAL_CAPSULE | Freq: Two times a day (BID) | ORAL | Status: DC
  Administered 2023-08-02 – 2023-08-05 (×6): 100 mg via ORAL
  Filled 2023-08-02 (×7): qty 1

## 2023-08-02 MED ORDER — SODIUM CHLORIDE 0.9 % IV SOLN
1.0000 g | Freq: Once | INTRAVENOUS | Status: AC
  Administered 2023-08-02: 1 g via INTRAVENOUS
  Filled 2023-08-02: qty 10

## 2023-08-02 MED ORDER — LORAZEPAM 2 MG/ML IJ SOLN: 0.5000 mg | Freq: Four times a day (QID) | INTRAMUSCULAR | Status: DC | PRN

## 2023-08-02 MED ORDER — OLOPATADINE HCL 0.1 % OP SOLN
1.0000 [drp] | Freq: Two times a day (BID) | OPHTHALMIC | Status: DC | PRN
  Filled 2023-08-02: qty 5

## 2023-08-02 MED ORDER — ACETAMINOPHEN ER 650 MG PO TBCR: 650.0000 mg | EXTENDED_RELEASE_TABLET | Freq: Three times a day (TID) | ORAL | Status: DC | PRN

## 2023-08-02 MED ORDER — LORATADINE 10 MG PO TABS
10.0000 mg | ORAL_TABLET | Freq: Every day | ORAL | Status: DC
  Administered 2023-08-02 – 2023-08-07 (×6): 10 mg via ORAL
  Filled 2023-08-02 (×6): qty 1

## 2023-08-02 MED ORDER — SODIUM CHLORIDE 0.9 % IV SOLN
500.0000 mg | Freq: Once | INTRAVENOUS | Status: AC
  Administered 2023-08-02: 500 mg via INTRAVENOUS
  Filled 2023-08-02: qty 5

## 2023-08-02 MED ORDER — TRAZODONE HCL 50 MG PO TABS
50.0000 mg | ORAL_TABLET | Freq: Every day | ORAL | Status: DC
  Administered 2023-08-03 – 2023-08-06 (×3): 50 mg via ORAL
  Filled 2023-08-02 (×4): qty 1

## 2023-08-02 MED ORDER — CROMOLYN SODIUM 4 % OP SOLN
1.0000 [drp] | Freq: Four times a day (QID) | OPHTHALMIC | Status: DC
  Administered 2023-08-02 – 2023-08-07 (×13): 1 [drp] via OPHTHALMIC
  Filled 2023-08-02 (×3): qty 10

## 2023-08-02 MED ORDER — POLYETHYLENE GLYCOL 3350 17 G PO PACK
17.0000 g | PACK | Freq: Every day | ORAL | Status: DC | PRN
  Administered 2023-08-05 – 2023-08-07 (×2): 17 g via ORAL
  Filled 2023-08-02 (×2): qty 1

## 2023-08-02 MED ORDER — RIVAROXABAN 20 MG PO TABS
20.0000 mg | ORAL_TABLET | Freq: Every day | ORAL | Status: DC
Start: 2023-08-03 — End: 2023-08-03

## 2023-08-02 MED ORDER — ONDANSETRON HCL 4 MG PO TABS: 4.0000 mg | ORAL_TABLET | Freq: Four times a day (QID) | ORAL | Status: DC | PRN

## 2023-08-02 MED ORDER — ONDANSETRON HCL 4 MG/2ML IJ SOLN
4.0000 mg | Freq: Four times a day (QID) | INTRAMUSCULAR | Status: DC | PRN
Start: 2023-08-02 — End: 2023-08-07

## 2023-08-02 MED ORDER — FLUTICASONE PROPIONATE 50 MCG/ACT NA SUSP
1.0000 | Freq: Every day | NASAL | Status: DC
  Administered 2023-08-02 – 2023-08-07 (×5): 1 via NASAL
  Filled 2023-08-02 (×3): qty 16

## 2023-08-02 MED ORDER — METOPROLOL SUCCINATE ER 25 MG PO TB24
12.5000 mg | ORAL_TABLET | Freq: Every day | ORAL | Status: DC
  Administered 2023-08-03 – 2023-08-07 (×5): 12.5 mg via ORAL
  Filled 2023-08-02 (×5): qty 1

## 2023-08-02 NOTE — ED Provider Notes (Signed)
[facility] Provider Note    Event Date/Time   First MD Initiated Contact with Patient 08/02/23 0404     (approximate)   History   Weakness   HPI  Nicole Bishop is a 89 y.o. female who presents to the ED for evaluation of Weakness   Review of cardiology clinic visit from October.  Paroxysmal A-fib on Xarelto.Dementia.   Patient presents alongside her daughter for evaluation of a couple days of progressive generalized weakness, not walking.  Patient lives at home family takes care of her.  Typically ambulatory with a walker.  Daughter reports a couple minor falls in the past week that she minimizes.  1 time she slipped down onto her bottom and then "got right back up."  Patient is pleasantly demented and has no complaints.  History is limited.  She denies any pain   Physical Exam   Triage Vital Signs: ED Triage Vitals [08/01/23 2010]  Encounter Vitals Group     BP 116/76     Systolic BP Percentile      Diastolic BP Percentile      Pulse Rate 73     Resp 17     Temp 98.4 F (36.9 C)     Temp Source Oral     SpO2 96 %     Weight      Height      Head Circumference      Peak Flow      Pain Score      Pain Loc      Pain Education      Exclude from Growth Chart     Most recent vital signs: Vitals:   08/02/23 0500 08/02/23 0539  BP:  (!) 127/95  Pulse:  87  Resp: 20 (!) 82  Temp:  97.6 F (36.4 C)  SpO2:  96%    General: Awake, no distress.  CV:  Good peripheral perfusion.  Resp:  Normal effort.  Abd:  No distention.  MSK:  No deformity noted.  Palpation of all 4 extremities without signs of deformity, tenderness or trauma Neuro:  No focal deficits appreciated. Other:     ED Results / Procedures / Treatments   Labs (all labs ordered are listed, but only abnormal results are displayed) Labs Reviewed  BASIC METABOLIC PANEL - Abnormal; Notable for the following components:      Result Value   Potassium 3.4 (*)    CO2 21 (*)     Glucose, Bld 108 (*)    BUN 30 (*)    Creatinine, Ser 1.65 (*)    Calcium 8.8 (*)    GFR, Estimated 29 (*)    All other components within normal limits  CBC - Abnormal; Notable for the following components:   RBC 3.82 (*)    Hemoglobin 11.1 (*)    HCT 34.0 (*)    RDW 15.9 (*)    All other components within normal limits  URINALYSIS, ROUTINE W REFLEX MICROSCOPIC - Abnormal; Notable for the following components:   Color, Urine YELLOW (*)    APPearance HAZY (*)    Bacteria, UA RARE (*)    All other components within normal limits  CBG MONITORING, ED - Abnormal; Notable for the following components:   Glucose-Capillary 102 (*)    All other components within normal limits  CULTURE, BLOOD (SINGLE)    EKG A-fib with a rate of 70 bpm.  Normal axis and intervals.  No STEMI.  RADIOLOGY  CT head interpreted by me without evidence of acute intracranial pathology 1 view CXR with right basilar infiltrate  Official radiology report(s): DG Chest Portable 1 View Result Date: 08/02/2023 CLINICAL DATA:  Frequent falls and weakness. EXAM: PORTABLE CHEST 1 VIEW COMPARISON:  Portable chest 08/23/2021, and chest CT 04/30/2018 FINDINGS: The patient is rotated to the right. The heart is moderately enlarged and the aorta is tortuous and dilated with scattered calcification, exaggerating the mediastinum. There is also a large hiatal hernia. No vascular congestion is seen. There do appear to have developed small pleural effusions and there may be a hazy infiltrate beginning to form in the left base. There is also increased elevation of the right hemidiaphragm and increased opacity in the right base which could be due to atelectasis or consolidation. Additionally, there is fullness in the region of the right hilum which could be an asymmetrically enlarged right pulmonary artery or hilar mass. Remainder of the lungs are clear. Osteopenia and thoracic spondylosis. No new osseous findings. IMPRESSION: 1.  Increased elevation of the right hemidiaphragm and increased opacity in the right base which could be due to atelectasis or consolidation. 2. Fullness in the region of the right hilum which could be an asymmetrically enlarged right pulmonary artery or hilar mass. 3. Small pleural effusions and possible hazy infiltrate beginning to form in the left base. 4. Cardiomegaly and aortic atherosclerosis with uncoiling. 5. Large hiatal hernia. 6. Consider follow-up study with chest CT, preferably with contrast if possible. Electronically Signed   By: Almira Bar M.D.   On: 08/02/2023 06:16   CT HEAD WO CONTRAST ( ) Result Date: 08/02/2023 CLINICAL DATA:  89 year old female status post fall. Weakness and foul smelling urine. EXAM: CT HEAD WITHOUT CONTRAST TECHNIQUE: Contiguous axial images were obtained from the base of the skull through the vertex without intravenous contrast. RADIATION DOSE REDUCTION: This exam was performed according to the departmental dose-optimization program which includes automated exposure control, adjustment of the mA and/or kV according to patient size and/or use of iterative reconstruction technique. COMPARISON:  Head CT 08/23/2021. FINDINGS: Brain: Cerebral volume is stable and normal for age. No midline shift, ventriculomegaly, mass effect, evidence of mass lesion, intracranial hemorrhage or evidence of cortically based acute infarction. Gray-white differentiation is stable, normal for age aside from several small chronic right cerebellar infarcts best demonstrated on coronal images. No other convincing encephalomalacia. Vascular: Calcified atherosclerosis at the skull base. No suspicious intracranial vascular hyperdensity. Skull: Stable.  No acute osseous abnormality identified. Sinuses/Orbits: Visualized paranasal sinuses and mastoids are stable, generally well aerated although there is chronic maxillary mucoperiosteal thickening. Other: No acute orbit or scalp soft tissue finding.  IMPRESSION: 1. No acute intracranial abnormality or acute traumatic injury identified. 2. Stable non contrast CT appearance of the brain since last year with small chronic right cerebellar infarcts. Electronically Signed   By: Odessa Fleming M.D.   On: 08/02/2023 05:38    PROCEDURES and INTERVENTIONS:  .1-3 Lead EKG Interpretation  Performed by: Delton Prairie, MD Authorized by: Delton Prairie, MD     Interpretation: normal     ECG rate:  80   ECG rate assessment: normal     Rhythm: sinus rhythm     Ectopy: none     Conduction: normal     Medications  cefTRIAXone (ROCEPHIN) 1 g in sodium chloride 0.9 % 100 mL IVPB (has no administration in time range)  azithromycin (ZITHROMAX) 500 mg in sodium chloride 0.9 % 250 mL IVPB (has no administration  in time range)     IMPRESSION / MDM / ASSESSMENT AND PLAN / ED COURSE  I reviewed the triage vital signs and the nursing notes.  Differential diagnosis includes, but is not limited to, deconditioning, stroke or ICH, sepsis, UTI, dehydration  {Patient presents with symptoms of an acute illness or injury that is potentially life-threatening.  Patient presents from home with a few days of rapidly progressive weakness possibly related to community-acquired pneumonia and requiring admission.  Essentially normal vital signs on room air.  She is pleasantly demented without distress or complaints.  X-ray with a right basilar infiltrate and this fits the clinical picture of her rapid decline in just a couple days.  No signs of sepsis or SIRS criteria.  CKD near baseline.  Normal WBC.  Urine without infectious features.  We will start her on antibiotics and consult medicine for admission considering the degree of weakness that she is experiencing      FINAL CLINICAL IMPRESSION(S) / ED DIAGNOSES   Final diagnoses:  Generalized weakness  Community acquired pneumonia of right lower lobe of lung     Rx / DC Orders   ED Discharge Orders     None         Note:  This document was prepared using Dragon voice recognition software and may include unintentional dictation errors.   Delton Prairie, MD 08/02/23 [phone]

## 2023-08-02 NOTE — ED Notes (Signed)
Patient returned from MRI.

## 2023-08-02 NOTE — ED Notes (Signed)
Provider messaged to inform that nurses noticed pt has dysarthria and asymmetrical face, daughter states this is since last week and also R arm and leg weakness since 3-4 days. Pt is also leaning toward R.

## 2023-08-02 NOTE — ED Notes (Signed)
Patient transfer to MRI by MRI tech

## 2023-08-02 NOTE — ED Notes (Addendum)
Messaged pharmacy about Missing doses "Flonase" and Opticrom". Also meesaged about metoprolol needs to be verified.

## 2023-08-02 NOTE — Progress Notes (Signed)
PT Cancellation Note  Patient Details Name: Nicole Bishop MRN: [id number] DOB: 13-Jun-1931   Cancelled Treatment:    Reason Eval/Treat Not Completed:  (Consult received, chart reviewed.  Per OT, family requesting that therapy hold until next date.  Will continue efforts next date as medcially appropriate and available.)   Kalief Kattner H. Manson Passey, PT, DPT, NCS 08/02/23, 1:27 PM [phone]

## 2023-08-02 NOTE — Progress Notes (Signed)
  Echocardiogram 2D Echocardiogram has been performed.  Nicole Bishop 08/02/2023, 1:00 PM

## 2023-08-02 NOTE — Progress Notes (Signed)
OT Cancellation Note  Patient Details Name: Nicole Bishop MRN: [id number] DOB: 04-06-1931   Cancelled Treatment:    Reason Eval/Treat Not Completed: Patient at procedure or test/ unavailable;Patient declined, no reason specified. Attempted x2 to see to see pt for eval this PM. On first attempt pt with hospital staff undergoing ECHO. On second attempt pt politely declines, resting comfortably in bed, family at beside request return at later date/time for pt comfort. Will hold and re-attempt as available and pt medically appropriate for OT evaluation.   Rockney Ghee, M.S., OTR/L 08/02/23, 1:30 PM

## 2023-08-02 NOTE — Evaluation (Signed)
Clinical/Bedside Swallow Evaluation Patient Details  Name: Nicole Bishop MRN: [id number] Date of Birth: 05/06/1931  Today's Date: 08/02/2023 Time: SLP Start Time (ACUTE ONLY): 1255 SLP Stop Time (ACUTE ONLY): 1340 SLP Time Calculation (min) (ACUTE ONLY): 45 min  Past Medical History:  Past Medical History:  Diagnosis Date   Anemia    Anxiety    Arthritis    Chronic kidney disease    Coronary artery disease    Depression    Dizziness    Dyspnea    Dysrhythmia    gets tachy if anxious or excited   GERD (gastroesophageal reflux disease)    History of hiatal hernia    HOH (hard of hearing)    Hypertension    Hypothyroidism    IDA (iron deficiency anemia) 12/14/2014   Myocardial infarction [facility]) 1992   Renal insufficiency    Sleep apnea    Tremors of nervous system    Past Surgical History:  Past Surgical History:  Procedure Laterality Date   CATARACT EXTRACTION W/PHACO Left 07/15/2017   Procedure: CATARACT EXTRACTION PHACO AND INTRAOCULAR LENS PLACEMENT (IOC)-LEFT;  Surgeon: Galen Manila, MD;  Location: [facility] ORS;  Service: Ophthalmology;  Laterality: Left;  [place] 00.36.1 AP% 13.9 CDE 5.02 Fluid Pack lot # [id number]   CATARACT EXTRACTION W/PHACO Right 08/26/2017   Procedure: CATARACT EXTRACTION PHACO AND INTRAOCULAR LENS PLACEMENT (IOC);  Surgeon: Galen Manila, MD;  Location: [facility] ORS;  Service: Ophthalmology;  Laterality: Right;  [place] 00:41.0 AP% 15.3 CDE 6.27 FLUID PACK LOT # [id number]   CHOLECYSTECTOMY     COLONOSCOPY WITH PROPOFOL N/A 04/10/2015   Procedure: COLONOSCOPY WITH PROPOFOL;  Surgeon: Scot Jun, MD;  Location: [facility] ENDOSCOPY;  Service: Endoscopy;  Laterality: N/A;   ESOPHAGOGASTRODUODENOSCOPY (EGD) WITH PROPOFOL N/A 04/10/2015   Procedure: ESOPHAGOGASTRODUODENOSCOPY (EGD) WITH PROPOFOL;  Surgeon: Scot Jun, MD;  Location: [facility] ENDOSCOPY;  Service: Endoscopy;  Laterality: N/A;   fatty tumor removed on left shoulder     gallbladdedr removed      JOINT REPLACEMENT Bilateral    total knee replacements   REPLACEMENT TOTAL KNEE BILATERAL Bilateral    HPI:  Pt is a 89 y.o. female with medical history significant of Dementia, PAF and DVT on Xarelto, HTN, CAD, CKD stage IIIa, hypothyroidism, Hiatal Hernia, GERD, morbid Obesity, brought in by family member for generalized weakness, cough, confusion.     Daughter at bedside gave all history.  Patient started to have productive cough with occasional whitish phlegm production at least 1 week ago, and lasted 3 days patient became very weak and stayed in bed all the time and not eating and drinking as much.  Pt requires support w/ ADLs  in the home; uses a pill box per Family.  CT of Chest: Severe central pulmonary artery enlargement which seems  progressed since 2019, consistent with Pulmonary Artery  Hypertension. Underlying cardiomegaly, Aortic Atherosclerosis.  2. Asymmetric right lung atelectasis with dependent opacity  suspicious for acute lung infection. Associated thickening of the  right lower lung bronchi. Minimal retained secretions in the  trachea. Trace superimposed pleural fluid, layering more so in the  left lung.  3. Abdominal Aortic Aneurysm, estimated at 39 mm diameter. Recommend  follow-up ultrasound every 3 years.  4. Chronic Moderate size gastric Hiatal Hernia.  MRI: No evidence of acute abnormality.    Assessment / Plan / Recommendation  Clinical Impression   Pt seen for BSE today. Pt awake, verbally responded w/ soft, mumbled speech at times. She remained  bundled up under covers wanting to be fed. Sons present and encouraged oral intake during this evaluation. Pt has Baseline Dementia.  On RA, afebrile. WBC WNL.  Pt appears to present w/ grossly functional oropharyngeal phase swallowing w/ No overt oropharyngeal phase dysphagia noted, No neuromuscular deficits noted. Pt consumed few po trials w/ No overt, clinical s/s of aspiration during po trials.  Pt appears at reduced risk for  aspiration from an oropharyngeal phase standpoint when following general aspiration precautions w/ easy to eat, soft solids. However, pt does have challenging factors that could impact her oropharyngeal swallowing to include Baseline Dementia/Cognitive decline, Hiatal Hernia and GERD(ANY Esophageal Dysmotility or Regurgitation of Reflux material can increase risk for aspiration of the Reflux material during Retrograde flow thus impact Pulmonary status), deconditioning/weakness, and advanced age as well as current decline in status impacting self-feeding abilities. These factors can increase risk for aspiration, dysphagia as well as decreased oral intake overall.   During po trials, pt consumed consistencies given (w/ encouragement needed by SLP and Sons) w/ no overt coughing, decline in vocal quality, or change in respiratory presentation during/post trials. O2 sats remained 98%. Oral phase appeared grossly [facility] w/ timely bolus management, mastication, and control of bolus propulsion for A-P transfer for swallowing. Oral clearing achieved w/ all trial consistencies -- moistened, soft foods given. OM Exam(cursory) appeared [facility] w/ no unilateral weakness noted. Speech Clear though soft, mumbled. Pt was fed by Sons.   Recommend a more Mech Soft consistency diet for well-Cut meats/foods, moistened foods; Thin liquids -- monitor straw use, and pt should help to Hold Cup when drinking. Recommend general aspiration precautions - reduce distractions during meals, pt should sit up and assist in feed self. Support/Supervision at meals. REFLUX precautions. Pills WHOLE vs CRUSHED in Puree for safer, easier swallowing -- pt has help at home for her Pills per Son. Considering Pills in a Puree was was encourged now and for D/C to the Sons for safety reasons in setting of Cognitive decline and advanced age.  Education given on Pills in Puree; food consistencies and easy to eat options; general aspiration and REFLUX precautions  to pt and Sons. MD to reconsult if any new needs arise. NSG updated, agreed. MD updated. Recommend Dietician f/u for support. SLP Visit Diagnosis: Dysphagia, unspecified (R13.10) (baseline Hiatal Hernia; GERD; Dementia)    Aspiration Risk   (reduced following general aspiration precautions; REFLUX precautions.)    Diet Recommendation   Thin;Dysphagia 3 (mechanical soft) (chopped, moist foods for easy chewing) = a more Mech Soft consistency diet for well-Cut meats/foods, moistened foods; Thin liquids -- monitor straw use, and pt should help to Hold Cup when drinking. Recommend general aspiration precautions - reduce distractions during meals, pt should sit up and assist in feed self. Support/Supervision at meals. REFLUX precautions.   Medication Administration: Whole meds with puree (vs CRUSHED in puree as needed to per NSG)    Other  Recommendations Recommended Consults:  (Dietician f/u) Oral Care Recommendations: Oral care BID;Oral care before and after PO;Staff/trained caregiver to provide oral care    Recommendations for follow up therapy are one component of a multi-disciplinary discharge planning process, led by the attending physician.  Recommendations may be updated based on patient status, additional functional criteria and insurance authorization.  Follow up Recommendations No SLP follow up      Assistance Recommended at Discharge  FULL  Functional Status Assessment Patient has had a recent decline in their functional status and demonstrates the ability to  make significant improvements in function in a reasonable and predictable amount of time.  Frequency and Duration  (n/a)   (n/a)       Prognosis Prognosis for improved oropharyngeal function: Fair (-Good) Barriers to Reach Goals: Cognitive deficits;Time post onset;Severity of deficits;Behavior;Motivation Barriers/Prognosis Comment: baseline Hiatal Hernia; GERD; Dementia      Swallow Study   General Date of Onset:  08/01/23 HPI: Pt is a 89 y.o. female with medical history significant of Dementia, PAF and DVT on Xarelto, HTN, CAD, CKD stage IIIa, hypothyroidism, Hiatal Hernia, GERD, morbid Obesity, brought in by family member for generalized weakness, cough, confusion.     Daughter at bedside gave all history.  Patient started to have productive cough with occasional whitish phlegm production at least 1 week ago, and lasted 3 days patient became very weak and stayed in bed all the time and not eating and drinking as much.  Pt requires support w/ ADLs  in the home; uses a pill box per Family.  CT of Chest: Severe central pulmonary artery enlargement which seems  progressed since 2019, consistent with Pulmonary Artery  Hypertension. Underlying cardiomegaly, Aortic Atherosclerosis.  2. Asymmetric right lung atelectasis with dependent opacity  suspicious for acute lung infection. Associated thickening of the  right lower lung bronchi. Minimal retained secretions in the  trachea. Trace superimposed pleural fluid, layering more so in the  left lung.  3. Abdominal Aortic Aneurysm, estimated at 39 mm diameter. Recommend  follow-up ultrasound every 3 years.  4. Chronic Moderate size gastric Hiatal Hernia.  MRI: No evidence of acute abnormality. Type of Study: Bedside Swallow Evaluation Previous Swallow Assessment: none Diet Prior to this Study: Thin liquids (Level 0);Dysphagia 3 (mechanical soft) (ordered) Temperature Spikes Noted: No (wbc 4.5) Respiratory Status: Room air History of Recent Intubation: No Behavior/Cognition: Alert;Cooperative;Pleasant mood;Confused;Distractible;Requires cueing (baseline Dementia) Oral Cavity Assessment: Within Functional Limits Oral Care Completed by SLP: Recent completion by staff Oral Cavity - Dentition: Dentures, top;Dentures, bottom (in place) Vision:  (n/a) Self-Feeding Abilities: Total assist (preferred to be fed) Patient Positioning: Upright in bed (needed positioning) Baseline  Vocal Quality: Low vocal intensity Volitional Cough: Cognitively unable to elicit Volitional Swallow: Unable to elicit    Oral/Motor/Sensory Function Overall Oral Motor/Sensory Function: Within functional limits   Ice Chips Ice chips: Not tested   Thin Liquid Thin Liquid: Within functional limits Presentation: Straw (fed by Sons: ~10 trials) Other Comments: water, then cranberry juice    Nectar Thick Nectar Thick Liquid: Not tested   Honey Thick Honey Thick Liquid: Not tested   Puree Puree: Not tested   Solid     Solid: Within functional limits (softened solid trial) Presentation: Spoon (fed) Other Comments: no further accepted        Jerilynn Som, MS, CCC-SLP Speech Language Pathologist Rehab Services; [facility] [place] [phone] (ascom) Jalayia Bagheri 08/02/2023,2:53 PM

## 2023-08-02 NOTE — H&P (Signed)
History and Physical    Nicole Bishop XBJ:[id number] DOB: 22-Nov-1930 DOA: 08/02/2023  PCP: Barbette Reichmann, MD (Confirm with patient/family/NH records and if not entered, this has to be entered at [facility] point of entry) Patient coming from: Home  I have personally briefly reviewed patient's old medical records in [facility]  Chief Complaint: Confusion, general weakness, slurred speech  HPI: Nicole Bishop is a 89 y.o. female with medical history significant of PAF and DVT on Xarelto, HTN, CAD, CKD stage IIIa, dementia, hypothyroidism, GERD, morbid obesity, brought in by family member for generalized weakness, cough, confusion.  Daughter at bedside gave all history.  Patient started to have productive cough with occasional whitish phlegm production at least 1 week ago, and lasted 3 days patient became very weak and stayed in bed all the time and now eating and drinking much.  But denied any nauseous vomiting or diarrhea and no fever.  Yesterday, family also noticed the patient is confused and mumbling " hard to understand what she is talking about".  ED Course: Afebrile, nontachycardic nonhypotensive nonhypoxic.  Blood work showed WBC 4.5, hemoglobin 11.1, creatinine 1.6 compared to baseline 1.4-1.7, K3.4, bicarb 21.  CT head showed no acute findings, chest x-ray concerned about right lower lobe consolidation.  Patient was started on IV fluid and ceftriaxone and azithromycin  Review of Systems: Unable to perform, patient is confused  Past Medical History:  Diagnosis Date   Anemia    Anxiety    Arthritis    Chronic kidney disease    Coronary artery disease    Depression    Dizziness    Dyspnea    Dysrhythmia    gets tachy if anxious or excited   GERD (gastroesophageal reflux disease)    History of hiatal hernia    HOH (hard of hearing)    Hypertension    Hypothyroidism    IDA (iron deficiency anemia) 12/14/2014   Myocardial infarction [facility] 1992   Renal insufficiency     Sleep apnea    Tremors of nervous system     Past Surgical History:  Procedure Laterality Date   CATARACT EXTRACTION W/PHACO Left 07/15/2017   Procedure: CATARACT EXTRACTION PHACO AND INTRAOCULAR LENS PLACEMENT (IOC)-LEFT;  Surgeon: Galen Manila, MD;  Location: [facility] ORS;  Service: Ophthalmology;  Laterality: Left;  [place] 00.36.1 AP% 13.9 CDE 5.02 Fluid Pack lot # [id number]   CATARACT EXTRACTION W/PHACO Right 08/26/2017   Procedure: CATARACT EXTRACTION PHACO AND INTRAOCULAR LENS PLACEMENT (IOC);  Surgeon: Galen Manila, MD;  Location: [facility] ORS;  Service: Ophthalmology;  Laterality: Right;  [place] 00:41.0 AP% 15.3 CDE 6.27 FLUID PACK LOT # [id number]   CHOLECYSTECTOMY     COLONOSCOPY WITH PROPOFOL N/A 04/10/2015   Procedure: COLONOSCOPY WITH PROPOFOL;  Surgeon: Scot Jun, MD;  Location: [facility] ENDOSCOPY;  Service: Endoscopy;  Laterality: N/A;   ESOPHAGOGASTRODUODENOSCOPY (EGD) WITH PROPOFOL N/A 04/10/2015   Procedure: ESOPHAGOGASTRODUODENOSCOPY (EGD) WITH PROPOFOL;  Surgeon: Scot Jun, MD;  Location: [facility] ENDOSCOPY;  Service: Endoscopy;  Laterality: N/A;   fatty tumor removed on left shoulder     gallbladdedr removed     JOINT REPLACEMENT Bilateral    total knee replacements   REPLACEMENT TOTAL KNEE BILATERAL Bilateral      reports that she has never smoked. She has never used smokeless tobacco. She reports that she does not drink alcohol and does not use drugs.  Allergies  Allergen Reactions   Penicillins Rash and Other (See Comments)    Has  patient had a PCN reaction causing immediate rash, facial/tongue/throat swelling, SOB or lightheadedness with hypotension: Unknown Has patient had a PCN reaction causing severe rash involving mucus membranes or skin necrosis: Unknown Has patient had a PCN reaction that required hospitalization: Unknown Has patient had a PCN reaction occurring within the last 10 years: Unknown If all of the above answers are "NO", then may proceed with  Cephalosporin use.    Nsaids Other (See Comments)    GI upset   Rosuvastatin Other (See Comments)   Atorvastatin Rash   Celebrex [Celecoxib] Other (See Comments)    Constipation and stomach upset   Felodipine Nausea Only   Oxaprozin Nausea Only   Rofecoxib Other (See Comments)    GI upset   Simvastatin Rash    Family History  Problem Relation Age of Onset   Arthritis Mother    Hypertension Mother    Stroke Mother    Hypertension Father    Heart attack Father    Breast cancer Neg Hx      Prior to Admission medications   Medication Sig Start Date End Date Taking? Authorizing Provider  acetaminophen (TYLENOL) 650 MG CR tablet Take 650 mg by mouth every 8 (eight) hours as needed for pain.    [provider]  amLODipine (NORVASC) 10 MG tablet  12/28/20   [provider]  amLODipine (NORVASC) 5 MG tablet Take 5 mg by mouth daily. Patient not taking: Reported on 11/28/2021    [provider]  calcitRIOL (ROCALTROL) 0.25 MCG capsule Take 1 capsule by mouth daily. 09/25/17   [provider]  Cholecalciferol (VITAMIN D3) 2000 units capsule Take 4,000 Units by mouth daily.    [provider]  cromolyn (OPTICROM) 4 % ophthalmic solution SMARTSIG:In Eye(s) 01/12/20   [provider]  docusate sodium (COLACE) 100 MG capsule Take 100 mg by mouth 2 (two) times daily. 06/03/14   [provider]  Ferrous Sulfate 142 (45 Fe) MG TBCR Take 1 tablet by mouth 2 (two) times daily.     [provider]  loratadine (CLARITIN) 10 MG tablet Take 1 tablet (10 mg total) by mouth daily. 02/12/13   Shelia Media, MD  metoprolol succinate (TOPROL-XL) 25 MG 24 hr tablet Take 12.5 mg by mouth daily.    [provider]  mometasone (NASONEX) 50 MCG/ACT nasal spray Place 2 sprays into the nose daily as needed (allergies).    [provider]  olopatadine (PATANOL) 0.1 % ophthalmic solution Place 1 drop into both eyes 2 (two)  times daily as needed for allergies.     [provider]  polyethylene glycol (MIRALAX / GLYCOLAX) packet Take 17 g by mouth daily as needed for mild constipation or moderate constipation.    [provider]  rivaroxaban (XARELTO) 20 MG TABS tablet Take 1 tablet (20 mg total) by mouth daily with supper. 06/30/18   Adrian Saran, MD  traZODone (DESYREL) 50 MG tablet Take 50 mg by mouth at bedtime. 04/30/23   [provider]    Physical Exam: Vitals:   08/02/23 0700 08/02/23 0730 08/02/23 0830 08/02/23 0947  BP: 128/74 132/75 127/73   Pulse: 67 68 64   Resp: 16 18 19    Temp:    97.8 F (36.6 C)  TempSrc:    Axillary  SpO2: 97% 97% 96%     Constitutional: NAD, calm, comfortable Vitals:   08/02/23 0700 08/02/23 0730 08/02/23 0830 08/02/23 0947  BP: 128/74 132/75 127/73  Pulse: 67 68 64   Resp: 16 18 19    Temp:    97.8 F (36.6 C)  TempSrc:    Axillary  SpO2: 97% 97% 96%    Eyes: PERRL, lids and conjunctivae normal ENMT: Mucous membranes are dry Posterior pharynx clear of any exudate or lesions.Normal dentition.  Neck: normal, supple, no masses, no thyromegaly Respiratory: clear to auscultation bilaterally, no wheezing, no crackles. Normal respiratory effort. No accessory muscle use.  Cardiovascular: Regular rate and rhythm, no murmurs / rubs / gallops. No extremity edema. 2+ pedal pulses. No carotid bruits.  Abdomen: no tenderness, no masses palpated. No hepatosplenomegaly. Bowel sounds positive.  Musculoskeletal: no clubbing / cyanosis. No joint deformity upper and lower extremities. Good ROM, no contractures. Normal muscle tone.  Skin: no rashes, lesions, ulcers. No induration Neurologic: CN 2-12 grossly intact. Sensation intact, DTR normal. Strength 5/5 in all 4.  Speech is slurred Psychiatric: Awake, oriented to herself, confused about time and place   Labs on Admission: I have personally reviewed following labs and imaging studies  CBC: Recent  Labs  Lab 08/01/23 2017  WBC 4.5  HGB 11.1*  HCT 34.0*  MCV 89.0  PLT 184   Basic Metabolic Panel: Recent Labs  Lab 08/01/23 2017  NA 135  K 3.4*  CL 104  CO2 21*  GLUCOSE 108*  BUN 30*  CREATININE 1.65*  CALCIUM 8.8*   GFR: CrCl cannot be calculated (Unknown ideal weight.). Liver Function Tests: No results for input(s): "AST", "ALT", "ALKPHOS", "BILITOT", "PROT", "ALBUMIN" in the last 168 hours. No results for input(s): "LIPASE", "AMYLASE" in the last 168 hours. No results for input(s): "AMMONIA" in the last 168 hours. Coagulation Profile: No results for input(s): "INR", "PROTIME" in the last 168 hours. Cardiac Enzymes: No results for input(s): "CKTOTAL", "CKMB", "CKMBINDEX", "TROPONINI" in the last 168 hours. BNP (last 3 results) No results for input(s): "PROBNP" in the last 8760 hours. HbA1C: No results for input(s): "HGBA1C" in the last 72 hours. CBG: Recent Labs  Lab 08/02/23 0533  GLUCAP 102*   Lipid Profile: No results for input(s): "CHOL", "HDL", "LDLCALC", "TRIG", "CHOLHDL", "LDLDIRECT" in the last 72 hours. Thyroid Function Tests: No results for input(s): "TSH", "T4TOTAL", "FREET4", "T3FREE", "THYROIDAB" in the last 72 hours. Anemia Panel: No results for input(s): "VITAMINB12", "FOLATE", "FERRITIN", "TIBC", "IRON", "RETICCTPCT" in the last 72 hours. Urine analysis:    Component Value Date/Time   COLORURINE YELLOW (A) 08/02/2023 0423   APPEARANCEUR HAZY (A) 08/02/2023 0423   LABSPEC 1.018 08/02/2023 0423   PHURINE 5.0 08/02/2023 0423   GLUCOSEU NEGATIVE 08/02/2023 0423   HGBUR NEGATIVE 08/02/2023 0423   BILIRUBINUR NEGATIVE 08/02/2023 0423   KETONESUR NEGATIVE 08/02/2023 0423   PROTEINUR NEGATIVE 08/02/2023 0423   NITRITE NEGATIVE 08/02/2023 0423   LEUKOCYTESUR NEGATIVE 08/02/2023 0423    Radiological Exams on Admission: CT CHEST WO CONTRAST Result Date: 08/02/2023 CLINICAL DATA:  88 year old female status post fall. Weakness and foul  smelling urine. EXAM: CT CHEST WITHOUT CONTRAST TECHNIQUE: Multidetector CT imaging of the chest was performed following the standard protocol without IV contrast. RADIATION DOSE REDUCTION: This exam was performed according to the departmental dose-optimization program which includes automated exposure control, adjustment of the mA and/or kV according to patient size and/or use of iterative reconstruction technique. COMPARISON:  Portable chest x-ray today.  Chest CT 04/30/2018. FINDINGS: Cardiovascular: Chronic cardiomegaly, moderate to severe enlargement of the central pulmonary arteries which may have progressed since 2019 (series 2, image 65). No pericardial effusion.  Superimposed tortuous thoracic aorta with calcified atherosclerosis. Vascular patency is not evaluated in the absence of IV contrast. Mediastinum/Nodes: No mediastinal mass or lymphadenopathy. Moderate size gastric hiatal hernia is stable since 2019. Lungs/Pleura: Major airways remain patent with minimal retained secretions layering in the trachea on series 3, image 43. Asymmetrically decreased right lung volume since 2019 with confluent mostly dependent right lung opacity which is maximal in the lower lobe. Airway thickening in that region, but no large airway occlusion. No pneumothorax. Trace if any pleural fluid in the right lung. Trace layering left pleural effusion, but otherwise the left lung is well aerated. Upper Abdomen: Partially visible juxtarenal or infrarenal abdominal aortic aneurysm, estimated at 39 mm diameter on sagittal and coronal reformatted images. This was not visible in 2019. Negative visible noncontrast liver, spleen, pancreas, adrenal glands, intra-abdominal stomach. Generalized large bowel diverticulosis. Renal vascular calcifications but also superimposed nephrolithiasis including developing staghorn calculi in the right kidney. No hydronephrosis is visible. No upper abdominal free air or free fluid. Musculoskeletal: Bulky  spinal disc and endplate degeneration throughout. No acute or suspicious osseous lesion identified. IMPRESSION: 1. Severe central pulmonary artery enlargement which seems progressed since 2019, consistent with Pulmonary Artery Hypertension. Underlying cardiomegaly, Aortic Atherosclerosis (ICD10-I70.0). 2. Asymmetric right lung atelectasis with dependent opacity suspicious for acute lung infection. Associated thickening of the right lower lung bronchi. Minimal retained secretions in the trachea. Trace superimposed pleural fluid, layering more so in the left lung. 3. Abdominal Aortic Aneurysm, estimated at 39 mm diameter. Recommend follow-up ultrasound every 3 years. (Ref.: J Vasc Surg. 2018; 67:2-77 and J Am Coll Radiol 2013;10(10):789-794.) Aortic aneurysm NOS (ICD10-I71.9). 4. Chronic moderate size gastric hiatal hernia. Right renal staghorn calculi. Electronically Signed   By: Odessa Fleming M.D.   On: 08/02/2023 09:19   DG Chest Portable 1 View Result Date: 08/02/2023 CLINICAL DATA:  Frequent falls and weakness. EXAM: PORTABLE CHEST 1 VIEW COMPARISON:  Portable chest 08/23/2021, and chest CT 04/30/2018 FINDINGS: The patient is rotated to the right. The heart is moderately enlarged and the aorta is tortuous and dilated with scattered calcification, exaggerating the mediastinum. There is also a large hiatal hernia. No vascular congestion is seen. There do appear to have developed small pleural effusions and there may be a hazy infiltrate beginning to form in the left base. There is also increased elevation of the right hemidiaphragm and increased opacity in the right base which could be due to atelectasis or consolidation. Additionally, there is fullness in the region of the right hilum which could be an asymmetrically enlarged right pulmonary artery or hilar mass. Remainder of the lungs are clear. Osteopenia and thoracic spondylosis. No new osseous findings. IMPRESSION: 1. Increased elevation of the right  hemidiaphragm and increased opacity in the right base which could be due to atelectasis or consolidation. 2. Fullness in the region of the right hilum which could be an asymmetrically enlarged right pulmonary artery or hilar mass. 3. Small pleural effusions and possible hazy infiltrate beginning to form in the left base. 4. Cardiomegaly and aortic atherosclerosis with uncoiling. 5. Large hiatal hernia. 6. Consider follow-up study with chest CT, preferably with contrast if possible. Electronically Signed   By: Almira Bar M.D.   On: 08/02/2023 06:16   CT HEAD WO CONTRAST ( ) Result Date: 08/02/2023 CLINICAL DATA:  89 year old female status post fall. Weakness and foul smelling urine. EXAM: CT HEAD WITHOUT CONTRAST TECHNIQUE: Contiguous axial images were obtained from the base of the skull through the vertex without  intravenous contrast. RADIATION DOSE REDUCTION: This exam was performed according to the departmental dose-optimization program which includes automated exposure control, adjustment of the mA and/or kV according to patient size and/or use of iterative reconstruction technique. COMPARISON:  Head CT 08/23/2021. FINDINGS: Brain: Cerebral volume is stable and normal for age. No midline shift, ventriculomegaly, mass effect, evidence of mass lesion, intracranial hemorrhage or evidence of cortically based acute infarction. Gray-white differentiation is stable, normal for age aside from several small chronic right cerebellar infarcts best demonstrated on coronal images. No other convincing encephalomalacia. Vascular: Calcified atherosclerosis at the skull base. No suspicious intracranial vascular hyperdensity. Skull: Stable.  No acute osseous abnormality identified. Sinuses/Orbits: Visualized paranasal sinuses and mastoids are stable, generally well aerated although there is chronic maxillary mucoperiosteal thickening. Other: No acute orbit or scalp soft tissue finding. IMPRESSION: 1. No acute  intracranial abnormality or acute traumatic injury identified. 2. Stable non contrast CT appearance of the brain since last year with small chronic right cerebellar infarcts. Electronically Signed   By: Odessa Fleming M.D.   On: 08/02/2023 05:38    EKG: Independently reviewed.  Rate controlled A-fib, no acute ST changes.  Assessment/Plan Principal Problem:   Pneumonia Active Problems:   CAP (community acquired pneumonia)   AMS (altered mental status)  (please populate well all problems here in Problem List. (For example, if patient is on BP meds at home and you resume or decide to hold them, it is a problem that needs to be her. Same for CAD, COPD, HLD and so on)  Acute metabolic encephalopathy -Probably secondary to right lower lobe pneumonia.  CT chest confirmed that right lower lobe infiltrates and partial consolidation.  Plan to continue ceftriaxone and azithromycin -CT head reassuring.  Will order MRI to rule out stroke given significant slurred speech.  Acute/subacute aphasia -MRI to rule out stroke.  ED nurse also reported slight right-sided facial droop and right-sided weakness, however on my neuroexam I do not appreciate significant facial droop or any one-sided weakness. -Brain MRI -And PT OT evaluation.  Dehydration CKD stage IIIa -IV hydration then reevaluate  RLL pneumonia -Antibiotics -Speech evaluation as patient family reported the patient has occasional cough and choking after eating meals. -Aspiration precaution  PAF -Rate controlled, continue Xarelto  History of DVT -Continue Xarelto  HTN -Hold off amlodipine, continue metoprolol  DVT prophylaxis: Xarelto Code Status: Full code Family Communication: Daughter at bedside Disposition Plan: Patient is sick with multiple acute conditions including pneumonia, possible stroke, requiring IV antibiotics and inpatient stroke workup, expect more than 2 midnight hospital stay Consults called: None Admission status:  Telemetry admission   Emeline General MD [facility] Pager [phone]  08/02/2023, 10:13 AM

## 2023-08-03 DIAGNOSIS — G9341 Metabolic encephalopathy: Secondary | ICD-10-CM | POA: Diagnosis not present

## 2023-08-03 DIAGNOSIS — J189 Pneumonia, unspecified organism: Secondary | ICD-10-CM | POA: Diagnosis not present

## 2023-08-03 DIAGNOSIS — I480 Paroxysmal atrial fibrillation: Secondary | ICD-10-CM

## 2023-08-03 LAB — CBC
HCT: 33.3 % — ABNORMAL LOW (ref 36.0–46.0)
Hemoglobin: 10.8 g/dL — ABNORMAL LOW (ref 12.0–15.0)
MCH: 28.7 pg (ref 26.0–34.0)
MCHC: 32.4 g/dL (ref 30.0–36.0)
MCV: 88.6 fL (ref 80.0–100.0)
Platelets: 151 10*3/uL (ref 150–400)
RBC: 3.76 MIL/uL — ABNORMAL LOW (ref 3.87–5.11)
RDW: 15.7 % — ABNORMAL HIGH (ref 11.5–15.5)
WBC: 4.6 10*3/uL (ref 4.0–10.5)
nRBC: 0 % (ref 0.0–0.2)

## 2023-08-03 LAB — BASIC METABOLIC PANEL
Anion gap: 9 (ref 5–15)
BUN: 29 mg/dL — ABNORMAL HIGH (ref 8–23)
CO2: 24 mmol/L (ref 22–32)
Calcium: 8.7 mg/dL — ABNORMAL LOW (ref 8.9–10.3)
Chloride: 107 mmol/L (ref 98–111)
Creatinine, Ser: 1.35 mg/dL — ABNORMAL HIGH (ref 0.44–1.00)
GFR, Estimated: 37 mL/min — ABNORMAL LOW (ref >60)
Glucose, Bld: 94 mg/dL (ref 70–99)
Potassium: 3.4 mmol/L — ABNORMAL LOW (ref 3.5–5.1)
Sodium: 140 mmol/L (ref 135–145)

## 2023-08-03 MED ORDER — RIVAROXABAN 15 MG PO TABS
15.0000 mg | ORAL_TABLET | Freq: Every day | ORAL | Status: DC
  Administered 2023-08-03 – 2023-08-07 (×5): 15 mg via ORAL
  Filled 2023-08-03 (×7): qty 1

## 2023-08-03 MED ORDER — POTASSIUM CHLORIDE CRYS ER 20 MEQ PO TBCR
40.0000 meq | EXTENDED_RELEASE_TABLET | Freq: Once | ORAL | Status: AC
  Administered 2023-08-03: 40 meq via ORAL
  Filled 2023-08-03: qty 2

## 2023-08-03 MED ORDER — ACETAMINOPHEN 325 MG PO TABS
650.0000 mg | ORAL_TABLET | Freq: Three times a day (TID) | ORAL | Status: DC | PRN
  Administered 2023-08-03 – 2023-08-04 (×2): 650 mg via ORAL
  Filled 2023-08-03 (×3): qty 2

## 2023-08-03 NOTE — Evaluation (Signed)
Physical Therapy Evaluation Patient Details Name: CORISHA WESTLING MRN: [id number] DOB: 1931/03/03 Today's Date: 08/03/2023  History of Present Illness  DOMNIQUE RAYSOR is a 89 y.o. female with medical history significant of PAF and DVT on Xarelto, HTN, CAD, CKD stage IIIa, dementia, hypothyroidism, GERD, morbid obesity, brought in by family member for generalized weakness, cough, confusion.  Admitted for management of acute metabolic encephalopathy due to R LL PNA  Clinical Impression  Patient sleeping soundly upon arrival to room, does awaken to son's voice/touch.  Initially requires consistent stimulation to maintain alertness, but improves with lights on, change of position, increased interaction.  Patient oriented to self only; difficulty following simple commands.  Endorses generalized pain throughout body, demonstrating moderate grimacing, moaning with any/all functional movement patterns (FACES 8/10); meds requested per primary RN. Globally weak and deconditioned throughout all extremities, requiring passive/act assist for movement throughout limited range (limited by pain).  Persistently lists to R, even in supported sitting positions; dep assist to correct and reposition. Anticipate heavy +2 for progression of mobility; however, unable to tolerate this AM due to pain. Did reposition (with pillow support) to midline and transitioned to chair position in bed for improved pulmonary hygiene, tolerance to upright, alertness and skin protection; tolerating well.  Will continue to assess/progress mobility as appropriate in subsequent sessions. Would benefit from skilled PT to address above deficits and promote optimal return to PLOF.; recommend post-acute PT follow up as indicated by interdisciplinary care team.            If plan is discharge home, recommend the following: Two people to help with walking and/or transfers;Two people to help with bathing/dressing/bathroom   Can travel by private  vehicle        Equipment Recommendations    Recommendations for Other Services       Functional Status Assessment Patient has had a recent decline in their functional status and demonstrates the ability to make significant improvements in function in a reasonable and predictable amount of time.     Precautions / Restrictions Precautions Precautions: Fall Restrictions Weight Bearing Restrictions Per Provider Order: No      Mobility  Bed Mobility Overal bed mobility: Needs Assistance             General bed mobility comments: TOTAL A to bring into mid-line position in bed this date (consistently lists R). Pt significantly limited by lethargy/pain with mobility attempts.    Transfers                   General transfer comment: NT for pt safety/comfort    Ambulation/Gait               General Gait Details: unsafe/unable  Stairs            Wheelchair Mobility     Tilt Bed    Modified Rankin (Stroke Patients Only)       Balance                                             Pertinent Vitals/Pain Pain Assessment Pain Assessment: Faces Faces Pain Scale: Hurts whole lot Pain Location: generalized aches, soreness throughout Pain Descriptors / Indicators: Guarding, Grimacing, Sore Pain Intervention(s): Limited activity within patient's tolerance, Monitored during session, Repositioned, Patient requesting pain meds-RN notified    Home Living Family/patient expects to be discharged to::  Private residence Living Arrangements: Children Available Help at Discharge: Family;Available 24 hours/day Type of Home: Apartment Home Access: Elevator       Home Layout: One level Home Equipment: Rollator (4 wheels);Tub bench;Wheelchair - manual      Prior Function Prior Level of Function : Needs assist       Physical Assist : ADLs (physical);Mobility (physical) Mobility (physical): Transfers ADLs (physical):  Bathing;Dressing;IADLs Mobility Comments: Per son pt was amb with a 4WW ~ 1 month PTA, has recently required assistance with family to SPT to a WC for mobility. Denies falls history. ADLs Comments: Son reports family rotates to assist with care. Pt has required increased assistance for ADL management over the last month. Was initially able to use a tub bench to transfer to shower, however, has recently required sponge baths while seated in her WC 2/2 safety concerns. Requires assistance with LB dressing. Family completes IADL management.     Extremity/Trunk Assessment   Upper Extremity Assessment Upper Extremity Assessment: Generalized weakness (grossly at least 2-/5 throughout; difficulty to fully assess due to lethargy and generalized pain.  L thumb MCP red, edematous; RN informed/aware (gout?)) LUE Deficits / Details: LUE notably swollen/red at 1st MCP, very sensitive to touch. Pt also states L elbow is painful. Adjusted BP cuff for improved comfort. LUE Coordination: decreased gross motor;decreased fine motor    Lower Extremity Assessment Lower Extremity Assessment: Generalized weakness (minimal active movement noted in bilat LEs, largely limited by pain.  Rests in windswept to R position; limited tolerance for ROM assessment this date due to pain)       Communication   Communication Communication: Hearing impairment Cueing Techniques: Verbal cues;Gestural cues;Tactile cues  Cognition Arousal: Lethargic Behavior During Therapy: WFL for tasks assessed/performed, Flat affect Overall Cognitive Status: Impaired/Different from baseline Area of Impairment: Following commands, Awareness, Orientation, Safety/judgement                 Orientation Level: Disoriented to, Place, Time, Situation     Following Commands: Follows one step commands inconsistently Safety/Judgement: Decreased awareness of safety, Decreased awareness of deficits     General Comments: Per son, generally more  alert/awake. She is usually oriented to place, can identify family members, etc.        General Comments      Exercises Other Exercises Other Exercises: Attempted passive/act assist ROM, but patient with limited tolerance due to pain; meds requested per RN Other Exercises: Pillows placed under R hip/shoulder to promote midline orientation and more neutral resting position; tolerating well. Other Exercises: Transitioned to chair position in bed for pulmonary hygiene, progressive tolerance to upright; tolerating well   Assessment/Plan    PT Assessment Patient needs continued PT services  PT Problem List Decreased strength;Decreased range of motion;Decreased activity tolerance;Decreased balance;Decreased mobility;Decreased coordination;Decreased cognition;Decreased knowledge of use of DME;Decreased safety awareness;Decreased knowledge of precautions;Cardiopulmonary status limiting activity       PT Treatment Interventions DME instruction;Cognitive remediation;Gait training;Functional mobility training;Therapeutic activities;Balance training;Therapeutic exercise;Neuromuscular re-education;Patient/family education    PT Goals (Current goals can be found in the Care Plan section)  Acute Rehab PT Goals Patient Stated Goal: per son, intersted in considering STR if appropriate PT Goal Formulation: Patient unable to participate in goal setting Time For Goal Achievement: 08/17/23 Potential to Achieve Goals: Fair    Frequency Min 1X/week     Co-evaluation   Reason for Co-Treatment: Complexity of the patient's impairments (multi-system involvement) PT goals addressed during session: Mobility/safety with mobility OT goals addressed during session:  ADL's and self-care       AM-PAC PT "6 Clicks" Mobility  Outcome Measure Help needed turning from your back to your side while in a flat bed without using bedrails?: Total Help needed moving from lying on your back to sitting on the side of a  flat bed without using bedrails?: Total Help needed moving to and from a bed to a chair (including a wheelchair)?: Total Help needed standing up from a chair using your arms (e.g., wheelchair or bedside chair)?: Total Help needed to walk in hospital room?: Total Help needed climbing 3-5 steps with a railing? : Total 6 Click Score: 6    End of Session   Activity Tolerance: Patient limited by lethargy;Patient limited by pain Patient left: in bed;with bed alarm set;with family/visitor present Nurse Communication: Mobility status PT Visit Diagnosis: Muscle weakness (generalized) (M62.81);Difficulty in walking, not elsewhere classified (R26.2)    Time: 5638-7564 PT Time Calculation (min) (ACUTE ONLY): 21 min   Charges:   PT Evaluation $PT Eval Moderate Complexity: 1 Mod   PT General Charges $$ ACUTE PT VISIT: 1 Visit         Adison Jerger H. Manson Passey, PT, DPT, NCS 08/03/23, 12:12 PM [phone]

## 2023-08-03 NOTE — Evaluation (Signed)
Occupational Therapy Evaluation Patient Details Name: Nicole Bishop MRN: [id number] DOB: Oct 24, 1930 Today's Date: 08/03/2023   History of Present Illness Nicole Bishop is a 89 y.o. female with medical history significant of PAF and DVT on Xarelto, HTN, CAD, CKD stage IIIa, dementia, hypothyroidism, GERD, morbid obesity, brought in by family member for generalized weakness, cough, confusion.  Admitted for management of acute metabolic encephalopathy due to R LL PNA   Clinical Impression   Nicole Bishop was seen for OT/Nicole Bishop co-evaluation this date. Prior to hospital admission, Nicole Bishop was performing SPT t/fs to her [facility] with assist from family. Per son at bedside, Nicole Bishop was amb with a 4WW ~ 1 month Nicole Bishop, but has had a recent decline in her functional abilities. Nicole Bishop lives in a 1 level apartment home with her adult children rotating to provide 24/7 assistance. Nicole Bishop presents to acute OT demonstrating impaired ADL performance and functional mobility 2/2 decreased cognition, increased pain with movement, decreased activity tolerance, and generalized weakness (See OT problem list for additional functional deficits). Nicole Bishop currently requires MOD-MAX A for UB ADL management in a supported sitting position, Anticipate MAX-TOTAL A+2 for functional mobility attempts.  Nicole Bishop would benefit from skilled OT services to address noted impairments and functional limitations (see below for any additional details) in order to maximize safety and independence while minimizing falls risk and caregiver burden. Anticipate the need for follow up OT services upon acute hospital DC.        If plan is discharge home, recommend the following: Two people to help with bathing/dressing/bathroom;Two people to help with walking and/or transfers;Assistance with cooking/housework;Assist for transportation;Help with stairs or ramp for entrance;Direct supervision/assist for financial management;Supervision due to cognitive status;Direct supervision/assist for  medications management    Functional Status Assessment  Patient has had a recent decline in their functional status and demonstrates the ability to make significant improvements in function in a reasonable and predictable amount of time.  Equipment Recommendations   (defer to next venue of care)    Recommendations for Other Services       Precautions / Restrictions Precautions Precautions: Fall Restrictions Weight Bearing Restrictions Per Provider Order: No      Mobility Bed Mobility Overal bed mobility: Needs Assistance Bed Mobility: Rolling Rolling: Total assist         General bed mobility comments: TOTAL A to bring into mid-line position in bed this date. Nicole Bishop significantly limited by lethargy/pain with mobility attempts.    Transfers                   General transfer comment: NT for Nicole Bishop safety/comfort      Balance Overall balance assessment: Needs assistance                                         ADL either performed or assessed with clinical judgement   ADL Overall ADL's : Needs assistance/impaired                                       General ADL Comments: Significantly functionally limited by impaired cognition, generalized weakenss, and pain with movement. Requires MAX A to bring cup/food to mouth for self feeding while in chair position in bed. MAX-TOTAL A for any re-positioning attempts. Nicole Bishop noted with significant R lateral lean. requires  frequent re-positioning support. Anticipate MAX A +2 for functional transfer attempts.     Vision Patient Visual Report: No change from baseline       Perception         Praxis         Pertinent Vitals/Pain Pain Assessment Pain Assessment: Faces Faces Pain Scale: Hurts even more Pain Location: LUE with movement, generalized pain/soreness with movement/re-positioning. Pain Descriptors / Indicators: Guarding, Grimacing, Sore Pain Intervention(s): Limited activity within  patient's tolerance, Monitored during session, Repositioned     Extremity/Trunk Assessment Upper Extremity Assessment Upper Extremity Assessment: Generalized weakness (grossly at least 2-/5 throughout; difficulty to fully assess due to lethargy and generalized pain.  L thumb MCP red, edematous; RN informed/aware (gout?)) LUE Deficits / Details: LUE notably swollen/red at 1st MCP, very sensitive to touch. Nicole Bishop also states L elbow is painful. Adjusted BP cuff for improved comfort. LUE Coordination: decreased gross motor;decreased fine motor   Lower Extremity Assessment Lower Extremity Assessment: Generalized weakness (minimal active movement noted in bilat LEs, largely limited by pain.  Rests in windswept to R position; limited tolerance for ROM assessment this date due to pain)       Communication Communication Communication: Hearing impairment Cueing Techniques: Verbal cues;Gestural cues;Tactile cues   Cognition Arousal: Lethargic Behavior During Therapy: WFL for tasks assessed/performed, Flat affect Overall Cognitive Status: Impaired/Different from baseline Area of Impairment: Following commands, Awareness, Orientation, Safety/judgement                 Orientation Level: Disoriented to, Place, Time, Situation     Following Commands: Follows one step commands inconsistently Safety/Judgement: Decreased awareness of safety, Decreased awareness of deficits Awareness: Intellectual   General Comments: Per son, generally more alert/awake. She is usually oriented to place, can identify family members, etc.     General Comments       Exercises Other Exercises Other Exercises: Nicole Bishop/caregiver (son) educated on role of OT in acute setting, bed mobility techniques, and DC recs this date.   Shoulder Instructions      Home Living Family/patient expects to be discharged to:: Private residence Living Arrangements: Children Available Help at Discharge: Family;Available 24  hours/day Type of Home: Apartment Home Access: Elevator     Home Layout: One level     Bathroom Shower/Tub: Chief Strategy Officer: Handicapped height     Home Equipment: Rollator (4 wheels);Tub bench;Wheelchair - manual          Prior Functioning/Environment Prior Level of Function : Needs assist       Physical Assist : ADLs (physical);Mobility (physical) Mobility (physical): Transfers ADLs (physical): Bathing;Dressing;IADLs Mobility Comments: Per son Nicole Bishop was amb with a 4WW ~ 1 month PTA, has recently required assistance with family to SPT to a WC for mobility. Denies falls history. ADLs Comments: Son reports family rotates to assist with care. Nicole Bishop has required increased assistance for ADL management over the last month. Was initially able to use a tub bench to transfer to shower, however, has recently required sponge baths while seated in her WC 2/2 safety concerns. Requires assistance with LB dressing. Family completes IADL management.        OT Problem List: Decreased strength;Decreased coordination;Pain;Decreased range of motion;Decreased activity tolerance;Decreased safety awareness;Impaired balance (sitting and/or standing);Decreased knowledge of use of DME or AE;Impaired UE functional use;Decreased cognition      OT Treatment/Interventions: Self-care/ADL training;Therapeutic exercise;Therapeutic activities;Neuromuscular education;Cognitive remediation/compensation;Energy conservation;Patient/family education;DME and/or AE instruction;Balance training    OT Goals(Current goals can be found  in the care plan section) Acute Rehab OT Goals Patient Stated Goal: To feel better OT Goal Formulation: With patient/family Time For Goal Achievement: 08/17/23 Potential to Achieve Goals: Good ADL Goals Nicole Bishop Will Perform Eating: sitting;with min assist Nicole Bishop Will Perform Grooming: sitting;with min assist Nicole Bishop Will Transfer to Toilet: with mod assist;stand pivot transfer;bedside  commode Nicole Bishop Will Perform Toileting - Clothing Manipulation and hygiene: sitting/lateral leans;with caregiver independent in assisting;with mod assist  OT Frequency: Min 1X/week    Co-evaluation Nicole Bishop/OT/SLP Co-Evaluation/Treatment: Yes Reason for Co-Treatment: Complexity of the patient's impairments (multi-system involvement) Nicole Bishop goals addressed during session: Mobility/safety with mobility OT goals addressed during session: ADL's and self-care      AM-PAC OT "6 Clicks" Daily Activity     Outcome Measure Help from another person eating meals?: A Lot Help from another person taking care of personal grooming?: A Lot Help from another person toileting, which includes using toliet, bedpan, or urinal?: Total Help from another person bathing (including washing, rinsing, drying)?: Total Help from another person to put on and taking off regular upper body clothing?: A Lot Help from another person to put on and taking off regular lower body clothing?: Total 6 Click Score: 9   End of Session    Activity Tolerance: Patient tolerated treatment well Patient left: in bed;with call bell/phone within reach;with bed alarm set;with family/visitor present (Bed in chair position)  OT Visit Diagnosis: Other abnormalities of gait and mobility (R26.89);Muscle weakness (generalized) (M62.81);Pain Pain - Right/Left: Left Pain - part of body: Arm;Hand                Time: 7829-5621 OT Time Calculation (min): 21 min Charges:  OT General Charges $OT Visit: 1 Visit OT Evaluation $OT Eval Moderate Complexity: 1 Mod  Shelvy Heckert Smith Robert, M.S., OTR/L 08/03/23, 12:09 PM

## 2023-08-03 NOTE — ED Notes (Signed)
Pt rolled and incontinence brief checked. Pt is dry. No redness present to buttocks or perineal area. Pt repositioned. Pt calling out in pain during repositioning.

## 2023-08-03 NOTE — Hospital Course (Signed)
Nicole Bishop is a 89 y.o. female with medical history significant of PAF and DVT on Xarelto, HTN, CAD, CKD stage IIIa, dementia, hypothyroidism, GERD, morbid obesity, brought in by family member for generalized weakness, cough, confusion.  Patient does not have leukocytosis, chest x-ray showed right lower lobe pneumonia.  Patient has been evaluated by speech therapy, on dysphagia 3 diet.  Antibiotics was started for pneumonia with Rocephin and Zithromax. 12/17.  Patient appear could not clear upper airway, added DuoNeb, poor prognosis, palliative care consult, CODE STATUS changed to DNR.

## 2023-08-03 NOTE — ED Notes (Signed)
Advised nurse that patient has ready bed 

## 2023-08-03 NOTE — Progress Notes (Signed)
  Progress Note   Patient: Nicole Bishop WUJ:[id number] DOB: [birth year] DOA: 08/02/2023     1 DOS: the patient was seen and examined on 08/03/2023   Brief hospital course: QUADASIA DISBRO is a 89 y.o. female with medical history significant of PAF and DVT on Xarelto, HTN, CAD, CKD stage IIIa, dementia, hypothyroidism, GERD, morbid obesity, brought in by family member for generalized weakness, cough, confusion.  Patient does not have leukocytosis, chest x-ray showed right lower lobe pneumonia.  Patient has been evaluated by speech therapy, on dysphagia 3 diet.  Antibiotics was started for pneumonia with Rocephin and Zithromax.   Principal Problem:   Pneumonia Active Problems:   CAP (community acquired pneumonia)   AMS (altered mental status)   Assessment and Plan: Acute metabolic encephalopathy. Dementia with delirium. Right lower lobe pneumonia. Patient does not meet sepsis criteria, as a result sepsis has ruled out. Patient has baseline dementia with worsening altered mental status, this reflect acute metabolic encephalopathy from pneumonia. Right lower lobe pneumonia most likely secondary to aspiration, patient does has chronic dysphagia. Will continue antibiotics for now. Follow-up on blood culture results.  Chronic diastolic congestive heart failure. No exacerbation.  Chronic kidney disease stage IIIa. Hypokalemia. Replete potassium. Renal function still stable.  Paroxysmal atrial fibrillation. Continue Xarelto, heart rate under control.  Obesity with BMI 28.14.      Subjective:  Patient is very confused, does not have any complaint.  Physical Exam: Vitals:   08/03/23 1130 08/03/23 1141 08/03/23 1230 08/03/23 1330  BP: (!) 142/93  (!) 150/92 (!) 153/97  Pulse: (!) 58  76 71  Resp: 15  (!) 23 19  Temp:  98.7 F (37.1 C)    TempSrc:  Axillary    SpO2: 100%  97% 99%  Weight:       General exam: Appears calm and comfortable  Respiratory system: Clear to  auscultation. Respiratory effort normal. Cardiovascular system: Irregular. No JVD, murmurs, rubs, gallops or clicks. No pedal edema. Gastrointestinal system: Abdomen is nondistended, soft and nontender. No organomegaly or masses felt. Normal bowel sounds heard. Central nervous system: Alert and oriented x1. No focal neurological deficits. Extremities: Symmetric 5 x 5 power. Skin: No rashes, lesions or ulcers Psychiatry: Flat affect   Data Reviewed:  Chest x-ray and lab results reviewed.  Family Communication: Son updated at bedside.  Disposition: Status is: Inpatient Remains inpatient appropriate because: Severity of disease, IV treatment.     Time spent: 35 minutes  Author: Marrion Coy, MD 08/03/2023 2:29 PM  For on call review [email or web address].

## 2023-08-03 NOTE — ED Notes (Signed)
Family at bedside assisting with breakfast tray

## 2023-08-04 DIAGNOSIS — F0150 Vascular dementia without behavioral disturbance: Secondary | ICD-10-CM | POA: Diagnosis not present

## 2023-08-04 DIAGNOSIS — J189 Pneumonia, unspecified organism: Secondary | ICD-10-CM | POA: Diagnosis not present

## 2023-08-04 DIAGNOSIS — G9341 Metabolic encephalopathy: Secondary | ICD-10-CM | POA: Diagnosis not present

## 2023-08-04 DIAGNOSIS — F05 Delirium due to known physiological condition: Secondary | ICD-10-CM | POA: Diagnosis not present

## 2023-08-04 LAB — BASIC METABOLIC PANEL
Anion gap: 5 (ref 5–15)
BUN: 27 mg/dL — ABNORMAL HIGH (ref 8–23)
CO2: 27 mmol/L (ref 22–32)
Calcium: 8.7 mg/dL — ABNORMAL LOW (ref 8.9–10.3)
Chloride: 105 mmol/L (ref 98–111)
Creatinine, Ser: 1.3 mg/dL — ABNORMAL HIGH (ref 0.44–1.00)
GFR, Estimated: 39 mL/min — ABNORMAL LOW (ref >60)
Glucose, Bld: 88 mg/dL (ref 70–99)
Potassium: 4.2 mmol/L (ref 3.5–5.1)
Sodium: 137 mmol/L (ref 135–145)

## 2023-08-04 LAB — MAGNESIUM: Magnesium: 1.6 mg/dL — ABNORMAL LOW (ref 1.7–2.4)

## 2023-08-04 MED ORDER — MAGNESIUM SULFATE 2 GM/50ML IV SOLN
2.0000 g | Freq: Once | INTRAVENOUS | Status: AC
  Administered 2023-08-04: 2 g via INTRAVENOUS
  Filled 2023-08-04: qty 50

## 2023-08-04 NOTE — Progress Notes (Signed)
  Progress Note   Patient: Nicole Bishop ZYS:[id number] DOB: 1931/06/09 DOA: 08/02/2023     2 DOS: the patient was seen and examined on 08/04/2023   Brief hospital course: ANAGABRIELA YOU is a 89 y.o. female with medical history significant of PAF and DVT on Xarelto, HTN, CAD, CKD stage IIIa, dementia, hypothyroidism, GERD, morbid obesity, brought in by family member for generalized weakness, cough, confusion.  Patient does not have leukocytosis, chest x-ray showed right lower lobe pneumonia.  Patient has been evaluated by speech therapy, on dysphagia 3 diet.  Antibiotics was started for pneumonia with Rocephin and Zithromax.   Principal Problem:   Pneumonia Active Problems:   CKD stage 3a, GFR 45-59 ml/min (HCC)   Paroxysmal atrial fibrillation (HCC)   Chronic diastolic CHF (congestive heart failure), NYHA class 3 (HCC)   Dementia, vascular with delirium (HCC)   CAP (community acquired pneumonia)   AMS (altered mental status)   Acute metabolic encephalopathy   Assessment and Plan:  Acute metabolic encephalopathy. Dementia with delirium. Right lower lobe pneumonia. Patient does not meet sepsis criteria, as a result sepsis has ruled out. Patient has baseline dementia with worsening altered mental status, this reflect acute metabolic encephalopathy from pneumonia. Right lower lobe pneumonia most likely secondary to aspiration, patient does has chronic dysphagia. Blood culture negative. Will continue Rocephin, discontinue Zithromax as patient does not have any possibility atypical bacteria infection. Patient mental status has been better today, currently pending nursing placement.   Chronic diastolic congestive heart failure. No exacerbation.   Chronic kidney disease stage IIIa. Hypokalemia. Hypomagnesemia. Minimal metabolic acidosis. Renal function better, potassium normalized.  Will replete magnesium.   Paroxysmal atrial fibrillation. Continue Xarelto, heart rate under  control.   Overweight with BMI 28.14.  Not obesity.      Subjective:  Patient is confused as at baseline.  No agitation. No short of breath.  Physical Exam: Vitals:   08/03/23 1658 08/03/23 1937 08/04/23 0416 08/04/23 0757  BP: 129/86 103/69 (!) 140/92 (!) 140/104  Pulse: 70 63 75 84  Resp: 18 18 18 18   Temp: 98.3 F (36.8 C) 97.8 F (36.6 C) 97.8 F (36.6 C) 98.2 F (36.8 C)  TempSrc:      SpO2: 99% 96% 97% 95%  Weight:       General exam: Appears calm and comfortable  Respiratory system: Clear to auscultation. Respiratory effort normal. Cardiovascular system: S1 & S2 heard, RRR. No JVD, murmurs, rubs, gallops or clicks. No pedal edema. Gastrointestinal system: Abdomen is nondistended, soft and nontender. No organomegaly or masses felt. Normal bowel sounds heard. Central nervous system: Alert and oriented x1. No focal neurological deficits. Extremities: Symmetric 5 x 5 power. Skin: No rashes, lesions or ulcers Psychiatry: Flat affect.   Data Reviewed:  Severity of disease, IV treatment.  Family Communication: Son updated at bedside.  Disposition: Status is: Inpatient Remains inpatient appropriate because: Severity of disease, IV treatment.     Time spent: 35 minutes  Author: Marrion Coy, MD 08/04/2023 10:03 AM  For on call review [email or web address].

## 2023-08-04 NOTE — TOC Initial Note (Signed)
Transition of Care [facility]) - Initial/Assessment Note    Patient Details  Name: Nicole Bishop MRN: [id number] Date of Birth: 1931/04/14  Transition of Care [facility]) CM/SW Contact:    Allena Katz, LCSW Phone Number: 08/04/2023, 10:01 AM  Clinical Narrative: CSW spoke with son who states that pt lives at home with her daughter. Son reports that pt was able to walk with a walker prior to admission. Son states he would like an aide at home. CSW directed him to call her PCP as pt has medicaid and that would pay for this at home. Son going to contact PCP and would like a referral to be sent to [facility].                  Expected Discharge Plan: Skilled Nursing Facility Barriers to Discharge: Continued Medical Work up   Patient Goals and CMS Choice   CMS Medicare.gov Compare Post Acute Care list provided to:: Patient        Expected Discharge Plan and Services       Living arrangements for the past 2 months: Single Family Home                                      Prior Living Arrangements/Services Living arrangements for the past 2 months: Single Family Home Lives with:: Adult Children Patient language and need for interpreter reviewed:: Yes Do you feel safe going back to the place where you live?: Yes      Need for Family Participation in Patient Care: Yes (Comment) Care giver support system in place?: Yes (comment) Current home services: DME    Activities of Daily Living      Permission Sought/Granted                  Emotional Assessment       Orientation: : Fluctuating Orientation (Suspected and/or reported Sundowners) Alcohol / Substance Use: Not Applicable    Admission diagnosis:  Pneumonia [J18.9] Generalized weakness [R53.1] Community acquired pneumonia of right lower lobe of lung [J18.9] Patient Active Problem List   Diagnosis Date Noted   Acute metabolic encephalopathy 08/03/2023   CAP (community acquired pneumonia) 08/02/2023   AMS  (altered mental status) 08/02/2023   Pneumonia 08/02/2023   Right renal artery stenosis (HCC) 03/01/2020   Weight loss 09/27/2019   Dementia, vascular with delirium (HCC) 07/27/2019   Abdominal aortic aneurysm (AAA) without rupture (HCC) 05/24/2019   Anemia in chronic kidney disease 05/06/2019   Edema of lower extremity 05/06/2019   Hyperparathyroidism due to renal insufficiency (HCC) 05/05/2019   Paroxysmal atrial fibrillation (HCC) 11/02/2018   Chronic venous insufficiency 06/23/2018   Lymphedema 06/23/2018   DVT (deep venous thrombosis) (HCC) 06/09/2018   Major depressive disorder, recurrent, mild (HCC) 05/06/2018   Abnormal gait 12/03/2017   Chronic diastolic CHF (congestive heart failure), NYHA class 3 (HCC) 10/29/2017   Mild dementia (HCC) 09/29/2017   Sleep behavior disorder, REM 09/29/2017   Visual hallucinations 07/16/2017   SOB (shortness of breath) on exertion 10/30/2016   Depression, major, single episode, complete remission (HCC) 08/03/2016   Renal failure 03/17/2015   CKD stage 3a, GFR 45-59 ml/min (HCC) 12/22/2014   Anemia, iron deficiency 12/22/2014   IDA (iron deficiency anemia) 12/14/2014   Chronic constipation 06/03/2014   Essential (primary) hypertension 05/03/2014   Acid reflux 04/20/2014   H/O: obesity 04/20/2014   Airway hyperreactivity 04/20/2014  Arthritis of shoulder region, degenerative 01/27/2014   B12 deficiency 12/27/2013   Dizziness 03/13/2012   Back ache 11/04/2011   Avitaminosis D 11/04/2011   Mixed hyperlipidemia 10/31/2011   Symptomatic anemia 10/31/2011   Depression 05/21/2011   Hearing loss 05/21/2011   PCP:  Barbette Reichmann, MD Pharmacy:   [facility] [address] [place] - [address] [place] [address] Phone: [phone] Fax: [phone]     Social Drivers of Health (SDOH) Social History: SDOH Screenings   Food Insecurity: No Food Insecurity (04/11/2023)   Received from [facility]  Housing: Low Risk  (04/11/2023)   Received from [facility]  Transportation Needs: No Transportation Needs (04/11/2023)   Received from [facility]  Utilities: Not At Risk (04/11/2023)   Received from [facility]  Depression [phone]): Low Risk  (08/27/2018)  Financial Resource Strain: Low Risk  (04/11/2023)   Received from [facility]  Tobacco Use: Low Risk  (08/01/2023)   SDOH Interventions:     Readmission Risk Interventions     No data to display

## 2023-08-04 NOTE — NC FL2 (Signed)
[place] MEDICAID FL2 LEVEL OF CARE FORM     IDENTIFICATION  Patient Name: Nicole Bishop Birthdate: [birth year] Sex: female Admission Date (Current Location): 08/02/2023  [place] and [place] Number:  Chiropodist and Address:  [facility], [address] [place], [place] [address]      Provider Number: [id number]  Attending Physician Name and Address:  Marrion Coy, MD  Relative Name and Phone Number:  Daye,Terrie (Daughter)  [phone]    Current Level of Care: Hospital Recommended Level of Care: Skilled Nursing Facility Prior Approval Number:    Date Approved/Denied:   PASRR Number: [id number]  Discharge Plan: SNF    Current Diagnoses: Patient Active Problem List   Diagnosis Date Noted   Acute metabolic encephalopathy 08/03/2023   CAP (community acquired pneumonia) 08/02/2023   AMS (altered mental status) 08/02/2023   Pneumonia 08/02/2023   Right renal artery stenosis (HCC) 03/01/2020   Weight loss 09/27/2019   Dementia, vascular with delirium (HCC) 07/27/2019   Abdominal aortic aneurysm (AAA) without rupture (HCC) 05/24/2019   Anemia in chronic kidney disease 05/06/2019   Edema of lower extremity 05/06/2019   Hyperparathyroidism due to renal insufficiency (HCC) 05/05/2019   Paroxysmal atrial fibrillation (HCC) 11/02/2018   Chronic venous insufficiency 06/23/2018   Lymphedema 06/23/2018   DVT (deep venous thrombosis) (HCC) 06/09/2018   Major depressive disorder, recurrent, mild (HCC) 05/06/2018   Abnormal gait 12/03/2017   Chronic diastolic CHF (congestive heart failure), NYHA class 3 (HCC) 10/29/2017   Mild dementia (HCC) 09/29/2017   Sleep behavior disorder, REM 09/29/2017   Visual hallucinations 07/16/2017   SOB (shortness of breath) on exertion 10/30/2016   Depression, major, single episode, complete remission (HCC) 08/03/2016   Renal failure 03/17/2015   CKD stage 3a, GFR 45-59 ml/min (HCC) 12/22/2014   Anemia,  iron deficiency 12/22/2014   IDA (iron deficiency anemia) 12/14/2014   Chronic constipation 06/03/2014   Essential (primary) hypertension 05/03/2014   Acid reflux 04/20/2014   H/O: obesity 04/20/2014   Airway hyperreactivity 04/20/2014   Arthritis of shoulder region, degenerative 01/27/2014   B12 deficiency 12/27/2013   Dizziness 03/13/2012   Back ache 11/04/2011   Avitaminosis D 11/04/2011   Mixed hyperlipidemia 10/31/2011   Symptomatic anemia 10/31/2011   Depression 05/21/2011   Hearing loss 05/21/2011    Orientation RESPIRATION BLADDER Height & Weight     Self  Normal Incontinent Weight: 207 lb 7.3 oz (94.1 kg) Height:     BEHAVIORAL SYMPTOMS/MOOD NEUROLOGICAL BOWEL NUTRITION STATUS      Continent Diet  AMBULATORY STATUS COMMUNICATION OF NEEDS Skin   Extensive Assist Verbally Normal                       Personal Care Assistance Level of Assistance  Bathing, Feeding, Dressing Bathing Assistance: Maximum assistance Feeding assistance: Limited assistance Dressing Assistance: Maximum assistance     Functional Limitations Info             SPECIAL CARE FACTORS FREQUENCY  PT (By licensed PT), OT (By licensed OT)     PT Frequency: 5 times a week OT Frequency: 5 times a week            Contractures Contractures Info: Not present    Additional Factors Info  Code Status, Allergies Code Status Info: FULL Allergies Info: Penicillins  Nsaids  Rosuvastatin  Atorvastatin  Celebrex (Celecoxib)  Felodipine  Oxaprozin  Rofecoxib  Simvastatin  Current Medications (08/04/2023):  This is the current hospital active medication list Current Facility-Administered Medications  Medication Dose Route Frequency Provider Last Rate Last Admin   acetaminophen (TYLENOL) tablet 650 mg  650 mg Oral Q8H PRN Marrion Coy, MD   650 mg at 08/04/23 0909   cefTRIAXone (ROCEPHIN) 2 g in sodium chloride 0.9 % 100 mL IVPB  2 g Intravenous Q24H Mikey College T, MD 200 mL/hr at  08/04/23 0907 2 g at 08/04/23 0907   cromolyn (OPTICROM) 4 % ophthalmic solution 1 drop  1 drop Both Eyes QID Mikey College T, MD   1 drop at 08/04/23 1318   docusate sodium (COLACE) capsule 100 mg  100 mg Oral BID Mikey College T, MD   100 mg at 08/04/23 0907   fluticasone (FLONASE) 50 MCG/ACT nasal spray 1 spray  1 spray Each Nare Daily Mikey College T, MD   1 spray at 08/02/23 1206   loratadine (CLARITIN) tablet 10 mg  10 mg Oral Daily Mikey College T, MD   10 mg at 08/04/23 0907   LORazepam (ATIVAN) injection 0.5 mg  0.5 mg Intramuscular Q6H PRN Mikey College T, MD       metoprolol succinate (TOPROL-XL) 24 hr tablet 12.5 mg  12.5 mg Oral Daily Mikey College T, MD   12.5 mg at 08/04/23 0907   olopatadine (PATANOL) 0.1 % ophthalmic solution 1 drop  1 drop Both Eyes BID PRN Mikey College T, MD       ondansetron [facility]) tablet 4 mg  4 mg Oral Q6H PRN Mikey College T, MD       Or   ondansetron [facility]) injection 4 mg  4 mg Intravenous Q6H PRN Mikey College T, MD       polyethylene glycol (MIRALAX / GLYCOLAX) packet 17 g  17 g Oral Daily PRN Mikey College T, MD       Rivaroxaban Carlena Hurl) tablet 15 mg  15 mg Oral Q supper Marrion Coy, MD   15 mg at 08/03/23 1852   traZODone (DESYREL) tablet 50 mg  50 mg Oral QHS Mikey College T, MD   50 mg at 08/03/23 2153   Facility-Administered Medications Ordered in Other Encounters  Medication Dose Route Frequency Provider Last Rate Last Admin   0.9 %  sodium chloride infusion   Intravenous Continuous Rosey Bath, MD   Stopped at 02/06/18 1429     Discharge Medications: Please see discharge summary for a list of discharge medications.  Relevant Imaging Results:  Relevant Lab Results:   Additional Information SS: [id number]  Allena Katz, LCSW

## 2023-08-05 DIAGNOSIS — F0150 Vascular dementia without behavioral disturbance: Secondary | ICD-10-CM | POA: Diagnosis not present

## 2023-08-05 DIAGNOSIS — J690 Pneumonitis due to inhalation of food and vomit: Secondary | ICD-10-CM | POA: Diagnosis not present

## 2023-08-05 DIAGNOSIS — G9341 Metabolic encephalopathy: Secondary | ICD-10-CM | POA: Diagnosis not present

## 2023-08-05 DIAGNOSIS — F05 Delirium due to known physiological condition: Secondary | ICD-10-CM | POA: Diagnosis not present

## 2023-08-05 MED ORDER — IPRATROPIUM-ALBUTEROL 0.5-2.5 (3) MG/3ML IN SOLN
3.0000 mL | Freq: Three times a day (TID) | RESPIRATORY_TRACT | Status: DC
Start: 2023-08-05 — End: 2023-08-07
  Administered 2023-08-05 – 2023-08-06 (×4): 3 mL via RESPIRATORY_TRACT
  Filled 2023-08-05 (×6): qty 3

## 2023-08-05 MED ORDER — ALBUTEROL SULFATE (2.5 MG/3ML) 0.083% IN NEBU: 2.5000 mg | INHALATION_SOLUTION | RESPIRATORY_TRACT | Status: DC | PRN

## 2023-08-05 MED ORDER — IPRATROPIUM-ALBUTEROL 0.5-2.5 (3) MG/3ML IN SOLN
3.0000 mL | Freq: Four times a day (QID) | RESPIRATORY_TRACT | Status: DC
  Administered 2023-08-05: 3 mL via RESPIRATORY_TRACT
  Filled 2023-08-05: qty 3

## 2023-08-05 NOTE — Progress Notes (Signed)
Progress Note   Patient: Nicole Bishop YNW:[id number] DOB: March 21, 1931 DOA: 08/02/2023     3 DOS: the patient was seen and examined on 08/05/2023   Brief hospital course: Nicole Bishop is a 89 y.o. female with medical history significant of PAF and DVT on Xarelto, HTN, CAD, CKD stage IIIa, dementia, hypothyroidism, GERD, morbid obesity, brought in by family member for generalized weakness, cough, confusion.  Patient does not have leukocytosis, chest x-ray showed right lower lobe pneumonia.  Patient has been evaluated by speech therapy, on dysphagia 3 diet.  Antibiotics was started for pneumonia with Rocephin and Zithromax. 12/17.  Patient appear could not clear upper airway, added DuoNeb, poor prognosis, palliative care consult, CODE STATUS changed to DNR.   Principal Problem:   Aspiration pneumonia of right lower lobe (HCC) Active Problems:   CKD stage 3a, GFR 45-59 ml/min (HCC)   Paroxysmal atrial fibrillation (HCC)   Chronic diastolic CHF (congestive heart failure), NYHA class 3 (HCC)   Dementia, vascular with delirium (HCC)   CAP (community acquired pneumonia)   AMS (altered mental status)   Acute metabolic encephalopathy   Assessment and Plan: Acute metabolic encephalopathy. Dementia with delirium. Right lower lobe aspiration pneumonia. Patient does not meet sepsis criteria, as a result sepsis has ruled out. Patient has baseline dementia with worsening altered mental status, this reflect acute metabolic encephalopathy from pneumonia. Right lower lobe pneumonia most likely secondary to aspiration, patient does has chronic dysphagia. Blood culture negative. When I see the patient today, patient could not clear her upper airway.  I have added to DuoNeb.  However, patient condition appears to be terminal, she will have recurrent aspiration pneumonia in the future.  Patient also has advanced age, dementia, prognosis very poor.  Palliative care consult obtained. Also discussed with  patient's son, CODE STATUS changed to DO NOT RESUSCITATE. Patient currently waiting for nursing home placement, can discharge when bed available.   Chronic diastolic congestive heart failure. No exacerbation.   Chronic kidney disease stage IIIa. Hypokalemia. Hypomagnesemia. Minimal metabolic acidosis. Renal function better, potassium normalized.  Magnesium also repleted.   Paroxysmal atrial fibrillation. Continue Xarelto, heart rate under control.   Overweight with BMI 28.14.  Not obesity.         Subjective:  Patient is confused as baseline. She had difficult to clear upper her upper airway, she appeared to be aspirating.  Physical Exam: Vitals:   08/04/23 0757 08/04/23 1531 08/04/23 1945 08/05/23 0913  BP: (!) 140/104 110/78 (!) 94/59 90/65  Pulse: 84 70  73  Resp: 18 19 18 16   Temp: 98.2 F (36.8 C) 97.7 F (36.5 C) 98.4 F (36.9 C) (!) 97.2 F (36.2 C)  TempSrc:  Oral    SpO2: 95% 99% 98% 97%  Weight:       General exam: Appears calm and comfortable  Respiratory system: Respiration with gurgling sounds. Respiratory effort normal. Cardiovascular system: Appear regular. No JVD, murmurs, rubs, gallops or clicks. No pedal edema. Gastrointestinal system: Abdomen is nondistended, soft and nontender. No organomegaly or masses felt. Normal bowel sounds heard. Central nervous system: Alert and oriented x1. No focal neurological deficits. Extremities: Symmetric 5 x 5 power. Skin: No rashes, lesions or ulcers Psychiatry: Flat affect   Data Reviewed:  There are no new results to review at this time.  Family Communication: Son updated at bedside.  Disposition: Status is: Inpatient Remains inpatient appropriate because: Severity of disease, IV treatment,     Time spent: 55 minutes  Author: Marrion Coy, MD 08/05/2023 10:14 AM  For on call review [email or web address].

## 2023-08-05 NOTE — Plan of Care (Addendum)
 Patient is alert and oriented X 1. Denies any pain.  Problem: Education: Goal: Knowledge of General Education information will improve Description: Including pain rating scale, medication(s)/side effects and non-pharmacologic comfort measures Outcome: Progressing   Problem: Health Behavior/Discharge Planning: Goal: Ability to manage health-related needs will improve Outcome: Progressing   Problem: Clinical Measurements: Goal: Ability to maintain clinical measurements within normal limits will improve Outcome: Progressing Goal: Will remain free from infection Outcome: Progressing Goal: Diagnostic test results will improve Outcome: Progressing Goal: Respiratory complications will improve Outcome: Progressing Goal: Cardiovascular complication will be avoided Outcome: Progressing   Problem: Activity: Goal: Risk for activity intolerance will decrease Outcome: Progressing   Problem: Nutrition: Goal: Adequate nutrition will be maintained Outcome: Progressing   Problem: Coping: Goal: Level of anxiety will decrease Outcome: Progressing   Problem: Elimination: Goal: Will not experience complications related to bowel motility Outcome: Progressing Goal: Will not experience complications related to urinary retention Outcome: Progressing   Problem: Pain Management: Goal: General experience of comfort will improve Outcome: Progressing   Problem: Safety: Goal: Ability to remain free from injury will improve Outcome: Progressing   Problem: Skin Integrity: Goal: Risk for impaired skin integrity will decrease Outcome: Progressing

## 2023-08-05 NOTE — Progress Notes (Signed)
Physical Therapy Treatment Patient Details Name: Nicole Bishop MRN: [id number] DOB: 25-Feb-1931 Today's Date: 08/05/2023   History of Present Illness Nicole Bishop is a 89 y.o. female with medical history significant of PAF and DVT on Xarelto, HTN, CAD, CKD stage IIIa, dementia, hypothyroidism, GERD, morbid obesity, brought in by family member for generalized weakness, cough, confusion.  Admitted for management of acute metabolic encephalopathy due to R LL PNA    PT Comments  Patient was agreeable to PT session. Daughter at the bedside. Gentle AAROM/PROM provided to extremities with joint pain reported in upper body with movement. The patient declined attempting to sit up on edge of bed due to fatigue and generalized pain. Facilitation provided to increase independence with rolling for routine repositioning for skin and joint integrity. Patient required total assistance for rolling to the left. Recommend to continue PT to maximize independence with rehabilitation < 3 hours/day recommended pending patient/family goals.    If plan is discharge home, recommend the following: Two people to help with walking and/or transfers;Two people to help with bathing/dressing/bathroom   Can travel by private vehicle     No  Equipment Recommendations   (to be determined)    Recommendations for Other Services       Precautions / Restrictions Precautions Precautions: Fall Restrictions Weight Bearing Restrictions Per Provider Order: No     Mobility  Bed Mobility Overal bed mobility: Needs Assistance Bed Mobility: Rolling Rolling: Total assist         General bed mobility comments: faciliation for reaching across midline for rolling to the left for repositioning for skin integrity. patient declined sitting up on edge of bed due to fatigue and generalized pain. daughter educated on bed controls for her own body mechanics when assisting patient with eating, grooming, etc.    Transfers                         Ambulation/Gait                   Stairs             Wheelchair Mobility     Tilt Bed    Modified Rankin (Stroke Patients Only)       Balance                                            Cognition Arousal: Lethargic Behavior During Therapy: WFL for tasks assessed/performed, Flat affect Overall Cognitive Status: Impaired/Different from baseline Area of Impairment: Following commands, Problem solving                       Following Commands: Follows one step commands inconsistently     Problem Solving: Slow processing, Decreased initiation, Difficulty sequencing, Requires verbal cues, Requires tactile cues          Exercises General Exercises - Upper Extremity Elbow Flexion: PROM, AAROM, Strengthening, Both, 5 reps, Supine (occasional assistance provided from patient) Elbow Extension: PROM, AAROM, Both, 5 reps, Supine (occasional assistance provided from patient) Wrist Extension: PROM, Both, 5 reps, AAROM, Supine, Strengthening (occasional assistance provided from patient) General Exercises - Lower Extremity Ankle Circles/Pumps: PROM, AAROM, Strengthening, Both, 5 reps, Supine Heel Slides: PROM, AAROM, Strengthening, Both, 5 reps, Supine Hip ABduction/ADduction: PROM, AAROM, Strengthening, Both, 5 reps, Supine Other Exercises Other Exercises: cues provided.  joint pain reported in both arms with PROM of any UE joint    General Comments General comments (skin integrity, edema, etc.): Sp02 91-92% on room air and heart rate in the 80's whlie in bed      Pertinent Vitals/Pain Pain Assessment Pain Assessment: Faces Faces Pain Scale: Hurts little more Pain Location: generalized, upper body more than lower body Pain Descriptors / Indicators: Discomfort, Grimacing, Guarding Pain Intervention(s): Limited activity within patient's tolerance, Monitored during session, Repositioned    Home Living                           Prior Function            PT Goals (current goals can now be found in the care plan section) Acute Rehab PT Goals Patient Stated Goal: to feel better PT Goal Formulation: Patient unable to participate in goal setting Time For Goal Achievement: 08/17/23 Potential to Achieve Goals: Fair Progress towards PT goals: Progressing toward goals    Frequency    Min 1X/week      PT Plan      Co-evaluation              AM-PAC PT "6 Clicks" Mobility   Outcome Measure  Help needed turning from your back to your side while in a flat bed without using bedrails?: Total Help needed moving from lying on your back to sitting on the side of a flat bed without using bedrails?: Total Help needed moving to and from a bed to a chair (including a wheelchair)?: Total Help needed standing up from a chair using your arms (e.g., wheelchair or bedside chair)?: Total Help needed to walk in hospital room?: Total Help needed climbing 3-5 steps with a railing? : Total 6 Click Score: 6    End of Session   Activity Tolerance: Patient limited by fatigue;Patient limited by pain Patient left: in bed;with call bell/phone within reach;with bed alarm set;with family/visitor present   PT Visit Diagnosis: Muscle weakness (generalized) (M62.81);Difficulty in walking, not elsewhere classified (R26.2)     Time: 1027-2536 PT Time Calculation (min) (ACUTE ONLY): 16 min  Charges:    $Therapeutic Activity: 8-22 mins PT General Charges $$ ACUTE PT VISIT: 1 Visit                     Nicole Bishop, PT, MPT    Nicole Bishop 08/05/2023, 12:06 PM

## 2023-08-06 DIAGNOSIS — Z515 Encounter for palliative care: Secondary | ICD-10-CM | POA: Diagnosis not present

## 2023-08-06 DIAGNOSIS — R531 Weakness: Secondary | ICD-10-CM | POA: Diagnosis not present

## 2023-08-06 DIAGNOSIS — J690 Pneumonitis due to inhalation of food and vomit: Secondary | ICD-10-CM | POA: Diagnosis not present

## 2023-08-06 LAB — GLUCOSE, CAPILLARY: Glucose-Capillary: 81 mg/dL (ref 70–99)

## 2023-08-06 MED ORDER — DOCUSATE SODIUM 50 MG/5ML PO LIQD
100.0000 mg | Freq: Two times a day (BID) | ORAL | Status: DC
  Administered 2023-08-06 – 2023-08-07 (×3): 100 mg via ORAL
  Filled 2023-08-06 (×4): qty 10

## 2023-08-06 NOTE — Progress Notes (Signed)
  Progress Note   Patient: Nicole Bishop YQM:[id number] DOB: 1930/12/05 DOA: 08/02/2023     4 DOS: the patient was seen and examined on 08/06/2023   Brief hospital course: Nicole Bishop is a 89 y.o. female with medical history significant of PAF and DVT on Xarelto, HTN, CAD, CKD stage IIIa, dementia, hypothyroidism, GERD, morbid obesity, brought in by family member for generalized weakness, cough, confusion.  Patient does not have leukocytosis, chest x-ray showed right lower lobe pneumonia.  Patient has been evaluated by speech therapy, on dysphagia 3 diet.  Antibiotics was started for pneumonia with Rocephin and Zithromax. 12/17.  Patient appear could not clear upper airway, added DuoNeb, poor prognosis, palliative care consult, CODE STATUS changed to DNR.     Principal Problem:   Aspiration pneumonia of right lower lobe (HCC) Active Problems:   CKD stage 3a, GFR 45-59 ml/min (HCC)   Paroxysmal atrial fibrillation (HCC)   Chronic diastolic CHF (congestive heart failure), NYHA class 3 (HCC)   Dementia, vascular with delirium (HCC)   CAP (community acquired pneumonia)   AMS (altered mental status)   Acute metabolic encephalopathy     Assessment and Plan:  Acute metabolic encephalopathy. Dementia with delirium. Right lower lobe aspiration pneumonia. Sepsis has been ruled out as there was no evidence on admission Patient has baseline dementia admitted with acute metabolic encephalopathy from pneumonia. Right lower lobe pneumonia most likely secondary to aspiration, patient has chronic dysphagia. Blood culture negative. Continue strict aspiration precautions Continue dysphagia 3 diet Patient currently waiting for nursing home placement, can discharge when bed available.    Chronic diastolic congestive heart failure. Not acutely exacerbated.   Chronic kidney disease stage IIIb. Hypokalemia. Hypomagnesemia. Minimal metabolic acidosis. Renal function better, potassium  normalized.  Magnesium also repleted.   Paroxysmal atrial fibrillation. Continue Xarelto, heart rate under control.   Overweight with BMI 28.14.  Not obesity.          Subjective: Patient is seen and examined at the bedside.  Son is at the bedside.  Physical Exam: Vitals:   08/05/23 1955 08/06/23 0437 08/06/23 0730 08/06/23 0753  BP:  (!) 139/92 125/81   Pulse:  79 75   Resp:  18 14   Temp:  98.7 F (37.1 C) 98.4 F (36.9 C)   TempSrc:  Oral    SpO2: 94% 97% 100% 93%  Weight:       General exam: Appears calm and comfortable  Respiratory system: Respiration with gurgling sounds. Respiratory effort normal. Cardiovascular system: Appear regular. No JVD, murmurs, rubs, gallops or clicks. No pedal edema. Gastrointestinal system: Abdomen is nondistended, soft and nontender. No organomegaly or masses felt. Normal bowel sounds heard. Central nervous system: Alert and oriented x1. No focal neurological deficits. Extremities: Symmetric 5 x 5 power. Skin: No rashes, lesions or ulcers Psychiatry: Flat affect    Data Reviewed: Labs reviewed There are no new results to review at this time.  Family Communication: Plan of care discussed with patient and her son at the bedside.  He verbalizes understanding and agrees with the plan. Requests discharge to [facility] or [facility]  Disposition: Status is: Inpatient Remains inpatient appropriate because: Awaiting placement  Planned Discharge Destination: Skilled nursing facility    Time spent: 33 minutes  Author: Lucile Shutters, MD 08/06/2023 12:05 PM  For on call review [email or web address].

## 2023-08-06 NOTE — Care Management Important Message (Signed)
Important Message  Patient Details  Name: Nicole Bishop MRN: [id number] Date of Birth: June 04, 1931   Important Message Given:  Yes - Medicare IM     Claudie Brickhouse, Stephan Minister 08/06/2023, 2:24 PM

## 2023-08-06 NOTE — Consult Note (Signed)
Consultation Note Date: 08/06/2023 at 1130  Patient Name: Nicole Bishop  DOB: 05/18/1931  MRN: [id number]  Age / Sex: 89 y.o., female  PCP: Barbette Reichmann, MD Referring Physician: Lucile Shutters, MD  HPI/Patient Profile: 89 y.o. female  with past medical history of PAF and DVT on Xarelto, HTN, CAD, CKD stage IIIa, dementia, hypothyroidism, GERD, and morbid obesity admitted on 08/02/2023 with generalized weakness, cough, and confusion.  Chest x-ray revealed right lower lobe pneumonia.  Patient initiated on antibiotics.  SLP evaluated and placed on dysphagia 3 diet.  As per discussions with attending on 12/17, patient CODE STATUS was changed to DNR.  PMT was consulted to discuss goals of care.   Clinical Assessment and Goals of Care: Extensive chart review completed prior to meeting patient including labs, vital signs, imaging, progress notes, orders, and available advanced directive documents from current and previous encounters.   I then met with patient at bedside to discuss diagnosis prognosis, GOC, EOL wishes, disposition and options.  She was sleeping in no apparent distress.  She easily awakened, made eye contact with me, and quickly returned to sleep.  She made no vocalizations or effort to make conversation with me during our visit.  Respirations are even and unlabored.  No signs of pain or distress noted.  Patient unable to participate in goals of care or medical decision making independently at this time.  After visiting with the patient, I attempted to speak with her daughter Nena Jordan over the phone.  No answer.  HIPAA compliant voicemail with PMT contact info given.  No adjustment to [facility] or plan of care at this time.  PMT will continue to follow and support patient and family throughout her hospitalization.  I will attempt to follow-up with family at a later date/time to continue goals of  care discussions.  Primary Decision Maker NEXT OF KIN  Physical Exam Vitals reviewed.  Constitutional:      General: She is not in acute distress.    Appearance: She is normal weight. She is not ill-appearing.  HENT:     Head: Normocephalic.     Mouth/Throat:     Mouth: Mucous membranes are moist.  Eyes:     Pupils: Pupils are equal, round, and reactive to light.  Pulmonary:     Effort: Pulmonary effort is normal.  Abdominal:     Palpations: Abdomen is soft.  Musculoskeletal:     Comments: Generalized weakness  Skin:    General: Skin is warm and dry.  Psychiatric:        Behavior: Behavior normal.     Palliative Assessment/Data: 30     Thank you for this consult. Palliative medicine will continue to follow and assist holistically.   Time Total: 40 minutes  Time spent includes: Detailed review of medical records (labs, imaging, vital signs), medically appropriate exam (mental status, respiratory, cardiac, skin), discussed with treatment team, counseling and educating patient, family and staff, documenting clinical information, medication management and coordination of care.  Signed by: Georgiann Cocker, DNP,  FNP-BC Palliative Medicine   Please contact Palliative Medicine Team providers via [employer] for questions and concerns.

## 2023-08-06 NOTE — Progress Notes (Signed)
Occupational Therapy Treatment Patient Details Name: Nicole Bishop MRN: [id number] DOB: 1931/01/05 Today's Date: 08/06/2023   History of present illness Nicole Bishop is a 89 y.o. female with medical history significant of PAF and DVT on Xarelto, HTN, CAD, CKD stage IIIa, dementia, hypothyroidism, GERD, morbid obesity, brought in by family member for generalized weakness, cough, confusion.  Admitted for management of acute metabolic encephalopathy due to R LL PNA   OT comments  Chart reviewed to date, pt greeted in bed, oriented to self only. Tx session targeted improving functional activity tolerance for improved ADL performance. MAX-TOTAL A +2 required for supine<>sit with pt demonstrating fair sitting balance. Close supervision required for approx 1 minute, then CGA. Pt tolerated sitting on edge of bed for approx 3 minutes ,then requesting to return to supine. MAX A required fro LB dressing. Pt will continue to benefit from skilled OT to address functional deficits and to facilitate optimal ADL performance. OT will follow acutely.       If plan is discharge home, recommend the following:  Two people to help with bathing/dressing/bathroom;Two people to help with walking and/or transfers;Assistance with cooking/housework;Assist for transportation;Help with stairs or ramp for entrance;Direct supervision/assist for financial management;Supervision due to cognitive status;Direct supervision/assist for medications management   Equipment Recommendations  Other (comment) (defer)    Recommendations for Other Services      Precautions / Restrictions Precautions Precautions: Fall Restrictions Weight Bearing Restrictions Per Provider Order: No       Mobility Bed Mobility Overal bed mobility: Needs Assistance Bed Mobility: Rolling, Supine to Sit, Sit to Supine Rolling: Max assist, +2 for physical assistance, +2 for safety/equipment   Supine to sit: Max assist, Total assist, +2 for physical  assistance, +2 for safety/equipment Sit to supine: Max assist, Total assist, +2 for physical assistance, +2 for safety/equipment   General bed mobility comments: step by step multi modal cues, pt saying "it hurts" with mobility attempts    Transfers                   General transfer comment: deferred on this date     Balance Overall balance assessment: Needs assistance Sitting-balance support: Feet supported Sitting balance-Leahy Scale: Fair   Postural control: Posterior lean                                 ADL either performed or assessed with clinical judgement   ADL Overall ADL's : Needs assistance/impaired                     Lower Body Dressing: Maximal assistance Lower Body Dressing Details (indicate cue type and reason): donn/doff socks     Toileting- Clothing Manipulation and Hygiene: Total assistance;Bed level Toileting - Clothing Manipulation Details (indicate cue type and reason): anticipate            Extremity/Trunk Assessment              Vision       Perception     Praxis      Cognition Arousal: Lethargic Behavior During Therapy: WFL for tasks assessed/performed, Flat affect Overall Cognitive Status: No family/caregiver present to determine baseline cognitive functioning Area of Impairment: Following commands, Problem solving, Orientation, Attention, Memory, Safety/judgement, Awareness                 Orientation Level: Disoriented to, Place, Time, Situation Current Attention Level: Focused  Memory: Decreased short-term memory Following Commands: Follows one step commands inconsistently Safety/Judgement: Decreased awareness of safety, Decreased awareness of deficits Awareness: Intellectual Problem Solving: Slow processing, Decreased initiation, Difficulty sequencing, Requires verbal cues, Requires tactile cues          Exercises Other Exercises Other Exercises: edu re: importance of progressing  mobility    Shoulder Instructions       General Comments spo2 >95% on RA, HR in the 80s bpm pre/post mobility    Pertinent Vitals/ Pain       Pain Assessment Pain Assessment: PAINAD Breathing: occasional labored breathing, short period of hyperventilation Negative Vocalization: occasional moan/groan, low speech, negative/disapproving quality Facial Expression: sad, frightened, frown Body Language: tense, distressed pacing, fidgeting Consolability: distracted or reassured by voice/touch PAINAD Score: 5 Pain Location: generalized especially with mobility Pain Descriptors / Indicators: Discomfort, Grimacing, Guarding Pain Intervention(s): Monitored during session, Limited activity within patient's tolerance, Repositioned  Home Living                                          Prior Functioning/Environment              Frequency  Min 1X/week        Progress Toward Goals  OT Goals(current goals can now be found in the care plan section)  Progress towards OT goals: Progressing toward goals  Acute Rehab OT Goals Time For Goal Achievement: 08/17/23  Plan      Co-evaluation                 AM-PAC OT "6 Clicks" Daily Activity     Outcome Measure   Help from another person eating meals?: A Lot Help from another person taking care of personal grooming?: A Lot Help from another person toileting, which includes using toliet, bedpan, or urinal?: Total Help from another person bathing (including washing, rinsing, drying)?: Total Help from another person to put on and taking off regular upper body clothing?: A Lot Help from another person to put on and taking off regular lower body clothing?: Total 6 Click Score: 9    End of Session    OT Visit Diagnosis: Other abnormalities of gait and mobility (R26.89);Muscle weakness (generalized) (M62.81);Pain   Activity Tolerance Patient limited by pain   Patient Left in bed;with call bell/phone within  reach;with bed alarm set;with nursing/sitter in room   Nurse Communication          Time: 4098-1191 OT Time Calculation (min): 16 min  Charges: OT General Charges $OT Visit: 1 Visit OT Treatments $Therapeutic Activity: 8-22 mins  Oleta Mouse, OTD OTR/L  08/06/23, 3:45 PM

## 2023-08-06 NOTE — TOC Progression Note (Signed)
Transition of Care [facility]) - Progression Note    Patient Details  Name: Nicole Bishop MRN: [id number] Date of Birth: 11/01/1930  Transition [facility] Care [facility]) CM/SW Contact  Allena Katz, LCSW Phone Number: 08/06/2023, 9:04 AM  Clinical Narrative:   [facility] started today for [facility]    Expected Discharge Plan: Skilled Nursing Facility Barriers to Discharge: Continued Medical Work up  Expected Discharge Plan and Services       Living arrangements for the past 2 months: Single Family Home                                       Social Determinants of Health (SDOH) Interventions SDOH Screenings   Food Insecurity: Patient Unable To Answer (08/05/2023)  Housing: Unknown (08/05/2023)  Utilities: Not At Risk (04/11/2023)   Received from [facility]  Depression [id number]): Low Risk  (08/27/2018)  Financial Resource Strain: Low Risk  (04/11/2023)   Received from [facility]  Tobacco Use: Low Risk  (08/01/2023)    Readmission Risk Interventions     No data to display

## 2023-08-06 NOTE — Plan of Care (Signed)

## 2023-08-07 DIAGNOSIS — D509 Iron deficiency anemia, unspecified: Secondary | ICD-10-CM | POA: Diagnosis not present

## 2023-08-07 DIAGNOSIS — K219 Gastro-esophageal reflux disease without esophagitis: Secondary | ICD-10-CM | POA: Diagnosis present

## 2023-08-07 DIAGNOSIS — J690 Pneumonitis due to inhalation of food and vomit: Secondary | ICD-10-CM | POA: Diagnosis not present

## 2023-08-07 DIAGNOSIS — E785 Hyperlipidemia, unspecified: Secondary | ICD-10-CM | POA: Diagnosis present

## 2023-08-07 DIAGNOSIS — J189 Pneumonia, unspecified organism: Secondary | ICD-10-CM | POA: Diagnosis not present

## 2023-08-07 DIAGNOSIS — G9341 Metabolic encephalopathy: Secondary | ICD-10-CM | POA: Diagnosis not present

## 2023-08-07 DIAGNOSIS — E039 Hypothyroidism, unspecified: Secondary | ICD-10-CM | POA: Diagnosis present

## 2023-08-07 DIAGNOSIS — K5909 Other constipation: Secondary | ICD-10-CM | POA: Diagnosis not present

## 2023-08-07 DIAGNOSIS — I252 Old myocardial infarction: Secondary | ICD-10-CM | POA: Diagnosis not present

## 2023-08-07 DIAGNOSIS — M6259 Muscle wasting and atrophy, not elsewhere classified, multiple sites: Secondary | ICD-10-CM | POA: Diagnosis not present

## 2023-08-07 DIAGNOSIS — G4733 Obstructive sleep apnea (adult) (pediatric): Secondary | ICD-10-CM | POA: Diagnosis present

## 2023-08-07 DIAGNOSIS — F419 Anxiety disorder, unspecified: Secondary | ICD-10-CM | POA: Diagnosis present

## 2023-08-07 DIAGNOSIS — R531 Weakness: Secondary | ICD-10-CM | POA: Diagnosis not present

## 2023-08-07 DIAGNOSIS — F32A Depression, unspecified: Secondary | ICD-10-CM | POA: Diagnosis not present

## 2023-08-07 DIAGNOSIS — D61818 Other pancytopenia: Secondary | ICD-10-CM | POA: Diagnosis not present

## 2023-08-07 DIAGNOSIS — I5032 Chronic diastolic (congestive) heart failure: Secondary | ICD-10-CM | POA: Diagnosis not present

## 2023-08-07 DIAGNOSIS — M25571 Pain in right ankle and joints of right foot: Secondary | ICD-10-CM | POA: Diagnosis not present

## 2023-08-07 DIAGNOSIS — R1312 Dysphagia, oropharyngeal phase: Secondary | ICD-10-CM | POA: Diagnosis present

## 2023-08-07 DIAGNOSIS — D72819 Decreased white blood cell count, unspecified: Secondary | ICD-10-CM | POA: Diagnosis not present

## 2023-08-07 DIAGNOSIS — N390 Urinary tract infection, site not specified: Secondary | ICD-10-CM | POA: Diagnosis not present

## 2023-08-07 DIAGNOSIS — I160 Hypertensive urgency: Secondary | ICD-10-CM | POA: Diagnosis not present

## 2023-08-07 DIAGNOSIS — I130 Hypertensive heart and chronic kidney disease with heart failure and stage 1 through stage 4 chronic kidney disease, or unspecified chronic kidney disease: Secondary | ICD-10-CM | POA: Diagnosis not present

## 2023-08-07 DIAGNOSIS — D649 Anemia, unspecified: Secondary | ICD-10-CM | POA: Diagnosis not present

## 2023-08-07 DIAGNOSIS — Z66 Do not resuscitate: Secondary | ICD-10-CM | POA: Diagnosis not present

## 2023-08-07 DIAGNOSIS — I10 Essential (primary) hypertension: Secondary | ICD-10-CM | POA: Diagnosis not present

## 2023-08-07 DIAGNOSIS — F0153 Vascular dementia, unspecified severity, with mood disturbance: Secondary | ICD-10-CM | POA: Diagnosis not present

## 2023-08-07 DIAGNOSIS — I771 Stricture of artery: Secondary | ICD-10-CM | POA: Diagnosis not present

## 2023-08-07 DIAGNOSIS — N184 Chronic kidney disease, stage 4 (severe): Secondary | ICD-10-CM | POA: Diagnosis not present

## 2023-08-07 DIAGNOSIS — D508 Other iron deficiency anemias: Secondary | ICD-10-CM | POA: Diagnosis not present

## 2023-08-07 DIAGNOSIS — Z96653 Presence of artificial knee joint, bilateral: Secondary | ICD-10-CM | POA: Diagnosis present

## 2023-08-07 DIAGNOSIS — R5383 Other fatigue: Secondary | ICD-10-CM | POA: Diagnosis not present

## 2023-08-07 DIAGNOSIS — I480 Paroxysmal atrial fibrillation: Secondary | ICD-10-CM | POA: Diagnosis not present

## 2023-08-07 DIAGNOSIS — N1831 Chronic kidney disease, stage 3a: Secondary | ICD-10-CM | POA: Diagnosis not present

## 2023-08-07 DIAGNOSIS — R001 Bradycardia, unspecified: Secondary | ICD-10-CM | POA: Diagnosis not present

## 2023-08-07 DIAGNOSIS — F05 Delirium due to known physiological condition: Secondary | ICD-10-CM | POA: Diagnosis not present

## 2023-08-07 DIAGNOSIS — R4182 Altered mental status, unspecified: Secondary | ICD-10-CM | POA: Diagnosis not present

## 2023-08-07 DIAGNOSIS — R000 Tachycardia, unspecified: Secondary | ICD-10-CM | POA: Diagnosis not present

## 2023-08-07 DIAGNOSIS — J9811 Atelectasis: Secondary | ICD-10-CM | POA: Diagnosis not present

## 2023-08-07 DIAGNOSIS — R279 Unspecified lack of coordination: Secondary | ICD-10-CM | POA: Diagnosis not present

## 2023-08-07 DIAGNOSIS — G9340 Encephalopathy, unspecified: Secondary | ICD-10-CM | POA: Diagnosis not present

## 2023-08-07 DIAGNOSIS — I2510 Atherosclerotic heart disease of native coronary artery without angina pectoris: Secondary | ICD-10-CM | POA: Diagnosis present

## 2023-08-07 DIAGNOSIS — R404 Transient alteration of awareness: Secondary | ICD-10-CM | POA: Diagnosis not present

## 2023-08-07 DIAGNOSIS — R4189 Other symptoms and signs involving cognitive functions and awareness: Secondary | ICD-10-CM | POA: Diagnosis present

## 2023-08-07 DIAGNOSIS — I4891 Unspecified atrial fibrillation: Secondary | ICD-10-CM | POA: Diagnosis not present

## 2023-08-07 DIAGNOSIS — M6281 Muscle weakness (generalized): Secondary | ICD-10-CM | POA: Diagnosis not present

## 2023-08-07 DIAGNOSIS — F0150 Vascular dementia without behavioral disturbance: Secondary | ICD-10-CM | POA: Diagnosis not present

## 2023-08-07 DIAGNOSIS — Z515 Encounter for palliative care: Secondary | ICD-10-CM | POA: Diagnosis not present

## 2023-08-07 DIAGNOSIS — Z743 Need for continuous supervision: Secondary | ICD-10-CM | POA: Diagnosis not present

## 2023-08-07 DIAGNOSIS — R2681 Unsteadiness on feet: Secondary | ICD-10-CM | POA: Diagnosis not present

## 2023-08-07 LAB — BASIC METABOLIC PANEL
Anion gap: 7 (ref 5–15)
BUN: 30 mg/dL — ABNORMAL HIGH (ref 8–23)
CO2: 23 mmol/L (ref 22–32)
Calcium: 8.8 mg/dL — ABNORMAL LOW (ref 8.9–10.3)
Chloride: 106 mmol/L (ref 98–111)
Creatinine, Ser: 1.38 mg/dL — ABNORMAL HIGH (ref 0.44–1.00)
GFR, Estimated: 36 mL/min — ABNORMAL LOW (ref >60)
Glucose, Bld: 92 mg/dL (ref 70–99)
Potassium: 4.3 mmol/L (ref 3.5–5.1)
Sodium: 136 mmol/L (ref 135–145)

## 2023-08-07 LAB — CULTURE, BLOOD (SINGLE)
Culture: NO GROWTH
Special Requests: ADEQUATE

## 2023-08-07 MED ORDER — ALBUTEROL SULFATE (2.5 MG/3ML) 0.083% IN NEBU: 2.5000 mg | INHALATION_SOLUTION | RESPIRATORY_TRACT | 12 refills | Status: DC | PRN

## 2023-08-07 NOTE — Progress Notes (Signed)
  Progress Note   Patient: Nicole Bishop QMV:[id number] DOB: 31-Jan-1931 DOA: 08/02/2023     5 DOS: the patient was seen and examined on 08/07/2023   Brief hospital course: Nicole Bishop is a 89 y.o. female with medical history significant of PAF and DVT on Xarelto, HTN, CAD, CKD stage IIIa, dementia, hypothyroidism, GERD, morbid obesity, brought in by family member for generalized weakness, cough, confusion.  Patient does not have leukocytosis, chest x-ray showed right lower lobe pneumonia.  Patient has been evaluated by speech therapy, placed on dysphagia 3 diet.  Antibiotics was started for pneumonia with Rocephin and Zithromax. 12/17.  Patient appear could not clear upper airway, added DuoNeb, poor prognosis, palliative care consult, CODE STATUS changed to DNR.     Principal Problem:   Aspiration pneumonia of right lower lobe (HCC) Active Problems:   CKD stage 3a, GFR 45-59 ml/min (HCC)   Paroxysmal atrial fibrillation (HCC)   Chronic diastolic CHF (congestive heart failure), NYHA class 3 (HCC)   Dementia, vascular with delirium (HCC)   CAP (community acquired pneumonia)   AMS (altered mental status)   Acute metabolic encephalopathy       Assessment and Plan:  Acute metabolic encephalopathy. Dementia with delirium. Right lower lobe aspiration pneumonia. Sepsis has been ruled out as there was no evidence on admission Patient has baseline dementia and was admitted with acute metabolic encephalopathy from pneumonia. Right lower lobe pneumonia most likely secondary to aspiration, patient has chronic dysphagia. Blood culture negative. Continue strict aspiration precautions Continue dysphagia 3 diet Patient currently waiting for nursing home placement, can discharge when bed available.      Chronic diastolic congestive heart failure. Not acutely exacerbated.    Chronic kidney disease stage IIIb. Hypokalemia. Hypomagnesemia. Minimal metabolic acidosis. Renal function  better, potassium normalized.  Magnesium also repleted.   Paroxysmal atrial fibrillation. Continue Xarelto, heart rate under control.   Overweight with BMI 28.14.  Not obesity.              Subjective: Patient is seen and examined at the bedside.  No new complaints  Physical Exam: Vitals:   08/06/23 0753 08/06/23 1515 08/06/23 1956 08/07/23 0405  BP:  127/79 115/73 (!) 126/91  Pulse:  75 67 74  Resp:  16 20 18   Temp:  97.9 F (36.6 C) 98.6 F (37 C) (!) 97.4 F (36.3 C)  TempSrc:  Oral Oral Oral  SpO2: 93% 96% 93% 99%  Weight:        General exam: Appears calm and comfortable  Respiratory system: Respiration with gurgling sounds. Respiratory effort normal. Cardiovascular system: Appear regular. No JVD, murmurs, rubs, gallops or clicks. No pedal edema. Gastrointestinal system: Abdomen is nondistended, soft and nontender. No organomegaly or masses felt. Normal bowel sounds heard. Central nervous system: Alert and oriented x1. No focal neurological deficits. Extremities: Symmetric 5 x 5 power. Skin: No rashes, lesions or ulcers Psychiatry: Flat affect    Data Reviewed: Labs reviewed.  Creatinine 1.38 (patient's baseline) There are no new results to review at this time.  Family Communication: None  Disposition: Status is: Inpatient Remains inpatient appropriate because: Awaiting placement  Planned Discharge Destination: Skilled nursing facility    Time spent: 32 minutes  Author: Lucile Shutters, MD 08/07/2023 11:47 AM  For on call review [email or web address].

## 2023-08-07 NOTE — Progress Notes (Addendum)
Palliative Care Progress Note, Assessment & Plan   Patient Name: Nicole Bishop       Date: 08/07/2023 DOB: 14-Jun-1931  Age: 89 y.o. MRN#: [id number] Attending Physician: Lucile Shutters, MD Primary Care Physician: Barbette Reichmann, MD Admit Date: 08/02/2023  Subjective: Patient is sitting up in bed in no apparent distress.  She is asleep but easily awakens to my presence.  She is able to make her wishes known.  She is asking when lunch will be here.  No family or friends present during my visit.  HPI: 89 y.o. female  with past medical history of PAF and DVT on Xarelto, HTN, CAD, CKD stage IIIa, dementia, hypothyroidism, GERD, and morbid obesity admitted on 08/02/2023 with generalized weakness, cough, and confusion.   Chest x-ray revealed right lower lobe pneumonia.  Patient initiated on antibiotics.  SLP evaluated and placed on dysphagia 3 diet.   As per discussions with attending on 12/17, patient CODE STATUS was changed to DNR.   PMT was consulted to discuss goals of care.  Summary of counseling/coordination of care: Extensive chart review completed prior to meeting patient including labs, vital signs, imaging, progress notes, orders, and available advanced directive documents from current and previous encounters.   After reviewing the patient's chart and assessing the patient at bedside, spoke with patient in regards to symptom management and goals of care.  Symptoms assessed.  Patient shares she is not in any pain or discomfort at this time.  Her abdomen is soft and she denies obstipation.  She moves all extremities to command but has generalized weakness.  No adjustment to [facility] needed at this time.  I attempted to discuss plan and goals of care with patient.  She shares she is not sure what is  going on but that she feels okay.  She is unable to participate in goals of care discussions independently at this time.  After meeting with the patient, attempted to speak with her daughter over the phone. No answer.  HIPAA compliant voicemail with PMT contact info left.  PMT remains available to patient and family throughout her hospitalization.  Awaiting return phone call from family to continue goals of care discussions.  No adjustment to plan of care at this time.  Addendum: Received message that patient's daughter returned my phone call.  I attempted to call her back x 2.  No answer.  Call went straight to voicemail.  Physical Exam Vitals reviewed.  Constitutional:      General: She is not in acute distress.    Appearance: She is obese.  HENT:     Head: Normocephalic.     Mouth/Throat:     Mouth: Mucous membranes are moist.  Eyes:     Pupils: Pupils are equal, round, and reactive to light.  Pulmonary:     Effort: Pulmonary effort is normal.  Abdominal:     Palpations: Abdomen is soft.  Musculoskeletal:     Comments: Generalized weakness  Neurological:     Mental Status: She is alert.     Comments: Oriented to self  Psychiatric:        Mood and Affect: Mood normal.        Behavior: Behavior  normal.        Thought Content: Thought content normal.        Judgment: Judgment normal.             Total Time 25 minutes   Time spent includes: Detailed review of medical records (labs, imaging, vital signs), medically appropriate exam (mental status, respiratory, cardiac, skin), discussed with treatment team, counseling and educating patient, family and staff, documenting clinical information, medication management and coordination of care.  Samara Deist L. Bonita Quin, DNP, FNP-BC Palliative Medicine Team

## 2023-08-07 NOTE — TOC Transition Note (Addendum)
Transition of Care [facility]) - Discharge Note   Patient Details  Name: Nicole Bishop MRN: [id number] Date of Birth: Mar 26, 1931  Transition of Care [facility]) CM/SW Contact:  Allena Katz, LCSW Phone Number: 08/07/2023, 12:30 PM   Clinical Narrative:   Pt has authorization to go to [facility]. DC summary to be sent once in. RN given number for report.  Son notified.   Final next level of care: Skilled Nursing Facility Barriers to Discharge: Barriers Resolved   Patient Goals and CMS Choice   CMS Medicare.gov Compare Post Acute Care list provided to:: Patient        Discharge Placement              Patient chooses bed at: [name] Patient to be transferred to facility by: [facility] Name of family member notified: terrie Patient and family notified of of transfer: 08/07/23  Discharge Plan and Services Additional resources added to the After Visit Summary for                                       Social Drivers of Health (SDOH) Interventions SDOH Screenings   Food Insecurity: Patient Unable To Answer (08/05/2023)  Housing: Unknown (08/05/2023)  Utilities: Not At Risk (04/11/2023)   Received from [facility]  Depression [id number]): Low Risk  (08/27/2018)  Financial Resource Strain: Low Risk  (04/11/2023)   Received from [facility]  Tobacco Use: Low Risk  (08/01/2023)     Readmission Risk Interventions     No data to display

## 2023-08-07 NOTE — Progress Notes (Signed)
Physical Therapy Treatment Patient Details Name: Nicole Bishop MRN: [id number] DOB: Jan 28, 1931 Today's Date: 08/07/2023   History of Present Illness Nicole Bishop is a 89 y.o. female with medical history significant of PAF and DVT on Xarelto, HTN, CAD, CKD stage IIIa, dementia, hypothyroidism, GERD, morbid obesity, brought in by family member for generalized weakness, cough, confusion.  Admitted for management of acute metabolic encephalopathy due to R LL PNA    PT Comments  Pt resting in bed upon PT arrival; pt appearing agreeable to therapy during session (pt participating in sessions activities with extra time and cueing).  During session pt max assist x2 with bed mobility; mod assist x2 to stand from significantly elevated bed height (x2 trials); and min to mod assist x2 to side step to L along bed a few feet with RW use.  Vc's required for upright posture during standing activities.  Pt requiring pacing d/t fatigue with sessions activities.  Will continue to focus on strengthening, balance, and progressive functional mobility during hospitalization.    If plan is discharge home, recommend the following: Two people to help with walking and/or transfers;Two people to help with bathing/dressing/bathroom;Assistance with cooking/housework;Direct supervision/assist for medications management;Direct supervision/assist for financial management;Assist for transportation;Help with stairs or ramp for entrance   Can travel by private vehicle     No  Equipment Recommendations  Other (comment) (TBD at next facility)    Recommendations for Other Services       Precautions / Restrictions Precautions Precautions: Fall Restrictions Weight Bearing Restrictions Per Provider Order: No     Mobility  Bed Mobility Overal bed mobility: Needs Assistance Bed Mobility: Rolling, Supine to Sit, Sit to Supine Rolling: Max assist   Supine to sit: Max assist, +2 for physical assistance, HOB elevated Sit  to supine: Max assist, +2 for physical assistance, HOB elevated   General bed mobility comments: vc's for technique; assist for trunk and B LE's    Transfers Overall transfer level: Needs assistance Equipment used: Rolling walker (2 wheels) Transfers: Sit to/from Stand Sit to Stand: Mod assist, +2 physical assistance, From elevated surface           General transfer comment: bed height significantly elevated; vc's and assist for B UE/LE placement; assist to initiate stand and control descent sitting; x2 trials standing    Ambulation/Gait Ambulation/Gait assistance: Min assist, Mod assist, +2 physical assistance Gait Distance (Feet):  (side step to L x3 feet (along bed towards [facility]))     Gait velocity: decreased     General Gait Details: vc's for upright posture; increased effort to take steps   Stairs             Wheelchair Mobility     Tilt Bed    Modified Rankin (Stroke Patients Only)       Balance Overall balance assessment: Needs assistance Sitting-balance support: Bilateral upper extremity supported, Feet supported Sitting balance-Leahy Scale: Fair Sitting balance - Comments: Pt initially with posterior lean (mod assist for upright sitting balance) but with repositioning pt improved to CGA Postural control: Posterior lean Standing balance support: Bilateral upper extremity supported, Reliant on assistive device for balance Standing balance-Leahy Scale: Poor Standing balance comment: 2 assist and RW use                            Cognition Arousal: Alert Behavior During Therapy: Flat affect Overall Cognitive Status: No family/caregiver present to determine baseline cognitive functioning Area  of Impairment: Following commands, Problem solving, Orientation, Attention, Memory, Safety/judgement, Awareness                 Orientation Level: Disoriented to, Place, Time, Situation Current Attention Level: Focused Memory: Decreased  short-term memory Following Commands: Follows one step commands inconsistently Safety/Judgement: Decreased awareness of safety, Decreased awareness of deficits Awareness: Intellectual Problem Solving: Slow processing, Decreased initiation, Difficulty sequencing, Requires verbal cues, Requires tactile cues          Exercises      General Comments        Pertinent Vitals/Pain Pain Assessment Pain Assessment: Faces Faces Pain Scale: No hurt Pain Intervention(s): Limited activity within patient's tolerance, Monitored during session HR 76-93 bpm and SpO2 sats 92% or greater on room air during sessions activities.    Home Living                          Prior Function            PT Goals (current goals can now be found in the care plan section) Acute Rehab PT Goals PT Goal Formulation: Patient unable to participate in goal setting Time For Goal Achievement: 08/17/23 Potential to Achieve Goals: Fair Progress towards PT goals: Progressing toward goals    Frequency    Min 1X/week      PT Plan      Co-evaluation              AM-PAC PT "6 Clicks" Mobility   Outcome Measure  Help needed turning from your back to your side while in a flat bed without using bedrails?: A Lot Help needed moving from lying on your back to sitting on the side of a flat bed without using bedrails?: Total Help needed moving to and from a bed to a chair (including a wheelchair)?: Total Help needed standing up from a chair using your arms (e.g., wheelchair or bedside chair)?: Total Help needed to walk in hospital room?: Total Help needed climbing 3-5 steps with a railing? : Total 6 Click Score: 7    End of Session Equipment Utilized During Treatment: Gait belt Activity Tolerance: Patient limited by fatigue Patient left: in bed;with call bell/phone within reach;with bed alarm set;Other (comment) (B heels floating via pillow support) Nurse Communication: Mobility  status;Precautions PT Visit Diagnosis: Muscle weakness (generalized) (M62.81);Difficulty in walking, not elsewhere classified (R26.2)     Time: 6578-4696 PT Time Calculation (min) (ACUTE ONLY): 26 min  Charges:    $Therapeutic Activity: 23-37 mins PT General Charges $$ ACUTE PT VISIT: 1 Visit                     Hendricks Limes, PT 08/07/23, 11:14 AM

## 2023-08-07 NOTE — Discharge Summary (Signed)
Physician Discharge Summary   Patient: Nicole Bishop MRN: [id number] DOB: 1931/01/29  Admit date:     08/02/2023  Discharge date: 08/07/23  Discharge Physician: Mirielle Byrum   PCP: Barbette Reichmann, MD   Recommendations at discharge:   Aspiration precautions  Discharge Diagnoses: Principal Problem:   Aspiration pneumonia of right lower lobe (HCC) Active Problems:   CKD stage 3a, GFR 45-59 ml/min (HCC)   Paroxysmal atrial fibrillation (HCC)   Chronic diastolic CHF (congestive heart failure), NYHA class 3 (HCC)   Dementia, vascular with delirium (HCC)   CAP (community acquired pneumonia)   AMS (altered mental status)   Acute metabolic encephalopathy  Resolved Problems:   * No resolved hospital problems. *  Hospital Course:   Nicole Bishop is a 89 y.o. female with medical history significant of PAF and DVT on Xarelto, HTN, CAD, CKD stage IIIa, dementia, hypothyroidism, GERD, morbid obesity, brought in by family member for generalized weakness, cough, confusion.   Daughter at bedside gave all history.  Patient started to have productive cough with occasional whitish phlegm production at least 1 week ago, and lasted 3 days patient became very weak and stayed in bed all the time and now eating and drinking much.  But denied any nauseous vomiting or diarrhea and no fever.  Yesterday, family also noticed the patient is confused and mumbling " hard to understand what she is talking about".   ED Course: Afebrile, nontachycardic nonhypotensive nonhypoxic.  Blood work showed WBC 4.5, hemoglobin 11.1, creatinine 1.6 compared to baseline 1.4-1.7, K3.4, bicarb 21.  CT head showed no acute findings, chest x-ray concerned about right lower lobe consolidation.   Patient was started on IV fluid and ceftriaxone and azithromycin     Assessment and Plan:  Acute metabolic encephalopathy. Dementia with delirium. Right lower lobe aspiration pneumonia. Sepsis has been ruled out as there  was no evidence on admission Patient has baseline dementia and was admitted with acute metabolic encephalopathy from pneumonia. She is currently back to her baseline mental status Right lower lobe pneumonia most likely secondary to aspiration, patient has chronic dysphagia in the setting of underlying dementia. Blood culture negative. Continue strict aspiration precautions Continue dysphagia 3 diet        Chronic diastolic congestive heart failure. Not acutely exacerbated.     Chronic kidney disease stage IIIb. Hypokalemia. Hypomagnesemia. Minimal metabolic acidosis. Renal function better, potassium normalized.  Magnesium also repleted.   Paroxysmal atrial fibrillation. Continue Xarelto, heart rate under control.   Overweight with BMI 28.14.  Not obesity.         Consultants:  Procedures performed: Swallow function evaluation Disposition: Skilled nursing facility Diet recommendation:  Discharge Diet Orders (From admission, onward)     Start     Ordered   08/07/23 0000  Diet - low sodium heart healthy        08/07/23 1230           Dysphagia type 3 diet with thin Liquid DISCHARGE MEDICATION: Allergies as of 08/07/2023       Reactions   Penicillins Rash, Other (See Comments)   Has patient had a PCN reaction causing immediate rash, facial/tongue/throat swelling, SOB or lightheadedness with hypotension: Unknown Has patient had a PCN reaction causing severe rash involving mucus membranes or skin necrosis: Unknown Has patient had a PCN reaction that required hospitalization: Unknown Has patient had a PCN reaction occurring within the last 10 years: Unknown If all of the above answers are "NO", then may  proceed with Cephalosporin use.   Nsaids Other (See Comments)   GI upset   Rosuvastatin Other (See Comments)   Atorvastatin Rash   Celebrex [celecoxib] Other (See Comments)   Constipation and stomach upset   Felodipine Nausea Only   Oxaprozin Nausea Only    Rofecoxib Other (See Comments)   GI upset   Simvastatin Rash        Medication List     STOP taking these medications    mometasone 50 MCG/ACT nasal spray Commonly known as: NASONEX       TAKE these medications    acetaminophen 650 MG CR tablet Commonly known as: TYLENOL Take 650 mg by mouth every 8 (eight) hours as needed for pain.   albuterol (2.5 MG/3ML) 0.083% nebulizer solution Commonly known as: PROVENTIL Take 3 mLs (2.5 mg total) by nebulization every 4 (four) hours as needed for wheezing or shortness of breath.   amLODipine 10 MG tablet Commonly known as: NORVASC   calcitRIOL 0.25 MCG capsule Commonly known as: ROCALTROL Take 1 capsule by mouth daily.   cromolyn 4 % ophthalmic solution Commonly known as: OPTICROM SMARTSIG:In Eye(s)   docusate sodium 100 MG capsule Commonly known as: COLACE Take 100 mg by mouth 2 (two) times daily.   Ferrous Sulfate 142 (45 Fe) MG Tbcr Take 1 tablet by mouth 2 (two) times daily.   loratadine 10 MG tablet Commonly known as: CLARITIN Take 1 tablet (10 mg total) by mouth daily.   metoprolol succinate 25 MG 24 hr tablet Commonly known as: TOPROL-XL Take 12.5 mg by mouth daily.   olopatadine 0.1 % ophthalmic solution Commonly known as: PATANOL Place 1 drop into both eyes 2 (two) times daily as needed for allergies.   polyethylene glycol 17 g packet Commonly known as: MIRALAX / GLYCOLAX Take 17 g by mouth daily as needed for mild constipation or moderate constipation.   rivaroxaban 20 MG Tabs tablet Commonly known as: XARELTO Take 1 tablet (20 mg total) by mouth daily with supper.   traZODone 50 MG tablet Commonly known as: DESYREL Take 50 mg by mouth at bedtime.   Vitamin D3 50 MCG (2000 UT) capsule Take 4,000 Units by mouth daily.        Contact information for after-discharge care     Destination     [facility] Preferred SNF .   Service: Skilled Nursing Contact  information: [address] [place] [address] [phone]                    Discharge Exam: Nicole Bishop Weights   08/02/23 1318  Weight: 94.1 kg   General exam: Appears calm and comfortable  Respiratory system: Respiration with gurgling sounds. Respiratory effort normal. Cardiovascular system: Appear regular. No JVD, murmurs, rubs, gallops or clicks. No pedal edema. Gastrointestinal system: Abdomen is nondistended, soft and nontender. No organomegaly or masses felt. Normal bowel sounds heard. Central nervous system: Alert and oriented x1. No focal neurological deficits. Extremities: Symmetric 5 x 5 power. Skin: No rashes, lesions or ulcers Psychiatry: Flat affect        Condition at discharge: stable  The results of significant diagnostics from this hospitalization (including imaging, microbiology, ancillary and laboratory) are listed below for reference.   Imaging Studies: ECHOCARDIOGRAM COMPLETE Result Date: 08/02/2023    ECHOCARDIOGRAM REPORT   Patient Name:   Nicole Bishop Date of Exam: 08/02/2023 Medical Rec #:  [id number]       Height:  72.0 in Accession #:    [id number]      Weight:       207.5 lb Date of Birth:  04/17/1931        BSA:          2.164 m Patient Age:    89 years        BP:           109/67 mmHg Patient Gender: F               HR:           77 bpm. Exam Location:  [facility] Procedure: 2D Echo Indications:     CHF I50.31  History:         Patient has no prior history of Echocardiogram examinations.  Sonographer:     Overton Mam RDCS, FASE Referring Phys:  [id number] Emeline General Diagnosing Phys: Debbe Odea MD  Sonographer Comments: Image acquisition challenging due to uncooperative patient. IMPRESSIONS  1. Left ventricular ejection fraction, by estimation, is 60 to 65%. The left ventricle has normal function. The left ventricle has no regional wall motion abnormalities. There is mild left ventricular hypertrophy. Left ventricular  diastolic parameters are indeterminate.  2. Right ventricular systolic function is low normal. The right ventricular size is normal.  3. Left atrial size was mild to moderately dilated.  4. Right atrial size was moderately dilated.  5. A small pericardial effusion is present.  6. The mitral valve is normal in structure. Trivial mitral valve regurgitation.  7. Tricuspid valve regurgitation is moderate.  8. The aortic valve is tricuspid. Aortic valve regurgitation is not visualized. Aortic valve sclerosis/calcification is present, without any evidence of aortic stenosis.  9. The inferior vena cava is normal in size with greater than 50% respiratory variability, suggesting right atrial pressure of 3 mmHg. FINDINGS  Left Ventricle: Left ventricular ejection fraction, by estimation, is 60 to 65%. The left ventricle has normal function. The left ventricle has no regional wall motion abnormalities. The left ventricular internal cavity size was normal in size. There is  mild left ventricular hypertrophy. Left ventricular diastolic parameters are indeterminate. Right Ventricle: The right ventricular size is normal. No increase in right ventricular wall thickness. Right ventricular systolic function is low normal. Left Atrium: Left atrial size was mild to moderately dilated. Right Atrium: Right atrial size was moderately dilated. Pericardium: A small pericardial effusion is present. Mitral Valve: The mitral valve is normal in structure. Trivial mitral valve regurgitation. Tricuspid Valve: The tricuspid valve is normal in structure. Tricuspid valve regurgitation is moderate. Aortic Valve: The aortic valve is tricuspid. Aortic valve regurgitation is not visualized. Aortic valve sclerosis/calcification is present, without any evidence of aortic stenosis. Aortic valve peak gradient measures 10.5 mmHg. Pulmonic Valve: The pulmonic valve was normal in structure. Pulmonic valve regurgitation is mild to moderate. Aorta: The aortic  root is normal in size and structure. Venous: The inferior vena cava is normal in size with greater than 50% respiratory variability, suggesting right atrial pressure of 3 mmHg. IAS/Shunts: No atrial level shunt detected by color flow Doppler.  LEFT VENTRICLE PLAX 2D LVIDd:         3.50 cm   Diastology LVIDs:         2.20 cm   LV e' medial:   8.49 cm/s LV PW:         1.50 cm   LV E/e' medial: 8.6 LV IVS:        1.40 cm  LVOT diam:     2.20 cm LV SV:         64 LV SV Index:   30 LVOT Area:     3.80 cm  RIGHT VENTRICLE RV Basal diam:  4.00 cm RV S prime:     8.49 cm/s TAPSE (M-mode): 1.3 cm LEFT ATRIUM             Index        RIGHT ATRIUM           Index LA diam:        4.30 cm 1.99 cm/m   RA Area:     30.10 cm LA Vol (A2C):   78.1 ml 36.09 ml/m  RA Volume:   102.00 ml 47.13 ml/m LA Vol (A4C):   88.8 ml 41.03 ml/m LA Biplane Vol: 84.1 ml 38.86 ml/m  AORTIC VALVE                 PULMONIC VALVE AV Area (Vmax): 2.14 cm     PV Vmax:          1.07 m/s AV Vmax:        162.00 cm/s  PV Peak grad:     4.6 mmHg AV Peak Grad:   10.5 mmHg    PR End Diast Vel: 9.24 msec LVOT Vmax:      91.00 cm/s LVOT Vmean:     64.600 cm/s LVOT VTI:       0.169 m  AORTA                        PULMONARY ARTERY Ao Root diam: 3.60 cm        MPA diam:        3.30 cm MITRAL VALVE               TRICUSPID VALVE MV Area (PHT): 2.80 cm    TV Peak grad:   22.1 mmHg MV Decel Time: 271 msec    TV Vmax:        2.35 m/s MV E velocity: 72.80 cm/s                            SHUNTS                            Systemic VTI:  0.17 m                            Systemic Diam: 2.20 cm Debbe Odea MD Electronically signed by Debbe Odea MD Signature Date/Time: 08/02/2023/5:42:35 PM    Final    MR BRAIN WO CONTRAST Result Date: 08/02/2023 CLINICAL DATA:  Neuro deficit, acute, stroke suspected. EXAM: MRI HEAD WITHOUT CONTRAST TECHNIQUE: Multiplanar, multiecho pulse sequences of the brain and surrounding structures were obtained without intravenous  contrast. COMPARISON:  CT head today. FINDINGS: Motion limited study.  Within this limitation: Brain: Remote right cerebellar infarct. No evidence of acute infarct, acute hemorrhage, mass lesion, midline shift or hydrocephalus. Vascular: Major arterial flow voids are maintained at the skull base. Skull and upper cervical spine: Normal marrow signal. Sinuses/Orbits: Mild paranasal sinus mucosal thickening. No acute orbital findings. Other: No mastoid effusions. IMPRESSION: 1. No evidence of acute abnormality. 2. Remote right cerebellar infarct. Electronically Signed   By: Feliberto Harts M.D.   On: 08/02/2023 11:50  CT CHEST WO CONTRAST Result Date: 08/02/2023 CLINICAL DATA:  89 year old female status post fall. Weakness and foul smelling urine. EXAM: CT CHEST WITHOUT CONTRAST TECHNIQUE: Multidetector CT imaging of the chest was performed following the standard protocol without IV contrast. RADIATION DOSE REDUCTION: This exam was performed according to the departmental dose-optimization program which includes automated exposure control, adjustment of the mA and/or kV according to patient size and/or use of iterative reconstruction technique. COMPARISON:  Portable chest x-ray today.  Chest CT 04/30/2018. FINDINGS: Cardiovascular: Chronic cardiomegaly, moderate to severe enlargement of the central pulmonary arteries which may have progressed since 2019 (series 2, image 65). No pericardial effusion. Superimposed tortuous thoracic aorta with calcified atherosclerosis. Vascular patency is not evaluated in the absence of IV contrast. Mediastinum/Nodes: No mediastinal mass or lymphadenopathy. Moderate size gastric hiatal hernia is stable since 2019. Lungs/Pleura: Major airways remain patent with minimal retained secretions layering in the trachea on series 3, image 43. Asymmetrically decreased right lung volume since 2019 with confluent mostly dependent right lung opacity which is maximal in the lower lobe. Airway  thickening in that region, but no large airway occlusion. No pneumothorax. Trace if any pleural fluid in the right lung. Trace layering left pleural effusion, but otherwise the left lung is well aerated. Upper Abdomen: Partially visible juxtarenal or infrarenal abdominal aortic aneurysm, estimated at 39 mm diameter on sagittal and coronal reformatted images. This was not visible in 2019. Negative visible noncontrast liver, spleen, pancreas, adrenal glands, intra-abdominal stomach. Generalized large bowel diverticulosis. Renal vascular calcifications but also superimposed nephrolithiasis including developing staghorn calculi in the right kidney. No hydronephrosis is visible. No upper abdominal free air or free fluid. Musculoskeletal: Bulky spinal disc and endplate degeneration throughout. No acute or suspicious osseous lesion identified. IMPRESSION: 1. Severe central pulmonary artery enlargement which seems progressed since 2019, consistent with Pulmonary Artery Hypertension. Underlying cardiomegaly, Aortic Atherosclerosis (ICD10-I70.0). 2. Asymmetric right lung atelectasis with dependent opacity suspicious for acute lung infection. Associated thickening of the right lower lung bronchi. Minimal retained secretions in the trachea. Trace superimposed pleural fluid, layering more so in the left lung. 3. Abdominal Aortic Aneurysm, estimated at 39 mm diameter. Recommend follow-up ultrasound every 3 years. (Ref.: J Vasc Surg. 2018; 67:2-77 and J Am Coll Radiol 2013;10(10):789-794.) Aortic aneurysm NOS (ICD10-I71.9). 4. Chronic moderate size gastric hiatal hernia. Right renal staghorn calculi. Electronically Signed   By: Odessa Fleming M.D.   On: 08/02/2023 09:19   DG Chest Portable 1 View Result Date: 08/02/2023 CLINICAL DATA:  Frequent falls and weakness. EXAM: PORTABLE CHEST 1 VIEW COMPARISON:  Portable chest 08/23/2021, and chest CT 04/30/2018 FINDINGS: The patient is rotated to the right. The heart is moderately enlarged  and the aorta is tortuous and dilated with scattered calcification, exaggerating the mediastinum. There is also a large hiatal hernia. No vascular congestion is seen. There do appear to have developed small pleural effusions and there may be a hazy infiltrate beginning to form in the left base. There is also increased elevation of the right hemidiaphragm and increased opacity in the right base which could be due to atelectasis or consolidation. Additionally, there is fullness in the region of the right hilum which could be an asymmetrically enlarged right pulmonary artery or hilar mass. Remainder of the lungs are clear. Osteopenia and thoracic spondylosis. No new osseous findings. IMPRESSION: 1. Increased elevation of the right hemidiaphragm and increased opacity in the right base which could be due to atelectasis or consolidation. 2. Fullness in the region of  the right hilum which could be an asymmetrically enlarged right pulmonary artery or hilar mass. 3. Small pleural effusions and possible hazy infiltrate beginning to form in the left base. 4. Cardiomegaly and aortic atherosclerosis with uncoiling. 5. Large hiatal hernia. 6. Consider follow-up study with chest CT, preferably with contrast if possible. Electronically Signed   By: Almira Bar M.D.   On: 08/02/2023 06:16   CT HEAD WO CONTRAST ( ) Result Date: 08/02/2023 CLINICAL DATA:  89 year old female status post fall. Weakness and foul smelling urine. EXAM: CT HEAD WITHOUT CONTRAST TECHNIQUE: Contiguous axial images were obtained from the base of the skull through the vertex without intravenous contrast. RADIATION DOSE REDUCTION: This exam was performed according to the departmental dose-optimization program which includes automated exposure control, adjustment of the mA and/or kV according to patient size and/or use of iterative reconstruction technique. COMPARISON:  Head CT 08/23/2021. FINDINGS: Brain: Cerebral volume is stable and normal for age. No  midline shift, ventriculomegaly, mass effect, evidence of mass lesion, intracranial hemorrhage or evidence of cortically based acute infarction. Gray-white differentiation is stable, normal for age aside from several small chronic right cerebellar infarcts best demonstrated on coronal images. No other convincing encephalomalacia. Vascular: Calcified atherosclerosis at the skull base. No suspicious intracranial vascular hyperdensity. Skull: Stable.  No acute osseous abnormality identified. Sinuses/Orbits: Visualized paranasal sinuses and mastoids are stable, generally well aerated although there is chronic maxillary mucoperiosteal thickening. Other: No acute orbit or scalp soft tissue finding. IMPRESSION: 1. No acute intracranial abnormality or acute traumatic injury identified. 2. Stable non contrast CT appearance of the brain since last year with small chronic right cerebellar infarcts. Electronically Signed   By: Odessa Fleming M.D.   On: 08/02/2023 05:38    Microbiology: Results for orders placed or performed during the hospital encounter of 08/02/23  Blood culture (single)     Status: None   Collection Time: 08/02/23  6:59 AM   Specimen: BLOOD  Result Value Ref Range Status   Specimen Description BLOOD RIGHT ANTECUBITAL  Final   Special Requests   Final    BOTTLES DRAWN AEROBIC AND ANAEROBIC Blood Culture adequate volume   Culture   Final    NO GROWTH 5 DAYS Performed at [facility], [address] [place], [place] [address]    Report Status 08/07/2023 FINAL  Final    Labs: CBC: Recent Labs  Lab 08/01/23 2017 08/03/23 0226  WBC 4.5 4.6  HGB 11.1* 10.8*  HCT 34.0* 33.3*  MCV 89.0 88.6  PLT 184 151   Basic Metabolic Panel: Recent Labs  Lab 08/01/23 2017 08/03/23 0226 08/04/23 0434 08/07/23 0531  NA 135 140 137 136  K 3.4* 3.4* 4.2 4.3  CL 104 107 105 106  CO2 21* 24 27 23   GLUCOSE 108* 94 88 92  BUN 30* 29* 27* 30*  CREATININE 1.65* 1.35* 1.30* 1.38*  CALCIUM 8.8*  8.7* 8.7* 8.8*  MG  --   --  1.6*  --    Liver Function Tests: No results for input(s): "AST", "ALT", "ALKPHOS", "BILITOT", "PROT", "ALBUMIN" in the last 168 hours. CBG: Recent Labs  Lab 08/02/23 0533 08/06/23 0731  GLUCAP 102* 81    Discharge time spent: greater than 30 minutes.  Signed: Lucile Shutters, MD [facility] 08/07/2023

## 2023-08-07 NOTE — TOC Progression Note (Signed)
Transition of Care [facility]) - Progression Note    Patient Details  Name: Nicole Bishop MRN: [id number] Date of Birth: 1930-09-01  Transition of Care [facility]) CM/SW Contact  Allena Katz, LCSW Phone Number: 08/07/2023, 9:36 AM  Clinical Narrative:   Insurance requesting updated PT notes. CSW has shared with PT.    Expected Discharge Plan: Skilled Nursing Facility Barriers to Discharge: Continued Medical Work up  Expected Discharge Plan and Services       Living arrangements for the past 2 months: Single Family Home                                       Social Determinants of Health (SDOH) Interventions SDOH Screenings   Food Insecurity: Patient Unable To Answer (08/05/2023)  Housing: Unknown (08/05/2023)  Utilities: Not At Risk (04/11/2023)   [facility] from [facility]  Depression [phone]): Low Risk  (08/27/2018)  Financial Resource Strain: Low Risk  (04/11/2023)   Received from [facility]  Tobacco Use: Low Risk  (08/01/2023)    Readmission Risk Interventions     No data to display

## 2023-08-08 DIAGNOSIS — I10 Essential (primary) hypertension: Secondary | ICD-10-CM | POA: Diagnosis not present

## 2023-08-08 DIAGNOSIS — K5909 Other constipation: Secondary | ICD-10-CM | POA: Diagnosis not present

## 2023-08-08 DIAGNOSIS — I4891 Unspecified atrial fibrillation: Secondary | ICD-10-CM | POA: Diagnosis not present

## 2023-08-08 DIAGNOSIS — M25571 Pain in right ankle and joints of right foot: Secondary | ICD-10-CM | POA: Diagnosis not present

## 2023-08-11 DIAGNOSIS — D508 Other iron deficiency anemias: Secondary | ICD-10-CM | POA: Diagnosis not present

## 2023-08-11 DIAGNOSIS — I10 Essential (primary) hypertension: Secondary | ICD-10-CM | POA: Diagnosis not present

## 2023-08-11 NOTE — Consult Note (Signed)
[facility] Liaison Note  08/11/2023  Nicole Bishop May 14, 1931 [id number]  Location: RN Hospital Liaison screened the patient remotely at [facility].  Insurance: [facility] HMO   Nicole Bishop is a 89 y.o. female who is a Primary Care Patient of Hande, Roderic Palau, MD The patient was screened for readmission hospitalization with noted low risk score for unplanned readmission risk with 1 IP in 6 months.  The patient was assessed for potential Care Management service needs for post hospital transition for care coordination. Review of patient's electronic medical record reveals patient was admitted for Pneumonia. Pt recommend for SNF level of care for ongoing STR. Facility will continue to address pt's ongoing needs.    [facility] Care Management/Population Health does not replace or interfere with any arrangements made by the Inpatient Transition of Care team.   For questions contact:   Nicole Cousin, RN, [facility] Liaison [place]   [facility], Population Health Office Hours MTWF  8:00 am-6:00 pm Direct Dial: [phone] mobile [phone] [Office toll free line] Office Hours are M-F 8:30 - 5 pm [email or web address]

## 2023-08-12 DIAGNOSIS — J690 Pneumonitis due to inhalation of food and vomit: Secondary | ICD-10-CM | POA: Diagnosis not present

## 2023-08-14 DIAGNOSIS — M6281 Muscle weakness (generalized): Secondary | ICD-10-CM | POA: Diagnosis not present

## 2023-08-14 DIAGNOSIS — I4891 Unspecified atrial fibrillation: Secondary | ICD-10-CM | POA: Diagnosis not present

## 2023-08-15 DIAGNOSIS — I10 Essential (primary) hypertension: Secondary | ICD-10-CM | POA: Diagnosis not present

## 2023-08-18 DIAGNOSIS — I10 Essential (primary) hypertension: Secondary | ICD-10-CM | POA: Diagnosis not present

## 2023-08-21 DIAGNOSIS — R2681 Unsteadiness on feet: Secondary | ICD-10-CM | POA: Diagnosis not present

## 2023-08-21 DIAGNOSIS — M6259 Muscle wasting and atrophy, not elsewhere classified, multiple sites: Secondary | ICD-10-CM | POA: Diagnosis not present

## 2023-08-22 DIAGNOSIS — R2681 Unsteadiness on feet: Secondary | ICD-10-CM | POA: Diagnosis not present

## 2023-08-22 DIAGNOSIS — I10 Essential (primary) hypertension: Secondary | ICD-10-CM | POA: Diagnosis not present

## 2023-08-22 DIAGNOSIS — I4891 Unspecified atrial fibrillation: Secondary | ICD-10-CM | POA: Diagnosis not present

## 2023-08-22 DIAGNOSIS — M6259 Muscle wasting and atrophy, not elsewhere classified, multiple sites: Secondary | ICD-10-CM | POA: Diagnosis not present

## 2023-08-25 DIAGNOSIS — R4182 Altered mental status, unspecified: Secondary | ICD-10-CM | POA: Diagnosis not present

## 2023-08-26 DIAGNOSIS — R4182 Altered mental status, unspecified: Secondary | ICD-10-CM | POA: Diagnosis not present

## 2023-08-27 ENCOUNTER — Emergency Department: Payer: Medicare HMO

## 2023-08-27 ENCOUNTER — Inpatient Hospital Stay
Admission: EM | Admit: 2023-08-27 | Discharge: 2023-09-02 | DRG: 071 | Disposition: A | Discharge status: Hospice | Payer: Medicare HMO | Source: Skilled Nursing Facility | Attending: Internal Medicine | Admitting: Internal Medicine

## 2023-08-27 ENCOUNTER — Other Ambulatory Visit: Payer: Self-pay

## 2023-08-27 DIAGNOSIS — Z886 Allergy status to analgesic agent status: Secondary | ICD-10-CM

## 2023-08-27 DIAGNOSIS — Z823 Family history of stroke: Secondary | ICD-10-CM

## 2023-08-27 DIAGNOSIS — Z66 Do not resuscitate: Secondary | ICD-10-CM | POA: Diagnosis present

## 2023-08-27 DIAGNOSIS — G4733 Obstructive sleep apnea (adult) (pediatric): Secondary | ICD-10-CM | POA: Diagnosis present

## 2023-08-27 DIAGNOSIS — R404 Transient alteration of awareness: Secondary | ICD-10-CM | POA: Diagnosis not present

## 2023-08-27 DIAGNOSIS — Z96653 Presence of artificial knee joint, bilateral: Secondary | ICD-10-CM | POA: Diagnosis present

## 2023-08-27 DIAGNOSIS — D61818 Other pancytopenia: Secondary | ICD-10-CM | POA: Diagnosis present

## 2023-08-27 DIAGNOSIS — I2510 Atherosclerotic heart disease of native coronary artery without angina pectoris: Secondary | ICD-10-CM | POA: Diagnosis present

## 2023-08-27 DIAGNOSIS — I4891 Unspecified atrial fibrillation: Principal | ICD-10-CM

## 2023-08-27 DIAGNOSIS — G9340 Encephalopathy, unspecified: Secondary | ICD-10-CM | POA: Diagnosis not present

## 2023-08-27 DIAGNOSIS — Z880 Allergy status to penicillin: Secondary | ICD-10-CM

## 2023-08-27 DIAGNOSIS — D72819 Decreased white blood cell count, unspecified: Secondary | ICD-10-CM | POA: Diagnosis not present

## 2023-08-27 DIAGNOSIS — I482 Chronic atrial fibrillation, unspecified: Secondary | ICD-10-CM | POA: Diagnosis not present

## 2023-08-27 DIAGNOSIS — R1312 Dysphagia, oropharyngeal phase: Secondary | ICD-10-CM | POA: Diagnosis present

## 2023-08-27 DIAGNOSIS — Z515 Encounter for palliative care: Secondary | ICD-10-CM

## 2023-08-27 DIAGNOSIS — R5383 Other fatigue: Secondary | ICD-10-CM | POA: Diagnosis not present

## 2023-08-27 DIAGNOSIS — Z555 Less than a high school diploma: Secondary | ICD-10-CM

## 2023-08-27 DIAGNOSIS — I130 Hypertensive heart and chronic kidney disease with heart failure and stage 1 through stage 4 chronic kidney disease, or unspecified chronic kidney disease: Secondary | ICD-10-CM | POA: Diagnosis not present

## 2023-08-27 DIAGNOSIS — I160 Hypertensive urgency: Secondary | ICD-10-CM | POA: Diagnosis present

## 2023-08-27 DIAGNOSIS — I5032 Chronic diastolic (congestive) heart failure: Secondary | ICD-10-CM | POA: Diagnosis present

## 2023-08-27 DIAGNOSIS — K219 Gastro-esophageal reflux disease without esophagitis: Secondary | ICD-10-CM | POA: Diagnosis present

## 2023-08-27 DIAGNOSIS — Z8261 Family history of arthritis: Secondary | ICD-10-CM

## 2023-08-27 DIAGNOSIS — G9341 Metabolic encephalopathy: Principal | ICD-10-CM | POA: Diagnosis present

## 2023-08-27 DIAGNOSIS — Z7401 Bed confinement status: Secondary | ICD-10-CM | POA: Diagnosis not present

## 2023-08-27 DIAGNOSIS — R531 Weakness: Secondary | ICD-10-CM | POA: Diagnosis not present

## 2023-08-27 DIAGNOSIS — N184 Chronic kidney disease, stage 4 (severe): Secondary | ICD-10-CM | POA: Diagnosis present

## 2023-08-27 DIAGNOSIS — F0153 Vascular dementia, unspecified severity, with mood disturbance: Secondary | ICD-10-CM | POA: Diagnosis not present

## 2023-08-27 DIAGNOSIS — I480 Paroxysmal atrial fibrillation: Secondary | ICD-10-CM | POA: Diagnosis present

## 2023-08-27 DIAGNOSIS — F32A Depression, unspecified: Secondary | ICD-10-CM | POA: Diagnosis not present

## 2023-08-27 DIAGNOSIS — F419 Anxiety disorder, unspecified: Secondary | ICD-10-CM | POA: Diagnosis present

## 2023-08-27 DIAGNOSIS — I252 Old myocardial infarction: Secondary | ICD-10-CM

## 2023-08-27 DIAGNOSIS — N1831 Chronic kidney disease, stage 3a: Secondary | ICD-10-CM | POA: Diagnosis not present

## 2023-08-27 DIAGNOSIS — E785 Hyperlipidemia, unspecified: Secondary | ICD-10-CM | POA: Diagnosis present

## 2023-08-27 DIAGNOSIS — Z7901 Long term (current) use of anticoagulants: Secondary | ICD-10-CM

## 2023-08-27 DIAGNOSIS — I10 Essential (primary) hypertension: Secondary | ICD-10-CM | POA: Diagnosis not present

## 2023-08-27 DIAGNOSIS — Z8249 Family history of ischemic heart disease and other diseases of the circulatory system: Secondary | ICD-10-CM

## 2023-08-27 DIAGNOSIS — R001 Bradycardia, unspecified: Secondary | ICD-10-CM | POA: Diagnosis present

## 2023-08-27 DIAGNOSIS — R4189 Other symptoms and signs involving cognitive functions and awareness: Secondary | ICD-10-CM | POA: Diagnosis present

## 2023-08-27 DIAGNOSIS — R000 Tachycardia, unspecified: Secondary | ICD-10-CM | POA: Diagnosis not present

## 2023-08-27 DIAGNOSIS — E039 Hypothyroidism, unspecified: Secondary | ICD-10-CM | POA: Diagnosis present

## 2023-08-27 DIAGNOSIS — Z9049 Acquired absence of other specified parts of digestive tract: Secondary | ICD-10-CM

## 2023-08-27 DIAGNOSIS — N390 Urinary tract infection, site not specified: Secondary | ICD-10-CM | POA: Diagnosis not present

## 2023-08-27 DIAGNOSIS — Z888 Allergy status to other drugs, medicaments and biological substances status: Secondary | ICD-10-CM

## 2023-08-27 DIAGNOSIS — D509 Iron deficiency anemia, unspecified: Secondary | ICD-10-CM | POA: Diagnosis present

## 2023-08-27 DIAGNOSIS — R4182 Altered mental status, unspecified: Secondary | ICD-10-CM | POA: Diagnosis not present

## 2023-08-27 DIAGNOSIS — I771 Stricture of artery: Secondary | ICD-10-CM | POA: Diagnosis not present

## 2023-08-27 DIAGNOSIS — J9811 Atelectasis: Secondary | ICD-10-CM | POA: Diagnosis not present

## 2023-08-27 DIAGNOSIS — Z79899 Other long term (current) drug therapy: Secondary | ICD-10-CM

## 2023-08-27 LAB — URINALYSIS, ROUTINE W REFLEX MICROSCOPIC
Bilirubin Urine: NEGATIVE
Glucose, UA: NEGATIVE mg/dL
Hgb urine dipstick: NEGATIVE
Ketones, ur: NEGATIVE mg/dL
Leukocytes,Ua: NEGATIVE
Nitrite: NEGATIVE
Protein, ur: NEGATIVE mg/dL
Specific Gravity, Urine: 1.019 (ref 1.005–1.030)
pH: 5 (ref 5.0–8.0)

## 2023-08-27 LAB — RESP PANEL BY RT-PCR (RSV, FLU A&B, COVID)  RVPGX2
Influenza A by PCR: NEGATIVE
Influenza B by PCR: NEGATIVE
Resp Syncytial Virus by PCR: NEGATIVE
SARS Coronavirus 2 by RT PCR: NEGATIVE

## 2023-08-27 LAB — COMPREHENSIVE METABOLIC PANEL
ALT: 22 U/L (ref 0–44)
AST: 22 U/L (ref 15–41)
Albumin: 2.9 g/dL — ABNORMAL LOW (ref 3.5–5.0)
Alkaline Phosphatase: 104 U/L (ref 38–126)
Anion gap: 13 (ref 5–15)
BUN: 21 mg/dL (ref 8–23)
CO2: 23 mmol/L (ref 22–32)
Calcium: 9.5 mg/dL (ref 8.9–10.3)
Chloride: 109 mmol/L (ref 98–111)
Creatinine, Ser: 1.23 mg/dL — ABNORMAL HIGH (ref 0.44–1.00)
GFR, Estimated: 42 mL/min — ABNORMAL LOW (ref >60)
Glucose, Bld: 72 mg/dL (ref 70–99)
Potassium: 4.3 mmol/L (ref 3.5–5.1)
Sodium: 145 mmol/L (ref 135–145)
Total Bilirubin: 0.5 mg/dL (ref 0.0–1.2)
Total Protein: 6.4 g/dL — ABNORMAL LOW (ref 6.5–8.1)

## 2023-08-27 LAB — LACTIC ACID, PLASMA: Lactic Acid, Venous: 0.8 mmol/L (ref 0.5–1.9)

## 2023-08-27 LAB — CBC
HCT: 34.5 % — ABNORMAL LOW (ref 36.0–46.0)
Hemoglobin: 10.6 g/dL — ABNORMAL LOW (ref 12.0–15.0)
MCH: 28 pg (ref 26.0–34.0)
MCHC: 30.7 g/dL (ref 30.0–36.0)
MCV: 91.3 fL (ref 80.0–100.0)
Platelets: 101 10*3/uL — ABNORMAL LOW (ref 150–400)
RBC: 3.78 MIL/uL — ABNORMAL LOW (ref 3.87–5.11)
RDW: 16.6 % — ABNORMAL HIGH (ref 11.5–15.5)
WBC: 1.8 10*3/uL — ABNORMAL LOW (ref 4.0–10.5)
nRBC: 0 % (ref 0.0–0.2)

## 2023-08-27 LAB — LIPASE, BLOOD: Lipase: 21 U/L (ref 11–51)

## 2023-08-27 LAB — BRAIN NATRIURETIC PEPTIDE: B Natriuretic Peptide: 210.4 pg/mL — ABNORMAL HIGH (ref 0.0–100.0)

## 2023-08-27 MED ORDER — CROMOLYN SODIUM 4 % OP SOLN
2.0000 [drp] | Freq: Four times a day (QID) | OPHTHALMIC | Status: DC
  Filled 2023-08-27: qty 10

## 2023-08-27 MED ORDER — SODIUM CHLORIDE 0.9 % IV BOLUS
500.0000 mL | Freq: Once | INTRAVENOUS | Status: AC
  Administered 2023-08-27: 500 mL via INTRAVENOUS

## 2023-08-27 MED ORDER — CALCITRIOL 0.25 MCG PO CAPS
0.2500 ug | ORAL_CAPSULE | Freq: Every day | ORAL | Status: DC
  Filled 2023-08-27 (×2): qty 1

## 2023-08-27 MED ORDER — ACETAMINOPHEN 325 MG PO TABS
650.0000 mg | ORAL_TABLET | Freq: Four times a day (QID) | ORAL | Status: DC | PRN
  Administered 2023-08-29: 650 mg via ORAL
  Filled 2023-08-27: qty 2

## 2023-08-27 MED ORDER — LORATADINE 10 MG PO TABS
10.0000 mg | ORAL_TABLET | Freq: Every day | ORAL | Status: DC
  Administered 2023-08-29 – 2023-08-31 (×3): 10 mg via ORAL
  Filled 2023-08-27 (×3): qty 1

## 2023-08-27 MED ORDER — ONDANSETRON HCL 4 MG PO TABS: 4.0000 mg | ORAL_TABLET | Freq: Four times a day (QID) | ORAL | Status: DC | PRN

## 2023-08-27 MED ORDER — OLOPATADINE HCL 0.1 % OP SOLN: 1.0000 [drp] | Freq: Two times a day (BID) | OPHTHALMIC | Status: DC | PRN

## 2023-08-27 MED ORDER — MAGNESIUM HYDROXIDE 400 MG/5ML PO SUSP: 30.0000 mL | Freq: Every day | ORAL | Status: DC | PRN

## 2023-08-27 MED ORDER — VITAMIN D 25 MCG (1000 UNIT) PO TABS
4000.0000 [IU] | ORAL_TABLET | Freq: Every day | ORAL | Status: DC
  Administered 2023-08-29 – 2023-08-31 (×3): 4000 [IU] via ORAL
  Filled 2023-08-27 (×3): qty 4

## 2023-08-27 MED ORDER — TRAZODONE HCL 50 MG PO TABS: 25.0000 mg | ORAL_TABLET | Freq: Every evening | ORAL | Status: DC | PRN

## 2023-08-27 MED ORDER — FERROUS SULFATE 325 (65 FE) MG PO TABS
325.0000 mg | ORAL_TABLET | Freq: Two times a day (BID) | ORAL | Status: DC
  Administered 2023-08-29 – 2023-08-31 (×6): 325 mg via ORAL
  Filled 2023-08-27 (×6): qty 1

## 2023-08-27 MED ORDER — SODIUM CHLORIDE 0.9 % IV SOLN: INTRAVENOUS | Status: AC

## 2023-08-27 MED ORDER — TRAZODONE HCL 50 MG PO TABS
50.0000 mg | ORAL_TABLET | Freq: Every day | ORAL | Status: DC
  Administered 2023-08-29 – 2023-08-30 (×2): 50 mg via ORAL
  Filled 2023-08-27 (×2): qty 1

## 2023-08-27 MED ORDER — ALBUTEROL SULFATE (2.5 MG/3ML) 0.083% IN NEBU: 2.5000 mg | INHALATION_SOLUTION | RESPIRATORY_TRACT | Status: DC | PRN

## 2023-08-27 MED ORDER — HYDRALAZINE HCL 20 MG/ML IJ SOLN
10.0000 mg | Freq: Four times a day (QID) | INTRAMUSCULAR | Status: DC | PRN
  Administered 2023-08-28: 10 mg via INTRAVENOUS
  Filled 2023-08-27: qty 1

## 2023-08-27 MED ORDER — AMLODIPINE BESYLATE 5 MG PO TABS
5.0000 mg | ORAL_TABLET | Freq: Every day | ORAL | Status: DC
Start: 2023-08-28 — End: 2023-09-01
  Administered 2023-08-29 – 2023-08-31 (×3): 5 mg via ORAL
  Filled 2023-08-27 (×3): qty 1

## 2023-08-27 MED ORDER — ACETAMINOPHEN 650 MG RE SUPP: 650.0000 mg | Freq: Four times a day (QID) | RECTAL | Status: DC | PRN

## 2023-08-27 MED ORDER — RIVAROXABAN 20 MG PO TABS
20.0000 mg | ORAL_TABLET | Freq: Every day | ORAL | Status: DC
  Filled 2023-08-27: qty 1

## 2023-08-27 MED ORDER — DOCUSATE SODIUM 100 MG PO CAPS
100.0000 mg | ORAL_CAPSULE | Freq: Two times a day (BID) | ORAL | Status: DC
  Filled 2023-08-27: qty 1

## 2023-08-27 MED ORDER — ONDANSETRON HCL 4 MG/2ML IJ SOLN: 4.0000 mg | Freq: Four times a day (QID) | INTRAMUSCULAR | Status: DC | PRN

## 2023-08-27 MED ORDER — METOPROLOL SUCCINATE ER 25 MG PO TB24
12.5000 mg | ORAL_TABLET | Freq: Every day | ORAL | Status: DC
Start: 2023-08-28 — End: 2023-08-29
  Administered 2023-08-29: 12.5 mg via ORAL
  Filled 2023-08-27: qty 1

## 2023-08-27 NOTE — ED Provider Notes (Signed)
 [facility] Provider Note    Event Date/Time   First MD Initiated Contact with Patient 08/27/23 1635     (approximate)   History   Hypertension  EM caveat cognitive disorder/dementia  HPI : AYDEN APODACA is a 89 y.o. female   who per report had elevated blood pressure.  Patient's son reports that he heard from the nursing home that today she seemed like a little bit confused beyond her normal not as interactive.  He saw her yesterday at about 10:30 in the morning and she was conversational, today she small talk and seems very weak and fatigued.    PMH includes recent discharge in Dec  Aspiration pneumonia of right lower lobe (HCC) Active Problems:   CKD stage 3a, GFR 45-59 ml/min (HCC)   Paroxysmal atrial fibrillation (HCC)   Chronic diastolic CHF (congestive heart failure), NYHA class 3 (HCC)   Dementia, vascular with delirium (HCC)   CAP (community acquired pneumonia)   AMS (altered mental status)   Acute metabolic encephalopathy   Physical Exam   Triage Vital Signs: ED Triage Vitals [08/27/23 1525]  Encounter Vitals Group     BP (!) 135/113     Systolic BP Percentile      Diastolic BP Percentile      Pulse Rate 89     Resp 18     Temp 98 F (36.7 C)     Temp Source Oral     SpO2 99 %     Weight 207 lb 7.3 oz (94.1 kg)     Height 6' 2 (1.88 m)     Head Circumference      Peak Flow      Pain Score 0     Pain Loc      Pain Education      Exclude from Growth Chart     Most recent vital signs: Vitals:   08/27/23 1525 08/27/23 1950  BP: (!) 135/113 (!) 158/112  Pulse: 89 (!) 32  Resp: 18   Temp: 98 F (36.7 C)   SpO2: 99% 100%     General: Awake, no distress.  Normocephalic atraumatic.  Opens eyes, looks at examiner and son, when spoken to she seems to listen but does not speak.  She does not utter any words.  Son reports normally she is conversational CV:  Good peripheral perfusion.  Slight irregularity.  Mild  bradycardia heart rate approximately 55 Resp:  Normal effort.  Clear bilateral.  No obvious distress.  Diminished in the bases bilateral Abd:  No distention.  Soft nontender nondistended Other:  Slight pain through the right shoulder with range of motion which is noted by her son is chronic issue.  He also reports she has some mild chronic right-sided weakness.  No obvious focal deficits but somewhat hard to delineate.  Patient has fairly severe dementia, but also does not following complex commands at this time   ED Results / Procedures / Treatments   Labs (all labs ordered are listed, but only abnormal results are displayed) Labs Reviewed  CBC - Abnormal; Notable for the following components:      Result Value   WBC 1.8 (*)    RBC 3.78 (*)    Hemoglobin 10.6 (*)    HCT 34.5 (*)    RDW 16.6 (*)    Platelets 101 (*)    All other components within normal limits  COMPREHENSIVE METABOLIC PANEL - Abnormal; Notable for the following components:  Creatinine, Ser 1.23 (*)    Total Protein 6.4 (*)    Albumin 2.9 (*)    GFR, Estimated 42 (*)    All other components within normal limits  URINALYSIS, ROUTINE W REFLEX MICROSCOPIC - Abnormal; Notable for the following components:   Color, Urine YELLOW (*)    APPearance CLEAR (*)    All other components within normal limits  BRAIN NATRIURETIC PEPTIDE - Abnormal; Notable for the following components:   B Natriuretic Peptide 210.4 (*)    All other components within normal limits  RESP PANEL BY RT-PCR (RSV, FLU A&B, COVID)  RVPGX2  LIPASE, BLOOD  LACTIC ACID, PLASMA  CBC WITH DIFFERENTIAL/PLATELET  CBG MONITORING, ED     EKG  And interpreted by me at 1745 heart rate 45 QRS 90 QTc 420 Atrial fibrillation with slow ventricular response   No obvious ischemia    Please with the patient's son at the bedside he advises that he thinks years and years ago she had atrial fibrillation but nothing recently   RADIOLOGY   CT head  interpreted by me as grossly negative for acute hemorrhage  CT Head Wo Contrast Result Date: 08/27/2023 CLINICAL DATA:  Provided history: Altered mental status, non-traumatic. Additional history provided: High blood pressure, low heart rate, history of dementia. EXAM: CT HEAD WITHOUT CONTRAST TECHNIQUE: Contiguous axial images were obtained from the base of the skull through the vertex without intravenous contrast. RADIATION DOSE REDUCTION: This exam was performed according to the departmental dose-optimization program which includes automated exposure control, adjustment of the mA and/or kV according to patient size and/or use of iterative reconstruction technique. COMPARISON:  Brain MRI 1214.24.  Head CT 08/02/2023. FINDINGS: Brain: Generalized cerebral atrophy. Suspected small synechiae within the anterior bodies of both lateral ventricles, better appreciated on the prior brain MRI of 08/02/2023. 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 There is no acute intracranial hemorrhage. No demarcated cortical infarct. No extra-axial fluid collection. No evidence of an intracranial mass. No midline shift. Vascular: No hyperdense vessel.  Atherosclerotic calcifications. Skull: No calvarial fracture or aggressive osseous lesion. Sinuses/Orbits: No mass or acute finding within the imaged orbits. Mild mucosal thickening within the right frontal, bilateral ethmoid and bilateral maxillary sinuses. IMPRESSION: 1.  No evidence of an acute intracranial abnormality. 2. 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 3. Generalized cerebral atrophy. 4. Mild paranasal sinus mucosal thickening. Electronically Signed   By: Rockey Childs D.O.   On: 08/27/2023 18:43   DG Chest 1 View Result Date: 08/27/2023 CLINICAL DATA:  Lethargy EXAM: PORTABLE CHEST 1 VIEW COMPARISON:  08/02/2023 FINDINGS: Cardiac shadow is stable. Tortuous thoracic aorta is noted. Left lung remains clear.  Patient is significantly rotated to the right accentuating the mediastinal markings on the right. Mild right basilar atelectasis is noted. No pneumothorax is seen. No bony abnormality is noted. IMPRESSION: Mild right basilar atelectasis. Electronically Signed   By: Oneil Devonshire M.D.   On: 08/27/2023 18:25       PROCEDURES:  Critical Care performed: No  Procedures   MEDICATIONS ORDERED IN ED: Medications  sodium chloride  0.9 % bolus 500 mL (500 mLs Intravenous New Bag/Given 08/27/23 1953)     IMPRESSION / MDM / ASSESSMENT AND PLAN / ED COURSE  I reviewed the triage vital signs and the nursing notes.  Differential diagnosis includes, but is not limited to, dysrhythmia, paroxysmal A-fib, beta-blocker effect, electrolyte abnormality central neurologic cause infection etc.  Patient has bradycardia and depressed mental status but no evidence of hypotension.  Suspect patient may have reflex hypertension associated with slow A-fib.  Per the son she at least has a history of A-fib in the distant past she is on a beta-blocker which we will obviously hold.  No clear evidence of infection by urinalysis chest x-ray.  CT of the head reassuring without evidence of acute abnormality.  Discussed with the patient's son who is also at the bedside, understand agreeable with plan for admission.  Consulted with and patient accepted to hospital service by Dr. Lawence  On cardiac monitoring.  Patient's presentation is most consistent with acute complicated illness / injury requiring diagnostic workup.   The patient is on the cardiac monitor to evaluate for evidence of arrhythmia and/or significant heart rate changes.      FINAL CLINICAL IMPRESSION(S) / ED DIAGNOSES   Final diagnoses:  Atrial fibrillation with slow ventricular response (HCC)  Leukopenia, unspecified type  Encephalopathy, unspecified type     Rx / DC Orders   ED Discharge Orders     None         Note:  This document was prepared using Dragon voice recognition software and may include unintentional dictation errors.   Dicky Anes, MD 08/27/23 2013

## 2023-08-27 NOTE — ED Notes (Signed)
 Per pt son, pt is at baseline.

## 2023-08-27 NOTE — H&P (Addendum)
 [place]   PATIENT NAME: Nicole Bishop    MR#:  [id number]  DATE OF BIRTH:  April 30, 1931  DATE OF ADMISSION:  08/27/2023  PRIMARY CARE PHYSICIAN: Sadie Manna, MD   Patient is coming from: Home  REQUESTING/REFERRING PHYSICIAN: Dicky Anes, MD  CHIEF COMPLAINT:   Chief Complaint  Patient presents with  . Hypertension    HISTORY OF PRESENT ILLNESS:  KALKIDAN CAUDELL is a 89 y.o. African-American  female with medical history significant for anxiety, depression, GERD, hypertension, hypothyroidism, questionable history of atrial fibrillation, dementia, and coronary artery disease as well as OSA, who presented to the emergency room with acute onset of altered mental status with elevated blood pressure.  Per her son he was told that she seemed confused at her SNF and that was beyond her and has not been interactive as per usual.  She was conversational when she was seen yesterday at 10:30 AM.  She seemed to be very weak and fatigued today.  No reported fever or chills.  No reported nausea or vomiting or abdominal pain.  No reported cough or wheezing or dyspnea.  No reported chest pain or palpitations.  She is a fairly poor historian due to dementia.  ED Course: When she came to the ER BP was 173/122 with heart rate of 65 and later 37 and otherwise normal vital signs.  Labs revealed creatinine of 1.23 and albumin of 2.9 with total protein of 6.4.  BNP was 210.4 and lactic acid 0.8.  CBC showed leukopenia of 1.8 and hemoglobin 10.6 and hematocrit 34.5 with thrombocytopenia with platelets of 101.  Respiratory panel came back negative and UA came back negative.    EKG as reviewed by me : EKG showed atrial fibrillation with slow ventricular sponsor of 146. Imaging: Portable chest x-ray showed mild right basilar atelectasis.  Noncontrast head CT scan revealed no acute intracranial abnormality.  It showed known small chronic infarcts within the right cerebellar hemisphere and generalized  cerebral atrophy as well as mild paranasal sinus mucosal thickening.  The patient was given 500 mL IV normal saline bolus.  She will be admitted to a medical telemetry observation bed for further evaluation and management. PAST MEDICAL HISTORY:   Past Medical History:  Diagnosis Date  . Anemia   . Anxiety   . Arthritis   . Chronic kidney disease   . Coronary artery disease   . Depression   . Dizziness   . Dyspnea   . Dysrhythmia    gets tachy if anxious or excited  . GERD (gastroesophageal reflux disease)   . History of hiatal hernia   . HOH (hard of hearing)   . Hypertension   . Hypothyroidism   . IDA (iron  deficiency anemia) 12/14/2014  . Myocardial infarction (HCC) 1992  . Renal insufficiency   . Sleep apnea   . Tremors of nervous system     PAST SURGICAL HISTORY:   Past Surgical History:  Procedure Laterality Date  . CATARACT EXTRACTION W/PHACO Left 07/15/2017   Procedure: CATARACT EXTRACTION PHACO AND INTRAOCULAR LENS PLACEMENT (IOC)-LEFT;  Surgeon: Jaye Fallow, MD;  Location: [facility] ORS;  Service: Ophthalmology;  Laterality: Left;  US  00.36.1 AP% 13.9 CDE 5.02 Fluid Pack lot # [id number]  . CATARACT EXTRACTION W/PHACO Right 08/26/2017   Procedure: CATARACT EXTRACTION PHACO AND INTRAOCULAR LENS PLACEMENT (IOC);  Surgeon: Jaye Fallow, MD;  Location: [facility] ORS;  Service: Ophthalmology;  Laterality: Right;  US  00:41.0 AP% 15.3 CDE 6.27 FLUID PACK LOT #  [id number]  . CHOLECYSTECTOMY    . COLONOSCOPY WITH PROPOFOL  N/A 04/10/2015   Procedure: COLONOSCOPY WITH PROPOFOL ;  Surgeon: Lamar ONEIDA Holmes, MD;  Location: [facility] ENDOSCOPY;  Service: Endoscopy;  Laterality: N/A;  . ESOPHAGOGASTRODUODENOSCOPY (EGD) WITH PROPOFOL  N/A 04/10/2015   Procedure: ESOPHAGOGASTRODUODENOSCOPY (EGD) WITH PROPOFOL ;  Surgeon: Lamar ONEIDA Holmes, MD;  Location: [facility] ENDOSCOPY;  Service: Endoscopy;  Laterality: N/A;  . fatty tumor removed on left shoulder    . gallbladdedr removed    . JOINT  REPLACEMENT Bilateral    total knee replacements  . REPLACEMENT TOTAL KNEE BILATERAL Bilateral     SOCIAL HISTORY:   Social History   Tobacco Use  . Smoking status: Never  . Smokeless tobacco: Never  Substance Use Topics  . Alcohol  use: No    FAMILY HISTORY:   Family History  Problem Relation Age of Onset  . Arthritis Mother   . Hypertension Mother   . Stroke Mother   . Hypertension Father   . Heart attack Father   . Breast cancer Neg Hx     DRUG ALLERGIES:   Allergies  Allergen Reactions  . Penicillins Rash and Other (See Comments)    Has patient had a PCN reaction causing immediate rash, facial/tongue/throat swelling, SOB or lightheadedness with hypotension: Unknown Has patient had a PCN reaction causing severe rash involving mucus membranes or skin necrosis: Unknown Has patient had a PCN reaction that required hospitalization: Unknown Has patient had a PCN reaction occurring within the last 10 years: Unknown If all of the above answers are NO, then may proceed with Cephalosporin use.   . Nsaids Other (See Comments)    GI upset  . Rosuvastatin Other (See Comments)  . Atorvastatin Rash  . Celebrex [Celecoxib] Other (See Comments)    Constipation and stomach upset  . Felodipine Nausea Only  . Oxaprozin Nausea Only  . Rofecoxib Other (See Comments)    GI upset  . Simvastatin Rash    REVIEW OF SYSTEMS:   ROS As per history of present illness. All pertinent systems were reviewed above. Constitutional, HEENT, cardiovascular, respiratory, GI, GU, musculoskeletal, neuro, psychiatric, endocrine, integumentary and hematologic systems were reviewed and are otherwise negative/unremarkable except for positive findings mentioned above in the HPI.   MEDICATIONS AT HOME:   Prior to Admission medications   Medication Sig Start Date End Date Taking? Authorizing Provider  acetaminophen  (TYLENOL ) 650 MG CR tablet Take 650 mg by mouth every 8 (eight) hours as needed for  pain.    [provider]  albuterol  (PROVENTIL ) (2.5 MG/3ML) 0.083% nebulizer solution Take 3 mLs (2.5 mg total) by nebulization every 4 (four) hours as needed for wheezing or shortness of breath. 08/07/23   Lanetta Lingo, MD  amLODipine  (NORVASC ) 10 MG tablet  12/28/20   [provider]  calcitRIOL  (ROCALTROL ) 0.25 MCG capsule Take 1 capsule by mouth daily. 09/25/17   [provider]  Cholecalciferol  (VITAMIN D3) 2000 units capsule Take 4,000 Units by mouth daily.    [provider]  cromolyn  (OPTICROM ) 4 % ophthalmic solution SMARTSIG:In Eye(s) 01/12/20   [provider]  docusate sodium  (COLACE) 100 MG capsule Take 100 mg by mouth 2 (two) times daily. 06/03/14   [provider]  Ferrous Sulfate  142 (45 Fe) MG TBCR Take 1 tablet by mouth 2 (two) times daily.     [provider]  loratadine  (CLARITIN ) 10 MG tablet Take 1 tablet (10 mg total) by mouth daily. 02/12/13   Vannie Delon LABOR,  MD  metoprolol  succinate (TOPROL -XL) 25 MG 24 hr tablet Take 12.5 mg by mouth daily.    [provider]  olopatadine  (PATANOL) 0.1 % ophthalmic solution Place 1 drop into both eyes 2 (two) times daily as needed for allergies.     [provider]  polyethylene glycol (MIRALAX  / GLYCOLAX ) packet Take 17 g by mouth daily as needed for mild constipation or moderate constipation.    [provider]  rivaroxaban  (XARELTO ) 20 MG TABS tablet Take 1 tablet (20 mg total) by mouth daily with supper. 06/30/18   Barbette Cea, MD  traZODone  (DESYREL ) 50 MG tablet Take 50 mg by mouth at bedtime. 04/30/23   [provider]      VITAL SIGNS:  Blood pressure (!) 173/122, pulse (!) 37, temperature 98.3 F (36.8 C), temperature source Oral, resp. rate 14, height 6' 2 (1.88 m), weight 94.1 kg, SpO2 96%.  PHYSICAL EXAMINATION:  Physical Exam  GENERAL:  89 y.o.-year-old African-American female patient lying in the bed with no acute  distress.  EYES: Pupils equal, round, reactive to light and accommodation. No scleral icterus. Extraocular muscles intact.  HEENT: Head atraumatic, normocephalic. Oropharynx and nasopharynx clear.  NECK:  Supple, no jugular venous distention. No thyroid  enlargement, no tenderness.  LUNGS: Normal breath sounds bilaterally, no wheezing, rales,rhonchi or crepitation. No use of accessory muscles of respiration.  CARDIOVASCULAR:Irregularly  irregular rhythm d rhythm, S1, S2 normal. No murmurs, rubs, or gallops.  ABDOMEN: Soft, nondistended, nontender. Bowel sounds present. No organomegaly or mass.  EXTREMITIES: No pedal edema, cyanosis, or clubbing.  NEUROLOGIC: Cranial nerves II through XII are intact. Muscle strength 5/5 in all extremities. Sensation intact. Gait not checked.  PSYCHIATRIC: The patient is somnolent but arousable.SABRA SKIN: No obvious rash, lesion, or ulcer.   LABORATORY PANEL:   CBC Recent Labs  Lab 08/27/23 1730  WBC 1.8*  HGB 10.6*  HCT 34.5*  PLT 101*   ------------------------------------------------------------------------------------------------------------------  Chemistries  Recent Labs  Lab 08/27/23 1730  NA 145  K 4.3  CL 109  CO2 23  GLUCOSE 72  BUN 21  CREATININE 1.23*  CALCIUM 9.5  AST 22  ALT 22  ALKPHOS 104  BILITOT 0.5   ------------------------------------------------------------------------------------------------------------------  Cardiac Enzymes No results for input(s): TROPONINI in the last 168 hours. ------------------------------------------------------------------------------------------------------------------  RADIOLOGY:  CT Head Wo Contrast Result Date: 08/27/2023 CLINICAL DATA:  Provided history: Altered mental status, non-traumatic. Additional history provided: High blood pressure, low heart rate, history of dementia. EXAM: CT HEAD WITHOUT CONTRAST TECHNIQUE: Contiguous axial images were obtained from the base of the skull  through the vertex without intravenous contrast. RADIATION DOSE REDUCTION: This exam was performed according to the departmental dose-optimization program which includes automated exposure control, adjustment of the mA and/or kV according to patient size and/or use of iterative reconstruction technique. COMPARISON:  Brain MRI 1214.24.  Head CT 08/02/2023. FINDINGS: Brain: Generalized cerebral atrophy. Suspected small synechiae within the anterior bodies of both lateral ventricles, better appreciated on the prior brain MRI of 08/02/2023. 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 There is no acute intracranial hemorrhage. No demarcated cortical infarct. No extra-axial fluid collection. No evidence of an intracranial mass. No midline shift. Vascular: No hyperdense vessel.  Atherosclerotic calcifications. Skull: No calvarial fracture or aggressive osseous lesion. Sinuses/Orbits: No mass or acute finding within the imaged orbits. Mild mucosal thickening within the right frontal, bilateral ethmoid and bilateral maxillary sinuses. IMPRESSION: 1.  No evidence of an acute intracranial abnormality. 2.  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 3. Generalized cerebral atrophy. 4. Mild paranasal sinus mucosal thickening. Electronically Signed   By: Rockey Childs D.O.   On: 08/27/2023 18:43   DG Chest 1 View Result Date: 08/27/2023 CLINICAL DATA:  Lethargy EXAM: PORTABLE CHEST 1 VIEW COMPARISON:  08/02/2023 FINDINGS: Cardiac shadow is stable. Tortuous thoracic aorta is noted. Left lung remains clear. Patient is significantly rotated to the right accentuating the mediastinal markings on the right. Mild right basilar atelectasis is noted. No pneumothorax is seen. No bony abnormality is noted. IMPRESSION: Mild right basilar atelectasis. Electronically Signed   By: Oneil Devonshire M.D.   On: 08/27/2023 18:25      IMPRESSION AND PLAN:  Assessment and Plan: * Acute  encephalopathy - This could be related to paroxysmal atrial fibrillation.  This could also be related to hypertensive urgency specially with elevated blood pressure in the ER that was up to 173/122 and that could have happened as well in her SNF. - She will be admitted to a medical telemetry observation bed. - Will manage her hypertensive urgency. - We will continue her antihypertensive therapy. - We will monitor for paroxysmal atrial fibrillation. - Cardiology consult will be obtained. - I notified Dr. Ammon about the patient.  Hypertensive urgency - Will continue antihypertensive therapy. - She will be placed on as needed IV hydralazine .  Paroxysmal atrial fibrillation (HCC) - We will continue Xarelto  and Toprol -XL.  Depression - We will continue antidepressant therapy.   DVT prophylaxis: Lovenox.  Advanced Care Planning:  Code Status: The patient is DNR and DNI. Family Communication:  The plan of care was discussed in details with the patient (and family). I answered all questions. The patient agreed to proceed with the above mentioned plan. Further management will depend upon hospital course. Disposition Plan: Back to previous home environment Consults called: Cardiology. All the records are reviewed and case discussed with ED provider.  Status is: Observation  I certify that at the time of admission, it is my clinical judgment that the patient will require hospital care extending less than 2 midnights.                            Dispo: The patient is from: Home              Anticipated d/c is to: Home              Patient currently is not medically stable to d/c.              Difficult to place patient: No  Madison DELENA Peaches M.D on 08/28/2023 at 1:16 AM  [facility]   From 7 PM-7 AM, contact night-coverage [email or web address]  CC: Primary care physician; Sadie Manna, MD

## 2023-08-27 NOTE — ED Triage Notes (Signed)
 Pt son sts that pt has been having really high BP and a low HR per [facility] where pt currently resides. Son sts that pt has dementia and is at her baseline.

## 2023-08-28 DIAGNOSIS — G9340 Encephalopathy, unspecified: Secondary | ICD-10-CM | POA: Diagnosis not present

## 2023-08-28 DIAGNOSIS — D61818 Other pancytopenia: Secondary | ICD-10-CM

## 2023-08-28 DIAGNOSIS — I160 Hypertensive urgency: Secondary | ICD-10-CM

## 2023-08-28 DIAGNOSIS — I10 Essential (primary) hypertension: Secondary | ICD-10-CM | POA: Diagnosis not present

## 2023-08-28 DIAGNOSIS — I482 Chronic atrial fibrillation, unspecified: Secondary | ICD-10-CM | POA: Diagnosis not present

## 2023-08-28 DIAGNOSIS — I480 Paroxysmal atrial fibrillation: Secondary | ICD-10-CM | POA: Diagnosis not present

## 2023-08-28 DIAGNOSIS — R001 Bradycardia, unspecified: Secondary | ICD-10-CM | POA: Diagnosis not present

## 2023-08-28 DIAGNOSIS — N1831 Chronic kidney disease, stage 3a: Secondary | ICD-10-CM | POA: Diagnosis not present

## 2023-08-28 DIAGNOSIS — R4182 Altered mental status, unspecified: Secondary | ICD-10-CM | POA: Diagnosis not present

## 2023-08-28 LAB — CBC WITH DIFFERENTIAL/PLATELET
Abs Immature Granulocytes: 0 10*3/uL (ref 0.00–0.07)
Basophils Absolute: 0 10*3/uL (ref 0.0–0.1)
Basophils Relative: 1 %
Eosinophils Absolute: 0 10*3/uL (ref 0.0–0.5)
Eosinophils Relative: 2 %
HCT: 34.5 % — ABNORMAL LOW (ref 36.0–46.0)
Hemoglobin: 10.9 g/dL — ABNORMAL LOW (ref 12.0–15.0)
Immature Granulocytes: 0 %
Lymphocytes Relative: 38 %
Lymphs Abs: 0.6 10*3/uL — ABNORMAL LOW (ref 0.7–4.0)
MCH: 28.5 pg (ref 26.0–34.0)
MCHC: 31.6 g/dL (ref 30.0–36.0)
MCV: 90.3 fL (ref 80.0–100.0)
Monocytes Absolute: 0.2 10*3/uL (ref 0.1–1.0)
Monocytes Relative: 10 %
Neutro Abs: 0.8 10*3/uL — ABNORMAL LOW (ref 1.7–7.7)
Neutrophils Relative %: 49 %
Platelets: 87 10*3/uL — ABNORMAL LOW (ref 150–400)
RBC: 3.82 MIL/uL — ABNORMAL LOW (ref 3.87–5.11)
RDW: 16.4 % — ABNORMAL HIGH (ref 11.5–15.5)
WBC: 1.6 10*3/uL — ABNORMAL LOW (ref 4.0–10.5)
nRBC: 0 % (ref 0.0–0.2)

## 2023-08-28 LAB — BASIC METABOLIC PANEL
Anion gap: 9 (ref 5–15)
BUN: 21 mg/dL (ref 8–23)
CO2: 26 mmol/L (ref 22–32)
Calcium: 9.3 mg/dL (ref 8.9–10.3)
Chloride: 109 mmol/L (ref 98–111)
Creatinine, Ser: 1.24 mg/dL — ABNORMAL HIGH (ref 0.44–1.00)
GFR, Estimated: 41 mL/min — ABNORMAL LOW (ref >60)
Glucose, Bld: 74 mg/dL (ref 70–99)
Potassium: 3.8 mmol/L (ref 3.5–5.1)
Sodium: 144 mmol/L (ref 135–145)

## 2023-08-28 MED ORDER — RIVAROXABAN 15 MG PO TABS
15.0000 mg | ORAL_TABLET | Freq: Every day | ORAL | Status: DC
Start: 2023-08-28 — End: 2023-09-01
  Administered 2023-08-29 – 2023-08-31 (×3): 15 mg via ORAL
  Filled 2023-08-28 (×4): qty 1

## 2023-08-28 NOTE — ED Notes (Signed)
 RN unable to give pt meds as family is refusing pt to be given them as pt is not responsive to RN or them. RN attempted to wake up however pt is not able to follow commands.

## 2023-08-28 NOTE — ED Notes (Signed)
 This RN assumed care at 1900 and will complete documentation and assessment starting at this time due to not being at facility prior to this time. Fall Risk, Broset, and Head to Toe overdue by 6hrs prior to this RN assuming care. For this RN assessment please see updated primary assessment completed during this assigned shift that started today at 1900. At this time, patient is laying comfortably in room with family at bedside, no acute distress noted.

## 2023-08-28 NOTE — ED Notes (Signed)
 Pt provided warm blankets , pt opens eyes [employer] attempts to provide pt with pillow for head pt winces when head is moved pillows placed under LUE for comfort 22g piv appears cdi patent secured infusing .9 ns 1L per order pt VSS @ this time pt head >45 degrees siderails up call light within reach

## 2023-08-28 NOTE — Care Management Obs Status (Signed)
 MEDICARE OBSERVATION STATUS NOTIFICATION   Patient Details  Name: Nicole Bishop MRN: [id number] Date of Birth: 10-15-1930   Medicare Observation Status Notification Given:  Yes    [name] 08/28/2023, 3:12 PM

## 2023-08-28 NOTE — TOC Initial Note (Signed)
 Transition of Care [facility]) - Initial/Assessment Note    Patient Details  Name: Nicole Bishop [name] Date of Birth: Jun 03, 1931  [facility]) CM/SW Contact:    Lauraine JAYSON Carpen, LCSW Phone Number: 08/28/2023, 12:26 PM  Clinical Narrative:   Patient not oriented. Two sons at bedside. CSW introduced role and explained that discharge planning would be discussed. Patient was admitted from [facility] where she was receiving rehab. She has been there 20 days (12/19-1/8). Per admissions coordinator, if she returns, she will have to pay $1466.50 up front for 7 days of copays. Sons are aware and will discuss. Discussed potential for finding a SNF that can work out a payment plan for after rehab. CSW asked MD for PT and OT consults when appropriate. No further concerns. CSW will continue to follow patient and her family for support and facilitate discharge once medically stable.               Expected Discharge Plan: Skilled Nursing Facility Barriers to Discharge: Continued Medical Work up   Patient Goals and CMS Choice            Expected Discharge Plan and Services     Post Acute Care Choice:  (TBD) Living arrangements for the past 2 months: Apartment                                      Prior Living Arrangements/Services Living arrangements for the past 2 months: Apartment   Patient language and need for interpreter reviewed:: Yes        Need for Family Participation in Patient Care: Yes (Comment) Care giver support system in place?: Yes (comment)   Criminal Activity/Legal Involvement Pertinent to Current Situation/Hospitalization: No - Comment as needed  Activities of Daily Living      Permission Sought/Granted Permission sought to share information with : Family Supports    Share Information with NAME: Jesseka Drinkard     Permission granted to share info w Relationship: Son  Permission granted to share info w Contact Information:  [phone]  Emotional Assessment Appearance:: Appears stated age Attitude/Demeanor/Rapport: Unable to Assess Affect (typically observed): Unable to Assess Orientation: :  (DOx4) Alcohol  / Substance Use: Not Applicable Psych Involvement: No (comment)  Admission diagnosis:  Acute encephalopathy [G93.40] Patient Active Problem List   Diagnosis Date Noted   Hypertensive urgency 08/28/2023   Acute encephalopathy 08/27/2023   Acute metabolic encephalopathy 08/03/2023   CAP (community acquired pneumonia) 08/02/2023   AMS (altered mental status) 08/02/2023   Aspiration pneumonia of right lower lobe (HCC) 08/02/2023   Right renal artery stenosis (HCC) 03/01/2020   Weight loss 09/27/2019   Dementia, vascular with delirium (HCC) 07/27/2019   Abdominal aortic aneurysm (AAA) without rupture (HCC) 05/24/2019   Anemia in chronic kidney disease 05/06/2019   Edema of lower extremity 05/06/2019   Hyperparathyroidism due to renal insufficiency (HCC) 05/05/2019   Paroxysmal atrial fibrillation (HCC) 11/02/2018   Chronic venous insufficiency 06/23/2018   Lymphedema 06/23/2018   DVT (deep venous thrombosis) (HCC) 06/09/2018   Major depressive disorder, recurrent, mild (HCC) 05/06/2018   Abnormal gait 12/03/2017   Chronic diastolic CHF (congestive heart failure), NYHA class 3 (HCC) 10/29/2017   Mild dementia (HCC) 09/29/2017   Sleep behavior disorder, REM 09/29/2017   Visual hallucinations 07/16/2017   SOB (shortness of breath) on exertion 10/30/2016   Depression, major, single episode, complete  remission (HCC) 08/03/2016   Renal failure 03/17/2015   CKD stage 3a, GFR 45-59 ml/min (HCC) 12/22/2014   Anemia, iron  deficiency 12/22/2014   IDA (iron  deficiency anemia) 12/14/2014   Chronic constipation 06/03/2014   Essential (primary) hypertension 05/03/2014   Acid reflux 04/20/2014   H/O: obesity 04/20/2014   Airway hyperreactivity 04/20/2014   Arthritis of shoulder region, degenerative  01/27/2014   B12 deficiency 12/27/2013   Dizziness 03/13/2012   Back ache 11/04/2011   Avitaminosis D 11/04/2011   Mixed hyperlipidemia 10/31/2011   Symptomatic anemia 10/31/2011   Depression 05/21/2011   Hearing loss 05/21/2011   PCP:  Sadie Manna, MD Pharmacy:   [facility] [address], [place] - [address] [place] [address] Phone: [phone] Fax: [phone]     Social Drivers of Health (SDOH) Social History: SDOH Screenings   Food Insecurity: Patient Unable To Answer (08/05/2023)  Housing: Unknown (08/05/2023)  Utilities: Not At Risk (04/11/2023)   Received from [facility]  Depression [id number]): Low Risk  (08/27/2018)  Financial Resource Strain: Low Risk  (04/11/2023)   Received from [facility]  Tobacco Use: Low Risk  (08/01/2023)   SDOH Interventions:     Readmission Risk Interventions     No data to display

## 2023-08-28 NOTE — ED Notes (Signed)
 Patient sleeping, depends dry and no apparent needs at this time.

## 2023-08-28 NOTE — Assessment & Plan Note (Signed)
-   Will continue antihypertensive therapy. - She will be placed on as needed IV hydralazine.

## 2023-08-28 NOTE — Assessment & Plan Note (Signed)
-   We will continue Xarelto and Toprol-XL

## 2023-08-28 NOTE — Care Management Obs Status (Deleted)
 MEDICARE OBSERVATION STATUS NOTIFICATION   Patient Details  Name: Nicole Bishop MRN: [id number] Date of Birth: [birth year]   Medicare Observation Status Notification Given:       [name] 08/28/2023, 3:07 PM

## 2023-08-28 NOTE — Care Management Obs Status (Signed)
 MEDICARE OBSERVATION STATUS NOTIFICATION   Patient Details  Name: Nicole Bishop MRN: [id number] Date of Birth: 23-Mar-1931   Medicare Observation Status Notification Given:  Yes    [name] 08/28/2023, 3:09 PM

## 2023-08-28 NOTE — Evaluation (Signed)
 Clinical/Bedside Swallow Evaluation Patient Details  Name: Nicole Bishop [name] Date of Birth: [birth year]  Today's Date: 08/28/2023 Time: SLP Start Time (ACUTE ONLY): 1105 SLP Stop Time (ACUTE ONLY): 1130 SLP Time Calculation (min) (ACUTE ONLY): 25 min  Past Medical History:  Past Medical History:  Diagnosis Date   Anemia    Anxiety    Arthritis    Chronic kidney disease    Coronary artery disease    Depression    Dizziness    Dyspnea    Dysrhythmia    gets tachy if anxious or excited   GERD (gastroesophageal reflux disease)    History of hiatal hernia    HOH (hard of hearing)    Hypertension    Hypothyroidism    IDA (iron  deficiency anemia) 12/14/2014   Myocardial infarction [facility]) 1992   Renal insufficiency    Sleep apnea    Tremors of nervous system    Past Surgical History:  Past Surgical History:  Procedure Laterality Date   CATARACT EXTRACTION W/PHACO Left 07/15/2017   Procedure: CATARACT EXTRACTION PHACO AND INTRAOCULAR LENS PLACEMENT (IOC)-LEFT;  Surgeon: Jaye Fallow, MD;  Location: [facility] ORS;  Service: Ophthalmology;  Laterality: Left;  US  00.36.1 AP% 13.9 CDE 5.02 Fluid Pack lot # [id number]   CATARACT EXTRACTION W/PHACO Right 08/26/2017   Procedure: CATARACT EXTRACTION PHACO AND INTRAOCULAR LENS PLACEMENT (IOC);  Surgeon: Jaye Fallow, MD;  Location: [facility] ORS;  Service: Ophthalmology;  Laterality: Right;  US  00:41.0 AP% 15.3 CDE 6.27 FLUID PACK LOT # [id number]   CHOLECYSTECTOMY     COLONOSCOPY WITH PROPOFOL  N/A 04/10/2015   Procedure: COLONOSCOPY WITH PROPOFOL ;  Surgeon: [place] ONEIDA Holmes, MD;  Location: [facility] ENDOSCOPY;  Service: Endoscopy;  Laterality: N/A;   ESOPHAGOGASTRODUODENOSCOPY (EGD) WITH PROPOFOL  N/A 04/10/2015   Procedure: ESOPHAGOGASTRODUODENOSCOPY (EGD) WITH PROPOFOL ;  Surgeon: [place] ONEIDA Holmes, MD;  Location: [facility] ENDOSCOPY;  Service: Endoscopy;  Laterality: N/A;   fatty tumor removed on left shoulder     gallbladdedr removed      JOINT REPLACEMENT Bilateral    total knee replacements   REPLACEMENT TOTAL KNEE BILATERAL Bilateral    HPI:  Nicole Bishop is a 89 y.o. African-American  female with medical history significant for anxiety, depression, GERD, hypertension, hypothyroidism, questionable history of atrial fibrillation, dementia, and coronary artery disease as well as OSA, who presented to the emergency room with acute onset of altered mental status with elevated blood pressure. Pt is currently on a regular solids and thin liquids diet and room air. CT Head 08/27/23: No evidence of an acute intracranial abnormality. 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 Generalized cerebral atrophy. Mild paranasal sinus mucosal thickening. CXR 08/27/23: Mild right basilar atelectasis. BSE completed on 08/02/23 with recommendations for mech soft solids and thin liquids.    Assessment / Plan / Recommendation  Clinical Impression  Pt seen for bedside swallow assessment in the setting of AMS. Pt alert and agreeable to upright positioning facilitated by SLP. Pt on room air, O2 sats at 98 throughout session. Oral mech exam limited secondary to cognitive factors impeding following commands. No overt asymmetry or weakness. Pt edentulous- dentures not present, but son reports plan to bring to hospital. PO trials attempted for thin liquids, puree, and regular solids, though pt refusing all trials, maintaining a pursed lip posture for all attempts despite max encouragement from SLP and sons.   Therefore, focus of session turned to education for aspiration precautions (sitting upright, eating when alert, slow rate, and  small bites), modifying solids for increased ease of mastication (extra sauces and cut meats), and factors increasing risk for aspiration (hx of esophageal dysfunction, age, deconditioning, cognition). Review completed of recent BSE completed on 08/02/23. Sons reported understanding across education. SLP unable  to confirm diet recommendation based on lack of PO trials observed. Recommend dietary consult (sons report pt has had recent decline in PO intake in the last 2 days) and palliative consult for discussion of goals of care. MD and RN made aware. SLP will continue to follow for further education and re-attempt PO trials to ensure safe diet recommendations. SLP Visit Diagnosis: Dysphagia, unspecified (R13.10)    Aspiration Risk  Moderate aspiration risk    Diet Recommendation    (defer to medical team recommendations based on limited assessment of PO)  Medication Administration:  (reported to take medication whole in puree since last admission)    Other  Recommendations Oral Care Recommendations: Oral care BID;Oral care before and after PO;Staff/trained caregiver to provide oral care    Recommendations for follow up therapy are one component of a multi-disciplinary discharge planning process, led by the attending physician.  Recommendations may be updated based on patient status, additional functional criteria and insurance authorization.  Follow up Recommendations No SLP follow up (none expected)      Assistance Recommended at Discharge    Functional Status Assessment Patient has had a recent decline in their functional status and demonstrates the ability to make significant improvements in function in a reasonable and predictable amount of time.  Frequency and Duration min 1 x/week  1 week       Prognosis Prognosis for improved oropharyngeal function: Fair Barriers to Reach Goals: Cognitive deficits;Behavior Barriers/Prognosis Comment: baseline Hiatal Hernia; GERD; Dementia      Swallow Study   General Date of Onset: 08/28/23 HPI: Nicole Bishop is a 89 y.o. African-American  female with medical history significant for anxiety, depression, GERD, hypertension, hypothyroidism, questionable history of atrial fibrillation, dementia, and coronary artery disease as well as OSA, who  presented to the emergency room with acute onset of altered mental status with elevated blood pressure. Pt is currently on a regular solids and thin liquids diet and room air. CT Head 08/27/23: No evidence of an acute intracranial abnormality. 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 Generalized cerebral atrophy. Mild paranasal sinus mucosal thickening. CXR 08/27/23: Mild right basilar atelectasis. BSE completed on 08/02/23 with recommendations for mech soft solids and thin liquids. Type of Study: Bedside Swallow Evaluation Previous Swallow Assessment: 08/02/23 Diet Prior to this Study: Regular;Thin liquids (Level 0) Temperature Spikes Noted: No Respiratory Status: Room air History of Recent Intubation: No Behavior/Cognition: Alert;Confused;Distractible;Uncooperative Oral Cavity Assessment: Within Functional Limits Oral Care Completed by SLP: No (pt refused) Oral Cavity - Dentition: Edentulous (dentures not present in room) Vision:  (unable to assess) Self-Feeding Abilities:  (unable to assess- previous documented as dependent feeder) Patient Positioning: Upright in bed Baseline Vocal Quality: Low vocal intensity Volitional Cough: Cognitively unable to elicit Volitional Swallow: Unable to elicit    Oral/Motor/Sensory Function Overall Oral Motor/Sensory Function: Within functional limits (based on limited assessment)   Ice Chips Other Comments: pt refused   Thin Liquid Other Comments: pt refused    Nectar Thick Nectar Thick Liquid: Not tested   Honey Thick Honey Thick Liquid: Not tested   Puree Other Comments: pt refused   Solid     Other Comments: pt refused     Nicole Antrim Clapp  MS  CCC SLP   Nicole Bishop 08/28/2023,12:11 PM

## 2023-08-28 NOTE — Progress Notes (Signed)
 Progress Note   Patient: Nicole Bishop FMW:[id number] DOB: [birth year] DOA: 08/27/2023     0 DOS: the patient was seen and examined on 08/28/2023   Brief hospital course: Nicole Bishop is a 89 y.o. African-American  female with medical history significant for anxiety, depression, GERD, hypertension, hypothyroidism, questionable history of atrial fibrillation, dementia, and coronary artery disease as well as OSA, who presented to the emergency room with acute onset of altered mental status with elevated blood pressure.   Admitted to hospitalist service for metabolic encephalopathy, HTN urgency. Cardiology consulted for h/o Afib.  Assessment and Plan: * Acute encephalopathy Recent admission for aspiration pneumonia. Infectious source so far negative. Possible worsening dementia, delirium. Her overall PO intake is poor. SLP unable to be completed due to her poor mental status. She is at risk for aspiration per SLP.  Hypertensive urgency BP improved, Continue home dose Norvasc , metoprolol . IV hydralazine  as needed for elevated BP  Paroxysmal atrial fibrillation (HCC) Continue Xarelto  and Toprol -XL. HR lower side. Continue telemetry monitoring.  CKD stage 3a Kidney function stable. Avoid nephrotoxic drugs.  Pancytopenia Unknown etiology. Trend daily CBC.    Nursing supportive care. Fall, aspiration precautions. DVT prophylaxis  Xarelto .   Code Status: Limited: Do not attempt resuscitation (DNR) -DNR-LIMITED -Do Not Intubate/DNI   Subjective: Patient is seen and examined today morning. She is mumbling, does not follow commands. Unable to do SLP eval. BP improved. HR lower side.  Physical Exam: Vitals:   08/28/23 1230 08/28/23 1257 08/28/23 1712 08/28/23 1730  BP: (!) 114/101  107/77 128/89  Pulse: 64  64 (!) 57  Resp: (!) 9  18 15   Temp:  98 F (36.7 C) 97.6 F (36.4 C)   TempSrc:  Axillary Oral   SpO2: 98%  99% 98%  Weight:      Height:        General - Elderly  Caucasian female, no apparent distress HEENT - PERRLA, EOMI, atraumatic head, non tender sinuses. Lung - Clear, diffuse rales, rhonchi, no wheezes. Heart - S1, S2 heard, no murmurs, rubs, 1+  pedal edema. Abdomen - Soft, non tender, bowel sounds good Neuro - Alert, does not follow commands, unable to do full neuro exam. Skin - Warm and dry.  Data Reviewed:      Latest Ref Rng & Units 08/28/2023    4:54 AM 08/27/2023    5:30 PM 08/03/2023    2:26 AM  CBC  WBC 4.0 - 10.5 K/uL 1.6  1.8  4.6   Hemoglobin 12.0 - 15.0 g/dL 89.0  89.3  89.1   Hematocrit 36.0 - 46.0 % 34.5  34.5  33.3   Platelets 150 - 400 K/uL 87  101  151       Latest Ref Rng & Units 08/28/2023    4:54 AM 08/27/2023    5:30 PM 08/07/2023    5:31 AM  BMP  Glucose 70 - 99 mg/dL 74  72  92   BUN 8 - 23 mg/dL 21  21  30    Creatinine 0.44 - 1.00 mg/dL 8.75  8.76  8.61   Sodium 135 - 145 mmol/L 144  145  136   Potassium 3.5 - 5.1 mmol/L 3.8  4.3  4.3   Chloride 98 - 111 mmol/L 109  109  106   CO2 22 - 32 mmol/L 26  23  23    Calcium 8.9 - 10.3 mg/dL 9.3  9.5  8.8    CT Head Wo Contrast Result  Date: 08/27/2023 CLINICAL DATA:  Provided history: Altered mental status, non-traumatic. Additional history provided: High blood pressure, low heart rate, history of dementia. EXAM: CT HEAD WITHOUT CONTRAST TECHNIQUE: Contiguous axial images were obtained from the base of the skull through the vertex without intravenous contrast. RADIATION DOSE REDUCTION: This exam was performed according to the departmental dose-optimization program which includes automated exposure control, adjustment of the mA and/or kV according to patient size and/or use of iterative reconstruction technique. COMPARISON:  Brain MRI 1214.24.  Head CT 08/02/2023. FINDINGS: Brain: Generalized cerebral atrophy. Suspected small synechiae within the anterior bodies of both lateral ventricles, better appreciated on the prior brain MRI of 08/02/2023. Known small chronic infarcts  within the inferior right cerebellar hemisphere. There is no acute intracranial hemorrhage. No demarcated cortical infarct. No extra-axial fluid collection. No evidence of an intracranial mass. No midline shift. Vascular: No hyperdense vessel.  Atherosclerotic calcifications. Skull: No calvarial fracture or aggressive osseous lesion. Sinuses/Orbits: No mass or acute finding within the imaged orbits. Mild mucosal thickening within the right frontal, bilateral ethmoid and bilateral maxillary sinuses. IMPRESSION: 1.  No evidence of an acute intracranial abnormality. 2. 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 3. Generalized cerebral atrophy. 4. Mild paranasal sinus mucosal thickening. Electronically Signed   By: Rockey Childs D.O.   On: 08/27/2023 18:43   DG Chest 1 View Result Date: 08/27/2023 CLINICAL DATA:  Lethargy EXAM: PORTABLE CHEST 1 VIEW COMPARISON:  08/02/2023 FINDINGS: Cardiac shadow is stable. Tortuous thoracic aorta is noted. Left lung remains clear. Patient is significantly rotated to the right accentuating the mediastinal markings on the right. Mild right basilar atelectasis is noted. No pneumothorax is seen. No bony abnormality is noted. IMPRESSION: Mild right basilar atelectasis. Electronically Signed   By: Oneil Devonshire M.D.   On: 08/27/2023 18:25    Disposition: Status is: Observation The patient remains OBS appropriate and will d/c before 2 midnights.  Planned Discharge Destination: Skilled nursing facility     Time spent: 39 minutes  Author: Concepcion Riser, MD 08/28/2023 8:03 PM Secure chat 7am to 7pm For on call review [email or web address].

## 2023-08-28 NOTE — Progress Notes (Signed)
       [name] [name] DOB : Mar 14, 1931 ATTENDING PHYSICIAN: Darci Pore, MD    Date of Service   08/28/2023   HPI/Events of Note   Requested by nurse to talk with family who had concerns regarding patients state and care  Interventions   Assessment/Plan: Arrived to room to speak with family who already went home Given failed swallow eval and no glucose source added glucose monitoring every 6 hours      Nicole Bishop [facility] NP [facility] Cross Cover 7pm-7am - check amion for availability Pager [phone]

## 2023-08-28 NOTE — ED Notes (Signed)
 This RN assumed care of patient. Upon initial assessment at start of shift, patient unable to still follow commands and is only mumbling for communication. Per the family they attempted to feed and hydrate her orally, but patient held food in her mouth and was having difficulty swallowing today. With this information, this RN educated family on the importance of protecting patient airway and that feeding and drinking for patient is not safe at this time due to high risk for aspiration. Patient family is now mad due to her not having a meal or drink anything all day. Patient informed by this RN that the hospitalist will be contacted regarding their concerns. At this time a provider needs to speak with family to educate them on patient present condition and a plan as her treatment at this time.

## 2023-08-28 NOTE — Consult Note (Signed)
 [facility] CLINIC CARDIOLOGY CONSULT NOTE       Patient ID: Nicole Bishop [name] DOB/AGE: 1931-06-28 89 y.o.  Admit date: 08/27/2023 Referring Physician Dr. Madison Peaches Primary Physician Sadie Manna, MD  Primary Cardiologist Dr. Ammon Reason for Consultation atrial fibrillation  HPI: Nicole Bishop is a 89 y.o. female  with a past medical history of chronic atrial fibrillation, hypertension, hyperlipidemia, chronic kidney disease stage IV, vascular dementia who presented to the ED on 08/27/2023 for altered mental status.  Noted to be in atrial fibrillation with slow ventricular response on EKG.  Cardiology was consulted for further evaluation.   Majority of history is obtained via chart review due to patient's baseline mental status.  She was brought into the ED yesterday for altered mental status.  Per her son she was more confused than usual and less interactive which was concerning.  No family was present at the time of my evaluation for further questioning.  Per admission H&P she was quite weak and fatigued throughout the day yesterday with no other associated symptoms reported.  She was found to be quite hypertensive in the ED as well.  Workup in the ED notable for creatinine 1.23 which is near baseline, potassium 4.3, hemoglobin 10.6, WBC 1.8.  BNP 210.  EKG demonstrated atrial fibrillation with SVR with a rate of 46 bpm.  CT head demonstrated chronic changes but no evidence of acute abnormality.  At the time of my evaluation this morning patient is resting comfortably in hospital bed with no family present at bedside.  She does not answer most questions asked.  She did shake her head no when asked if she was in any pain.  She is hemodynamically stable and has no complaints.  Heart rate has improved to the 50-60s and BP has normalized.  Review of systems complete and found to be negative unless listed above    Past Medical History:  Diagnosis Date   Anemia    Anxiety     Arthritis    Chronic kidney disease    Coronary artery disease    Depression    Dizziness    Dyspnea    Dysrhythmia    gets tachy if anxious or excited   GERD (gastroesophageal reflux disease)    History of hiatal hernia    HOH (hard of hearing)    Hypertension    Hypothyroidism    IDA (iron  deficiency anemia) 12/14/2014   Myocardial infarction [facility]) 1992   Renal insufficiency    Sleep apnea    Tremors of nervous system     Past Surgical History:  Procedure Laterality Date   CATARACT EXTRACTION W/PHACO Left 07/15/2017   Procedure: CATARACT EXTRACTION PHACO AND INTRAOCULAR LENS PLACEMENT (IOC)-LEFT;  Surgeon: Jaye Fallow, MD;  Location: [facility] ORS;  Service: Ophthalmology;  Laterality: Left;  US  00.36.1 AP% 13.9 CDE 5.02 Fluid Pack lot # [id number]   CATARACT EXTRACTION W/PHACO Right 08/26/2017   Procedure: CATARACT EXTRACTION PHACO AND INTRAOCULAR LENS PLACEMENT (IOC);  Surgeon: Jaye Fallow, MD;  Location: [facility] ORS;  Service: Ophthalmology;  Laterality: Right;  US  00:41.0 AP% 15.3 CDE 6.27 FLUID PACK LOT # [id number]   CHOLECYSTECTOMY     COLONOSCOPY WITH PROPOFOL  N/A 04/10/2015   Procedure: COLONOSCOPY WITH PROPOFOL ;  Surgeon: Lamar ONEIDA Holmes, MD;  Location: [facility] ENDOSCOPY;  Service: Endoscopy;  Laterality: N/A;   ESOPHAGOGASTRODUODENOSCOPY (EGD) WITH PROPOFOL  N/A 04/10/2015   Procedure: ESOPHAGOGASTRODUODENOSCOPY (EGD) WITH PROPOFOL ;  Surgeon: Lamar ONEIDA Holmes, MD;  Location: [facility] ENDOSCOPY;  Service: Endoscopy;  Laterality: N/A;   fatty tumor removed on left shoulder     gallbladdedr removed     JOINT REPLACEMENT Bilateral    total knee replacements   REPLACEMENT TOTAL KNEE BILATERAL Bilateral     (Not in a hospital admission)  Social History   Socioeconomic History   Marital status: Widowed    Spouse name: Not on file   Number of children: 9   Years of education: Not on file   Highest education level: 11th grade  Occupational History    Comment: retired   Tobacco Use   Smoking status: Never   Smokeless tobacco: Never  Vaping Use   Vaping status: Never Used  Substance and Sexual Activity   Alcohol  use: No   Drug use: No   Sexual activity: Never  Other Topics Concern   Not on file  Social History Narrative   Not on file   Social Drivers of Health   Financial Resource Strain: Low Risk  (04/11/2023)   Received from [facility]   Overall Financial Resource Strain (CARDIA)    Difficulty of Paying Living Expenses: Not hard at all  Food Insecurity: Patient Unable To Answer (08/05/2023)   Hunger Vital Sign    Worried About Running Out of Food in the Last Year: Patient unable to answer    Ran Out of Food in the Last Year: Patient unable to answer  Transportation Needs: Not on file (08/05/2023)  Physical Activity: Not on file  Stress: Not on file  Social Connections: Not on file  Intimate Partner Violence: Unknown (08/05/2023)   Humiliation, Afraid, Rape, and Kick questionnaire    Fear of Current or Ex-Partner: Patient unable to answer    Emotionally Abused: Patient unable to answer    Physically Abused: Not on file    Sexually Abused: Not on file    Family History  Problem Relation Age of Onset   Arthritis Mother    Hypertension Mother    Stroke Mother    Hypertension Father    Heart attack Father    Breast cancer Neg Hx      Vitals:   08/28/23 0830 08/28/23 0900 08/28/23 0930 08/28/23 1000  BP: (!) 126/91 (!) 119/98 (!) 135/91 (!) 136/96  Pulse: 70 (!) 58 (!) 54 (!) 44  Resp: 18 19 17 18   Temp:      TempSrc:      SpO2: 97% 97% 99% 100%  Weight:      Height:        PHYSICAL EXAM General: Chronically ill-appearing elderly female, well nourished, in no acute distress. HEENT: Normocephalic and atraumatic. Neck: No JVD.  Lungs: Normal respiratory effort on room air. Clear bilaterally to auscultation. No wheezes, crackles, rhonchi.  Heart: Irregularly irregular, controlled rate. Normal S1 and S2  without gallops or murmurs.  Abdomen: Non-distended appearing.  Msk: Normal strength and tone for age. Extremities: Warm and well perfused. No clubbing, cyanosis.  No edema.  Neuro: Alert and oriented X 3. Psych: Answers questions appropriately.   Labs: Basic Metabolic Panel: Recent Labs    08/27/23 1730 08/28/23 0454  NA 145 144  K 4.3 3.8  CL 109 109  CO2 23 26  GLUCOSE 72 74  BUN 21 21  CREATININE 1.23* 1.24*  CALCIUM 9.5 9.3   Liver Function Tests: Recent Labs    08/27/23 1730  AST 22  ALT 22  ALKPHOS 104  BILITOT 0.5  PROT 6.4*  ALBUMIN 2.9*  Recent Labs    08/27/23 1730  LIPASE 21   CBC: Recent Labs    08/27/23 1730 08/28/23 0454  WBC 1.8* 1.6*  NEUTROABS  --  0.8*  HGB 10.6* 10.9*  HCT 34.5* 34.5*  MCV 91.3 90.3  PLT 101* 87*   Cardiac Enzymes: No results for input(s): CKTOTAL, CKMB, CKMBINDEX, TROPONINIHS in the last 72 hours. BNP: Recent Labs    08/27/23 1730  BNP 210.4*   D-Dimer: No results for input(s): DDIMER in the last 72 hours. Hemoglobin A1C: No results for input(s): HGBA1C in the last 72 hours. Fasting Lipid Panel: No results for input(s): CHOL, HDL, LDLCALC, TRIG, CHOLHDL, LDLDIRECT in the last 72 hours. Thyroid  Function Tests: No results for input(s): TSH, T4TOTAL, T3FREE, THYROIDAB in the last 72 hours.  Invalid input(s): FREET3 Anemia Panel: No results for input(s): VITAMINB12, FOLATE, FERRITIN, TIBC, IRON , RETICCTPCT in the last 72 hours.   Radiology: CT Head Wo Contrast Result Date: 08/27/2023 CLINICAL DATA:  Provided history: Altered mental status, non-traumatic. Additional history provided: High blood pressure, low heart rate, history of dementia. EXAM: CT HEAD WITHOUT CONTRAST TECHNIQUE: Contiguous axial images were obtained from the base of the skull through the vertex without intravenous contrast. RADIATION DOSE REDUCTION: This exam was performed according to the  departmental dose-optimization program which includes automated exposure control, adjustment of the mA and/or kV according to patient size and/or use of iterative reconstruction technique. COMPARISON:  Brain MRI 1214.24.  Head CT 08/02/2023. FINDINGS: Brain: Generalized cerebral atrophy. Suspected small synechiae within the anterior bodies of both lateral ventricles, better appreciated on the prior brain MRI of 08/02/2023. 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 There is no acute intracranial hemorrhage. No demarcated cortical infarct. No extra-axial fluid collection. No evidence of an intracranial mass. No midline shift. Vascular: No hyperdense vessel.  Atherosclerotic calcifications. Skull: No calvarial fracture or aggressive osseous lesion. Sinuses/Orbits: No mass or acute finding within the imaged orbits. Mild mucosal thickening within the right frontal, bilateral ethmoid and bilateral maxillary sinuses. IMPRESSION: 1.  No evidence of an acute intracranial abnormality. 2. 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 3. Generalized cerebral atrophy. 4. Mild paranasal sinus mucosal thickening. Electronically Signed   By: Rockey Childs D.O.   On: 08/27/2023 18:43   DG Chest 1 View Result Date: 08/27/2023 CLINICAL DATA:  Lethargy EXAM: PORTABLE CHEST 1 VIEW COMPARISON:  08/02/2023 FINDINGS: Cardiac shadow is stable. Tortuous thoracic aorta is noted. Left lung remains clear. Patient is significantly rotated to the right accentuating the mediastinal markings on the right. Mild right basilar atelectasis is noted. No pneumothorax is seen. No bony abnormality is noted. IMPRESSION: Mild right basilar atelectasis. Electronically Signed   By: Oneil Devonshire M.D.   On: 08/27/2023 18:25   ECHOCARDIOGRAM COMPLETE Result Date: 08/02/2023    ECHOCARDIOGRAM REPORT   Patient Name:   Nicole Bishop Date of Exam: 08/02/2023 Medical Rec #:  [id number]       Height:        72.0 in Accession #:    [id number]      Weight:       207.5 lb Date of Birth:  [birth year]        BSA:          2.164 m Patient Age:    89 years        BP:           109/67 mmHg Patient Gender: F  HR:           77 bpm. Exam Location:  [facility] Procedure: 2D Echo Indications:     CHF I50.31  History:         Patient has no prior history of Echocardiogram examinations.  Sonographer:     Thedora Louder RDCS, FASE Referring Phys:  [id number] CORT ONEIDA MANA Diagnosing Phys: Redell Cave MD  Sonographer Comments: Image acquisition challenging due to uncooperative patient. IMPRESSIONS  1. Left ventricular ejection fraction, by estimation, is 60 to 65%. The left ventricle has normal function. The left ventricle has no regional wall motion abnormalities. There is mild left ventricular hypertrophy. Left ventricular diastolic parameters are indeterminate.  2. Right ventricular systolic function is low normal. The right ventricular size is normal.  3. Left atrial size was mild to moderately dilated.  4. Right atrial size was moderately dilated.  5. A small pericardial effusion is present.  6. The mitral valve is normal in structure. Trivial mitral valve regurgitation.  7. Tricuspid valve regurgitation is moderate.  8. The aortic valve is tricuspid. Aortic valve regurgitation is not visualized. Aortic valve sclerosis/calcification is present, without any evidence of aortic stenosis.  9. The inferior vena cava is normal in size with greater than 50% respiratory variability, suggesting right atrial pressure of 3 mmHg. FINDINGS  Left Ventricle: Left ventricular ejection fraction, by estimation, is 60 to 65%. The left ventricle has normal function. The left ventricle has no regional wall motion abnormalities. The left ventricular internal cavity size was normal in size. There is  mild left ventricular hypertrophy. Left ventricular diastolic parameters are indeterminate. Right Ventricle: The right ventricular size is  normal. No increase in right ventricular wall thickness. Right ventricular systolic function is low normal. Left Atrium: Left atrial size was mild to moderately dilated. Right Atrium: Right atrial size was moderately dilated. Pericardium: A small pericardial effusion is present. Mitral Valve: The mitral valve is normal in structure. Trivial mitral valve regurgitation. Tricuspid Valve: The tricuspid valve is normal in structure. Tricuspid valve regurgitation is moderate. Aortic Valve: The aortic valve is tricuspid. Aortic valve regurgitation is not visualized. Aortic valve sclerosis/calcification is present, without any evidence of aortic stenosis. Aortic valve peak gradient measures 10.5 mmHg. Pulmonic Valve: The pulmonic valve was normal in structure. Pulmonic valve regurgitation is mild to moderate. Aorta: The aortic root is normal in size and structure. Venous: The inferior vena cava is normal in size with greater than 50% respiratory variability, suggesting right atrial pressure of 3 mmHg. IAS/Shunts: No atrial level shunt detected by color flow Doppler.  LEFT VENTRICLE PLAX 2D LVIDd:         3.50 cm   Diastology LVIDs:         2.20 cm   LV e' medial:   8.49 cm/s LV PW:         1.50 cm   LV E/e' medial: 8.6 LV IVS:        1.40 cm LVOT diam:     2.20 cm LV SV:         64 LV SV Index:   30 LVOT Area:     3.80 cm  RIGHT VENTRICLE RV Basal diam:  4.00 cm RV S prime:     8.49 cm/s TAPSE (M-mode): 1.3 cm LEFT ATRIUM             Index        RIGHT ATRIUM           Index LA  diam:        4.30 cm 1.99 cm/m   RA Area:     30.10 cm LA Vol (A2C):   78.1 ml 36.09 ml/m  RA Volume:   102.00 ml 47.13 ml/m LA Vol (A4C):   88.8 ml 41.03 ml/m LA Biplane Vol: 84.1 ml 38.86 ml/m  AORTIC VALVE                 PULMONIC VALVE AV Area (Vmax): 2.14 cm     PV Vmax:          1.07 m/s AV Vmax:        162.00 cm/s  PV Peak grad:     4.6 mmHg AV Peak Grad:   10.5 mmHg    PR End Diast Vel: 9.24 msec LVOT Vmax:      91.00 cm/s LVOT Vmean:      64.600 cm/s LVOT VTI:       0.169 m  AORTA                        PULMONARY ARTERY Ao Root diam: 3.60 cm        MPA diam:        3.30 cm MITRAL VALVE               TRICUSPID VALVE MV Area (PHT): 2.80 cm    TV Peak grad:   22.1 mmHg MV Decel Time: 271 msec    TV Vmax:        2.35 m/s MV E velocity: 72.80 cm/s                            SHUNTS                            Systemic VTI:  0.17 m                            Systemic Diam: 2.20 cm Redell Cave MD Electronically signed by Redell Cave MD Signature Date/Time: 08/02/2023/5:42:35 PM    Final    MR BRAIN WO CONTRAST Result Date: 08/02/2023 CLINICAL DATA:  Neuro deficit, acute, stroke suspected. EXAM: MRI HEAD WITHOUT CONTRAST TECHNIQUE: Multiplanar, multiecho pulse sequences of the brain and surrounding structures were obtained without intravenous contrast. COMPARISON:  CT head today. FINDINGS: Motion limited study.  Within this limitation: Brain: Remote right cerebellar infarct. No evidence of acute infarct, acute hemorrhage, mass lesion, midline shift or hydrocephalus. Vascular: Major arterial flow voids are maintained at the skull base. Skull and upper cervical spine: Normal marrow signal. Sinuses/Orbits: Mild paranasal sinus mucosal thickening. No acute orbital findings. Other: No mastoid effusions. IMPRESSION: 1. No evidence of acute abnormality. 2. Remote right cerebellar infarct. Electronically Signed   By: Gilmore GORMAN Molt M.D.   On: 08/02/2023 11:50   CT CHEST WO CONTRAST Result Date: 08/02/2023 CLINICAL DATA:  89 year old female status post fall. Weakness and foul smelling urine. EXAM: CT CHEST WITHOUT CONTRAST TECHNIQUE: Multidetector CT imaging of the chest was performed following the standard protocol without IV contrast. RADIATION DOSE REDUCTION: This exam was performed according to the departmental dose-optimization program which includes automated exposure control, adjustment of the mA and/or kV according to patient size  and/or use of iterative reconstruction technique. COMPARISON:  Portable chest x-ray today.  Chest CT 04/30/2018. FINDINGS: Cardiovascular: Chronic cardiomegaly,  moderate to severe enlargement of the central pulmonary arteries which may have progressed since 2019 (series 2, image 65). No pericardial effusion. Superimposed tortuous thoracic aorta with calcified atherosclerosis. Vascular patency is not evaluated in the absence of IV contrast. Mediastinum/Nodes: No mediastinal mass or lymphadenopathy. Moderate size gastric hiatal hernia is stable since 2019. Lungs/Pleura: Major airways remain patent with minimal retained secretions layering in the trachea on series 3, image 43. Asymmetrically decreased right lung volume since 2019 with confluent mostly dependent right lung opacity which is maximal in the lower lobe. Airway thickening in that region, but no large airway occlusion. No pneumothorax. Trace if any pleural fluid in the right lung. Trace layering left pleural effusion, but otherwise the left lung is well aerated. Upper Abdomen: Partially visible juxtarenal or infrarenal abdominal aortic aneurysm, estimated at 39 mm diameter on sagittal and coronal reformatted images. This was not visible in 2019. Negative visible noncontrast liver, spleen, pancreas, adrenal glands, intra-abdominal stomach. Generalized large bowel diverticulosis. Renal vascular calcifications but also superimposed nephrolithiasis including developing staghorn calculi in the right kidney. No hydronephrosis is visible. No upper abdominal free air or free fluid. Musculoskeletal: Bulky spinal disc and endplate degeneration throughout. No acute or suspicious osseous lesion identified. IMPRESSION: 1. Severe central pulmonary artery enlargement which seems progressed since 2019, consistent with Pulmonary Artery Hypertension. Underlying cardiomegaly, Aortic Atherosclerosis (ICD10-I70.0). 2. Asymmetric right lung atelectasis with dependent opacity  suspicious for acute lung infection. Associated thickening of the right lower lung bronchi. Minimal retained secretions in the trachea. Trace superimposed pleural fluid, layering more so in the left lung. 3. Abdominal Aortic Aneurysm, estimated at 39 mm diameter. Recommend follow-up ultrasound every 3 years. (Ref.: J Vasc Surg. 2018; 67:2-77 and J Am Coll Radiol 2013;10(10):789-794.) Aortic aneurysm NOS (ICD10-I71.9). 4. Chronic moderate size gastric hiatal hernia. Right renal staghorn calculi. Electronically Signed   By: VEAR Hurst M.D.   On: 08/02/2023 09:19   DG Chest Portable 1 View Result Date: 08/02/2023 CLINICAL DATA:  Frequent falls and weakness. EXAM: PORTABLE CHEST 1 VIEW COMPARISON:  Portable chest 08/23/2021, and chest CT 04/30/2018 FINDINGS: The patient is rotated to the right. The heart is moderately enlarged and the aorta is tortuous and dilated with scattered calcification, exaggerating the mediastinum. There is also a large hiatal hernia. No vascular congestion is seen. There do appear to have developed small pleural effusions and there may be a hazy infiltrate beginning to form in the left base. There is also increased elevation of the right hemidiaphragm and increased opacity in the right base which could be due to atelectasis or consolidation. Additionally, there is fullness in the region of the right hilum which could be an asymmetrically enlarged right pulmonary artery or hilar mass. Remainder of the lungs are clear. Osteopenia and thoracic spondylosis. No new osseous findings. IMPRESSION: 1. Increased elevation of the right hemidiaphragm and increased opacity in the right base which could be due to atelectasis or consolidation. 2. Fullness in the region of the right hilum which could be an asymmetrically enlarged right pulmonary artery or hilar mass. 3. Small pleural effusions and possible hazy infiltrate beginning to form in the left base. 4. Cardiomegaly and aortic atherosclerosis with  uncoiling. 5. Large hiatal hernia. 6. Consider follow-up study with chest CT, preferably with contrast if possible. Electronically Signed   By: Francis Quam M.D.   On: 08/02/2023 06:16   CT HEAD WO CONTRAST ( ) Result Date: 08/02/2023 CLINICAL DATA:  89 year old female status post fall. Weakness and foul smelling  urine. EXAM: CT HEAD WITHOUT CONTRAST TECHNIQUE: Contiguous axial images were obtained from the base of the skull through the vertex without intravenous contrast. RADIATION DOSE REDUCTION: This exam was performed according to the departmental dose-optimization program which includes automated exposure control, adjustment of the mA and/or kV according to patient size and/or use of iterative reconstruction technique. COMPARISON:  Head CT 08/23/2021. FINDINGS: Brain: Cerebral volume is stable and normal for age. No midline shift, ventriculomegaly, mass effect, evidence of mass lesion, intracranial hemorrhage or evidence of cortically based acute infarction. Gray-white differentiation is stable, normal for age aside from several small chronic right cerebellar infarcts best demonstrated on coronal images. No other convincing encephalomalacia. Vascular: Calcified atherosclerosis at the skull base. No suspicious intracranial vascular hyperdensity. Skull: Stable.  No acute osseous abnormality identified. Sinuses/Orbits: Visualized paranasal sinuses and mastoids are stable, generally well aerated although there is chronic maxillary mucoperiosteal thickening. Other: No acute orbit or scalp soft tissue finding. IMPRESSION: 1. No acute intracranial abnormality or acute traumatic injury identified. 2. Stable non contrast CT appearance of the brain since last year with small chronic right cerebellar infarcts. Electronically Signed   By: VEAR Hurst M.D.   On: 08/02/2023 05:38    ECHO 07/2023: 1. Left ventricular ejection fraction, by estimation, is 60 to 65%. The left ventricle has normal function. The left  ventricle has no regional wall motion abnormalities. There is mild left ventricular hypertrophy. Left ventricular diastolic parameters are indeterminate.   2. Right ventricular systolic function is low normal. The right ventricular size is normal.   3. Left atrial size was mild to moderately dilated.   4. Right atrial size was moderately dilated.   5. A small pericardial effusion is present.   6. The mitral valve is normal in structure. Trivial mitral valve regurgitation.   7. Tricuspid valve regurgitation is moderate.   8. The aortic valve is tricuspid. Aortic valve regurgitation is not visualized. Aortic valve sclerosis/calcification is present, without any  evidence of aortic stenosis.   9. The inferior vena cava is normal in size with greater than 50%  respiratory variability, suggesting right atrial pressure of 3 mmHg.   TELEMETRY reviewed by me 08/28/2023: Atrial fibrillation rate 50-60s  EKG reviewed by me: Atrial fibrillation SVR rate 46 bpm  Data reviewed by me 08/28/2023: last 24h vitals tele labs imaging I/O ED provider note, admission H&P  Principal Problem:   Acute encephalopathy Active Problems:   Depression   Paroxysmal atrial fibrillation (HCC)   Hypertensive urgency    ASSESSMENT AND PLAN:  Nicole Bishop is a 89 y.o. female  with a past medical history of chronic atrial fibrillation, hypertension, hyperlipidemia, chronic kidney disease stage IV, vascular dementia who presented to the ED on 08/27/2023 for altered mental status.  Noted to be in atrial fibrillation with slow ventricular response on EKG.  Cardiology was consulted for further evaluation.   # Altered mental status # Neutropenia Patient brought to ED for AMS, demented at baseline but reportedly less responsive and more confused than normal.  -Unclear etiology at this time.  -Further management per primary.  # Chronic atrial fibrillation # Hypertension Patient with hx of AF on low dose metoprolol  at baseline  presenting in AF SVR with rates in the 40s. Improved this AM overall. Also with hypertensive urgency on admission which is also improved.  -Likely plan to hold beta blocker on discharge.  -Do not suspect AF is the cause of her altered mental status.   This patient's  plan of care was discussed and created with Dr. Ammon and he is in agreement.  Signed: Danita Bloch, PA-C  08/28/2023, 10:37 AM [facility] Cardiology

## 2023-08-28 NOTE — Assessment & Plan Note (Signed)
-   This could be related to paroxysmal atrial fibrillation.  This could also be related to hypertensive urgency specially with elevated blood pressure in the ER that was up to 173/122 and that could have happened as well in her SNF. - She will be admitted to a medical telemetry observation bed. - Will manage her hypertensive urgency. - We will continue her antihypertensive therapy. - We will monitor for paroxysmal atrial fibrillation. - Cardiology consult will be obtained. - I notified Dr. Ammon about the patient.

## 2023-08-28 NOTE — Assessment & Plan Note (Signed)
-   We will continue antidepressant therapy.

## 2023-08-29 DIAGNOSIS — G9340 Encephalopathy, unspecified: Secondary | ICD-10-CM | POA: Diagnosis not present

## 2023-08-29 DIAGNOSIS — N1831 Chronic kidney disease, stage 3a: Secondary | ICD-10-CM | POA: Diagnosis not present

## 2023-08-29 DIAGNOSIS — I480 Paroxysmal atrial fibrillation: Secondary | ICD-10-CM | POA: Diagnosis not present

## 2023-08-29 DIAGNOSIS — R001 Bradycardia, unspecified: Secondary | ICD-10-CM | POA: Diagnosis not present

## 2023-08-29 DIAGNOSIS — I160 Hypertensive urgency: Secondary | ICD-10-CM | POA: Diagnosis not present

## 2023-08-29 DIAGNOSIS — D61818 Other pancytopenia: Secondary | ICD-10-CM | POA: Diagnosis not present

## 2023-08-29 LAB — CBC
HCT: 33.8 % — ABNORMAL LOW (ref 36.0–46.0)
Hemoglobin: 10.6 g/dL — ABNORMAL LOW (ref 12.0–15.0)
MCH: 27.8 pg (ref 26.0–34.0)
MCHC: 31.4 g/dL (ref 30.0–36.0)
MCV: 88.7 fL (ref 80.0–100.0)
Platelets: 81 10*3/uL — ABNORMAL LOW (ref 150–400)
RBC: 3.81 MIL/uL — ABNORMAL LOW (ref 3.87–5.11)
RDW: 16.6 % — ABNORMAL HIGH (ref 11.5–15.5)
WBC: 1.6 10*3/uL — ABNORMAL LOW (ref 4.0–10.5)
nRBC: 0 % (ref 0.0–0.2)

## 2023-08-29 LAB — BASIC METABOLIC PANEL
Anion gap: 10 (ref 5–15)
BUN: 20 mg/dL (ref 8–23)
CO2: 23 mmol/L (ref 22–32)
Calcium: 9.4 mg/dL (ref 8.9–10.3)
Chloride: 111 mmol/L (ref 98–111)
Creatinine, Ser: 1.13 mg/dL — ABNORMAL HIGH (ref 0.44–1.00)
GFR, Estimated: 46 mL/min — ABNORMAL LOW (ref >60)
Glucose, Bld: 108 mg/dL — ABNORMAL HIGH (ref 70–99)
Potassium: 3.4 mmol/L — ABNORMAL LOW (ref 3.5–5.1)
Sodium: 144 mmol/L (ref 135–145)

## 2023-08-29 LAB — GLUCOSE, CAPILLARY
Glucose-Capillary: 237 mg/dL — ABNORMAL HIGH (ref 70–99)
Glucose-Capillary: 46 mg/dL — ABNORMAL LOW (ref 70–99)
Glucose-Capillary: 63 mg/dL — ABNORMAL LOW (ref 70–99)
Glucose-Capillary: 89 mg/dL (ref 70–99)
Glucose-Capillary: 82 mg/dL (ref 70–99)
Glucose-Capillary: 66 mg/dL — ABNORMAL LOW (ref 70–99)
Glucose-Capillary: 74 mg/dL (ref 70–99)
Glucose-Capillary: 86 mg/dL (ref 70–99)
Glucose-Capillary: 92 mg/dL (ref 70–99)
Glucose-Capillary: 98 mg/dL (ref 70–99)

## 2023-08-29 LAB — FOLATE: Folate: 4.9 ng/mL — ABNORMAL LOW (ref >5.9)

## 2023-08-29 LAB — IRON AND TIBC
Iron: 53 ug/dL (ref 28–170)
Saturation Ratios: 21 % (ref 10.4–31.8)
TIBC: 258 ug/dL (ref 250–450)
UIBC: 205 ug/dL

## 2023-08-29 LAB — FERRITIN: Ferritin: 175 ng/mL (ref 11–307)

## 2023-08-29 LAB — MRSA NEXT GEN BY PCR, NASAL: MRSA by PCR Next Gen: NOT DETECTED

## 2023-08-29 MED ORDER — DEXTROSE 10 % IV SOLN
INTRAVENOUS | Status: DC
Start: 2023-08-29 — End: 2023-08-30

## 2023-08-29 MED ORDER — FOLIC ACID 1 MG PO TABS
1.0000 mg | ORAL_TABLET | Freq: Every day | ORAL | Status: DC
  Administered 2023-08-29 – 2023-08-31 (×3): 1 mg via ORAL
  Filled 2023-08-29 (×3): qty 1

## 2023-08-29 MED ORDER — DEXTROSE 50 % IV SOLN
25.0000 g | Freq: Once | INTRAVENOUS | Status: AC
  Administered 2023-08-29: 25 g via INTRAVENOUS

## 2023-08-29 MED ORDER — CALCITRIOL 1 MCG/ML PO SOLN
0.2500 ug | Freq: Every day | ORAL | Status: DC
  Administered 2023-08-29 – 2023-08-31 (×3): 0.25 ug via ORAL
  Filled 2023-08-29 (×4): qty 0.25

## 2023-08-29 MED ORDER — DEXTROSE 50 % IV SOLN
12.5000 g | Freq: Once | INTRAVENOUS | Status: AC
  Administered 2023-08-29: 12.5 g via INTRAVENOUS
  Filled 2023-08-29: qty 50

## 2023-08-29 MED ORDER — DEXTROSE 50 % IV SOLN
INTRAVENOUS | Status: AC
  Filled 2023-08-29: qty 50

## 2023-08-29 MED ORDER — DOCUSATE SODIUM 50 MG/5ML PO LIQD
100.0000 mg | Freq: Two times a day (BID) | ORAL | Status: DC
  Administered 2023-08-29 – 2023-08-31 (×5): 100 mg via ORAL
  Filled 2023-08-29 (×7): qty 10

## 2023-08-29 NOTE — Plan of Care (Signed)

## 2023-08-29 NOTE — TOC Progression Note (Signed)
 Transition of Care [facility]) - Progression Note    Patient Details  Name: Nicole Bishop [name] Date of Birth: Oct 17, 1930  [facility] CM/SW Contact  Ladene Lady, LCSW Phone Number: 08/29/2023, 12:47 PM  Clinical Narrative:   CSW spoke with son who states he will take pt home. Son asking about PCS services. CSW explained to him how to get this through [facility]. Son reports no other needs at this time.    Expected Discharge Plan: Skilled Nursing Facility Barriers to Discharge: Continued Medical Work up  Expected Discharge Plan and Services     Post Acute Care Choice:  (TBD) Living arrangements for the past 2 months: Apartment                                       Social Determinants of Health (SDOH) Interventions SDOH Screenings   Food Insecurity: No Food Insecurity (08/29/2023)  Housing: Low Risk  (08/29/2023)  Transportation Needs: No Transportation Needs (08/29/2023)  Utilities: Not At Risk (08/29/2023)  Depression (PHQ2-9): Low Risk  (08/27/2018)  Financial Resource Strain: Low Risk  (04/11/2023)   Received from [facility]  Social Connections: Patient Unable To Answer (08/29/2023)  Tobacco Use: Low Risk  (08/01/2023)    Readmission Risk Interventions     No data to display

## 2023-08-29 NOTE — ED Notes (Signed)
 This RN completed a reassessment of patient. Patient is still confused and unable to follow commands. Pt has kept pulling cardiac leads off and currently has them in a tight grip and this RN unable to release them. Patient does not follow commands in leaving the leads in place. Patient currently playing with leads and pulse ox in bed, has warm blankets, and is in no acute distress. Admission bed in the process of being cleaned and once completed, patient will go up to floor for continuity of care.

## 2023-08-29 NOTE — Progress Notes (Signed)
 [facility] CLINIC CARDIOLOGY PROGRESS NOTE       Patient ID: Nicole Bishop [name] DOB/AGE: [birth year] 89 y.o.  Admit date: 08/27/2023 Referring Physician Dr. Madison Peaches Primary Physician Sadie Manna, MD  Primary Cardiologist Dr. Ammon Reason for Consultation atrial fibrillation  HPI: Nicole Bishop is a 89 y.o. female  with a past medical history of chronic atrial fibrillation, hypertension, hyperlipidemia, chronic kidney disease stage IV, vascular dementia who presented to the ED on 08/27/2023 for altered mental status.  Noted to be in atrial fibrillation with slow ventricular response on EKG.  Cardiology was consulted for further evaluation.   Interval history: -Patient seen and examined this morning, resting comfortably in hospital bed. -She does not answer questions.  Did shake her head no when asked if she was in any pain. -Heart rate noted to be slow on telemetry.  Metoprolol  was resumed yesterday. -BP remains stable.  Review of systems complete and found to be negative unless listed above    Past Medical History:  Diagnosis Date   Anemia    Anxiety    Arthritis    Chronic kidney disease    Coronary artery disease    Depression    Dizziness    Dyspnea    Dysrhythmia    gets tachy if anxious or excited   GERD (gastroesophageal reflux disease)    History of hiatal hernia    HOH (hard of hearing)    Hypertension    Hypothyroidism    IDA (iron  deficiency anemia) 12/14/2014   Myocardial infarction [facility]) 1992   Renal insufficiency    Sleep apnea    Tremors of nervous system     Past Surgical History:  Procedure Laterality Date   CATARACT EXTRACTION W/PHACO Left 07/15/2017   Procedure: CATARACT EXTRACTION PHACO AND INTRAOCULAR LENS PLACEMENT (IOC)-LEFT;  Surgeon: Jaye Fallow, MD;  Location: [facility] ORS;  Service: Ophthalmology;  Laterality: Left;  US  00.36.1 AP% 13.9 CDE 5.02 Fluid Pack lot # [id number]   CATARACT EXTRACTION W/PHACO Right 08/26/2017    Procedure: CATARACT EXTRACTION PHACO AND INTRAOCULAR LENS PLACEMENT (IOC);  Surgeon: Jaye Fallow, MD;  Location: [facility] ORS;  Service: Ophthalmology;  Laterality: Right;  US  00:41.0 AP% 15.3 CDE 6.27 FLUID PACK LOT # [id number]   CHOLECYSTECTOMY     COLONOSCOPY WITH PROPOFOL  N/A 04/10/2015   Procedure: COLONOSCOPY WITH PROPOFOL ;  Surgeon: Lamar ONEIDA Holmes, MD;  Location: [facility] ENDOSCOPY;  Service: Endoscopy;  Laterality: N/A;   ESOPHAGOGASTRODUODENOSCOPY (EGD) WITH PROPOFOL  N/A 04/10/2015   Procedure: ESOPHAGOGASTRODUODENOSCOPY (EGD) WITH PROPOFOL ;  Surgeon: Lamar ONEIDA Holmes, MD;  Location: [facility] ENDOSCOPY;  Service: Endoscopy;  Laterality: N/A;   fatty tumor removed on left shoulder     gallbladdedr removed     JOINT REPLACEMENT Bilateral    total knee replacements   REPLACEMENT TOTAL KNEE BILATERAL Bilateral     Medications Prior to Admission  Medication Sig Dispense Refill Last Dose/Taking   acetaminophen  (TYLENOL ) 650 MG CR tablet Take 650 mg by mouth every 8 (eight) hours as needed for pain.   Taking As Needed   albuterol  (PROVENTIL ) (2.5 MG/3ML) 0.083% nebulizer solution Take 3 mLs (2.5 mg total) by nebulization every 4 (four) hours as needed for wheezing or shortness of breath. 75 mL 12 Taking As Needed   amLODipine  (NORVASC ) 10 MG tablet    08/27/2023 at  9:00 AM   ascorbic acid (VITAMIN C) 500 MG tablet Take 500 mg by mouth 2 (two) times daily.   08/27/2023 at  9:00  AM   calcitRIOL  (ROCALTROL ) 0.25 MCG capsule Take 1 capsule by mouth daily.   08/27/2023 at  9:00 AM   Cholecalciferol  (VITAMIN D3) 2000 units capsule Take 4,000 Units by mouth daily.   08/27/2023 at  9:00 AM   docusate sodium  (COLACE) 100 MG capsule Take 100 mg by mouth 2 (two) times daily.   08/27/2023 at  9:00 AM   Ferrous Sulfate  142 (45 Fe) MG TBCR Take 1 tablet by mouth every other day.   08/27/2023 at  9:00 AM   hydrALAZINE  (APRESOLINE ) 10 MG tablet Take 10 mg by mouth daily.   08/27/2023 at  9:00 AM   loratadine  (CLARITIN )  10 MG tablet Take 1 tablet (10 mg total) by mouth daily. 30 tablet 0 08/27/2023 at  9:00 AM   metoprolol  succinate (TOPROL -XL) 25 MG 24 hr tablet Take 12.5 mg by mouth daily.   08/27/2023 at  9:00 AM   olopatadine  (PATANOL) 0.1 % ophthalmic solution Place 1 drop into both eyes 2 (two) times daily as needed for allergies.    Taking As Needed   polyethylene glycol (MIRALAX  / GLYCOLAX ) packet Take 17 g by mouth daily.   08/27/2023 at  9:00 AM   rivaroxaban  (XARELTO ) 20 MG TABS tablet Take 1 tablet (20 mg total) by mouth daily with supper. 30 tablet 0 08/26/2023   traZODone  (DESYREL ) 50 MG tablet Take 50 mg by mouth at bedtime.   08/26/2023   cromolyn  (OPTICROM ) 4 % ophthalmic solution SMARTSIG:In Eye(s) (Patient not taking: Reported on 08/28/2023)   Not Taking   Social History   Socioeconomic History   Marital status: Widowed    Spouse name: Not on file   Number of children: 9   Years of education: Not on file   Highest education level: 11th grade  Occupational History    Comment: retired  Tobacco Use   Smoking status: Never   Smokeless tobacco: Never  Vaping Use   Vaping status: Never Used  Substance and Sexual Activity   Alcohol  use: No   Drug use: No   Sexual activity: Never  Other Topics Concern   Not on file  Social History Narrative   Not on file   Social Drivers of Health   Financial Resource Strain: Low Risk  (04/11/2023)   Received from [facility]   Overall Financial Resource Strain (CARDIA)    Difficulty of Paying Living Expenses: Not hard at all  Food Insecurity: No Food Insecurity (08/29/2023)   Hunger Vital Sign    Worried About Running Out of Food in the Last Year: Never true    Ran Out of Food in the Last Year: Never true  Transportation Needs: No Transportation Needs (08/29/2023)   PRAPARE - Administrator, Civil Service (Medical): No    Lack of Transportation (Non-Medical): No  Physical Activity: Not on file  Stress: Not on file  Social  Connections: Patient Unable To Answer (08/29/2023)   Social Connection and Isolation Panel [NHANES]    Frequency of Communication with Friends and Family: Patient unable to answer    Frequency of Social Gatherings with Friends and Family: Patient unable to answer    Attends Religious Services: Patient unable to answer    Active Member of Clubs or Organizations: Patient unable to answer    Attends Banker Meetings: Patient unable to answer    Marital Status: Patient unable to answer  Intimate Partner Violence: Not At Risk (08/29/2023)   Humiliation, Afraid,  Rape, and Kick questionnaire    Fear of Current or Ex-Partner: No    Emotionally Abused: No    Physically Abused: No    Sexually Abused: No    Family History  Problem Relation Age of Onset   Arthritis Mother    Hypertension Mother    Stroke Mother    Hypertension Father    Heart attack Father    Breast cancer Neg Hx      Vitals:   08/29/23 0422 08/29/23 0541 08/29/23 0635 08/29/23 0826  BP: (!) 131/113 (!) 128/100 (!) 139/97 (!) 130/103  Pulse: (!) 46 62 60 70  Resp: 16 20 18 18   Temp:      TempSrc:      SpO2: 99% 99% 99% 98%  Weight:      Height:        PHYSICAL EXAM General: Chronically ill-appearing elderly female, well nourished, in no acute distress. HEENT: Normocephalic and atraumatic. Neck: No JVD.  Lungs: Normal respiratory effort on room air. Clear bilaterally to auscultation. No wheezes, crackles, rhonchi.  Heart: Irregularly irregular, slow rate. Normal S1 and S2 without gallops or murmurs.  Abdomen: Non-distended appearing.  Msk: Normal strength and tone for age. Extremities: Warm and well perfused. No clubbing, cyanosis.  No edema.  Neuro: Alert and oriented X 3. Psych: Answers questions appropriately.   Labs: Basic Metabolic Panel: Recent Labs    08/28/23 0454 08/29/23 0521  NA 144 144  K 3.8 3.4*  CL 109 111  CO2 26 23  GLUCOSE 74 108*  BUN 21 20  CREATININE 1.24* 1.13*   CALCIUM 9.3 9.4   Liver Function Tests: Recent Labs    08/27/23 1730  AST 22  ALT 22  ALKPHOS 104  BILITOT 0.5  PROT 6.4*  ALBUMIN 2.9*   Recent Labs    08/27/23 1730  LIPASE 21   CBC: Recent Labs    08/28/23 0454 08/29/23 0521  WBC 1.6* 1.6*  NEUTROABS 0.8*  --   HGB 10.9* 10.6*  HCT 34.5* 33.8*  MCV 90.3 88.7  PLT 87* 81*   Cardiac Enzymes: No results for input(s): CKTOTAL, CKMB, CKMBINDEX, TROPONINIHS in the last 72 hours. BNP: Recent Labs    08/27/23 1730  BNP 210.4*   D-Dimer: No results for input(s): DDIMER in the last 72 hours. Hemoglobin A1C: No results for input(s): HGBA1C in the last 72 hours. Fasting Lipid Panel: No results for input(s): CHOL, HDL, LDLCALC, TRIG, CHOLHDL, LDLDIRECT in the last 72 hours. Thyroid  Function Tests: No results for input(s): TSH, T4TOTAL, T3FREE, THYROIDAB in the last 72 hours.  Invalid input(s): FREET3 Anemia Panel: Recent Labs    08/29/23 0521  FOLATE 4.9*  FERRITIN 175  TIBC 258  IRON  53     Radiology: CT Head Wo Contrast Result Date: 08/27/2023 CLINICAL DATA:  Provided history: Altered mental status, non-traumatic. Additional history provided: High blood pressure, low heart rate, history of dementia. EXAM: CT HEAD WITHOUT CONTRAST TECHNIQUE: Contiguous axial images were obtained from the base of the skull through the vertex without intravenous contrast. RADIATION DOSE REDUCTION: This exam was performed according to the departmental dose-optimization program which includes automated exposure control, adjustment of the mA and/or kV according to patient size and/or use of iterative reconstruction technique. COMPARISON:  Brain MRI 1214.24.  Head CT 08/02/2023. FINDINGS: Brain: Generalized cerebral atrophy. Suspected small synechiae within the anterior bodies of both lateral ventricles, better appreciated on the prior brain MRI of 08/02/2023. Known small chronic infarcts within the  inferior right cerebellar hemisphere. There is no acute intracranial hemorrhage. No demarcated cortical infarct. No extra-axial fluid collection. No evidence of an intracranial mass. No midline shift. Vascular: No hyperdense vessel.  Atherosclerotic calcifications. Skull: No calvarial fracture or aggressive osseous lesion. Sinuses/Orbits: No mass or acute finding within the imaged orbits. Mild mucosal thickening within the right frontal, bilateral ethmoid and bilateral maxillary sinuses. IMPRESSION: 1.  No evidence of an acute intracranial abnormality. 2. 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 3. Generalized cerebral atrophy. 4. Mild paranasal sinus mucosal thickening. Electronically Signed   By: Rockey Childs D.O.   On: 08/27/2023 18:43   DG Chest 1 View Result Date: 08/27/2023 CLINICAL DATA:  Lethargy EXAM: PORTABLE CHEST 1 VIEW COMPARISON:  08/02/2023 FINDINGS: Cardiac shadow is stable. Tortuous thoracic aorta is noted. Left lung remains clear. Patient is significantly rotated to the right accentuating the mediastinal markings on the right. Mild right basilar atelectasis is noted. No pneumothorax is seen. No bony abnormality is noted. IMPRESSION: Mild right basilar atelectasis. Electronically Signed   By: Oneil Devonshire M.D.   On: 08/27/2023 18:25   ECHOCARDIOGRAM COMPLETE Result Date: 08/02/2023    ECHOCARDIOGRAM REPORT   Patient Name:   Nicole Bishop Date of Exam: 08/02/2023 Medical Rec #:  [id number]       Height:       72.0 in Accession #:    [id number]      Weight:       207.5 lb Date of Birth:  1930/11/07        BSA:          2.164 m Patient Age:    89 years        BP:           109/67 mmHg Patient Gender: F               HR:           77 bpm. Exam Location:  [facility] Procedure: 2D Echo Indications:     CHF I50.31  History:         Patient has no prior history of Echocardiogram examinations.  Sonographer:     Thedora Louder RDCS, FASE Referring Phys:  [id number] CORT ONEIDA MANA  Diagnosing Phys: Redell Cave MD  Sonographer Comments: Image acquisition challenging due to uncooperative patient. IMPRESSIONS  1. Left ventricular ejection fraction, by estimation, is 60 to 65%. The left ventricle has normal function. The left ventricle has no regional wall motion abnormalities. There is mild left ventricular hypertrophy. Left ventricular diastolic parameters are indeterminate.  2. Right ventricular systolic function is low normal. The right ventricular size is normal.  3. Left atrial size was mild to moderately dilated.  4. Right atrial size was moderately dilated.  5. A small pericardial effusion is present.  6. The mitral valve is normal in structure. Trivial mitral valve regurgitation.  7. Tricuspid valve regurgitation is moderate.  8. The aortic valve is tricuspid. Aortic valve regurgitation is not visualized. Aortic valve sclerosis/calcification is present, without any evidence of aortic stenosis.  9. The inferior vena cava is normal in size with greater than 50% respiratory variability, suggesting right atrial pressure of 3 mmHg. FINDINGS  Left Ventricle: Left ventricular ejection fraction, by estimation, is 60 to 65%. The left ventricle has normal function. The left ventricle has no regional wall motion abnormalities. The left ventricular internal cavity size was normal in size. There is  mild left ventricular hypertrophy. Left ventricular diastolic  parameters are indeterminate. Right Ventricle: The right ventricular size is normal. No increase in right ventricular wall thickness. Right ventricular systolic function is low normal. Left Atrium: Left atrial size was mild to moderately dilated. Right Atrium: Right atrial size was moderately dilated. Pericardium: A small pericardial effusion is present. Mitral Valve: The mitral valve is normal in structure. Trivial mitral valve regurgitation. Tricuspid Valve: The tricuspid valve is normal in structure. Tricuspid valve regurgitation is  moderate. Aortic Valve: The aortic valve is tricuspid. Aortic valve regurgitation is not visualized. Aortic valve sclerosis/calcification is present, without any evidence of aortic stenosis. Aortic valve peak gradient measures 10.5 mmHg. Pulmonic Valve: The pulmonic valve was normal in structure. Pulmonic valve regurgitation is mild to moderate. Aorta: The aortic root is normal in size and structure. Venous: The inferior vena cava is normal in size with greater than 50% respiratory variability, suggesting right atrial pressure of 3 mmHg. IAS/Shunts: No atrial level shunt detected by color flow Doppler.  LEFT VENTRICLE PLAX 2D LVIDd:         3.50 cm   Diastology LVIDs:         2.20 cm   LV e' medial:   8.49 cm/s LV PW:         1.50 cm   LV E/e' medial: 8.6 LV IVS:        1.40 cm LVOT diam:     2.20 cm LV SV:         64 LV SV Index:   30 LVOT Area:     3.80 cm  RIGHT VENTRICLE RV Basal diam:  4.00 cm RV S prime:     8.49 cm/s TAPSE (M-mode): 1.3 cm LEFT ATRIUM             Index        RIGHT ATRIUM           Index LA diam:        4.30 cm 1.99 cm/m   RA Area:     30.10 cm LA Vol (A2C):   78.1 ml 36.09 ml/m  RA Volume:   102.00 ml 47.13 ml/m LA Vol (A4C):   88.8 ml 41.03 ml/m LA Biplane Vol: 84.1 ml 38.86 ml/m  AORTIC VALVE                 PULMONIC VALVE AV Area (Vmax): 2.14 cm     PV Vmax:          1.07 m/s AV Vmax:        162.00 cm/s  PV Peak grad:     4.6 mmHg AV Peak Grad:   10.5 mmHg    PR End Diast Vel: 9.24 msec LVOT Vmax:      91.00 cm/s LVOT Vmean:     64.600 cm/s LVOT VTI:       0.169 m  AORTA                        PULMONARY ARTERY Ao Root diam: 3.60 cm        MPA diam:        3.30 cm MITRAL VALVE               TRICUSPID VALVE MV Area (PHT): 2.80 cm    TV Peak grad:   22.1 mmHg MV Decel Time: 271 msec    TV Vmax:        2.35 m/s MV E velocity: 72.80 cm/s  SHUNTS                            Systemic VTI:  0.17 m                            Systemic Diam: 2.20 cm Redell Cave MD Electronically signed by Redell Cave MD Signature Date/Time: 08/02/2023/5:42:35 PM    Final    MR BRAIN WO CONTRAST Result Date: 08/02/2023 CLINICAL DATA:  Neuro deficit, acute, stroke suspected. EXAM: MRI HEAD WITHOUT CONTRAST TECHNIQUE: Multiplanar, multiecho pulse sequences of the brain and surrounding structures were obtained without intravenous contrast. COMPARISON:  CT head today. FINDINGS: Motion limited study.  Within this limitation: Brain: Remote right cerebellar infarct. No evidence of acute infarct, acute hemorrhage, mass lesion, midline shift or hydrocephalus. Vascular: Major arterial flow voids are maintained at the skull base. Skull and upper cervical spine: Normal marrow signal. Sinuses/Orbits: Mild paranasal sinus mucosal thickening. No acute orbital findings. Other: No mastoid effusions. IMPRESSION: 1. No evidence of acute abnormality. 2. Remote right cerebellar infarct. Electronically Signed   By: Gilmore GORMAN Molt M.D.   On: 08/02/2023 11:50   CT CHEST WO CONTRAST Result Date: 08/02/2023 CLINICAL DATA:  89 year old female status post fall. Weakness and foul smelling urine. EXAM: CT CHEST WITHOUT CONTRAST TECHNIQUE: Multidetector CT imaging of the chest was performed following the standard protocol without IV contrast. RADIATION DOSE REDUCTION: This exam was performed according to the departmental dose-optimization program which includes automated exposure control, adjustment of the mA and/or kV according to patient size and/or use of iterative reconstruction technique. COMPARISON:  Portable chest x-ray today.  Chest CT 04/30/2018. FINDINGS: Cardiovascular: Chronic cardiomegaly, moderate to severe enlargement of the central pulmonary arteries which may have progressed since 2019 (series 2, image 65). No pericardial effusion. Superimposed tortuous thoracic aorta with calcified atherosclerosis. Vascular patency is not evaluated in the absence of IV contrast.  Mediastinum/Nodes: No mediastinal mass or lymphadenopathy. Moderate size gastric hiatal hernia is stable since 2019. Lungs/Pleura: Major airways remain patent with minimal retained secretions layering in the trachea on series 3, image 43. Asymmetrically decreased right lung volume since 2019 with confluent mostly dependent right lung opacity which is maximal in the lower lobe. Airway thickening in that region, but no large airway occlusion. No pneumothorax. Trace if any pleural fluid in the right lung. Trace layering left pleural effusion, but otherwise the left lung is well aerated. Upper Abdomen: Partially visible juxtarenal or infrarenal abdominal aortic aneurysm, estimated at 39 mm diameter on sagittal and coronal reformatted images. This was not visible in 2019. Negative visible noncontrast liver, spleen, pancreas, adrenal glands, intra-abdominal stomach. Generalized large bowel diverticulosis. Renal vascular calcifications but also superimposed nephrolithiasis including developing staghorn calculi in the right kidney. No hydronephrosis is visible. No upper abdominal free air or free fluid. Musculoskeletal: Bulky spinal disc and endplate degeneration throughout. No acute or suspicious osseous lesion identified. IMPRESSION: 1. Severe central pulmonary artery enlargement which seems progressed since 2019, consistent with Pulmonary Artery Hypertension. Underlying cardiomegaly, Aortic Atherosclerosis (ICD10-I70.0). 2. Asymmetric right lung atelectasis with dependent opacity suspicious for acute lung infection. Associated thickening of the right lower lung bronchi. Minimal retained secretions in the trachea. Trace superimposed pleural fluid, layering more so in the left lung. 3. Abdominal Aortic Aneurysm, estimated at 39 mm diameter. Recommend follow-up ultrasound every 3 years. (Ref.: J Vasc Surg. 2018; 67:2-77 and J Am Coll Radiol  2013;10(10):789-794.) Aortic aneurysm NOS (ICD10-I71.9). 4. Chronic moderate size  gastric hiatal hernia. Right renal staghorn calculi. Electronically Signed   By: VEAR Hurst M.D.   On: 08/02/2023 09:19   DG Chest Portable 1 View Result Date: 08/02/2023 CLINICAL DATA:  Frequent falls and weakness. EXAM: PORTABLE CHEST 1 VIEW COMPARISON:  Portable chest 08/23/2021, and chest CT 04/30/2018 FINDINGS: The patient is rotated to the right. The heart is moderately enlarged and the aorta is tortuous and dilated with scattered calcification, exaggerating the mediastinum. There is also a large hiatal hernia. No vascular congestion is seen. There do appear to have developed small pleural effusions and there may be a hazy infiltrate beginning to form in the left base. There is also increased elevation of the right hemidiaphragm and increased opacity in the right base which could be due to atelectasis or consolidation. Additionally, there is fullness in the region of the right hilum which could be an asymmetrically enlarged right pulmonary artery or hilar mass. Remainder of the lungs are clear. Osteopenia and thoracic spondylosis. No new osseous findings. IMPRESSION: 1. Increased elevation of the right hemidiaphragm and increased opacity in the right base which could be due to atelectasis or consolidation. 2. Fullness in the region of the right hilum which could be an asymmetrically enlarged right pulmonary artery or hilar mass. 3. Small pleural effusions and possible hazy infiltrate beginning to form in the left base. 4. Cardiomegaly and aortic atherosclerosis with uncoiling. 5. Large hiatal hernia. 6. Consider follow-up study with chest CT, preferably with contrast if possible. Electronically Signed   By: Francis Quam M.D.   On: 08/02/2023 06:16   CT HEAD WO CONTRAST ( ) Result Date: 08/02/2023 CLINICAL DATA:  89 year old female status post fall. Weakness and foul smelling urine. EXAM: CT HEAD WITHOUT CONTRAST TECHNIQUE: Contiguous axial images were obtained from the base of the skull through the  vertex without intravenous contrast. RADIATION DOSE REDUCTION: This exam was performed according to the departmental dose-optimization program which includes automated exposure control, adjustment of the mA and/or kV according to patient size and/or use of iterative reconstruction technique. COMPARISON:  Head CT 08/23/2021. FINDINGS: Brain: Cerebral volume is stable and normal for age. No midline shift, ventriculomegaly, mass effect, evidence of mass lesion, intracranial hemorrhage or evidence of cortically based acute infarction. Gray-white differentiation is stable, normal for age aside from several small chronic right cerebellar infarcts best demonstrated on coronal images. No other convincing encephalomalacia. Vascular: Calcified atherosclerosis at the skull base. No suspicious intracranial vascular hyperdensity. Skull: Stable.  No acute osseous abnormality identified. Sinuses/Orbits: Visualized paranasal sinuses and mastoids are stable, generally well aerated although there is chronic maxillary mucoperiosteal thickening. Other: No acute orbit or scalp soft tissue finding. IMPRESSION: 1. No acute intracranial abnormality or acute traumatic injury identified. 2. Stable non contrast CT appearance of the brain since last year with small chronic right cerebellar infarcts. Electronically Signed   By: VEAR Hurst M.D.   On: 08/02/2023 05:38    ECHO 07/2023: 1. Left ventricular ejection fraction, by estimation, is 60 to 65%. The left ventricle has normal function. The left ventricle has no regional wall motion abnormalities. There is mild left ventricular hypertrophy. Left ventricular diastolic parameters are indeterminate.   2. Right ventricular systolic function is low normal. The right ventricular size is normal.   3. Left atrial size was mild to moderately dilated.   4. Right atrial size was moderately dilated.   5. A small pericardial effusion is present.   6.  The mitral valve is normal in structure. Trivial  mitral valve regurgitation.   7. Tricuspid valve regurgitation is moderate.   8. The aortic valve is tricuspid. Aortic valve regurgitation is not visualized. Aortic valve sclerosis/calcification is present, without any  evidence of aortic stenosis.   9. The inferior vena cava is normal in size with greater than 50%  respiratory variability, suggesting right atrial pressure of 3 mmHg.   TELEMETRY reviewed by me 08/29/2023: Atrial fibrillation rate 50s  EKG reviewed by me: Atrial fibrillation SVR rate 46 bpm  Data reviewed by me 08/29/2023: last 24h vitals tele labs imaging I/O hospitalist progress note  Principal Problem:   Acute encephalopathy Active Problems:   Depression   Paroxysmal atrial fibrillation (HCC)   Hypertensive urgency    ASSESSMENT AND PLAN:  KALEEN ROCHETTE is a 89 y.o. female  with a past medical history of chronic atrial fibrillation, hypertension, hyperlipidemia, chronic kidney disease stage IV, vascular dementia who presented to the ED on 08/27/2023 for altered mental status.  Noted to be in atrial fibrillation with slow ventricular response on EKG.  Cardiology was consulted for further evaluation.   # Altered mental status # Neutropenia Patient brought to ED for AMS, demented at baseline but reportedly less responsive and more confused than normal.  -Unclear etiology at this time. -Possibly just worsening of her baseline dementia -Further management per primary.  # Chronic atrial fibrillation # Hypertension Patient with hx of AF on low dose metoprolol  at baseline presenting in AF SVR with rates in the 40s. Also with hypertensive urgency on admission which is also improved.  Metoprolol  was restarted yesterday, heart rates low overnight. -Discontinue metoprolol .  Avoid AV nodal blockers on discharge. -Do not suspect AF is the cause of her altered mental status.   Cardiology will sign off. Please haiku with questions or re-engage if needed.    This patient's plan  of care was discussed and created with Dr. Ammon and he is in agreement.  Signed: Danita Bloch, PA-C  08/29/2023, 10:04 AM [facility] Cardiology

## 2023-08-29 NOTE — Progress Notes (Signed)
 Speech Language Pathology Treatment: Dysphagia  Patient Details Name: SAVINA OLSHEFSKI [name] DOB: [birth year] Today's Date: 08/29/2023 Time: 0850-0930 SLP Time Calculation (min) (ACUTE ONLY): 40 min  Assessment / Plan / Recommendation Clinical Impression  Pt seen for ongoing assessment of swallowing today- she refused po intake at initial CSE yesterday. Pt awake, verbally responded to this SLP w/ soft, mumbled speech. She initially declined wanting anything to eat/drink again today. Heavy encouragement and cues given during session to consumed few po trials. Pt has Baseline Dementia.  On RA, afebrile. WBC WNL.   Pt appears to present w/ possible pharyngeal phase dysphagia w/ overt clinical s/s of aspiration noted w/ trials of thin liquids. No overt oropharyngeal phase dysphagia noted w/ modified, dysphagia diet food/liquids; no overt neuromuscular deficits noted. Pt consumed a few po trials w/ this SLP but did NOT appear to want them. .  Pt appears at reduced risk for aspiration from an oropharyngeal phase standpoint when following general aspiration precautions w/ a Dysphagia level 1 diet w/ Nectar liquids. However, pt does have challenging factors that could impact her oropharyngeal swallowing to include Baseline Dementia/Cognitive decline, Hiatal Hernia and GERD(ANY Esophageal Dysmotility or Regurgitation of Reflux material can increase risk for aspiration of the Reflux material during Retrograde flow thus impact Pulmonary status), deconditioning/weakness, and advanced age as well as current decline in status impacting self-feeding abilities. These factors can increase risk for aspiration, dysphagia as well as decreased oral intake overall.    During po trials, pt consumed consistencies given MOD++ encouragement by SLP. Overt coughing noted w/ 2/2 trials of thin liquids via Cup. No further overt clinical s/s of aspiration noted w/ Nectar liquids and purees- no decline in vocal quality nor  change in respiratory presentation during/post trials, no cough. Oral phase appeared [facility] w/ timely bolus management and control of bolus propulsion for A-P transfer for swallowing. Oral clearing achieved w/ trial consistencies.     In setting of pt's overall presentation, recommend a Dysphagia diet(puree) w/ Nectar liquids -- monitor straw use, and pt should help to Hold Cup when drinking. Moisten/flavor foods well. Recommend general aspiration precautions - reduce distractions during meals, pt should sit up and assist in feeding self. Support/Supervision at meals. Encourage but do not force eating/drinking on pt. REFLUX precautions. Pills CRUSHED in Puree for safer, easier swallowing in setting of Cognitive decline and advanced age.  Education given on Pills in Puree; food consistencies/dysphagia diet; general aspiration and REFLUX precautions to pt and NSG. Suspect pt's comorbidities could hamper ability to upgrade oral diet at this time. MD updated; NSG updated, agreed.  Recommend Palliative Care for discussion of GOC. Dietician f/u for support.     HPI HPI: LOUKISHA GUNNERSON is a 89 y.o. African-American female with medical history significant for Dementia, Chronic Moderate size gastric Hiatal Hernia per Imaging in chart, GERD, anxiety, depression, hypertension, hypothyroidism, questionable history of atrial fibrillation, and coronary artery disease as well as OSA, who presented to the emergency room with acute onset of altered mental status with elevated blood pressure. Pt is currently on a regular solids and thin liquids diet and room air. CT Head 08/27/23: No evidence of an acute intracranial abnormality. 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 Generalized cerebral atrophy. Mild paranasal sinus mucosal thickening.  CXR 08/27/23: Mild right basilar atelectasis.  BSE completed on 08/02/23 with recommendations for mech soft solids and thin liquids -- though she needed encouragement  to eat/drink then also. Palliative Care is ordered  for GOC.      SLP Plan  Continue with current plan of care (educatoin as needed)      Recommendations for follow up therapy are one component of a multi-disciplinary discharge planning process, led by the attending physician.  Recommendations may be updated based on patient status, additional functional criteria and insurance authorization.    Recommendations  Diet recommendations: Dysphagia 1 (puree);Nectar-thick liquid Liquids provided via: Cup;Straw (monitor) Medication Administration: Crushed with puree Supervision: Staff to assist with self feeding;Full supervision/cueing for compensatory strategies Compensations: Minimize environmental distractions;Small sips/bites;Slow rate;Lingual sweep for clearance of pocketing;Follow solids with liquid Postural Changes and/or Swallow Maneuvers: Out of bed for meals;Seated upright 90 degrees;Upright 30-60 min after meal                 (Palliative Care; Dietician) Oral care BID;Oral care before and after PO;Staff/trained caregiver to provide oral care (denture care)   Frequent or constant Supervision/Assistance Dysphagia, oropharyngeal phase (R13.12) (in setting of Dementia and advanced w/ medical illness)     Continue with current plan of care (educatoin as needed)       Comer Portugal, MS, CCC-SLP Speech Language Pathologist Rehab Services; [facility] [place] [phone] (ascom) Jullian Clayson  08/29/2023, 1:05 PM

## 2023-08-29 NOTE — Plan of Care (Signed)

## 2023-08-29 NOTE — Progress Notes (Signed)
 Progress Note   Patient: Nicole Bishop FMW:[id number] DOB: 1931/05/28 DOA: 08/27/2023     0 DOS: the patient was seen and examined on 08/29/2023   Brief hospital course: Lithzy M Footman is a 89 y.o. African-American  female with medical history significant for anxiety, depression, GERD, hypertension, hypothyroidism, questionable history of atrial fibrillation, dementia, and coronary artery disease as well as OSA, who presented to the emergency room with acute onset of altered mental status with elevated blood pressure.   Admitted to hospitalist service for metabolic encephalopathy, HTN urgency. Cardiology consulted for h/o Afib, toprol  XL stopped as HR lower side. She is eating poor, seen by SLP advised pureed diet. She is at high risk of aspiration. Palliative consulted for goals of care discussion.  Assessment and Plan: * Acute encephalopathy Recent admission for aspiration pneumonia. Infectious etiology so far negative. Possible worsening dementia, delirium. Her mental status better today, trying to eat with assistance. She is at high risk for aspiration. SLP advised dysphagia 1 diet, meds crushed in puree. Assist with feeding. Discussed with son regarding palliative consult, he agreed.  Hypertensive urgency BP improved. Continue home dose Norvasc . Metoprolol  stopped due to slow HR. IV hydralazine  as needed for elevated BP  Paroxysmal atrial fibrillation (HCC) Continue Xarelto , HR lower side. Held toprol . Continue telemetry monitoring.  CKD stage 3a Kidney function stable. Avoid nephrotoxic drugs.  Pancytopenia Unknown etiology. Stable counts. Trend daily CBC. Possibly in the setting of poor nutrition. Folate low, will give supplements. Will avoid pill burden.  Encourage oral diet, supplements.    Nursing supportive care. Fall, aspiration precautions. DVT prophylaxis  Xarelto .   Code Status: Limited: Do not attempt resuscitation (DNR) -DNR-LIMITED -Do Not Intubate/DNI    Subjective: Patient is seen and examined today morning. She is weak, but more alert today, RN at bedside feeding her. HR lower side, BP stable.   Physical Exam: Vitals:   08/29/23 0541 08/29/23 0635 08/29/23 0826 08/29/23 1116  BP: (!) 128/100 (!) 139/97 (!) 130/103 116/84  Pulse: 62 60 70 61  Resp: 20 18 18 18   Temp:      TempSrc:      SpO2: 99% 99% 98% 98%  Weight:      Height:        General - Elderly Caucasian female, no apparent distress HEENT - PERRLA, EOMI, atraumatic head, non tender sinuses. Lung - Clear, diffuse rales, rhonchi, no wheezes. Heart - S1, S2 heard, no murmurs, rubs, 1+  pedal edema. Abdomen - Soft, non tender, bowel sounds good Neuro - Alert, awake, able to follow simple commands, moving extremities. Skin - Warm and dry.  Data Reviewed:      Latest Ref Rng & Units 08/29/2023    5:21 AM 08/28/2023    4:54 AM 08/27/2023    5:30 PM  CBC  WBC 4.0 - 10.5 K/uL 1.6  1.6  1.8   Hemoglobin 12.0 - 15.0 g/dL 89.3  89.0  89.3   Hematocrit 36.0 - 46.0 % 33.8  34.5  34.5   Platelets 150 - 400 K/uL 81  87  101       Latest Ref Rng & Units 08/29/2023    5:21 AM 08/28/2023    4:54 AM 08/27/2023    5:30 PM  BMP  Glucose 70 - 99 mg/dL 891  74  72   BUN 8 - 23 mg/dL 20  21  21    Creatinine 0.44 - 1.00 mg/dL 8.86  8.75  8.76   Sodium 135 -  145 mmol/L 144  144  145   Potassium 3.5 - 5.1 mmol/L 3.4  3.8  4.3   Chloride 98 - 111 mmol/L 111  109  109   CO2 22 - 32 mmol/L 23  26  23    Calcium 8.9 - 10.3 mg/dL 9.4  9.3  9.5    CT Head Wo Contrast Result Date: 08/27/2023 CLINICAL DATA:  Provided history: Altered mental status, non-traumatic. Additional history provided: High blood pressure, low heart rate, history of dementia. EXAM: CT HEAD WITHOUT CONTRAST TECHNIQUE: Contiguous axial images were obtained from the base of the skull through the vertex without intravenous contrast. RADIATION DOSE REDUCTION: This exam was performed according to the departmental dose-optimization  program which includes automated exposure control, adjustment of the mA and/or kV according to patient size and/or use of iterative reconstruction technique. COMPARISON:  Brain MRI 1214.24.  Head CT 08/02/2023. FINDINGS: Brain: Generalized cerebral atrophy. Suspected small synechiae within the anterior bodies of both lateral ventricles, better appreciated on the prior brain MRI of 08/02/2023. 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 There is no acute intracranial hemorrhage. No demarcated cortical infarct. No extra-axial fluid collection. No evidence of an intracranial mass. No midline shift. Vascular: No hyperdense vessel.  Atherosclerotic calcifications. Skull: No calvarial fracture or aggressive osseous lesion. Sinuses/Orbits: No mass or acute finding within the imaged orbits. Mild mucosal thickening within the right frontal, bilateral ethmoid and bilateral maxillary sinuses. IMPRESSION: 1.  No evidence of an acute intracranial abnormality. 2. 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 3. Generalized cerebral atrophy. 4. Mild paranasal sinus mucosal thickening. Electronically Signed   By: Rockey Childs D.O.   On: 08/27/2023 18:43   DG Chest 1 View Result Date: 08/27/2023 CLINICAL DATA:  Lethargy EXAM: PORTABLE CHEST 1 VIEW COMPARISON:  08/02/2023 FINDINGS: Cardiac shadow is stable. Tortuous thoracic aorta is noted. Left lung remains clear. Patient is significantly rotated to the right accentuating the mediastinal markings on the right. Mild right basilar atelectasis is noted. No pneumothorax is seen. No bony abnormality is noted. IMPRESSION: Mild right basilar atelectasis. Electronically Signed   By: Oneil Devonshire M.D.   On: 08/27/2023 18:25   Family discussion: I discussed with son over phone, he agreed with palliative care consult. All questions answered.  Disposition: Status is: Observation The patient will require care spanning > 2 midnights  and should be moved to inpatient because: poor oral intake, altered mental status, palliative consult.  Planned Discharge Destination: Skilled nursing facility     Time spent: 38 minutes  Author: Concepcion Riser, MD 08/29/2023 1:08 PM Secure chat 7am to 7pm For on call review [email or web address].

## 2023-08-29 NOTE — Progress Notes (Signed)
       [name] [name] DOB : 08/24/1930 ATTENDING PHYSICIAN: Nicole Pore, MD    Date of Service   08/29/2023   HPI/Events of Note   Patient arrived 27 to room on 1C with glucose 46, no evidence of prior read   Interventions   Assessment/Plan: 1 amp D50 given repeat glucose 0400 down 63, 1/2 amp D 50 ordered and D10 continuous infusion at 30 ordered        Nicole Bishop [facility] NP [facility] Cross Cover 7pm-7am - check amion for availability Pager [phone]

## 2023-08-30 DIAGNOSIS — D61818 Other pancytopenia: Secondary | ICD-10-CM | POA: Diagnosis not present

## 2023-08-30 DIAGNOSIS — G9341 Metabolic encephalopathy: Secondary | ICD-10-CM | POA: Diagnosis not present

## 2023-08-30 DIAGNOSIS — F32A Depression, unspecified: Secondary | ICD-10-CM | POA: Diagnosis present

## 2023-08-30 DIAGNOSIS — K219 Gastro-esophageal reflux disease without esophagitis: Secondary | ICD-10-CM | POA: Diagnosis present

## 2023-08-30 DIAGNOSIS — N1831 Chronic kidney disease, stage 3a: Secondary | ICD-10-CM | POA: Diagnosis not present

## 2023-08-30 DIAGNOSIS — R4189 Other symptoms and signs involving cognitive functions and awareness: Secondary | ICD-10-CM | POA: Diagnosis present

## 2023-08-30 DIAGNOSIS — N184 Chronic kidney disease, stage 4 (severe): Secondary | ICD-10-CM | POA: Diagnosis present

## 2023-08-30 DIAGNOSIS — I4891 Unspecified atrial fibrillation: Secondary | ICD-10-CM

## 2023-08-30 DIAGNOSIS — R4182 Altered mental status, unspecified: Secondary | ICD-10-CM | POA: Diagnosis present

## 2023-08-30 DIAGNOSIS — Z96653 Presence of artificial knee joint, bilateral: Secondary | ICD-10-CM | POA: Diagnosis present

## 2023-08-30 DIAGNOSIS — F0153 Vascular dementia, unspecified severity, with mood disturbance: Secondary | ICD-10-CM | POA: Diagnosis present

## 2023-08-30 DIAGNOSIS — I2510 Atherosclerotic heart disease of native coronary artery without angina pectoris: Secondary | ICD-10-CM | POA: Diagnosis present

## 2023-08-30 DIAGNOSIS — I160 Hypertensive urgency: Secondary | ICD-10-CM | POA: Diagnosis not present

## 2023-08-30 DIAGNOSIS — I480 Paroxysmal atrial fibrillation: Secondary | ICD-10-CM | POA: Diagnosis not present

## 2023-08-30 DIAGNOSIS — E039 Hypothyroidism, unspecified: Secondary | ICD-10-CM | POA: Diagnosis present

## 2023-08-30 DIAGNOSIS — D72819 Decreased white blood cell count, unspecified: Secondary | ICD-10-CM | POA: Diagnosis not present

## 2023-08-30 DIAGNOSIS — R1312 Dysphagia, oropharyngeal phase: Secondary | ICD-10-CM | POA: Diagnosis present

## 2023-08-30 DIAGNOSIS — I252 Old myocardial infarction: Secondary | ICD-10-CM | POA: Diagnosis not present

## 2023-08-30 DIAGNOSIS — G4733 Obstructive sleep apnea (adult) (pediatric): Secondary | ICD-10-CM | POA: Diagnosis present

## 2023-08-30 DIAGNOSIS — D509 Iron deficiency anemia, unspecified: Secondary | ICD-10-CM | POA: Diagnosis present

## 2023-08-30 DIAGNOSIS — G9340 Encephalopathy, unspecified: Secondary | ICD-10-CM | POA: Diagnosis not present

## 2023-08-30 DIAGNOSIS — E785 Hyperlipidemia, unspecified: Secondary | ICD-10-CM | POA: Diagnosis present

## 2023-08-30 DIAGNOSIS — R001 Bradycardia, unspecified: Secondary | ICD-10-CM | POA: Diagnosis present

## 2023-08-30 DIAGNOSIS — Z66 Do not resuscitate: Secondary | ICD-10-CM | POA: Diagnosis present

## 2023-08-30 DIAGNOSIS — Z515 Encounter for palliative care: Secondary | ICD-10-CM | POA: Diagnosis not present

## 2023-08-30 DIAGNOSIS — I10 Essential (primary) hypertension: Secondary | ICD-10-CM | POA: Diagnosis present

## 2023-08-30 DIAGNOSIS — I5032 Chronic diastolic (congestive) heart failure: Secondary | ICD-10-CM | POA: Diagnosis present

## 2023-08-30 DIAGNOSIS — F419 Anxiety disorder, unspecified: Secondary | ICD-10-CM | POA: Diagnosis present

## 2023-08-30 DIAGNOSIS — I130 Hypertensive heart and chronic kidney disease with heart failure and stage 1 through stage 4 chronic kidney disease, or unspecified chronic kidney disease: Secondary | ICD-10-CM | POA: Diagnosis present

## 2023-08-30 LAB — GLUCOSE, CAPILLARY
Glucose-Capillary: 74 mg/dL (ref 70–99)
Glucose-Capillary: 66 mg/dL — ABNORMAL LOW (ref 70–99)
Glucose-Capillary: 78 mg/dL (ref 70–99)
Glucose-Capillary: 105 mg/dL — ABNORMAL HIGH (ref 70–99)

## 2023-08-30 LAB — VITAMIN B12: Vitamin B-12: 2909 pg/mL — ABNORMAL HIGH (ref 180–914)

## 2023-08-30 NOTE — Consult Note (Signed)
 Consultation Note Date: 08/30/2023   Patient Name: Nicole Bishop  DOB: [birth year]  [name]  Age / Sex: 89 y.o., female  PCP: Sadie Manna, MD Referring Physician: Darci Pore, MD  Reason for Consultation: Establishing goals of care   HPI/Brief Hospital Course: 89 y.o. female  with past medical history of PAF and DVT on Xarelto , dementia, CKD stage 3a, CAD, OSA, HTN, hypothyroidism, anxiety and depression admitted from [facility] on 08/27/2023 with AMS and elevated BP.  Admitted and being treated for metabolic encephalopathy and HTN urgency  Evaluated by ST who recommended pureed diet-remains high risk for aspiration  Noted recent admit 12/14-12/19 for aspiration PNA-discharged at that time to [facility] for STR  Palliative medicine was consulted for assisting with goals of care conversations.  Subjective:  Extensive chart review has been completed prior to meeting patient including labs, vital signs, imaging, progress notes, orders, and available advanced directive documents from current and previous encounters.  Visited with Nicole Bishop at her bedside. She is resting in bed with eyes close and does not acknowledge my presence in room. Due to underlying cognitive impairment she is unable to participate in goals of care conversations. No family at bedside during time of visit.  Called and spoke with [name].  Introduced myself as a publishing rights manager as a member of the palliative care team. Explained palliative medicine is specialized medical care for people living with serious illness. It focuses on providing relief from the symptoms and stress of a serious illness. The goal is to improve quality of life for both the patient and the family.   Nicole Bishop plans to attempt visiting at bedside later today and will let nursing staff know when he arrives for us  to visit at bedside.  Returned to bedside later in day. Nicole Bishop  accidentally removed IV and cardiac monitor-notified nursing staff. No family at bedside and according to nursing staff, no family present today.  PMT will continue to follow and support patient as needed. Full consult note to follow once able to meet with family.  Objective: Primary Diagnoses: Present on Admission:  Acute encephalopathy  Paroxysmal atrial fibrillation (HCC)  Depression  Pancytopenia (HCC)   Physical Exam Constitutional:      General: She is not in acute distress.    Appearance: She is ill-appearing.  Pulmonary:     Effort: Pulmonary effort is normal. No respiratory distress.  Neurological:     Mental Status: She is alert. She is disoriented.     Motor: Weakness present.     Vital Signs: BP (!) 134/95 (BP Location: Right Arm)   Pulse (!) 58   Temp 97.9 F (36.6 C)   Resp 17   Ht 6' 2 (1.88 m)   Wt 96.2 kg   SpO2 100%   BMI 27.23 kg/m  Pain Scale: 0-10   Pain Score: 0-No pain  IO: Intake/output summary:  Intake/Output Summary (Last 24 hours) at 08/30/2023 1256 Last data filed at 08/29/2023 1653 Gross per 24 hour  Intake 240 ml  Output 200 ml  Net 40 ml    LBM: Last BM Date :  (not able to get date of LBM) Baseline Weight: Weight: 94.1 kg Most recent weight: Weight: 96.2 kg       Assessment and Plan  SUMMARY OF RECOMMENDATIONS   GOC needed with family-hope to visit at bedside tomorrow   Palliative Prophylaxis:   Bowel Regimen, Delirium Protocol and Frequent Pain Assessment   Thank you for this consult  and allowing Palliative Medicine to participate in the care of Nicole Bishop. Palliative medicine will continue to follow and assist as needed.   Time Total: 40 minutes  Time spent includes: Detailed review of medical records (labs, imaging, vital signs), medically appropriate exam (mental status, respiratory, cardiac, skin), discussed with treatment team, counseling and educating patient, family and staff, documenting clinical  information, medication management and coordination of care.   Signed by: Waddell Lesches, DNP, AGNP-C Palliative Medicine    Please contact Palliative Medicine Team phone at [phone] for questions and concerns.  For individual provider: See Tracey

## 2023-08-30 NOTE — Plan of Care (Signed)
  Problem: Elimination: Goal: Will not experience complications related to bowel motility Outcome: Progressing   Problem: Pain Management: Goal: General experience of comfort will improve Outcome: Progressing   Problem: Skin Integrity: Goal: Risk for impaired skin integrity will decrease Outcome: Progressing   Problem: Clinical Measurements: Goal: Cardiovascular complication will be avoided Outcome: Progressing   Problem: Clinical Measurements: Goal: Will remain free from infection Outcome: Progressing

## 2023-08-30 NOTE — Plan of Care (Addendum)
 Patient is alert and oriented X 1. Got a call from CCMD, her heart rate showed 25 in monitor, manually her heart rate was 57. No any other symptoms noted. Pt removed her I/v. Md made aware. Md is ok to leave I/v out.  She has been drinking and eating better than yesterday.Plan of care ongoing. Denies any pain.     Problem: Education: Goal: Knowledge of General Education information will improve Description: Including pain rating scale, medication(s)/side effects and non-pharmacologic comfort measures Outcome: Progressing   Problem: Health Behavior/Discharge Planning: Goal: Ability to manage health-related needs will improve Outcome: Progressing   Problem: Clinical Measurements: Goal: Ability to maintain clinical measurements within normal limits will improve Outcome: Progressing Goal: Will remain free from infection Outcome: Progressing Goal: Diagnostic test results will improve Outcome: Progressing Goal: Respiratory complications will improve Outcome: Progressing Goal: Cardiovascular complication will be avoided Outcome: Progressing   Problem: Activity: Goal: Risk for activity intolerance will decrease Outcome: Progressing   Problem: Nutrition: Goal: Adequate nutrition will be maintained Outcome: Progressing   Problem: Coping: Goal: Level of anxiety will decrease Outcome: Progressing   Problem: Elimination: Goal: Will not experience complications related to bowel motility Outcome: Progressing Goal: Will not experience complications related to urinary retention Outcome: Progressing   Problem: Pain Management: Goal: General experience of comfort will improve Outcome: Progressing   Problem: Safety: Goal: Ability to remain free from injury will improve Outcome: Progressing   Problem: Skin Integrity: Goal: Risk for impaired skin integrity will decrease Outcome: Progressing

## 2023-08-30 NOTE — Progress Notes (Signed)
 Progress Note   Patient: Nicole Bishop FMW:[id number] DOB: 06/13/1931 DOA: 08/27/2023     0 DOS: the patient was seen and examined on 08/30/2023   Brief hospital course: Nicole Bishop is a 89 y.o. African-American  female with medical history significant for anxiety, depression, GERD, hypertension, hypothyroidism, questionable history of atrial fibrillation, dementia, and coronary artery disease as well as OSA, who presented to the emergency room with acute onset of altered mental status with elevated blood pressure.   Admitted to hospitalist service for metabolic encephalopathy, HTN urgency. Cardiology consulted for h/o Afib, toprol  XL stopped as HR lower side. She is eating poor, seen by SLP advised pureed diet. She is at high risk of aspiration. Palliative consulted for goals of care discussion.  Assessment and Plan: * Acute encephalopathy Recent admission for aspiration pneumonia. Infectious etiology so far negative. Possible worsening dementia, delirium. Her mental status improved. Eats better with assistance. SLP advised dysphagia 1 diet, meds crushed in puree. Assist with feeding.  Hypertensive urgency BP improved. Continue home dose Norvasc . Metoprolol  stopped due to slow HR. IV hydralazine  as needed for elevated BP  Paroxysmal atrial fibrillation (HCC) Continue Xarelto , Held toprol  due to bradycardia. Continue telemetry monitoring.  CKD stage 3a Kidney function stable. Avoid nephrotoxic drugs.  Pancytopenia Unknown etiology. Stable counts. Repeat CBC tomorrow. Possibly in the setting of poor nutrition. Folate low, will give supplements. Continue folic acid  supplements. Encourage oral diet, supplements.    Nursing supportive care. Fall, aspiration precautions. DVT prophylaxis  Xarelto .   Code Status: Limited: Do not attempt resuscitation (DNR) -DNR-LIMITED -Do Not Intubate/DNI   Subjective: Patient is seen and examined today morning. She is weak, staring at her  breakfast tray. No family at bedside. HR 50-60, BP stable.   Physical Exam: Vitals:   08/29/23 2035 08/30/23 0059 08/30/23 0801 08/30/23 1108  BP: (!) 101/53 (!) 115/93 (!) 134/95   Pulse: (!) 57  (!) 57 (!) 58  Resp: 16 20 17    Temp:      TempSrc:      SpO2: 100% 100% 100%   Weight:      Height:        General - Elderly Caucasian female, no apparent distress HEENT - PERRLA, EOMI, atraumatic head, non tender sinuses. Lung - Clear, diffuse rales, rhonchi, no wheezes. Heart - S1, S2 heard, no murmurs, rubs, 1+  pedal edema. Abdomen - Soft, non tender, bowel sounds good Neuro - Alert, awake, able to follow simple commands, moving extremities. Skin - Warm and dry.  Data Reviewed:      Latest Ref Rng & Units 08/29/2023    5:21 AM 08/28/2023    4:54 AM 08/27/2023    5:30 PM  CBC  WBC 4.0 - 10.5 K/uL 1.6  1.6  1.8   Hemoglobin 12.0 - 15.0 g/dL 89.3  89.0  89.3   Hematocrit 36.0 - 46.0 % 33.8  34.5  34.5   Platelets 150 - 400 K/uL 81  87  101       Latest Ref Rng & Units 08/29/2023    5:21 AM 08/28/2023    4:54 AM 08/27/2023    5:30 PM  BMP  Glucose 70 - 99 mg/dL 891  74  72   BUN 8 - 23 mg/dL 20  21  21    Creatinine 0.44 - 1.00 mg/dL 8.86  8.75  8.76   Sodium 135 - 145 mmol/L 144  144  145   Potassium 3.5 - 5.1 mmol/L 3.4  3.8  4.3   Chloride 98 - 111 mmol/L 111  109  109   CO2 22 - 32 mmol/L 23  26  23    Calcium 8.9 - 10.3 mg/dL 9.4  9.3  9.5    No results found.  Family discussion: I discussed with son over phone, he agreed with palliative care consult. All questions answered.  Disposition: Status is: Observation   Planned Discharge Destination: Skilled nursing facility     Time spent: 38 minutes  Author: Concepcion Riser, MD 08/30/2023 2:06 PM Secure chat 7am to 7pm For on call review [email or web address]

## 2023-08-31 DIAGNOSIS — Z515 Encounter for palliative care: Secondary | ICD-10-CM | POA: Diagnosis not present

## 2023-08-31 DIAGNOSIS — I160 Hypertensive urgency: Secondary | ICD-10-CM | POA: Diagnosis not present

## 2023-08-31 DIAGNOSIS — D72819 Decreased white blood cell count, unspecified: Secondary | ICD-10-CM

## 2023-08-31 DIAGNOSIS — R001 Bradycardia, unspecified: Secondary | ICD-10-CM | POA: Diagnosis not present

## 2023-08-31 DIAGNOSIS — I4891 Unspecified atrial fibrillation: Secondary | ICD-10-CM | POA: Diagnosis not present

## 2023-08-31 DIAGNOSIS — I480 Paroxysmal atrial fibrillation: Secondary | ICD-10-CM | POA: Diagnosis not present

## 2023-08-31 DIAGNOSIS — G9340 Encephalopathy, unspecified: Secondary | ICD-10-CM | POA: Diagnosis not present

## 2023-08-31 LAB — GLUCOSE, CAPILLARY
Glucose-Capillary: 61 mg/dL — ABNORMAL LOW (ref 70–99)
Glucose-Capillary: 62 mg/dL — ABNORMAL LOW (ref 70–99)
Glucose-Capillary: 74 mg/dL (ref 70–99)
Glucose-Capillary: 79 mg/dL (ref 70–99)
Glucose-Capillary: 85 mg/dL (ref 70–99)
Glucose-Capillary: 97 mg/dL (ref 70–99)

## 2023-08-31 LAB — BASIC METABOLIC PANEL
Anion gap: 7 (ref 5–15)
BUN: 23 mg/dL (ref 8–23)
CO2: 23 mmol/L (ref 22–32)
Calcium: 9.6 mg/dL (ref 8.9–10.3)
Chloride: 109 mmol/L (ref 98–111)
Creatinine, Ser: 1.19 mg/dL — ABNORMAL HIGH (ref 0.44–1.00)
GFR, Estimated: 43 mL/min — ABNORMAL LOW (ref >60)
Glucose, Bld: 66 mg/dL — ABNORMAL LOW (ref 70–99)
Potassium: 3.4 mmol/L — ABNORMAL LOW (ref 3.5–5.1)
Sodium: 139 mmol/L (ref 135–145)

## 2023-08-31 NOTE — Progress Notes (Signed)
 Daily Progress Note   Patient Name: Nicole Bishop       Date: 08/31/2023 DOB: [birth year]  Age: 89 y.o. MRN#: [id number] Attending Physician: Nicole Pore, MD Primary Bishop Physician: Nicole Manna, MD Admit Date: 08/27/2023  Reason for Consultation/Follow-up: Establishing goals of Bishop  HPI/Brief Hospital Review: 89 y.o. female  with past medical history of PAF and DVT on Xarelto , dementia, CKD stage 3a, CAD, OSA, HTN, hypothyroidism, anxiety and depression admitted from [facility] on 08/27/2023 with AMS and elevated BP.   Admitted and being treated for metabolic encephalopathy and HTN urgency   Evaluated by ST who recommended pureed diet-remains high risk for aspiration   Noted recent admit 12/14-12/19 for aspiration PNA-discharged at that time to [facility] for STR   Palliative medicine was consulted for assisting with goals of Bishop conversations.  Subjective: Extensive chart review has been completed prior to meeting patient including labs, vital signs, imaging, progress notes, orders, and available advanced directive documents from current and previous encounters.    Visited with Nicole Bishop at her bedside. She is awake, alert, attempts to communicate, unable to engage in goals of Bishop conversations. [name] at bedside.  Nicole Bishop shares prior to initial admission in December, Nicole Bishop was able to ambulate and Bishop for herself. Since initial admission he speaks to a noted functional and cognitive decline. She was discharged to [facility] for STR last admission but Nicole Bishop does not feel she is benefiting from STR any longer.  We discussed chronic disease trajectory as it relates to demential and expectations at EOL. Nicole Bishop shares he is aware that dementia is  progressive and is not anticipating his mother to recover back to her baseline and anticipates an ongoing decline.  Nicole Bishop shares he has a total of 8 siblings. He and [name] are most involved in Nicole Bishop as they live local. One daughter recently moved in with Nicole Bishop from out of state but requires assistance herself due to poor health. Both Nicole Bishop and Nicole Bishop live a few minutes away from Nicole Bishop, most other siblings live out of state.  Nicole Bishop shares the family is most interested in taking Nicole Bishop home and focusing on comfort. We discuss the overall philosophy of hospice and services provided. Nicole Bishop shares he is interested in pursuing hospice Bishop and has already  been in communication with Nicole Bishop. He agrees to meeting with Nicole Bishop hospice liaison.  Returned to bedside with Nicole Bishop, ACC HL. Nicole Bishop remains at bedside, attempts were made from him to connect with [name] to have on speaker phone for meeting, call was unanswered. Nicole Bishop was able to connect with one of his brothers-Nicole Bishop who confirms family aware of decisions being made and allowing Nicole Bishop to serve as spokesperson.  Another attempt to call sister-Nicole Bishop, HIPAA compliant voicemail left.  Nursing staff, primary team, TOC and ACC HL engaged.  Thank you for allowing the Palliative Medicine Team to assist in the Bishop of this patient.  Total time:  50 minutes  Time spent includes: Detailed review of medical records (labs, imaging, vital signs), medically appropriate exam (mental status, respiratory, cardiac, skin), discussed with treatment team, counseling and educating patient, family and staff, documenting clinical information, medication management and coordination of Bishop.  Nicole Lesches, DNP, AGNP-C Palliative Medicine   Please contact Palliative Medicine Team phone at [phone] for questions and concerns.

## 2023-08-31 NOTE — Progress Notes (Signed)
 Patient blood sugar was 61, no any other symptoms noted, helped her to feed one ice cream. Rechecked blood sugar was 85.

## 2023-08-31 NOTE — TOC Progression Note (Signed)
 Transition of Care [facility]) - Progression Note    Patient Details  Name: Nicole Bishop [name] Date of Birth: Jan 28, 1931  Transition of Care [facility]) CM/SW Contact  Odella Ku, RN Phone Number: 08/31/2023, 11:25 AM  Clinical Narrative:     Per MD, family would like to take patient home with hospice through [facility],  Kara M. notified.    Expected Discharge Plan: Skilled Nursing Facility Barriers to Discharge: Continued Medical Work up  Expected Discharge Plan and Services     Post Acute Care Choice:  (TBD) Living arrangements for the past 2 months: Apartment                                       Social Determinants of Health (SDOH) Interventions SDOH Screenings   Food Insecurity: No Food Insecurity (08/29/2023)  Housing: Low Risk  (08/29/2023)  Transportation Needs: No Transportation Needs (08/29/2023)  Utilities: Not At Risk (08/29/2023)  Depression (PHQ2-9): Low Risk  (08/27/2018)  Financial Resource Strain: Low Risk  (04/11/2023)   Received from [facility]  Social Connections: Patient Unable To Answer (08/29/2023)  Tobacco Use: Low Risk  (08/01/2023)    Readmission Risk Interventions     No data to display

## 2023-08-31 NOTE — Plan of Care (Signed)

## 2023-08-31 NOTE — Progress Notes (Signed)
 Progress Note   Patient: Nicole Bishop FMW:[id number] DOB: 1931-02-10 DOA: 08/27/2023     1 DOS: the patient was seen and examined on 08/31/2023   Brief hospital course: Emilya M Lasser is a 89 y.o. African-American  female with medical history significant for anxiety, depression, GERD, hypertension, hypothyroidism, questionable history of atrial fibrillation, dementia, and coronary artery disease as well as OSA, who presented to the emergency room with acute onset of altered mental status with elevated blood pressure.   Admitted to hospitalist service for metabolic encephalopathy, HTN urgency. Cardiology consulted for h/o Afib, toprol  XL stopped as HR lower side. She is eating poor, seen by SLP advised pureed diet. She is at high risk of aspiration. Palliative consulted for goals of care discussion.  Assessment and Plan: * Acute encephalopathy Recent admission for aspiration pneumonia. Infectious etiology negative so far. Possible worsening dementia, delirium. Her mental status improved. Eats better with assistance. SLP advised dysphagia 1 diet, meds crushed in puree. Assist with feeding.  Hypertensive urgency BP improved. Continue home dose Norvasc . Metoprolol  stopped due to slow HR. IV hydralazine  as needed for elevated BP.  Bradycardia with pauses: Hold beta blocker therapy. Noted 3.4 second pause on telemetry. HR 50s, she remains asymptomatic.  Paroxysmal atrial fibrillation (HCC) Continue Xarelto , Held toprol  due to bradycardia. Continue telemetry monitoring.  CKD stage 3a Kidney function stable. Avoid nephrotoxic drugs.  Pancytopenia Unknown etiology. Stable counts. Possibly in the setting of poor nutrition. Folate low, will give supplements. Continue folic acid  supplements. Encourage oral diet, supplements.    Nursing supportive care. Fall, aspiration precautions. DVT prophylaxis  Xarelto .   Code Status: Limited: Do not attempt resuscitation (DNR) -DNR-LIMITED  -Do Not Intubate/DNI  Palliative on board - discussed goals of care, family wished for hospice upon discharge.  Subjective: Patient is seen and examined today morning. She is weak, blood sugars lower side. Eating poor. HR 50, BP stable. Tele showed a 3-4 sec pause. Family wishes to taker her home with hospice.  Physical Exam: Vitals:   08/31/23 0034 08/31/23 0505 08/31/23 0740 08/31/23 1123  BP: (!) 118/99 (!) 130/91 (!) 130/99 (!) 151/75  Pulse: 84 (!) 51 (!) 54 (!) 51  Resp: 16 18 18 20   Temp: (!) 97.3 F (36.3 C) 98 F (36.7 C)    TempSrc:      SpO2: 100% 97% 100% 100%  Weight:      Height:        General - Elderly Caucasian female, no apparent distress HEENT - PERRLA, EOMI, atraumatic head, non tender sinuses. Lung - Clear, diffuse rales, rhonchi, no wheezes. Heart - S1, S2 heard, no murmurs, rubs, 1+  pedal edema. Abdomen - Soft, non tender, bowel sounds good Neuro - Alert, awake, confused, moving extremities. Skin - Warm and dry.  Data Reviewed:      Latest Ref Rng & Units 08/29/2023    5:21 AM 08/28/2023    4:54 AM 08/27/2023    5:30 PM  CBC  WBC 4.0 - 10.5 K/uL 1.6  1.6  1.8   Hemoglobin 12.0 - 15.0 g/dL 89.3  89.0  89.3   Hematocrit 36.0 - 46.0 % 33.8  34.5  34.5   Platelets 150 - 400 K/uL 81  87  101       Latest Ref Rng & Units 08/31/2023    5:03 AM 08/29/2023    5:21 AM 08/28/2023    4:54 AM  BMP  Glucose 70 - 99 mg/dL 66  891  74  BUN 8 - 23 mg/dL 23  20  21    Creatinine 0.44 - 1.00 mg/dL 8.80  8.86  8.75   Sodium 135 - 145 mmol/L 139  144  144   Potassium 3.5 - 5.1 mmol/L 3.4  3.4  3.8   Chloride 98 - 111 mmol/L 109  111  109   CO2 22 - 32 mmol/L 23  23  26    Calcium 8.9 - 10.3 mg/dL 9.6  9.4  9.3    No results found.  Family discussion: Palliative on board, discussed with family agreed home with hospice.  Disposition: Status is: inpatient   Planned Discharge Destination: Skilled nursing facility     Time spent: 36  minutes  Author: Concepcion Riser, MD 08/31/2023 11:55 AM Secure chat 7am to 7pm For on call review [email or web address].

## 2023-08-31 NOTE — Progress Notes (Signed)
 Patient is alert and oriented X 1. Got a call from [facility],tele showed 3-4 sec paused.Md made aware. No any other symptoms noted.

## 2023-08-31 NOTE — Progress Notes (Signed)
 [facility] LIAISON NOTE   Received request from [facility], Transitions of Care Manager, for hospice services at home after discharge. Spoke with Elsie Holiday, patient's son, to initiate education related to hospice philosophy, services, and team approach to care. Elsie verbalized understanding of information given. Per discussion, the plan is for discharge home by EMS or private vehicle once DME can be delivered.  Family needs time to clean a room/space out for patient/hospital bed and equipment.  Patient was present for discussion, but slept and has advanced dementia.  DME needs discussed.  Patient has the following equipment in the home:  none  Patient/family requests the following equipment for delivery:  hospital bed, over bed table, transport wheelchair, hoyer lift and BSC        The address has been verified and is correct in the chart.  Elsie Holiday, son, is the family contact to arrange time of equipment delivery.  Please send signed and completed DNR home with patient/family if applicable.   Please provide prescriptions at discharge as needed to ensure ongoing symptom management.  [facility] information and contact numbers given to [employer].  Above information shared with Odella Ku, Transitions of Care Manager and hospital medical team.  Please call with any hospice related questions or concerns.  Thank you for the opportunity to participate in this patient's care.  Saddie HILARIO Na, RN Nurse Liaison [phone]

## 2023-09-01 DIAGNOSIS — G9341 Metabolic encephalopathy: Secondary | ICD-10-CM | POA: Diagnosis not present

## 2023-09-01 DIAGNOSIS — Z515 Encounter for palliative care: Secondary | ICD-10-CM

## 2023-09-01 DIAGNOSIS — I160 Hypertensive urgency: Secondary | ICD-10-CM | POA: Diagnosis not present

## 2023-09-01 DIAGNOSIS — R001 Bradycardia, unspecified: Secondary | ICD-10-CM | POA: Diagnosis not present

## 2023-09-01 DIAGNOSIS — I480 Paroxysmal atrial fibrillation: Secondary | ICD-10-CM | POA: Diagnosis not present

## 2023-09-01 LAB — GLUCOSE, CAPILLARY
Glucose-Capillary: 124 mg/dL — ABNORMAL HIGH (ref 70–99)
Glucose-Capillary: 60 mg/dL — ABNORMAL LOW (ref 70–99)

## 2023-09-01 MED ORDER — POLYVINYL ALCOHOL 1.4 % OP SOLN: 1.0000 [drp] | Freq: Four times a day (QID) | OPHTHALMIC | Status: DC | PRN

## 2023-09-01 MED ORDER — DEXTROSE 50 % IV SOLN
12.5000 g | Freq: Once | INTRAVENOUS | Status: AC
  Administered 2023-09-01: 12.5 g via INTRAVENOUS
  Filled 2023-09-01: qty 50

## 2023-09-01 MED ORDER — MORPHINE SULFATE (PF) 2 MG/ML IV SOLN: 2.0000 mg | INTRAVENOUS | Status: DC | PRN

## 2023-09-01 MED ORDER — HALOPERIDOL 0.5 MG PO TABS: 0.5000 mg | ORAL_TABLET | ORAL | Status: DC | PRN

## 2023-09-01 MED ORDER — GLYCOPYRROLATE 0.2 MG/ML IJ SOLN: 0.2000 mg | INTRAMUSCULAR | Status: DC | PRN

## 2023-09-01 MED ORDER — GLYCOPYRROLATE 1 MG PO TABS: 1.0000 mg | ORAL_TABLET | ORAL | Status: DC | PRN

## 2023-09-01 MED ORDER — LORAZEPAM 2 MG/ML IJ SOLN: 1.0000 mg | INTRAMUSCULAR | Status: DC | PRN

## 2023-09-01 MED ORDER — HALOPERIDOL LACTATE 5 MG/ML IJ SOLN: 0.5000 mg | INTRAMUSCULAR | Status: DC | PRN

## 2023-09-01 MED ORDER — HALOPERIDOL LACTATE 2 MG/ML PO CONC: 0.5000 mg | ORAL | Status: DC | PRN

## 2023-09-01 MED ORDER — BIOTENE DRY MOUTH MT LIQD: 15.0000 mL | OROMUCOSAL | Status: DC | PRN

## 2023-09-01 NOTE — Progress Notes (Signed)
 Progress Note   Patient: Nicole Bishop FMW:[id number] DOB: 06-Jun-1931 DOA: 08/27/2023     2 DOS: the patient was seen and examined on 09/01/2023   Brief hospital course: Nicole Bishop is a 89 y.o. African-American  female with medical history significant for anxiety, depression, GERD, hypertension, hypothyroidism, questionable history of atrial fibrillation, dementia, and coronary artery disease as well as OSA, who presented to the emergency room with acute onset of altered mental status with elevated blood pressure.   Admitted to hospitalist service for metabolic encephalopathy, HTN urgency. Cardiology consulted for h/o Afib, toprol  XL stopped as HR lower side. She is eating poor, seen by SLP advised pureed diet. She is at high risk of aspiration. Palliative discussed with family who agreed with home with hospice care.  09/01/2023-I discussed with patient's son over phone, regarding her poor mental status, low blood sugars.  He agreed with comfort care only.  Orders for end-of-life, comfort measures placed.  Assessment and Plan: * Acute metabolic encephalopathy Recent admission for aspiration pneumonia. Infectious etiology negative so far. Possible worsening dementia, delirium. Her mental status today worrisome. More sleepy and lethargic. SLP advised dysphagia 1 diet, meds crushed in puree. Assist with feeding once more awake. Patient's son agreed with comfort measures only.  Orders placed.  Bradycardia with pauses: Hypertensive urgency BP lower side, hold Norvasc . Metoprolol  stopped due to slow HR. IV hydralazine  as needed for elevated BP.  Paroxysmal atrial fibrillation (HCC) Stop Xarelto , now that she is comfort only. Discontinued telemetry monitoring.  CKD stage 3a Kidney function stable. Avoid nephrotoxic drugs.  Pancytopenia Unknown etiology. Stable counts. Possibly in the setting of poor nutrition. All supplements stopped due to her being comfort care. Encourage oral  diet, supplements if awake.    Comfort measure only. Morphine , Ativan  PRN. Continue antiemetics, Haldol , Robinul  PRN.   Code Status: Limited: Do not attempt resuscitation (DNR) -DNR-LIMITED -Do Not Intubate/DNI  Palliative on board - Family wished for hospice upon discharge. Hospice DME being arranged.   Subjective: Patient is seen and examined today morning. She is sleepy, lethargic, not eating. Blood sugars lower side. Discussed wit hson, who agreed with comfort care only.  Physical Exam: Vitals:   08/31/23 2238 09/01/23 0401 09/01/23 0759 09/01/23 0814  BP: (!) 142/96 (!) 107/91 90/72   Pulse: 66 (!) 49 (!) 151 62  Resp: 18 17 18    Temp:   97.8 F (36.6 C)   TempSrc:      SpO2: 100% 99% 99%   Weight:      Height:        General - Elderly Caucasian female, no apparent distress HEENT - PERRLA, EOMI, atraumatic head, non tender sinuses. Lung - Clear, diffuse rales, rhonchi, no wheezes. Heart - S1, S2 heard, no murmurs, rubs, 1+  pedal edema. Abdomen - Soft, non tender, bowel sounds good Neuro - Alert, awake, confused, moving extremities. Skin - Warm and dry.  Data Reviewed:      Latest Ref Rng & Units 08/29/2023    5:21 AM 08/28/2023    4:54 AM 08/27/2023    5:30 PM  CBC  WBC 4.0 - 10.5 K/uL 1.6  1.6  1.8   Hemoglobin 12.0 - 15.0 g/dL 89.3  89.0  89.3   Hematocrit 36.0 - 46.0 % 33.8  34.5  34.5   Platelets 150 - 400 K/uL 81  87  101       Latest Ref Rng & Units 08/31/2023    5:03 AM 08/29/2023  5:21 AM 08/28/2023    4:54 AM  BMP  Glucose 70 - 99 mg/dL 66  891  74   BUN 8 - 23 mg/dL 23  20  21    Creatinine 0.44 - 1.00 mg/dL 8.80  8.86  8.75   Sodium 135 - 145 mmol/L 139  144  144   Potassium 3.5 - 5.1 mmol/L 3.4  3.4  3.8   Chloride 98 - 111 mmol/L 109  111  109   CO2 22 - 32 mmol/L 23  23  26    Calcium 8.9 - 10.3 mg/dL 9.6  9.4  9.3    No results found.  Family discussion: I discussed with son, agreed with comfort measures only. Palliative on board,  discussed with family who agreed for home with hospice.  Disposition: Status is: inpatient for palliative care.   Planned Discharge Destination:  Home with hospice     Time spent: 42 minutes  Author: Concepcion Riser, MD 09/01/2023 2:14 PM Secure chat 7am to 7pm For on call review [email or web address].

## 2023-09-01 NOTE — Progress Notes (Signed)
 [facility] Liaison Note  Follow up on new referral for hospice at home after discharge from [facility].  Yesterday when meeting with patient's son, Emmaleah Meroney, we discussed DME needs.  Elsie said he needed to get his siblings to help him clean out a room at his mother's apartment to be able to accept the DME.  He was hopeful to have it done so DME could be delivered later this afternoon.  DME set to be delivered Tuesday between 10am-2pm.  Patient could discharge home that evening after confirmation of DME delivery or Wed morning.  Hospital medical team and Darrian Shoffner, TOC notified.  Hospital Liaison Team will continue to follow through final disposition.

## 2023-09-01 NOTE — Progress Notes (Signed)
 This patient was assessed throughout the night.  Eyes remain closed.  Aroused with tactile stimulation.  BG monitored per order.  Noted to have abnormal readings.  Patient does not have PIV in place.  Will not take any oral fluids/food.  MD and NP in house made aware.  Per shift change, this patient will be made comfort care, to be discharged to home on hospice.  Currently no additional orders placed in chart.  Patient will continue to be monitored by assigned nurse.  No additional medical interventions performed at this time.  Patient will continued to be monitored.

## 2023-09-01 NOTE — Progress Notes (Signed)
 Received order to administer 12.5mg  of D50%, via IVP.  RN able to initiate 22 g PIV to left lower forearm, x 1 attempt, using correct aseptic technique, with noted (+) blood return.  Flushes without difficulty. Patient noted to tolerate procedure without difficulty.   BG to be reassessed and documented in chart.

## 2023-09-02 DIAGNOSIS — G9341 Metabolic encephalopathy: Secondary | ICD-10-CM | POA: Diagnosis not present

## 2023-09-02 DIAGNOSIS — I160 Hypertensive urgency: Secondary | ICD-10-CM | POA: Diagnosis not present

## 2023-09-02 DIAGNOSIS — D61818 Other pancytopenia: Secondary | ICD-10-CM | POA: Diagnosis not present

## 2023-09-02 DIAGNOSIS — I480 Paroxysmal atrial fibrillation: Secondary | ICD-10-CM | POA: Diagnosis not present

## 2023-09-02 MED ORDER — ONDANSETRON 4 MG PO TBDP
4.0000 mg | ORAL_TABLET | Freq: Three times a day (TID) | ORAL | 0 refills | Status: DC | PRN
Start: 2023-09-02 — End: 2023-12-30

## 2023-09-02 MED ORDER — LORAZEPAM 0.5 MG PO TABS: 0.5000 mg | ORAL_TABLET | Freq: Four times a day (QID) | ORAL | 0 refills | Status: DC | PRN

## 2023-09-02 MED ORDER — GLYCOPYRROLATE 1 MG PO TABS: 1.0000 mg | ORAL_TABLET | ORAL | 0 refills | Status: DC | PRN

## 2023-09-02 MED ORDER — SENNOSIDES 8.6 MG PO TABS: 2.0000 | ORAL_TABLET | Freq: Two times a day (BID) | ORAL | 0 refills | Status: DC

## 2023-09-02 MED ORDER — MORPHINE SULFATE (CONCENTRATE) 20 MG/ML PO SOLN: 10.0000 mg | ORAL | 0 refills | Status: DC | PRN

## 2023-09-02 NOTE — Consult Note (Signed)
 [facility] Liaison Note  09/02/2023  CAYENNE BREAULT Nov 20, 1930 [id number]  Location: RN Hospital Liaison screened the patient remotely at [facility].  Insurance: [facility] HMO   REBEKA KIMBLE is a 89 y.o. female who is a Primary Care Patient of Sadie Manna, MD [facility]). The patient was screened for 30 day readmission hospitalization with noted medium risk score for unplanned readmission risk with 2 IP in 6 months.  The patient was assessed for potential Care Management service needs for post hospital transition for care coordination. Review of patient's electronic medical record reveals patient was admitted with Acute encephalopathy. Pt will discharge home with hospice services ([facility]). No needs for VBCI to address at this time.   VBCI Care Management/Population Health does not replace or interfere with any arrangements made by the Inpatient Transition of Care team.   For questions contact:   Olam Ku, RN, [facility] Liaison [place]   [facility], Population Health Office Hours MTWF  8:00 am-6:00 pm Direct Dial: [phone] mobile [phone] [Office toll free line] Office Hours are M-F 8:30 - 5 pm [email or web address]

## 2023-09-02 NOTE — TOC Progression Note (Signed)
 Transition of Care [facility]) - Progression Note    Patient Details  Name: Nicole Bishop [name] Date of Birth: March 19, 1931  Transition of Care [facility]) CM/SW Contact  Ladene Lady, LCSW Phone Number: 09/02/2023, 8:40 AM  Clinical Narrative:  DME to be delivered tomorrow between 10-2.     Expected Discharge Plan: Skilled Nursing Facility Barriers to Discharge: Continued Medical Work up  Expected Discharge Plan and Services     Post Acute Care Choice:  (TBD) Living arrangements for the past 2 months: Apartment                                       Social Determinants of Health (SDOH) Interventions SDOH Screenings   Food Insecurity: No Food Insecurity (08/29/2023)  Housing: Low Risk  (08/29/2023)  Transportation Needs: No Transportation Needs (08/29/2023)  Utilities: Not At Risk (08/29/2023)  Depression (PHQ2-9): Low Risk  (08/27/2018)  Financial Resource Strain: Low Risk  (04/11/2023)   Received from [facility]  Social Connections: Patient Unable To Answer (08/29/2023)  Tobacco Use: Low Risk  (08/01/2023)    Readmission Risk Interventions     No data to display

## 2023-09-02 NOTE — Plan of Care (Signed)
  Problem: Education: Goal: Knowledge of General Education information will improve Description: Including pain rating scale, medication(s)/side effects and non-pharmacologic comfort measures Outcome: Progressing   Problem: Health Behavior/Discharge Planning: Goal: Ability to manage health-related needs will improve Outcome: Progressing   Problem: Clinical Measurements: Goal: Ability to maintain clinical measurements within normal limits will improve Outcome: Progressing Goal: Will remain free from infection Outcome: Progressing Goal: Diagnostic test results will improve Outcome: Progressing Goal: Respiratory complications will improve Outcome: Progressing Goal: Cardiovascular complication will be avoided Outcome: Progressing   Problem: Activity: Goal: Risk for activity intolerance will decrease Outcome: Progressing   Problem: Nutrition: Goal: Adequate nutrition will be maintained Outcome: Progressing   Problem: Coping: Goal: Level of anxiety will decrease Outcome: Progressing   Problem: Elimination: Goal: Will not experience complications related to bowel motility Outcome: Progressing Goal: Will not experience complications related to urinary retention Outcome: Progressing   Problem: Pain Management: Goal: General experience of comfort will improve Outcome: Progressing   Problem: Safety: Goal: Ability to remain free from injury will improve Outcome: Progressing   Problem: Skin Integrity: Goal: Risk for impaired skin integrity will decrease Outcome: Progressing   Problem: Education: Goal: Knowledge of the prescribed therapeutic regimen will improve Outcome: Progressing   Problem: Coping: Goal: Ability to identify and develop effective coping behavior will improve Outcome: Progressing   Problem: Clinical Measurements: Goal: Quality of life will improve Outcome: Progressing   Problem: Respiratory: Goal: Verbalizations of increased ease of respirations  will increase Outcome: Progressing   Problem: Role Relationship: Goal: Family's ability to cope with current situation will improve Outcome: Progressing Goal: Ability to verbalize concerns, feelings, and thoughts to partner or family member will improve Outcome: Progressing   Problem: Pain Management: Goal: Satisfaction with pain management regimen will improve Outcome: Progressing

## 2023-09-02 NOTE — TOC Transition Note (Signed)
 Transition of Care [facility]) - Discharge Note   Patient Details  Name: Nicole Bishop [name] Date of Birth: [birth year]  [facility]) CM/SW Contact:  Ladene Lady, LCSW Phone Number: 09/02/2023, 1:05 PM   Clinical Narrative:   Pt has orders to discharge home with [facility] hospice. DME has been delivered. Medical necessity printed to the unit. Hospice to call for transport.     Final next level of care: Home w Hospice Care Barriers to Discharge: Barriers Resolved   Patient Goals and CMS Choice Patient states their goals for this hospitalization and ongoing recovery are:: go home with hospice CMS Medicare.gov Compare Post Acute Care list provided to:: Patient        Discharge Placement                Patient to be transferred to facility by: [facility]   Patient and family notified of of transfer: 09/02/23  Discharge Plan and Services Additional resources added to the After Visit Summary for       Post Acute Care Choice:  (TBD)                               Social Drivers of Health (SDOH) Interventions SDOH Screenings   Food Insecurity: No Food Insecurity (08/29/2023)  Housing: Low Risk  (08/29/2023)  Transportation Needs: No Transportation Needs (08/29/2023)  Utilities: Not At Risk (08/29/2023)  Depression (PHQ2-9): Low Risk  (08/27/2018)  Financial Resource Strain: Low Risk  (04/11/2023)   [facility]  Social Connections: Patient Unable To Answer (08/29/2023)  Tobacco Use: Low Risk  (08/01/2023)     Readmission Risk Interventions     No data to display

## 2023-09-02 NOTE — Discharge Summary (Signed)
 Physician Discharge Summary   Patient: Nicole Bishop [name] DOB: 11-27-1930  Admit date:     08/27/2023  Discharge date: 09/02/23  Discharge Physician: Concepcion Riser   PCP: Sadie Manna, MD   Recommendations at discharge:    Home with hospice care.  Discharge Diagnoses: Principal Problem:   Acute encephalopathy Active Problems:   Hypertensive urgency   Paroxysmal atrial fibrillation (HCC)   Depression   Pancytopenia (HCC)   Acute metabolic encephalopathy  Resolved Problems:   * No resolved hospital problems. *  Hospital Course: Nicole Bishop is a 89 y.o. African-American  female with medical history significant for anxiety, depression, GERD, hypertension, hypothyroidism, questionable history of atrial fibrillation, dementia, and coronary artery disease as well as OSA, who presented to the emergency room with acute onset of altered mental status with elevated blood pressure.    Admitted to hospitalist service for metabolic encephalopathy, HTN urgency. Cardiology consulted for h/o Afib, toprol  XL stopped as HR lower side. She is eating poor, seen by SLP advised pureed diet. She is at high risk of aspiration. Palliative discussed with family who agreed with home with hospice care.   09/01/2023-I discussed with patient's son over phone, regarding her poor mental status, low blood sugars.  He agreed with comfort care only.  Orders for end-of-life, comfort measures placed.  09/02/23 - discharge home with hospice care. DME  arranged.  Assessment and Plan: Acute metabolic encephalopathy Overall level of function deteriorating. She is eating poor, more lethargic. Family decided to take her home with hospice.  Bradycardia with pauses: Hypertensive urgency BP lower side, hold Norvasc . Metoprolol  stopped due to slow HR.   Paroxysmal atrial fibrillation (HCC) Stopped Xarelto , now that she is comfort only.   CKD stage 3a Stable. No active lab  draws.  Pancytopenia Unknown etiology.  Possibly in the setting of poor nutrition. All supplements stopped due to her being comfort care.   Comfort measure only. Morphine , Ativan  PRN prescribed. Continue antiemetics, Robinul  PRN.       Consultants: none Procedures performed: none  Disposition: Hospice care Diet recommendation:  Discharge Diet Orders (From admission, onward)     Start     Ordered   09/02/23 0000  Diet general       Comments: Pleasure feeds   09/02/23 1253           Dysphagia type 1 thin Liquid pleasure feeds. DISCHARGE MEDICATION: Allergies as of 09/02/2023       Reactions   Penicillins Rash, Other (See Comments)   Has patient had a PCN reaction causing immediate rash, facial/tongue/throat swelling, SOB or lightheadedness with hypotension: Unknown Has patient had a PCN reaction causing severe rash involving mucus membranes or skin necrosis: Unknown Has patient had a PCN reaction that required hospitalization: Unknown Has patient had a PCN reaction occurring within the last 10 years: Unknown If all of the above answers are NO, then may proceed with Cephalosporin use.   Nsaids Other (See Comments)   GI upset   Rosuvastatin Other (See Comments)   Atorvastatin Rash   Celebrex [celecoxib] Other (See Comments)   Constipation and stomach upset   Felodipine Nausea Only   Oxaprozin Nausea Only   Rofecoxib Other (See Comments)   GI upset   Simvastatin Rash        Medication List     STOP taking these medications    amLODipine  10 MG tablet Commonly known as: NORVASC    ascorbic acid 500 MG tablet Commonly known as:  VITAMIN C   calcitRIOL  0.25 MCG capsule Commonly known as: ROCALTROL    cromolyn  4 % ophthalmic solution Commonly known as: OPTICROM    docusate sodium  100 MG capsule Commonly known as: COLACE   Ferrous Sulfate  142 (45 Fe) MG Tbcr   hydrALAZINE  10 MG tablet Commonly known as: APRESOLINE    loratadine  10 MG tablet Commonly  known as: CLARITIN    metoprolol  succinate 25 MG 24 hr tablet Commonly known as: TOPROL -XL   rivaroxaban  20 MG Tabs tablet Commonly known as: XARELTO    Vitamin D3 50 MCG (2000 UT) capsule       TAKE these medications    acetaminophen  650 MG CR tablet Commonly known as: TYLENOL  Take 650 mg by mouth every 8 (eight) hours as needed for pain.   albuterol  (2.5 MG/3ML) 0.083% nebulizer solution Commonly known as: PROVENTIL  Take 3 mLs (2.5 mg total) by nebulization every 4 (four) hours as needed for wheezing or shortness of breath.   glycopyrrolate  1 MG tablet Commonly known as: ROBINUL  Take 1 tablet (1 mg total) by mouth every 4 (four) hours as needed (excessive secretions).   LORazepam  0.5 MG tablet Commonly known as: ATIVAN  Take 1 tablet (0.5 mg total) by mouth every 6 (six) hours as needed for anxiety. May crush, mix with water and give sublingually if needed.   morphine  20 MG/ML concentrated solution Commonly known as: ROXANOL Take 0.5 mLs (10 mg total) by mouth every 4 (four) hours as needed for severe pain (pain score 7-10). May give sublingually if needed.   olopatadine  0.1 % ophthalmic solution Commonly known as: PATANOL Place 1 drop into both eyes 2 (two) times daily as needed for allergies.   ondansetron  4 MG disintegrating tablet Commonly known as: ZOFRAN -ODT Take 1 tablet (4 mg total) by mouth every 8 (eight) hours as needed for nausea or vomiting.   polyethylene glycol 17 g packet Commonly known as: MIRALAX  / GLYCOLAX  Take 17 g by mouth daily.   senna 8.6 MG tablet Commonly known as: Senokot Take 2 tablets (17.2 mg total) by mouth 2 (two) times daily. May crush, mix with water and give sublingually if needed.   traZODone  50 MG tablet Commonly known as: DESYREL  Take 50 mg by mouth at bedtime.        Follow-up Information     Paraschos, Alexander, MD. Go in 1 week(s).   Specialty: Cardiology Contact information: [address] [facility] [place] [address] [phone]                Discharge Exam: Fredricka Weights   08/27/23 1525 08/29/23 0200  Weight: 94.1 kg 96.2 kg   General - Elderly Caucasian ill female, sleeping comfortably.  HEENT - PERRLA, EOMI, atraumatic head, non tender sinuses. Lung - Clear, diffuse rales, rhonchi, no wheezes. Heart - S1, S2 heard, no murmurs, rubs, 1+  pedal edema. Abdomen - Soft, non tender, bowel sounds good Neuro - sleeping, unable to do full neuro exam. Skin - Warm and dry.  Condition at discharge: poor  The results of significant diagnostics from this hospitalization (including imaging, microbiology, ancillary and laboratory) are listed below for reference.   Imaging Studies: CT Head Wo Contrast Result Date: 08/27/2023 CLINICAL DATA:  Provided history: Altered mental status, non-traumatic. Additional history provided: High blood pressure, low heart rate, history of dementia. EXAM: CT HEAD WITHOUT CONTRAST TECHNIQUE: Contiguous axial images were obtained from the base of the skull through the vertex without intravenous contrast. RADIATION DOSE REDUCTION: This exam was  performed according to the departmental dose-optimization program which includes automated exposure control, adjustment of the mA and/or kV according to patient size and/or use of iterative reconstruction technique. COMPARISON:  Brain MRI 1214.24.  Head CT 08/02/2023. FINDINGS: Brain: Generalized cerebral atrophy. Suspected small synechiae within the anterior bodies of both lateral ventricles, better appreciated on the prior brain MRI of 08/02/2023. 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 There is no acute intracranial hemorrhage. No demarcated cortical infarct. No extra-axial fluid collection. No evidence of an intracranial mass. No midline shift. Vascular: No hyperdense vessel.  Atherosclerotic calcifications. Skull: No calvarial fracture or aggressive osseous  lesion. Sinuses/Orbits: No mass or acute finding within the imaged orbits. Mild mucosal thickening within the right frontal, bilateral ethmoid and bilateral maxillary sinuses. IMPRESSION: 1.  No evidence of an acute intracranial abnormality. 2. 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 3. Generalized cerebral atrophy. 4. Mild paranasal sinus mucosal thickening. Electronically Signed   By: Rockey Childs D.O.   On: 08/27/2023 18:43   DG Chest 1 View Result Date: 08/27/2023 CLINICAL DATA:  Lethargy EXAM: PORTABLE CHEST 1 VIEW COMPARISON:  08/02/2023 FINDINGS: Cardiac shadow is stable. Tortuous thoracic aorta is noted. Left lung remains clear. Patient is significantly rotated to the right accentuating the mediastinal markings on the right. Mild right basilar atelectasis is noted. No pneumothorax is seen. No bony abnormality is noted. IMPRESSION: Mild right basilar atelectasis. Electronically Signed   By: Oneil Devonshire M.D.   On: 08/27/2023 18:25    Microbiology: Results for orders placed or performed during the hospital encounter of 08/27/23  Resp panel by RT-PCR (RSV, Flu A&B, Covid) Anterior Nasal Swab     Status: None   Collection Time: 08/27/23  5:30 PM   Specimen: Anterior Nasal Swab  Result Value Ref Range Status   SARS Coronavirus 2 by RT PCR NEGATIVE NEGATIVE Final    Comment: (NOTE) SARS-CoV-2 target nucleic acids are NOT DETECTED.  The SARS-CoV-2 RNA is generally detectable in upper respiratory specimens during the acute phase of infection. The lowest concentration of SARS-CoV-2 viral copies this assay can detect is 138 copies/mL. A negative result does not preclude SARS-Cov-2 infection and should not be used as the sole basis for treatment or other patient management decisions. A negative result may occur with  improper specimen collection/handling, submission of specimen other than nasopharyngeal swab, presence of viral mutation(s) within the areas targeted by  this assay, and inadequate number of viral copies(<138 copies/mL). A negative result must be combined with clinical observations, patient history, and epidemiological information. The expected result is Negative.  Fact Sheet for Patients:  bloggercourse.com  Fact Sheet for Healthcare Providers:  seriousbroker.it  This test is no t yet approved or cleared by the [place]  FDA and  has been authorized for detection and/or diagnosis of SARS-CoV-2 by FDA under an Emergency Use Authorization (EUA). This EUA will remain  in effect (meaning this test can be used) for the duration of the COVID-19 declaration under Section 564(b)(1) of the Act, 21 U.S.C.section 360bbb-3(b)(1), unless the authorization is terminated  or revoked sooner.       Influenza A by PCR NEGATIVE NEGATIVE Final   Influenza B by PCR NEGATIVE NEGATIVE Final    Comment: (NOTE) The Xpert Xpress SARS-CoV-2/FLU/RSV plus assay is intended as an aid in the diagnosis of influenza from Nasopharyngeal swab specimens and should not be used as a sole basis for treatment. Nasal washings and aspirates are unacceptable for  Xpert Xpress SARS-CoV-2/FLU/RSV testing.  Fact Sheet for Patients: bloggercourse.com  Fact Sheet for Healthcare Providers: seriousbroker.it  This test is not yet approved or cleared by the [place]  FDA and has been authorized for detection and/or diagnosis of SARS-CoV-2 by FDA under an Emergency Use Authorization (EUA). This EUA will remain in effect (meaning this test can be used) for the duration of the COVID-19 declaration under Section 564(b)(1) of the Act, 21 U.S.C. section 360bbb-3(b)(1), unless the authorization is terminated or revoked.     Resp Syncytial Virus by PCR NEGATIVE NEGATIVE Final    Comment: (NOTE) Fact Sheet for Patients: bloggercourse.com  Fact Sheet  for Healthcare Providers: seriousbroker.it  This test is not yet approved or cleared by the [place]  FDA and has been authorized for detection and/or diagnosis of SARS-CoV-2 by FDA under an Emergency Use Authorization (EUA). This EUA will remain in effect (meaning this test can be used) for the duration of the COVID-19 declaration under Section 564(b)(1) of the Act, 21 U.S.C. section 360bbb-3(b)(1), unless the authorization is terminated or revoked.  Performed at [facility], [address] [place], [place] [address]   MRSA Next Gen by PCR, Nasal     Status: None   Collection Time: 08/29/23  4:35 AM   Specimen: Nasal Mucosa; Nasal Swab  Result Value Ref Range Status   MRSA by PCR Next Gen NOT DETECTED NOT DETECTED Final    Comment: (NOTE) The GeneXpert MRSA Assay (FDA approved for NASAL specimens only), is one component of a comprehensive MRSA colonization surveillance program. It is not intended to diagnose MRSA infection nor to guide or monitor treatment for MRSA infections. Test performance is not FDA approved in patients less than 20 years old. Performed at [facility], [address] [place], [place] [address]     Labs: CBC: Recent Labs  Lab 08/27/23 1730 08/28/23 0454 08/29/23 0521  WBC 1.8* 1.6* 1.6*  NEUTROABS  --  0.8*  --   HGB 10.6* 10.9* 10.6*  HCT 34.5* 34.5* 33.8*  MCV 91.3 90.3 88.7  PLT 101* 87* 81*   Basic Metabolic Panel: Recent Labs  Lab 08/27/23 1730 08/28/23 0454 08/29/23 0521 08/31/23 0503  NA 145 144 144 139  K 4.3 3.8 3.4* 3.4*  CL 109 109 111 109  CO2 23 26 23 23   GLUCOSE 72 74 108* 66*  BUN 21 21 20 23   CREATININE 1.23* 1.24* 1.13* 1.19*  CALCIUM 9.5 9.3 9.4 9.6   Liver Function Tests: Recent Labs  Lab 08/27/23 1730  AST 22  ALT 22  ALKPHOS 104  BILITOT 0.5  PROT 6.4*  ALBUMIN 2.9*   CBG: Recent Labs  Lab 08/31/23 1128 08/31/23 1551 08/31/23 2002 09/01/23 0056  09/01/23 0653  GLUCAP 97 79 62* 60* 124*    Discharge time spent: 36 minutes.  Signed: Concepcion Riser, MD [facility] 09/02/2023

## 2023-09-02 NOTE — Care Management Important Message (Signed)
 Important Message  Patient Details  Name: Nicole Bishop MRN: [id number] Date of Birth: October 25, 1930   Important Message Given:  Yes - Medicare IM     Janet Decesare W, CMA 09/02/2023, 9:15 AM

## 2023-09-20 DEATH — deceased

## 2024-01-07 ENCOUNTER — Ambulatory Visit: Payer: Medicare HMO | Admitting: Internal Medicine

## 2024-01-07 ENCOUNTER — Ambulatory Visit: Payer: Medicare HMO

## 2024-01-07 ENCOUNTER — Other Ambulatory Visit: Payer: Medicare HMO
